# Patient Record
Sex: Female | Born: 1975 | Race: White | Hispanic: No | Smoking: Former smoker
Health system: Southern US, Community
[De-identification: ages and names within clinical notes are randomized; demographics above are authoritative.]

## PROBLEM LIST (undated history)

## (undated) DIAGNOSIS — G4730 Sleep apnea, unspecified: Secondary | ICD-10-CM

## (undated) DIAGNOSIS — M549 Dorsalgia, unspecified: Secondary | ICD-10-CM

## (undated) DIAGNOSIS — F32A Depression, unspecified: Secondary | ICD-10-CM

## (undated) DIAGNOSIS — M419 Scoliosis, unspecified: Secondary | ICD-10-CM

## (undated) DIAGNOSIS — L309 Dermatitis, unspecified: Secondary | ICD-10-CM

## (undated) DIAGNOSIS — F319 Bipolar disorder, unspecified: Secondary | ICD-10-CM

## (undated) DIAGNOSIS — F603 Borderline personality disorder: Secondary | ICD-10-CM

## (undated) DIAGNOSIS — K589 Irritable bowel syndrome without diarrhea: Secondary | ICD-10-CM

## (undated) DIAGNOSIS — J439 Emphysema, unspecified: Secondary | ICD-10-CM

## (undated) DIAGNOSIS — M1990 Unspecified osteoarthritis, unspecified site: Secondary | ICD-10-CM

## (undated) DIAGNOSIS — M51369 Other intervertebral disc degeneration, lumbar region without mention of lumbar back pain or lower extremity pain: Secondary | ICD-10-CM

## (undated) DIAGNOSIS — K121 Other forms of stomatitis: Secondary | ICD-10-CM

## (undated) DIAGNOSIS — E079 Disorder of thyroid, unspecified: Secondary | ICD-10-CM

## (undated) DIAGNOSIS — M5136 Other intervertebral disc degeneration, lumbar region: Secondary | ICD-10-CM

## (undated) DIAGNOSIS — K2970 Gastritis, unspecified, without bleeding: Secondary | ICD-10-CM

## (undated) DIAGNOSIS — F419 Anxiety disorder, unspecified: Secondary | ICD-10-CM

## (undated) DIAGNOSIS — E538 Deficiency of other specified B group vitamins: Secondary | ICD-10-CM

## (undated) DIAGNOSIS — I10 Essential (primary) hypertension: Secondary | ICD-10-CM

## (undated) DIAGNOSIS — K219 Gastro-esophageal reflux disease without esophagitis: Secondary | ICD-10-CM

## (undated) DIAGNOSIS — M722 Plantar fascial fibromatosis: Secondary | ICD-10-CM

## (undated) DIAGNOSIS — G5600 Carpal tunnel syndrome, unspecified upper limb: Secondary | ICD-10-CM

## (undated) DIAGNOSIS — D649 Anemia, unspecified: Secondary | ICD-10-CM

## (undated) DIAGNOSIS — M542 Cervicalgia: Secondary | ICD-10-CM

## (undated) DIAGNOSIS — Z8719 Personal history of other diseases of the digestive system: Secondary | ICD-10-CM

## (undated) DIAGNOSIS — E039 Hypothyroidism, unspecified: Secondary | ICD-10-CM

## (undated) DIAGNOSIS — F329 Major depressive disorder, single episode, unspecified: Secondary | ICD-10-CM

## (undated) DIAGNOSIS — N393 Stress incontinence (female) (male): Secondary | ICD-10-CM

## (undated) HISTORY — DX: Essential (primary) hypertension: I10

## (undated) HISTORY — PX: INTRAUTERINE DEVICE (IUD) INSERTION: SHX5877

## (undated) HISTORY — DX: Gastro-esophageal reflux disease without esophagitis: K21.9

## (undated) HISTORY — DX: Depression, unspecified: F32.A

## (undated) HISTORY — DX: Anemia, unspecified: D64.9

## (undated) HISTORY — DX: Other intervertebral disc degeneration, lumbar region without mention of lumbar back pain or lower extremity pain: M51.369

## (undated) HISTORY — PX: TUBAL LIGATION: SHX77

## (undated) HISTORY — DX: Other intervertebral disc degeneration, lumbar region: M51.36

## (undated) HISTORY — DX: Plantar fascial fibromatosis: M72.2

## (undated) HISTORY — DX: Major depressive disorder, single episode, unspecified: F32.9

## (undated) HISTORY — PX: FOOT SURGERY: SHX648

## (undated) HISTORY — DX: Emphysema, unspecified: J43.9

## (undated) HISTORY — DX: Unspecified osteoarthritis, unspecified site: M19.90

## (undated) HISTORY — PX: HIP FRACTURE SURGERY: SHX118

## (undated) HISTORY — DX: Disorder of thyroid, unspecified: E07.9

---

## 2004-04-17 ENCOUNTER — Emergency Department: Payer: Self-pay | Admitting: Emergency Medicine

## 2004-04-25 ENCOUNTER — Emergency Department: Payer: Self-pay | Admitting: Unknown Physician Specialty

## 2004-06-04 ENCOUNTER — Emergency Department: Payer: Self-pay | Admitting: Emergency Medicine

## 2004-09-12 ENCOUNTER — Emergency Department: Payer: Self-pay | Admitting: General Practice

## 2005-09-06 HISTORY — PX: WISDOM TOOTH EXTRACTION: SHX21

## 2006-05-27 ENCOUNTER — Emergency Department: Payer: Self-pay | Admitting: Emergency Medicine

## 2006-11-26 ENCOUNTER — Other Ambulatory Visit: Payer: Self-pay

## 2006-11-27 ENCOUNTER — Inpatient Hospital Stay: Payer: Self-pay | Admitting: Internal Medicine

## 2006-11-28 ENCOUNTER — Inpatient Hospital Stay: Payer: Self-pay | Admitting: Psychiatry

## 2007-01-29 ENCOUNTER — Emergency Department: Payer: Self-pay | Admitting: Internal Medicine

## 2007-03-21 ENCOUNTER — Emergency Department: Payer: Self-pay | Admitting: Emergency Medicine

## 2007-05-26 ENCOUNTER — Ambulatory Visit: Payer: Self-pay

## 2007-10-06 ENCOUNTER — Encounter: Payer: Self-pay | Admitting: Podiatry

## 2007-10-07 ENCOUNTER — Encounter: Payer: Self-pay | Admitting: Podiatry

## 2007-11-07 ENCOUNTER — Encounter: Payer: Self-pay | Admitting: Podiatry

## 2007-12-07 ENCOUNTER — Encounter: Payer: Self-pay | Admitting: Podiatry

## 2009-07-11 ENCOUNTER — Emergency Department: Payer: Self-pay | Admitting: Unknown Physician Specialty

## 2009-09-02 ENCOUNTER — Other Ambulatory Visit: Payer: Self-pay | Admitting: Family Medicine

## 2010-06-04 ENCOUNTER — Encounter: Payer: Self-pay | Admitting: Podiatry

## 2010-06-07 ENCOUNTER — Encounter: Payer: Self-pay | Admitting: Podiatry

## 2010-07-07 ENCOUNTER — Encounter: Payer: Self-pay | Admitting: Podiatry

## 2010-08-07 ENCOUNTER — Encounter: Payer: Self-pay | Admitting: Podiatry

## 2010-09-07 ENCOUNTER — Encounter: Payer: Self-pay | Admitting: Podiatry

## 2010-10-15 ENCOUNTER — Ambulatory Visit: Payer: Self-pay | Admitting: Pain Medicine

## 2010-10-21 ENCOUNTER — Ambulatory Visit: Payer: Self-pay | Admitting: Pain Medicine

## 2010-10-23 ENCOUNTER — Ambulatory Visit: Payer: Self-pay | Admitting: Pain Medicine

## 2010-11-04 ENCOUNTER — Ambulatory Visit: Payer: Self-pay | Admitting: Pain Medicine

## 2010-11-14 ENCOUNTER — Ambulatory Visit: Payer: Self-pay | Admitting: Pain Medicine

## 2010-11-25 ENCOUNTER — Ambulatory Visit: Payer: Self-pay | Admitting: Pain Medicine

## 2010-12-11 ENCOUNTER — Ambulatory Visit: Payer: Self-pay | Admitting: Pain Medicine

## 2010-12-23 ENCOUNTER — Ambulatory Visit: Payer: Self-pay | Admitting: Pain Medicine

## 2011-01-27 ENCOUNTER — Ambulatory Visit: Payer: Self-pay | Admitting: Pain Medicine

## 2011-02-05 ENCOUNTER — Ambulatory Visit: Payer: Self-pay | Admitting: Pain Medicine

## 2011-02-24 ENCOUNTER — Ambulatory Visit: Payer: Self-pay | Admitting: Pain Medicine

## 2011-12-22 ENCOUNTER — Inpatient Hospital Stay: Payer: Self-pay | Admitting: Psychiatry

## 2011-12-22 LAB — URINALYSIS, COMPLETE
Bilirubin,UR: NEGATIVE
Blood: NEGATIVE
Glucose,UR: NEGATIVE mg/dL (ref 0–75)
Granular Cast: 20
Hyaline Cast: 1
Nitrite: NEGATIVE
Ph: 5 (ref 4.5–8.0)
Protein: 30
RBC,UR: 1 /HPF (ref 0–5)
Specific Gravity: 1.01 (ref 1.003–1.030)
Squamous Epithelial: 5
WBC UR: 4 /HPF (ref 0–5)

## 2011-12-22 LAB — ETHANOL
Ethanol %: <0.003 % (ref 0.000–0.080)
Ethanol: <3 mg/dL

## 2011-12-22 LAB — COMPREHENSIVE METABOLIC PANEL
Albumin: 4.5 g/dL (ref 3.4–5.0)
Alkaline Phosphatase: 101 U/L (ref 50–136)
Anion Gap: 6 — ABNORMAL LOW (ref 7–16)
BUN: 6 mg/dL — ABNORMAL LOW (ref 7–18)
Bilirubin,Total: 0.8 mg/dL (ref 0.2–1.0)
Calcium, Total: 9.6 mg/dL (ref 8.5–10.1)
Chloride: 101 mmol/L (ref 98–107)
Co2: 29 mmol/L (ref 21–32)
Creatinine: 0.83 mg/dL (ref 0.60–1.30)
EGFR (African American): >60
EGFR (Non-African Amer.): >60
Glucose: 90 mg/dL (ref 65–99)
Osmolality: 269 (ref 275–301)
Potassium: 4.5 mmol/L (ref 3.5–5.1)
SGOT(AST): 27 U/L (ref 15–37)
SGPT (ALT): 19 U/L (ref 12–78)
Sodium: 136 mmol/L (ref 136–145)
Total Protein: 8.2 g/dL (ref 6.4–8.2)

## 2011-12-22 LAB — DRUG SCREEN, URINE
Amphetamines, Ur Screen: POSITIVE (ref ?–1000)
Barbiturates, Ur Screen: NEGATIVE (ref ?–200)
Benzodiazepine, Ur Scrn: NEGATIVE (ref ?–200)
Cannabinoid 50 Ng, Ur ~~LOC~~: NEGATIVE (ref ?–50)
Cocaine Metabolite,Ur ~~LOC~~: NEGATIVE (ref ?–300)
MDMA (Ecstasy)Ur Screen: POSITIVE (ref ?–500)
Methadone, Ur Screen: NEGATIVE (ref ?–300)
Opiate, Ur Screen: NEGATIVE (ref ?–300)
Phencyclidine (PCP) Ur S: NEGATIVE (ref ?–25)
Tricyclic, Ur Screen: NEGATIVE (ref ?–1000)

## 2011-12-22 LAB — CBC
HCT: 41.8 % (ref 35.0–47.0)
HGB: 14.5 g/dL (ref 12.0–16.0)
MCH: 37.1 pg — ABNORMAL HIGH (ref 26.0–34.0)
MCHC: 34.8 g/dL (ref 32.0–36.0)
MCV: 107 fL — ABNORMAL HIGH (ref 80–100)
Platelet: 396 10*3/uL (ref 150–440)
RBC: 3.92 10*6/uL (ref 3.80–5.20)
RDW: 17.1 % — ABNORMAL HIGH (ref 11.5–14.5)
WBC: 10.5 10*3/uL (ref 3.6–11.0)

## 2011-12-22 LAB — TSH: Thyroid Stimulating Horm: 1.53 u[IU]/mL

## 2012-08-17 ENCOUNTER — Ambulatory Visit: Payer: Self-pay | Admitting: Pain Medicine

## 2012-10-28 ENCOUNTER — Emergency Department: Payer: Self-pay | Admitting: Internal Medicine

## 2012-11-17 ENCOUNTER — Ambulatory Visit: Payer: Self-pay | Admitting: Pain Medicine

## 2013-01-27 ENCOUNTER — Ambulatory Visit: Payer: Self-pay | Admitting: Pain Medicine

## 2013-02-02 ENCOUNTER — Ambulatory Visit: Payer: Self-pay | Admitting: Pain Medicine

## 2013-03-08 ENCOUNTER — Ambulatory Visit: Payer: Self-pay | Admitting: Pain Medicine

## 2013-03-16 ENCOUNTER — Ambulatory Visit: Payer: Self-pay | Admitting: Pain Medicine

## 2013-04-19 ENCOUNTER — Ambulatory Visit: Payer: Self-pay | Admitting: Pain Medicine

## 2013-04-23 ENCOUNTER — Emergency Department: Payer: Self-pay | Admitting: Emergency Medicine

## 2013-05-04 ENCOUNTER — Ambulatory Visit: Payer: Self-pay | Admitting: Pain Medicine

## 2013-05-31 ENCOUNTER — Ambulatory Visit: Payer: Self-pay | Admitting: Pain Medicine

## 2013-12-19 ENCOUNTER — Ambulatory Visit: Payer: Self-pay | Admitting: Family Medicine

## 2014-01-02 DIAGNOSIS — N393 Stress incontinence (female) (male): Secondary | ICD-10-CM | POA: Diagnosis present

## 2014-03-18 ENCOUNTER — Ambulatory Visit: Payer: Self-pay | Admitting: Nurse Practitioner

## 2014-03-23 ENCOUNTER — Emergency Department: Payer: Self-pay | Admitting: Emergency Medicine

## 2014-03-30 ENCOUNTER — Inpatient Hospital Stay: Payer: Self-pay | Admitting: Surgery

## 2014-03-30 LAB — HCG, QUANTITATIVE, PREGNANCY: Beta Hcg, Quant.: <1 m[IU]/mL — ABNORMAL LOW

## 2014-03-30 LAB — BASIC METABOLIC PANEL
Anion Gap: 8 (ref 7–16)
BUN: 11 mg/dL
Calcium, Total: 8.8 mg/dL — ABNORMAL LOW
Chloride: 105 mmol/L
Co2: 27 mmol/L
Creatinine: 0.78 mg/dL
EGFR (African American): >60
EGFR (Non-African Amer.): >60
Glucose: 92 mg/dL
Potassium: 3.5 mmol/L
Sodium: 140 mmol/L

## 2014-03-30 LAB — SEDIMENTATION RATE: Erythrocyte Sed Rate: 28 mm/hr — ABNORMAL HIGH (ref 0–20)

## 2014-03-30 LAB — MAGNESIUM: Magnesium: 1.8 mg/dL

## 2014-03-31 LAB — BASIC METABOLIC PANEL WITH GFR
Anion Gap: 6 — ABNORMAL LOW
BUN: 8 mg/dL
Calcium, Total: 7.9 mg/dL — ABNORMAL LOW
Chloride: 107 mmol/L
Co2: 24 mmol/L
Creatinine: 0.68 mg/dL
EGFR (African American): >60
EGFR (Non-African Amer.): >60
Glucose: 123 mg/dL — ABNORMAL HIGH
Potassium: 4.3 mmol/L
Sodium: 137 mmol/L

## 2014-03-31 LAB — CBC WITH DIFFERENTIAL/PLATELET
Basophil #: 0 10*3/uL (ref 0.0–0.1)
Basophil %: 0.2 %
Eosinophil #: 0 10*3/uL (ref 0.0–0.7)
Eosinophil %: 0 %
HCT: 32.4 % — ABNORMAL LOW (ref 35.0–47.0)
HGB: 10.6 g/dL — ABNORMAL LOW (ref 12.0–16.0)
Lymphocyte #: 1 10*3/uL (ref 1.0–3.6)
Lymphocyte %: 9 %
MCH: 29.2 pg (ref 26.0–34.0)
MCHC: 32.8 g/dL (ref 32.0–36.0)
MCV: 89 fL (ref 80–100)
Monocyte #: 0.7 x10 3/mm (ref 0.2–0.9)
Monocyte %: 6.9 %
Neutrophil #: 9.1 10*3/uL — ABNORMAL HIGH (ref 1.4–6.5)
Neutrophil %: 83.9 %
Platelet: 374 10*3/uL (ref 150–440)
RBC: 3.64 10*6/uL — ABNORMAL LOW (ref 3.80–5.20)
RDW: 13.3 % (ref 11.5–14.5)
WBC: 10.8 10*3/uL (ref 3.6–11.0)

## 2014-04-01 LAB — CBC WITH DIFFERENTIAL/PLATELET
Basophil #: 0 10*3/uL (ref 0.0–0.1)
Basophil %: 0.5 %
Eosinophil #: 0.1 10*3/uL (ref 0.0–0.7)
Eosinophil %: 0.7 %
HCT: 30.1 % — ABNORMAL LOW (ref 35.0–47.0)
HGB: 9.7 g/dL — ABNORMAL LOW (ref 12.0–16.0)
Lymphocyte #: 2.9 10*3/uL (ref 1.0–3.6)
Lymphocyte %: 35.2 %
MCH: 28.7 pg (ref 26.0–34.0)
MCHC: 32.1 g/dL (ref 32.0–36.0)
MCV: 89 fL (ref 80–100)
Monocyte #: 0.8 x10 3/mm (ref 0.2–0.9)
Monocyte %: 10.2 %
Neutrophil #: 4.4 10*3/uL (ref 1.4–6.5)
Neutrophil %: 53.4 %
Platelet: 312 10*3/uL (ref 150–440)
RBC: 3.37 10*6/uL — ABNORMAL LOW (ref 3.80–5.20)
RDW: 13.4 % (ref 11.5–14.5)
WBC: 8.2 10*3/uL (ref 3.6–11.0)

## 2014-04-01 LAB — BASIC METABOLIC PANEL
Anion Gap: 2 — ABNORMAL LOW (ref 7–16)
BUN: 8 mg/dL
Calcium, Total: 7.9 mg/dL — ABNORMAL LOW
Chloride: 112 mmol/L — ABNORMAL HIGH
Co2: 27 mmol/L
Creatinine: 0.66 mg/dL
EGFR (African American): >60
EGFR (Non-African Amer.): >60
Glucose: 94 mg/dL
Potassium: 4.3 mmol/L
Sodium: 141 mmol/L

## 2014-04-02 LAB — HEMOGLOBIN: HGB: 9.4 g/dL — ABNORMAL LOW (ref 12.0–16.0)

## 2014-04-25 NOTE — H&P (Signed)
PATIENT NAME:  Kirsten Richardson, Kirsten Richardson MR#:  [id number] DATE OF BIRTH:  [birth year]  DATE OF ADMISSION:  12/22/2011  REFERRING PHYSICIAN:  Daryel November, MD   ATTENDING PHYSICIAN:  Kristine Linea, MD   IDENTIFYING DATA:  The patient is a 39 year old female with history of depression and chronic pain.   CHIEF COMPLAINT:  "My depression is worse."   HISTORY OF PRESENT ILLNESS:  The patient has been a patient at [facility]. She has been maintained on a combination of Wellbutrin and Cymbalta. She was doing fine, but in the past few months, she has learned that [facility] will close their outpatient services and she lost her trusted therapist and the psychiatrist. She became increasingly depressed. Three months ago, she reports overdosing on medication, but was not been hospitalized at that time. Over the past several weeks, she has been thinking about suicide frequently and decided to come to the hospital. The patient has multiple medical problems in addition to her psychiatric problems. Unfortunately, she lost Medicaid in March. She is separated from her husband and they have 50-50 custody of the children. Neither of the parents therefore qualifies for Medicaid. With the loss of Medicaid, she is no longer able to see her pain doctor. She has 2 kinds of pain, back pain from arthritis and disc change as well as plantar fasciitis for which she had surgery that went wrong and the patient has been in pain ever since and unable to function. In the past, she received the injections for her back pain and narcotic pain killers for feet pain. She has not seen her pain doctor in many months. It is unclear to me how she is able to pay for her multiple psychiatric medications. She reports poor sleep, decreased appetite, anhedonia, feeling of guilt, hopelessness, worthlessness, crying spells, irritability, poor memory and concentration, social isolation. She is chronically suicidal, but lately started thinking about  overdosing on pills. She denies alcohol, illicit drugs or prescription pill abuse. She denies psychotic symptoms or symptoms suggestive of bipolar mania.   PAST PSYCHIATRIC HISTORY:  She has not been hospitalized. There was 1 suicide attempt at the age of 35. No substance abuse treatments.   FAMILY PSYCHIATRIC HISTORY:  Mother and father with anxiety. Her nephew committed suicide.   ALLERGIES:  No known drug allergies.   MEDICATIONS ON ADMISSION:   1.  Wellbutrin 450 mg daily.  2.  Trazodone 100 mg at bedtime.  3.  Ritalin 10 mg 3 times daily.  4.  Risperdal 2 mg at bedtime.  5.  Lamictal XR 250 mg in the morning.  6.  Cymbalta 120 mg daily.   SOCIAL HISTORY:  She is separated from her husband. She lives by herself. Her brother pays the rent and the other family members pay her bills. She applied for disability and hopes to be independent one day. She has 2 sons, ages 65 and 83. She splits care of the children 50-50 with her husband. She has a Information systems manager. She dropped out of school in the ninth grade. She quit for drugs.   REVIEW OF SYSTEMS:  CONSTITUTIONAL: No fevers or chills. No weight changes. Positive for fatigue.  EYES: No double or blurred vision.  ENT: No hearing loss.  RESPIRATORY: No shortness of breath.  CARDIOVASCULAR: No chest pain or orthopnea.  GASTROINTESTINAL: Positive for abdominal pain, constipation and gas.  GENITOURINARY: No incontinence or frequency.  ENDOCRINE: No heat or cold intolerance.  LYMPHATIC: No anemia or easy bruising.  INTEGUMENTARY:  No acne or rash.  MUSCULOSKELETAL: Positive for feet and back pain.  NEUROLOGIC: No tingling or weakness.  PSYCHIATRIC: See history of present illness for details.   PHYSICAL EXAMINATION: VITAL SIGNS: Blood pressure 121/73, pulse 131, respirations 18, temperature 98.4.  GENERAL: This is a well-developed young female in no acute distress.  HEENT: The pupils are equal, round, and reactive to light. Sclerae are  anicteric.  NECK: Supple. No thyromegaly.  LUNGS: Clear to auscultation. No dullness to percussion.  HEART: Regular rhythm and rate. No murmurs, rubs, or gallops.  ABDOMEN: Soft, nontender, nondistended. Positive bowel sounds.  MUSCULOSKELETAL: Normal muscle strength in all extremities.  SKIN: No rashes or bruises.  LYMPHATIC: No cervical adenopathy.  NEUROLOGIC: Cranial nerves II through XII are intact.   DIAGNOSTIC DATA:  Chemistries are within normal limits. Blood alcohol level is 0. LFTs are within normal limits. TSH is 1.53. Urine drug screen is positive for amphetamines and MDMA. CBC is within normal limits. Urinalysis is not suggestive of urinary tract infection. EKG: Sinus tachycardia, normal EKG.   MENTAL STATUS EXAMINATION ON ADMISSION:  The patient is alert and oriented to person, place, time and situation. She is pleasant, polite and cooperative. There is psychomotor retardation. She is wearing hospital scrubs. She maintains some eye contact. Her speech is soft. Mood is depressed with flat affect. Thought processing is logical and goal oriented. Thought content: She denies suicidal or homicidal ideation, but was admitted for voicing suicidal ideation with a plan to overdose on medications. There are no delusions or paranoia. There are no auditory or visual hallucinations. Her cognition is grossly intact. She registers 3 out of 3 and recalls 3 out of 3 objects after 5 minutes. She can spell world forward and backward. She knows the current president. Her insight and judgment are fair.   SUICIDE RISK ASSESSMENT ON ADMISSION:  This is a patient with history of depression, but recently worsened due to multiple stressors and loss of her trusted providers who has a history of suicide attempt and chronic pain.   DIAGNOSES: AXIS I: Major depressive disorder, recurrent, severe.  AXIS II: Deferred.  AXIS III: Chronic pain.  AXIS IV: Mental illness, financial.  AXIS V: Global assessment of  functioning 25.   PLAN:  The patient was admitted to [facility] Medicine Unit for safety, stabilization and medication management. She was initially placed on suicide precautions and was closely monitored for any unsafe behaviors. She underwent full psychiatric and risk assessment. She received pharmacotherapy, individual and group psychotherapy, substance abuse counseling and support from therapeutic milieu.  1.  Suicidal ideation: This has resolved. The patient is able to contract for safety.  2.  Mood: We will continue medications as prescribed by Dr. Katrinka Blazing, Wellbutrin, Cymbalta and trazodone for sleep.  3.  ADHD: Diagnosis is unclear. I will not prescribed stimulants during current hospitalization.  4.  Chronic pain. We will not prescribe narcotic pain killers as the patient has no provider in the community or insurance. We will offer a tramadol.  5.  Disposition:  She will be discharged to home.    ____________________________ Ellin Goodie. Jennet Maduro, MD jbp:si D: 12/24/2011 20:36:00 ET T: 12/24/2011 22:25:48 ET JOB#: [id number]  cc: Calvyn Kurtzman B. Jennet Maduro, MD, <Dictator> Shari Prows MD ELECTRONICALLY SIGNED 12/26/2011 5:29

## 2014-05-01 LAB — SURGICAL PATHOLOGY

## 2014-05-07 NOTE — Op Note (Signed)
PATIENT NAME:  Kirsten Richardson, Kirsten Richardson MR#:  [id number] DATE OF BIRTH:  [birth year]  DATE OF PROCEDURE:  03/30/2014  PREOPERATIVE DIAGNOSIS: Impending stress fractures, medial femoral calcar regions, both hips.   POSTOPERATIVE DIAGNOSIS: Impending stress fractures, medial femoral calcar regions, both hips.    PROCEDURE: Prophylactic trochanteric femoral nailing of impending stress fractures, bilateral hips.   SURGEON: Maryagnes Amos, MD   ANESTHESIA: General endotracheal.   FINDINGS: As noted above.   COMPLICATIONS: None.   ESTIMATED BLOOD LOSS: 75 mL.   TOTAL FLUIDS: Crystalloid 1500 mL.  URINE OUTPUT: 300 mL.   TOURNIQUET: None.   DRAINS: None.   CLOSURE: Staples.   BRIEF CLINICAL NOTE: The patient is a 39 year old female who has had a 5-6 week history of progressively worsening right groin pain. Her symptoms have progressed despite medications, activity modification, etc. An MRI scan confirmed the presence of a stress reaction with impending fracture involving the medial calcar region of the right hip. The MRI also showed some increased uptake in the same region on the left side. Over the past week or so, she has had worsening pain in her left hip to where she is now unable to bear weight or ambulate, and has been confined to a wheelchair. She presents at this time for a bilateral prophylactic trochanter femoral nailing of  both hips.   PROCEDURE IN DETAIL: The patient was brought into the operating room and lain in the supine position. After adequate general endotracheal intubation and anesthesia were obtained, the patient was transferred to the fracture table and a Foley catheter inserted. The right side was approached first. The patient was positioned so that the left leg was placed in a flexed and abducted position over the well leg holder. The right lower extremity was placed in gentle longitudinal traction with some internal rotation to optimize position of the femur. The adequacy of  the proximal femoral position was verified fluoroscopically in AP and lateral projections and found to be excellent. The lateral aspect of the right hip and thigh was prepped with ChloraPrep solution before being draped sterilely. Preoperative antibiotics were administered. An approximately 3-4 cm incision was made over the lateral aspect of the hip approximately 3 fingerbreadths above the tip of the lesser trochanter. The incision was bluntly dissected down through the subcutaneous tissues to expose the gluteal fascia. This was split the length of the incision. The dissection was carried down further, splitting the muscles in line of their fibers. The deeper tendon was identified and bluntly penetrated to provide access to the tip of the greater trochanter. Under fluoroscopic guidance, the 2.5 mm drill was positioned and, again, after verifying its position fluoroscopically in AP and lateral projections, was inserted to just below the lesser trochanter. Once this position was, again, verified fluoroscopically in AP and lateral projections, it was overreamed with the proximal step reamer. The beaded guidewire was passed down through the femoral canal to rest within several centimeters of the distal femoral joint. The guidewire was measured to determine the optimal length for the nail. A 340 mm nail was selected. The canal was reamed sequentially beginning with a 9 mm flexible reamer. This was progressed by 0.5 mm increments to 10.5 mm. This provided excellent cortical chatter, so it was elected to proceed with placement of a 9 x 340 mm trochanteric femoral nail. This was impacted into place and advanced to the appropriate depth, as verified by the lag screw position. Once this position was verified on fluoroscopic imaging in  AP and lateral projections, the triple sleeve construct was placed through the guide, and advanced through the soft tissues via a 2-3 cm stab incision to rest along the lateral aspect of the  proximal femur. Again, once this position was verified fluoroscopically, the guidewire was drilled up through the femoral neck to rest within 5 mm of subchondral bone. Again, the guidewire position was verified fluoroscopically in AP and lateral projections and found to be excellent. The guide wire was measured and then overreamed to the appropriate depth of 85 mm. The hole was tapped appropriately before the 85 mm lag screw was inserted and advanced to the appropriate depth. The proximal locking screw was tightened and then backed off a quarter turn. The adequacy of the hardware position in the proximal femur, again, was verified fluoroscopically in AP and lateral projections and found to be excellent.   Attention was directed to the distal femur. The leg and fluoroscopic unit were positioned so that the static interlocking hole of the nail was nice and round. A short stab incision was made over this area and the drill placed over the lateral aspect of the distal femoral cortex. Once the optimal position was verified, the drill was advanced through the nail to exit through the medial cortex. A 36 mm screw was selected and inserted with care taken to be sure it was in the proper position, again, using fluoroscopy in AP and lateral projections.   Once this was verified, all wounds were copiously irrigated with sterile saline solution. The proximal wound was closed using #0 Vicryl interrupted sutures for the deeper subcutaneous tissues, whereas the superficial subcutaneous tissues were closed using 2-0 Vicryl interrupted sutures in all 3 wounds. Staples were used to close the skin in all 3 wounds. Sterile occlusive dressings were applied to all 3 wounds.   The drapes were taken down and the patient repositioned so that the left leg was placed in slight longitudinal traction with some internal rotation whereas the right leg was placed in a flexed and abducted position over the well leg holder. Again, the adequacy  of femur position was verified fluoroscopically in AP and lateral projections and found to be excellent. A 3-4 cm incision was made 3 fingerbreadths above the greater trochanter. Again, the incision was carried down through the subcutaneous tissues to access the gluteal fascia. This was split the length of the incision and further dissection carried down through the muscles, divided in lines of their fibers to provide access to the tip of the greater trochanter. Again, the guidewire was positioned appropriately using fluoroscopic imaging in AP and lateral projections before being advanced into the proximal femur to rest just below the lesser trochanter. It was overreamed using the appropriate reamer to the appropriate depth, again, as verified fluoroscopically in AP and lateral projections. The beaded guidewire was passed down through the femoral canal to rest in the distal femur. After verifying its position fluoroscopically in AP and lateral projections, the guidewire was measured and a second 340 mm nail selected. The canal was reamed sequentially beginning with a 9 mm flexible reamer progressing to an 11 mm flexible reamer. This again provided excellent cortical chatter, so the 9 x 340 mm nail was selected. It was impacted into place and to the appropriate depth, as verified fluoroscopically in AP and lateral projections. The proximal lag screw was positioned by using the 3 sleeves inserted through a stab incision. The guidewire was advanced first through the femoral neck into the femoral head  to rest within 5 mm of subchondral bone. Once its position was verified fluoroscopically in AP and lateral projections, it was measured then overreamed to the appropriate depth before finally being tapped. A second 85 mm lag screw was advanced to the appropriate depth and locked with the proximal locking screw in the nail. This too was backed off a quarter turn. Again, the adequacy of hardware position in the proximal  femur was verified fluoroscopically in AP and lateral projections and found to be excellent.   Attention was directed distally. The leg and fluoroscopy units were positioned so that a perfect circle could be identified in the static locking hole of the intramedullary nail. A stab incision was made over the lateral aspect of the thigh in the appropriate position before the drill was positioned and then advanced through the nail, again, as verified fluoroscopically in AP and lateral projections. A screw of the appropriate length was selected and inserted, then tightened securely. Again, the adequacy of screw position was verified fluoroscopically in AP and lateral projections and found to be excellent.   The three wounds on the left thigh and hip were irrigated thoroughly with sterile saline solution. The deeper layer layers of the proximal wound were closed using #0 Vicryl interrupted sutures. The subcutaneous tissues of all three wounds were closed using 2-0 Vicryl interrupted sutures. The skin was closed using staples. Again, sterile occlusive dressings were applied to all wounds. Of note, 30 mL of 0.5% Sensorcaine with epinephrine was injected into each of the 3 wounds on each leg in order to help with postoperative analgesia. The patient was then transferred back to her bed, where she was awakened, extubated, and returned to the recovery room in satisfactory condition after tolerating the procedure well.    ____________________________ J. Derald Macleod, MD jjp:bm D: 03/30/2014 15:40:42 ET T: 03/31/2014 01:32:44 ET JOB#: [id number]  cc: Maryagnes Amos, MD, <Dictator> Maryagnes Amos MD ELECTRONICALLY SIGNED 04/04/2014 16:07

## 2014-05-07 NOTE — Discharge Summary (Signed)
PATIENT NAME:  Kirsten Richardson, SIVILS MR#:  [id number] DATE OF BIRTH:  Sep 12, 1975  DATE OF ADMISSION:  03/30/2014 DATE OF DISCHARGE: 04/02/2014   ADMITTING DIAGNOSIS: Impending stress fractures, medial femoral calcar regions of both hips.   DISCHARGE DIAGNOSIS: Impending stress fractures, medial femoral calcar region, both hips.   PROCEDURE: Prophylactic trochanteric femoral nailing of impending stress fractures, bilateral hips.   SURGEON: Derald Macleod, MD.   ANESTHESIA: General.   FINDINGS: As noted above.   COMPLICATIONS: None.   ESTIMATED BLOOD LOSS: 55 mL.   TOTAL FLUIDS: Crystalloid 1500 mL.   URINE OUTPUT: 300 mL.   TOURNIQUET: None.   DRAINS: None.   CLOSURE: Staples.   HISTORY: The patient is a 39 year old female who was has a several week history of progressive worsening right groin pain. Her symptoms had progressed despite medications and activity modification. An MRI scan confirmed presence of stress reaction with impending fracture involving the medial calcar region of the right hip. The MRI also showed some increased uptake in the same region on the left side. Over the past week or so, she has had worsening pain in her left hip to where she is now unable to bear weight or ambulate and has been confined to a wheelchair. She presents at this time for bilateral prophylactic trochanteric femoral nailing of both hips.   PHYSICAL EXAMINATION: GENERAL: Well-developed, well-nourished female in no apparent distress.  NEUROLOGIC: Normal mood, affect. Oriented to person, place and time.  EYES: Nonicteric.  ENT: Unremarkable.  LYMPHATIC: No palpable adenopathy.  RESPIRATORY: Lungs clear to auscultation. Normal chest excursion. No wheezes. Nonlabored breathing.  CARDIOVASCULAR: Regular rate and rhythm.  INTEGUMENTARY: No impressive skin lesions present.  MUSCULOSKELETAL: Unremarkable.  SKIN: Normal.  EXTREMITIES: The patient stands with a walker. She presents in a wheelchair. She  has tenderness along the lower spine. She has moderate pain with limited hip range of motion bilaterally. She is neurovascularly intact in both lower extremities and had negative sitting straight leg raises bilaterally.   HOSPITAL COURSE: The patient was admitted to the hospital on 03/30/2014. She had surgery that same day and was brought to the orthopedic floor from the PACU in stable condition. On postoperative day 1, the patient had an acute postoperative blood loss anemia with hemoglobin 10.6. On postoperative day 2, hemoglobin was down to 9.7. Other labs were stable. Vital signs were stable. Pain control was not adequately controlled the first 2 days. She was in severe 10/10 pain, although she did not appear to be in this amount of pain as she would have normal conversations with no indication of severe pain. The patient was given OxyContin 10 mg added to her pain regimen of oxycodone and tramadol. Pain was improved slightly. The patient did make good progress with physical therapy. On postoperative day 3, on 04/02/2014, the patient was stable and ready for discharge to home with home health PT.   CONDITION AT DISCHARGE: Stable.   DISCHARGE INSTRUCTIONS: The patient may increase weight-bearing on the affected extremity. Elevate the affected foot or leg on 1 or 2 pillows with the foot higher than the knee. Thigh-high TED hose on both legs and remove at bedtime and replace on arising the next morning. Elevate the heels off of the bed. Incentive spirometer every hour while awake. Encourage cough and deep breathing. The patient may resume a regular diet as tolerated. Apply an ice pack to the affected area. Do not get the dressing or bandage wet or dirty. Call [facility]  if the dressing gets water under it, leave the dressing on, bright red bleeding from the incision wound, fever above 100.5 degrees, redness, swelling or drainage at the incision site. Call [facility] if you  experience any increased leg pain, numbness or weakness in the legs or bowel or bladder symptoms. Home physical therapy has been arranged for continuation of rehab program. Please call [facility] if a therapist has not contacted you within 48 hours of your return home. Please follow up with [facility] in 2 weeks.   DISCHARGE MEDICATIONS: Please see list of discharge medications from discharge instructions.    ____________________________ T. Cranston Neighbor, PA-C tcg:TT D: 04/02/2014 00:19:32 ET T: 04/02/2014 13:13:13 ET JOB#: [id number]  cc: T. Cranston Neighbor, PA-C, <Dictator> Evon Slack [place] ELECTRONICALLY SIGNED 04/10/2014 15:41

## 2014-09-27 ENCOUNTER — Ambulatory Visit: Payer: Commercial Managed Care - HMO | Attending: Anesthesiology | Admitting: Anesthesiology

## 2014-09-27 ENCOUNTER — Encounter: Payer: Self-pay | Admitting: Anesthesiology

## 2014-09-27 VITALS — BP 135/77 | HR 84 | Temp 98.4°F | Resp 16 | Ht 60.0 in | Wt 170.0 lb

## 2014-09-27 DIAGNOSIS — M25551 Pain in right hip: Secondary | ICD-10-CM | POA: Diagnosis present

## 2014-09-27 DIAGNOSIS — M722 Plantar fascial fibromatosis: Secondary | ICD-10-CM | POA: Insufficient documentation

## 2014-09-27 DIAGNOSIS — M25552 Pain in left hip: Secondary | ICD-10-CM

## 2014-09-27 DIAGNOSIS — G90522 Complex regional pain syndrome I of left lower limb: Secondary | ICD-10-CM

## 2014-09-27 DIAGNOSIS — M419 Scoliosis, unspecified: Secondary | ICD-10-CM | POA: Insufficient documentation

## 2014-09-27 DIAGNOSIS — M1612 Unilateral primary osteoarthritis, left hip: Secondary | ICD-10-CM

## 2014-09-27 DIAGNOSIS — M25559 Pain in unspecified hip: Secondary | ICD-10-CM

## 2014-09-27 DIAGNOSIS — F603 Borderline personality disorder: Secondary | ICD-10-CM | POA: Diagnosis not present

## 2014-09-27 DIAGNOSIS — M25572 Pain in left ankle and joints of left foot: Secondary | ICD-10-CM | POA: Diagnosis not present

## 2014-09-27 DIAGNOSIS — F310 Bipolar disorder, current episode hypomanic: Secondary | ICD-10-CM

## 2014-09-27 DIAGNOSIS — F319 Bipolar disorder, unspecified: Secondary | ICD-10-CM | POA: Insufficient documentation

## 2014-09-27 DIAGNOSIS — G8929 Other chronic pain: Secondary | ICD-10-CM

## 2014-09-27 DIAGNOSIS — Q675 Congenital deformity of spine: Secondary | ICD-10-CM

## 2014-09-27 NOTE — Progress Notes (Signed)
Safety precautions to be maintained throughout the outpatient stay will include: orient to surroundings, keep bed in low position, maintain call bell within reach at all times, provide assistance with transfer out of bed and ambulation.  
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
## 2014-09-27 NOTE — Patient Instructions (Signed)
Lumbar Sympathetic Block Patient Information  Description: The lumbar plexus is a group of nerves that are part of the sympathetic nervous system.  These nerves supply organs in the pelvis and legs.  Lumbar sympathetic blocks are utilized for the diagnosis and treatment of painful conditions in these areas.   The lumbar plexus is located on both sides of the aorta at approximately the level of the second lumbar vertebral body.  The block will be performed with you lying on your abdomen with a pillow underneath.  Using direct x-ray guidance,   The plexus will be located on both sides of the spine.  Numbing medicine will be used to deaden the skin prior to needle insertion.  In most cases, a small amount of sedation can be give by IV prior to the numbing medicine.  One or two small needles will be placed near the plexus and local anesthetic will be injected.  This may make your leg(s) feel warm.  The Entire block usually lasts about 15-25 minutes.  Conditions which may be treated by lumbar sympathetic block:   Reflex sympathetic dystrophy  Phantom limb pain  Peripheral neuropathy  Peripheral vascular disease ( inadequate blood flow )  Cancer pain of pelvis, leg and kidney  Preparation for the injection:  1. Do note eat any solid food or diary products within 6 hours of your appointment. 2. You may drink clear liquids up to 2 hours before appointment.  Clear liquids include water, black coffee, juice or soda.  No milk or cream please. 3. You may take your regular medication, including pain medications, with a sip of water before you appointment.  Diabetics should hold regular insulin ( if taken separately ) and take 1/2 NPH dose the morning of the procedure .  Carry some sugar containing items with you to your appointment. 4. A driver must accompany you and be prepared to drive you home after your procedure. 5. Bring all your current medication with you. 6. An IV may be inserted and sedation  may be given at the discretion of the physician.  7. A blood pressure cuff, EKG and other monitors will often be applied during the procedure.  Some patients may need to have extra oxygen administered for a short period. 8. You will be asked to provide medical information, including your allergies and medications, prior to the procedure.  We must know immediately if your taking blood thinners (like Coumadin/Warfarin) or if you are allergic to IV iodine contrast (dye).  We must know if you could possibly be pregnant.  Possible side-effects   Bleeding from needle site or deeper  Infection (rare, can require surgery)  Nerve injury (rare)  Numbness & tingling (temporary)  Collapsed lung (rare)  Spinal headache (a headache worse with upright posture)  Light-headedness (temporary)  Pain at injection site (several days)  Decreased blood pressure (temporary)  Weakness in legs (temporary)  Seizure or other drug reaction (rare)  Call if you experience:   Fever/chills associated with headache or increased back/ neck pain  Headache worsened by an upright position  New onset weakness or numbness of an extremity below the injection site  Hives or difficulty breathing ( go to the emergency room)  Inflammation or drainage at the injections site(s)  New symptoms which are concerning to you  Please note:  If effective, we will often do a series of 2-3 injections spaced 3-6 weeks apart to maximally decrease your pain.  If initial series is effective, you may be   a candidate for a more permanent block of the lumbar sympathetic plexus.  If you have any questions please call [phone] [facility]  

## 2014-10-06 ENCOUNTER — Telehealth: Payer: Self-pay | Admitting: Anesthesiology

## 2014-10-06 NOTE — Telephone Encounter (Signed)
Pt advised she need to come for appt to discuss this with Dr. Starling Manns. Given appt of Wed, Oct 5 at 0945

## 2014-10-06 NOTE — Telephone Encounter (Signed)
Pt had first apt with Dr Starling Manns on 9/14 , pt states that her PCP wants Dr Starling Manns to start prescribing her meds and she will be out of tramadol soon

## 2014-10-11 ENCOUNTER — Encounter: Payer: Self-pay | Admitting: Anesthesiology

## 2014-10-11 ENCOUNTER — Ambulatory Visit: Payer: Commercial Managed Care - HMO | Attending: Anesthesiology | Admitting: Anesthesiology

## 2014-10-11 VITALS — BP 128/81 | HR 95 | Temp 98.5°F | Resp 18 | Ht 60.0 in | Wt 170.0 lb

## 2014-10-11 DIAGNOSIS — M25572 Pain in left ankle and joints of left foot: Secondary | ICD-10-CM | POA: Diagnosis not present

## 2014-10-11 DIAGNOSIS — M25552 Pain in left hip: Secondary | ICD-10-CM | POA: Diagnosis not present

## 2014-10-11 DIAGNOSIS — G894 Chronic pain syndrome: Secondary | ICD-10-CM | POA: Insufficient documentation

## 2014-10-11 DIAGNOSIS — G8929 Other chronic pain: Secondary | ICD-10-CM | POA: Insufficient documentation

## 2014-10-11 DIAGNOSIS — F319 Bipolar disorder, unspecified: Secondary | ICD-10-CM | POA: Diagnosis not present

## 2014-10-11 DIAGNOSIS — M79605 Pain in left leg: Secondary | ICD-10-CM | POA: Insufficient documentation

## 2014-10-11 DIAGNOSIS — G90522 Complex regional pain syndrome I of left lower limb: Secondary | ICD-10-CM

## 2014-10-11 NOTE — Progress Notes (Signed)
   Subjective:    Patient ID: Kirsten Richardson, female    DOB: 05-21-1975, 39 y.o.   MRN: [id number] This patient returned to the clinic today because of prior approval  difficult from her insurance company. The procedure is now improved and she is scheduled to have the procedure which is a left lumbar sympathetic block this coming Friday She indicates that her pain is excruciating and its interfering with her activities of daily living Her subjective pain intensity rating today is 80% She is continuing to take tramadol as prescribed by her primary care physician I have asked her to get a note from her physician that he would not be prescribing any more medication for her so that I can continue to do the same for She indicates that the pain is not getting better and she is eager to begin having the injections. HPI    Review of Systems  Constitutional: Negative.   HENT: Negative.   Eyes: Negative.   Respiratory: Negative.   Cardiovascular: Negative.   Gastrointestinal: Negative.   Endocrine: Negative.   Genitourinary: Negative.   Musculoskeletal: Positive for myalgias, back pain, joint swelling, arthralgias and gait problem. Negative for neck pain and neck stiffness.  Skin: Negative.   Allergic/Immunologic: Negative.   Neurological: Negative.   Hematological: Negative.   Psychiatric/Behavioral: Negative.        Objective:   Physical Exam  Physical examination revealed a pet a patient who is in some moderate distress Her subjective pain intensity rating is 80%  Her blood pressure is 128/81 mmHg  pulse is 95 bpm Equal and regular Heart sounds 1 and 2 were heard in all areas There were no audible murmurs  Temperature is 98.89F Respirations are 18 breaths per minute SPO2 was 99% Chest is clinically clear   there are no adventitious sound  Abdomen is soft and nontender No palpable organomegaly No significant lymphadenopathy  Pupils are equal and reactive Pale nerves are  intact There are no new neurological or musculoskeletal findings   Assessment 1 chronic left ankle, left leg and left hip pain 2 chronic regional pain syndrome type I 3 bipolar disorder  Plan of management 1 Will plan to perform a left lumbar sympathetic block for her at L2 and L4 2 lumbar vascular to get a letter from her primary care physician that he would no longer be prescribing any opioids for her 3 Will plan to do the procedure in 2 days for her   Established patient  level III   Tod Persia M.D.        Assessment & Plan:

## 2014-10-11 NOTE — Progress Notes (Signed)
Safety precautions to be maintained throughout the outpatient stay will include: orient to surroundings, keep bed in low position, maintain call bell within reach at all times, provide assistance with transfer out of bed and ambulation.  

## 2014-10-11 NOTE — Patient Instructions (Signed)
Lumbar Sympathetic Block Patient Information  Description: The lumbar plexus is a group of nerves that are part of the sympathetic nervous system.  These nerves supply organs in the pelvis and legs.  Lumbar sympathetic blocks are utilized for the diagnosis and treatment of painful conditions in these areas.   The lumbar plexus is located on both sides of the aorta at approximately the level of the second lumbar vertebral body.  The block will be performed with you lying on your abdomen with a pillow underneath.  Using direct x-ray guidance,   The plexus will be located on both sides of the spine.  Numbing medicine will be used to deaden the skin prior to needle insertion.  In most cases, a small amount of sedation can be give by IV prior to the numbing medicine.  One or two small needles will be placed near the plexus and local anesthetic will be injected.  This may make your leg(s) feel warm.  The Entire block usually lasts about 15-25 minutes.  Conditions which may be treated by lumbar sympathetic block:   Reflex sympathetic dystrophy  Phantom limb pain  Peripheral neuropathy  Peripheral vascular disease ( inadequate blood flow )  Cancer pain of pelvis, leg and kidney  Preparation for the injection:  1. Do note eat any solid food or diary products within 6 hours of your appointment. 2. You may drink clear liquids up to 2 hours before appointment.  Clear liquids include water, black coffee, juice or soda.  No milk or cream please. 3. You may take your regular medication, including pain medications, with a sip of water before you appointment.  Diabetics should hold regular insulin ( if taken separately ) and take 1/2 NPH dose the morning of the procedure .  Carry some sugar containing items with you to your appointment. 4. A driver must accompany you and be prepared to drive you home after your procedure. 5. Bring all your current medication with you. 6. An IV may be inserted and sedation  may be given at the discretion of the physician.  7. A blood pressure cuff, EKG and other monitors will often be applied during the procedure.  Some patients may need to have extra oxygen administered for a short period. 8. You will be asked to provide medical information, including your allergies and medications, prior to the procedure.  We must know immediately if your taking blood thinners (like Coumadin/Warfarin) or if you are allergic to IV iodine contrast (dye).  We must know if you could possibly be pregnant.  Possible side-effects   Bleeding from needle site or deeper  Infection (rare, can require surgery)  Nerve injury (rare)  Numbness & tingling (temporary)  Collapsed lung (rare)  Spinal headache (a headache worse with upright posture)  Light-headedness (temporary)  Pain at injection site (several days)  Decreased blood pressure (temporary)  Weakness in legs (temporary)  Seizure or other drug reaction (rare)  Call if you experience:   Fever/chills associated with headache or increased back/ neck pain  Headache worsened by an upright position  New onset weakness or numbness of an extremity below the injection site  Hives or difficulty breathing ( go to the emergency room)  Inflammation or drainage at the injections site(s)  New symptoms which are concerning to you  Please note:  If effective, we will often do a series of 2-3 injections spaced 3-6 weeks apart to maximally decrease your pain.  If initial series is effective, you may be   a candidate for a more permanent block of the lumbar sympathetic plexus.  If you have any questions please call [phone] [facility]  

## 2014-10-13 ENCOUNTER — Encounter: Payer: Self-pay | Admitting: Anesthesiology

## 2014-10-13 ENCOUNTER — Ambulatory Visit: Payer: Commercial Managed Care - HMO | Attending: Anesthesiology | Admitting: Anesthesiology

## 2014-10-13 VITALS — BP 143/78 | HR 84 | Temp 98.7°F | Resp 16 | Ht 60.0 in | Wt 170.0 lb

## 2014-10-13 DIAGNOSIS — G90522 Complex regional pain syndrome I of left lower limb: Secondary | ICD-10-CM | POA: Diagnosis not present

## 2014-10-13 DIAGNOSIS — M25552 Pain in left hip: Secondary | ICD-10-CM | POA: Insufficient documentation

## 2014-10-13 DIAGNOSIS — G8929 Other chronic pain: Secondary | ICD-10-CM

## 2014-10-13 DIAGNOSIS — M25572 Pain in left ankle and joints of left foot: Secondary | ICD-10-CM | POA: Diagnosis not present

## 2014-10-13 MED ORDER — TRIAMCINOLONE ACETONIDE 40 MG/ML IJ SUSP
INTRAMUSCULAR | Status: AC
  Administered 2014-10-13: 11:00:00
  Filled 2014-10-13: qty 1

## 2014-10-13 MED ORDER — FENTANYL CITRATE (PF) 100 MCG/2ML IJ SOLN
INTRAMUSCULAR | Status: AC
  Filled 2014-10-13: qty 2

## 2014-10-13 MED ORDER — BUPIVACAINE HCL (PF) 0.25 % IJ SOLN
INTRAMUSCULAR | Status: AC
  Administered 2014-10-13: 11:00:00
  Filled 2014-10-13: qty 30

## 2014-10-13 MED ORDER — IOHEXOL 180 MG/ML  SOLN
INTRAMUSCULAR | Status: AC
  Administered 2014-10-13: 11:00:00
  Filled 2014-10-13: qty 20

## 2014-10-13 MED ORDER — MIDAZOLAM HCL 5 MG/5ML IJ SOLN
INTRAMUSCULAR | Status: AC
  Administered 2014-10-13: 2 mg via INTRAVENOUS
  Filled 2014-10-13: qty 5

## 2014-10-13 NOTE — Patient Instructions (Signed)
GENERAL RISKS AND COMPLICATIONS  What are the risk, side effects and possible complications? Generally speaking, most procedures are safe.  However, with any procedure there are risks, side effects, and the possibility of complications.  The risks and complications are dependent upon the sites that are lesioned, or the type of nerve block to be performed.  The closer the procedure is to the spine, the more serious the risks are.  Great care is taken when placing the radio frequency needles, block needles or lesioning probes, but sometimes complications can occur. 1. Infection: Any time there is an injection through the skin, there is a risk of infection.  This is why sterile conditions are used for these blocks.  There are four possible types of infection. 1. Localized skin infection. 2. Central Nervous System Infection-This can be in the form of Meningitis, which can be deadly. 3. Epidural Infections-This can be in the form of an epidural abscess, which can cause pressure inside of the spine, causing compression of the spinal cord with subsequent paralysis. This would require an emergency surgery to decompress, and there are no guarantees that the patient would recover from the paralysis. 4. Discitis-This is an infection of the intervertebral discs.  It occurs in about 1% of discography procedures.  It is difficult to treat and it may lead to surgery.        2. Pain: the needles have to go through skin and soft tissues, will cause soreness.       3. Damage to internal structures:  The nerves to be lesioned may be near blood vessels or    other nerves which can be potentially damaged.       4. Bleeding: Bleeding is more common if the patient is taking blood thinners such as  aspirin, Coumadin, Ticiid, Plavix, etc., or if he/she have some genetic predisposition  such as hemophilia. Bleeding into the spinal canal can cause compression of the spinal  cord with subsequent paralysis.  This would require an  emergency surgery to  decompress and there are no guarantees that the patient would recover from the  paralysis.       5. Pneumothorax:  Puncturing of a lung is a possibility, every time a needle is introduced in  the area of the chest or upper back.  Pneumothorax refers to free air around the  collapsed lung(s), inside of the thoracic cavity (chest cavity).  Another two possible  complications related to a similar event would include: Hemothorax and Chylothorax.   These are variations of the Pneumothorax, where instead of air around the collapsed  lung(s), you may have blood or chyle, respectively.       6. Spinal headaches: They may occur with any procedures in the area of the spine.       7. Persistent CSF (Cerebro-Spinal Fluid) leakage: This is a rare problem, but may occur  with prolonged intrathecal or epidural catheters either due to the formation of a fistulous  track or a dural tear.       8. Nerve damage: By working so close to the spinal cord, there is always a possibility of  nerve damage, which could be as serious as a permanent spinal cord injury with  paralysis.       9. Death:  Although rare, severe deadly allergic reactions known as "Anaphylactic  reaction" can occur to any of the medications used.      10. Worsening of the symptoms:  We can always make thing worse.    What are the chances of something like this happening? Chances of any of this occuring are extremely low.  By statistics, you have more of a chance of getting killed in a motor vehicle accident: while driving to the hospital than any of the above occurring .  Nevertheless, you should be aware that they are possibilities.  In general, it is similar to taking a shower.  Everybody knows that you can slip, hit your head and get killed.  Does that mean that you should not shower again?  Nevertheless always keep in mind that statistics do not mean anything if you happen to be on the wrong side of them.  Even if a procedure has a 1  (one) in a 1,000,000 (million) chance of going wrong, it you happen to be that one..Also, keep in mind that by statistics, you have more of a chance of having something go wrong when taking medications.  Who should not have this procedure? If you are on a blood thinning medication (e.g. Coumadin, Plavix, see list of "Blood Thinners"), or if you have an active infection going on, you should not have the procedure.  If you are taking any blood thinners, please inform your physician.  How should I prepare for this procedure?  Do not eat or drink anything at least six hours prior to the procedure.  Bring a driver with you .  It cannot be a taxi.  Come accompanied by an adult that can drive you back, and that is strong enough to help you if your legs get weak or numb from the local anesthetic.  Take all of your medicines the morning of the procedure with just enough water to swallow them.  If you have diabetes, make sure that you are scheduled to have your procedure done first thing in the morning, whenever possible.  If you have diabetes, take only half of your insulin dose and notify our nurse that you have done so as soon as you arrive at the clinic.  If you are diabetic, but only take blood sugar pills (oral hypoglycemic), then do not take them on the morning of your procedure.  You may take them after you have had the procedure.  Do not take aspirin or any aspirin-containing medications, at least eleven (11) days prior to the procedure.  They may prolong bleeding.  Wear loose fitting clothing that may be easy to take off and that you would not mind if it got stained with Betadine or blood.  Do not wear any jewelry or perfume  Remove any nail coloring.  It will interfere with some of our monitoring equipment.  NOTE: Remember that this is not meant to be interpreted as a complete list of all possible complications.  Unforeseen problems may occur.  BLOOD THINNERS The following drugs  contain aspirin or other products, which can cause increased bleeding during surgery and should not be taken for 2 weeks prior to and 1 week after surgery.  If you should need take something for relief of minor pain, you may take acetaminophen which is found in Tylenol,m Datril, Anacin-3 and Panadol. It is not blood thinner. The products listed below are.  Do not take any of the products listed below in addition to any listed on your instruction sheet.  A.P.C or A.P.C with Codeine Codeine Phosphate Capsules #3 Ibuprofen Ridaura  ABC compound Congesprin Imuran rimadil  Advil Cope Indocin Robaxisal  Alka-Seltzer Effervescent Pain Reliever and Antacid Coricidin or Coricidin-D  Indomethacin Rufen    Alka-Seltzer plus Cold Medicine Cosprin Ketoprofen S-A-C Tablets  Anacin Analgesic Tablets or Capsules Coumadin Korlgesic Salflex  Anacin Extra Strength Analgesic tablets or capsules CP-2 Tablets Lanoril Salicylate  Anaprox Cuprimine Capsules Levenox Salocol  Anexsia-D Dalteparin Magan Salsalate  Anodynos Darvon compound Magnesium Salicylate Sine-off  Ansaid Dasin Capsules Magsal Sodium Salicylate  Anturane Depen Capsules Marnal Soma  APF Arthritis pain formula Dewitt's Pills Measurin Stanback  Argesic Dia-Gesic Meclofenamic Sulfinpyrazone  Arthritis Bayer Timed Release Aspirin Diclofenac Meclomen Sulindac  Arthritis pain formula Anacin Dicumarol Medipren Supac  Analgesic (Safety coated) Arthralgen Diffunasal Mefanamic Suprofen  Arthritis Strength Bufferin Dihydrocodeine Mepro Compound Suprol  Arthropan liquid Dopirydamole Methcarbomol with Aspirin Synalgos  ASA tablets/Enseals Disalcid Micrainin Tagament  Ascriptin Doan's Midol Talwin  Ascriptin A/D Dolene Mobidin Tanderil  Ascriptin Extra Strength Dolobid Moblgesic Ticlid  Ascriptin with Codeine Doloprin or Doloprin with Codeine Momentum Tolectin  Asperbuf Duoprin Mono-gesic Trendar  Aspergum Duradyne Motrin or Motrin IB Triminicin  Aspirin  plain, buffered or enteric coated Durasal Myochrisine Trigesic  Aspirin Suppositories Easprin Nalfon Trillsate  Aspirin with Codeine Ecotrin Regular or Extra Strength Naprosyn Uracel  Atromid-S Efficin Naproxen Ursinus  Auranofin Capsules Elmiron Neocylate Vanquish  Axotal Emagrin Norgesic Verin  Azathioprine Empirin or Empirin with Codeine Normiflo Vitamin E  Azolid Emprazil Nuprin Voltaren  Bayer Aspirin plain, buffered or children's or timed BC Tablets or powders Encaprin Orgaran Warfarin Sodium  Buff-a-Comp Enoxaparin Orudis Zorpin  Buff-a-Comp with Codeine Equegesic Os-Cal-Gesic   Buffaprin Excedrin plain, buffered or Extra Strength Oxalid   Bufferin Arthritis Strength Feldene Oxphenbutazone   Bufferin plain or Extra Strength Feldene Capsules Oxycodone with Aspirin   Bufferin with Codeine Fenoprofen Fenoprofen Pabalate or Pabalate-SF   Buffets II Flogesic Panagesic   Buffinol plain or Extra Strength Florinal or Florinal with Codeine Panwarfarin   Buf-Tabs Flurbiprofen Penicillamine   Butalbital Compound Four-way cold tablets Penicillin   Butazolidin Fragmin Pepto-Bismol   Carbenicillin Geminisyn Percodan   Carna Arthritis Reliever Geopen Persantine   Carprofen Gold's salt Persistin   Chloramphenicol Goody's Phenylbutazone   Chloromycetin Haltrain Piroxlcam   Clmetidine heparin Plaquenil   Cllnoril Hyco-pap Ponstel   Clofibrate Hydroxy chloroquine Propoxyphen         Before stopping any of these medications, be sure to consult the physician who ordered them.  Some, such as Coumadin (Warfarin) are ordered to prevent or treat serious conditions such as "deep thrombosis", "pumonary embolisms", and other heart problems.  The amount of time that you may need off of the medication may also vary with the medication and the reason for which you were taking it.  If you are taking any of these medications, please make sure you notify your pain physician before you undergo any  procedures.         Lumbar Sympathetic Block Patient Information  Description: The lumbar plexus is a group of nerves that are part of the sympathetic nervous system.  These nerves supply organs in the pelvis and legs.  Lumbar sympathetic blocks are utilized for the diagnosis and treatment of painful conditions in these areas.   The lumbar plexus is located on both sides of the aorta at approximately the level of the second lumbar vertebral body.  The block will be performed with you lying on your abdomen with a pillow underneath.  Using direct x-ray guidance,   The plexus will be located on both sides of the spine.  Numbing medicine will be used to deaden the skin prior to needle insertion.  In most   cases, a small amount of sedation can be give by IV prior to the numbing medicine.  One or two small needles will be placed near the plexus and local anesthetic will be injected.  This may make your leg(s) feel warm.  The Entire block usually lasts about 15-25 minutes.  Conditions which may be treated by lumbar sympathetic block:   Reflex sympathetic dystrophy  Phantom limb pain  Peripheral neuropathy  Peripheral vascular disease ( inadequate blood flow )  Cancer pain of pelvis, leg and kidney  Preparation for the injection:  1. Do note eat any solid food or diary products within 6 hours of your appointment. 2. You may drink clear liquids up to 2 hours before appointment.  Clear liquids include water, black coffee, juice or soda.  No milk or cream please. 3. You may take your regular medication, including pain medications, with a sip of water before you appointment.  Diabetics should hold regular insulin ( if taken separately ) and take 1/2 NPH dose the morning of the procedure .  Carry some sugar containing items with you to your appointment. 4. A driver must accompany you and be prepared to drive you home after your procedure. 5. Bring all your current medication with you. 6. An IV  may be inserted and sedation may be given at the discretion of the physician.  7. A blood pressure cuff, EKG and other monitors will often be applied during the procedure.  Some patients may need to have extra oxygen administered for a short period. 8. You will be asked to provide medical information, including your allergies and medications, prior to the procedure.  We must know immediately if your taking blood thinners (like Coumadin/Warfarin) or if you are allergic to IV iodine contrast (dye).  We must know if you could possibly be pregnant.  Possible side-effects   Bleeding from needle site or deeper  Infection (rare, can require surgery)  Nerve injury (rare)  Numbness & tingling (temporary)  Collapsed lung (rare)  Spinal headache (a headache worse with upright posture)  Light-headedness (temporary)  Pain at injection site (several days)  Decreased blood pressure (temporary)  Weakness in legs (temporary)  Seizure or other drug reaction (rare)  Call if you experience:   Fever/chills associated with headache or increased back/ neck pain  Headache worsened by an upright position  New onset weakness or numbness of an extremity below the injection site  Hives or difficulty breathing ( go to the emergency room)  Inflammation or drainage at the injections site(s)  New symptoms which are concerning to you  Please note:  If effective, we will often do a series of 2-3 injections spaced 3-6 weeks apart to maximally decrease your pain.  If initial series is effective, you may be a candidate for a more permanent block of the lumbar sympathetic plexus.  If you have any questions please call [phone] [facility]  

## 2014-10-13 NOTE — Procedures (Signed)
Date of procedure: 10/13/2014  Preoperative Diagnosis:  1 chronic left leg and left foot and left hip pain 2 complex regional pain syndrome type I  Postoperative Diagnosis:  Same.  Procedure: 1. left lumbar sympathetic block at L2 and L4.  2. Fluoroscopic guidance.  Surgeon: Tod Persia, MD  Anesthesia: MAC anesthesia by the nurse and staff under my direction Informed consent was obtained and the patient appeared to accept and understand the benefits and risks of this procedure.   Pre procedure comments:  None  Description of the Procedure:  The patient was taken to the operating room and placed in the prone position.  Intravenous sedation and MAC anesthesia was administered by the nurse and staff under my direction.  After adequate draping, the lumbar spine was viewed fluoroscopically.  The oblique view was activated to the point just when the transverse processes of L2 and L4 were visualized.  The targe area for local infiltration and needle placement was the point at which the inferior border of the transverse process crossed the lateral border of the body of L2 and L4 on the left side.  Those two target areas were infiltrated with 3 cc of 1% Lidocaine used in a 25 gauge needle.  Then two #22 gauge 3 inch needles were inserted using oblique view and tunnel vision, these were directed just towards that point of intersection between  The inferior border of the transverse process and the body of L2 and L4.  Lateral fluoroscopic views were used to assess the depths of needle insertion so that the needle was at the point of the anterolateral aspect of the body of L2 and L4 on the left side.  Aspirations ere negative for blood, CSF and other body fluids.  1 cc of Omnipaque 300 was injected into the needles at L2 an L4.  Adequate distribution of the dye was observed without any intravascular washout configurations.    Then 10cc of 0.25% Bupivacaine and 6 mg of Celestone were injected  at L2 while 10 cc of 0.25% Bupivacaine and 6 mg of Celestone were injected at the L4 level.  Post injection fluoroscopy showed adequate washout of the contrast media.  Both needles were removed and adequate hemostasis was established .  The patient tolerated the procedure quite well and vital signs are stable.  There were no adverse effects.  Additional comments: This procedure was done on the fluoroscopic guidance The fluoroscopy time was 0.3 minutes MG Y was 4.6  The patient was taken to the recovery room in satisfactory condition where the patient was observed and subsequently discharged.   Will follow up in the office in 1 week  Tod Persia M.D.

## 2014-10-13 NOTE — Progress Notes (Signed)
Safety precautions to be maintained throughout the outpatient stay will include: orient to surroundings, keep bed in low position, maintain call bell within reach at all times, provide assistance with transfer out of bed and ambulation.  

## 2014-10-13 NOTE — Procedures (Signed)
No note

## 2014-10-16 ENCOUNTER — Telehealth: Payer: Self-pay | Admitting: *Deleted

## 2014-10-16 NOTE — Telephone Encounter (Signed)
Patient states she is doing better and verbalizes no complications.  States she needs to schedule appointment to get her meds.  Phone call transferred to front desk.

## 2014-10-18 ENCOUNTER — Encounter: Payer: Commercial Managed Care - HMO | Admitting: Anesthesiology

## 2014-10-25 ENCOUNTER — Ambulatory Visit: Payer: Commercial Managed Care - HMO | Attending: Anesthesiology | Admitting: Anesthesiology

## 2014-10-25 ENCOUNTER — Encounter: Payer: Self-pay | Admitting: Anesthesiology

## 2014-10-25 VITALS — BP 121/73 | HR 102 | Temp 98.4°F | Resp 18 | Ht 60.0 in | Wt 170.0 lb

## 2014-10-25 DIAGNOSIS — M25552 Pain in left hip: Principal | ICD-10-CM

## 2014-10-25 DIAGNOSIS — M1612 Unilateral primary osteoarthritis, left hip: Secondary | ICD-10-CM

## 2014-10-25 DIAGNOSIS — G8929 Other chronic pain: Secondary | ICD-10-CM

## 2014-10-25 DIAGNOSIS — Q675 Congenital deformity of spine: Secondary | ICD-10-CM

## 2014-10-25 MED ORDER — TRAMADOL HCL 50 MG PO TABS: 50.0000 mg | ORAL_TABLET | Freq: Four times a day (QID) | ORAL | Status: DC | PRN

## 2014-10-25 NOTE — Progress Notes (Signed)
   Subjective:    Patient ID: Kirsten Richardson, female    DOB: 10-03-1975, 39 y.o.   MRN: [id number] ThIs patient returned to the clinic today indicating that she is doing very much better She indicated that they lumbar sympathetic block has helped her ankle pain a great deal and that her subjective pain intensity rating is currently at 30%  She indicates now that she has bilateral hip pain and that the left side is worse than the right side  She says she feels better and is in good spirits  HPI    Review of Systems  Constitutional: Negative.   HENT: Negative.   Eyes: Negative.   Respiratory: Negative.   Cardiovascular: Negative.   Gastrointestinal: Negative.   Musculoskeletal: Negative.   Skin: Negative.   Neurological: Negative.   Hematological: Negative.   Psychiatric/Behavioral: Negative.        Objective:   Physical Exam  Constitutional: She is oriented to person, place, and time. She appears well-developed and well-nourished. No distress.  HENT:  Head: Normocephalic and atraumatic.  Right Ear: External ear normal.  Left Ear: External ear normal.  Nose: Nose normal.  Mouth/Throat: Oropharynx is clear and moist. No oropharyngeal exudate.  Eyes: Conjunctivae and EOM are normal. Pupils are equal, round, and reactive to light. Right eye exhibits no discharge. Left eye exhibits no discharge. No scleral icterus.  Neck: Normal range of motion. Neck supple. No JVD present. No tracheal deviation present. No thyromegaly present.  Cardiovascular: Normal rate, regular rhythm, normal heart sounds and intact distal pulses.  Exam reveals no gallop and no friction rub.   No murmur heard. Pulmonary/Chest: Effort normal and breath sounds normal. No respiratory distress. She has no wheezes. She has no rales. She exhibits no tenderness.  Abdominal: Soft. Bowel sounds are normal. She exhibits no distension and no mass. There is no tenderness. There is no rebound and no guarding.  Genitourinary:   Genitourinary exam was deferred  Musculoskeletal: Normal range of motion. She exhibits no edema or tenderness.  Range of motion was slightly decreased in both hips There was tenderness over both sacroiliac joints with the left side being more tender Straight leg raising test on the left side was 50 Straight leg raising test on the right side was 80 Were no new neurological or musculoskeletal findings  Lymphadenopathy:    She has no cervical adenopathy.  Neurological: She is alert and oriented to person, place, and time. She has normal reflexes. She displays normal reflexes. No cranial nerve deficit. She exhibits normal muscle tone. Coordination normal.  Skin: Skin is warm and dry. No rash noted. She is not diaphoretic. No erythema. No pallor.  Psychiatric: She has a normal mood and affect. Her behavior is normal. Thought content normal.  Nursing note and vitals reviewed.         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

## 2014-10-25 NOTE — Patient Instructions (Signed)
Sacroiliac (SI) Joint Injection Patient Information  Description: The sacroiliac joint connects the scrum (very low back and tailbone) to the ilium (a pelvic bone which also forms half of the hip joint).  Normally this joint experiences very little motion.  When this joint becomes inflamed or unstable low back and or hip and pelvis pain may result.  Injection of this joint with local anesthetics (numbing medicines) and steroids can provide diagnostic information and reduce pain.  This injection is performed with the aid of x-ray guidance into the tailbone area while you are lying on your stomach.   You may experience an electrical sensation down the leg while this is being done.  You may also experience numbness.  We also may ask if we are reproducing your normal pain during the injection.  Conditions which may be treated SI injection:   Low back, buttock, hip or leg pain  Preparation for the Injection:  1. Do not eat any solid food or dairy products within 6 hours of your appointment.  2. You may drink clear liquids up to 2 hours before appointment.  Clear liquids include water, black coffee, juice or soda.  No milk or cream please. 3. You may take your regular medications, including pain medications with a sip of water before your appointment.  Diabetics should hold regular insulin (if take separately) and take 1/2 normal NPH dose the morning of the procedure.  Carry some sugar containing items with you to your appointment. 4. A driver must accompany you and be prepared to drive you home after your procedure. 5. Bring all of your current medications with you. 6. An IV may be inserted and sedation may be given at the discretion of the physician. 7. A blood pressure cuff, EKG and other monitors will often be applied during the procedure.  Some patients may need to have extra oxygen administered for a short period.  8. You will be asked to provide medical information, including your allergies,  prior to the procedure.  We must know immediately if you are taking blood thinners (like Coumadin/Warfarin) or if you are allergic to IV iodine contrast (dye).  We must know if you could possible be pregnant.  Possible side effects:   Bleeding from needle site  Infection (rare, may require surgery)  Nerve injury (rare)  Numbness & tingling (temporary)  A brief convulsion or seizure  Light-headedness (temporary)  Pain at injection site (several days)  Decreased blood pressure (temporary)  Weakness in the leg (temporary)   Call if you experience:   New onset weakness or numbness of an extremity below the injection site that last more than 8 hours.  Hives or difficulty breathing ( go to the emergency room)  Inflammation or drainage at the injection site  Any new symptoms which are concerning to you  Please note:  Although the local anesthetic injected can often make your back/ hip/ buttock/ leg feel good for several hours after the injections, the pain will likely return.  It takes 3-7 days for steroids to work in the sacroiliac area.  You may not notice any pain relief for at least that one week.  If effective, we will often do a series of three injections spaced 3-6 weeks apart to maximally decrease your pain.  After the initial series, we generally will wait some months before a repeat injection of the same type.  If you have any questions, please call [phone] [facility]   

## 2014-10-25 NOTE — Progress Notes (Signed)
Safety precautions to be maintained throughout the outpatient stay will include: orient to surroundings, keep bed in low position, maintain call bell within reach at all times, provide assistance with transfer out of bed and ambulation.  

## 2014-10-31 ENCOUNTER — Telehealth: Payer: Self-pay | Admitting: Anesthesiology

## 2014-10-31 NOTE — Telephone Encounter (Signed)
Tramadol alone not helping / needs something else / was taking Oxycodone and that helped / dr Starling Manns took her off that / she found some Oxycodone in lock box and has been taking them / please call

## 2014-10-31 NOTE — Telephone Encounter (Signed)
Patient called and instructed that she needed evaluation appointment with Dr. Starling Manns to discuss medication management. No phone refills or med changes explained.

## 2014-11-08 ENCOUNTER — Encounter: Payer: Self-pay | Admitting: Anesthesiology

## 2014-11-08 ENCOUNTER — Ambulatory Visit: Payer: Commercial Managed Care - HMO | Attending: Anesthesiology | Admitting: Anesthesiology

## 2014-11-08 VITALS — BP 125/68 | HR 79 | Temp 98.8°F | Resp 18 | Ht 60.0 in | Wt 170.0 lb

## 2014-11-08 DIAGNOSIS — M5136 Other intervertebral disc degeneration, lumbar region: Secondary | ICD-10-CM | POA: Insufficient documentation

## 2014-11-08 DIAGNOSIS — Q675 Congenital deformity of spine: Secondary | ICD-10-CM | POA: Diagnosis not present

## 2014-11-08 DIAGNOSIS — M545 Low back pain, unspecified: Secondary | ICD-10-CM

## 2014-11-08 DIAGNOSIS — F603 Borderline personality disorder: Secondary | ICD-10-CM | POA: Insufficient documentation

## 2014-11-08 DIAGNOSIS — G8929 Other chronic pain: Secondary | ICD-10-CM | POA: Diagnosis not present

## 2014-11-08 DIAGNOSIS — M25552 Pain in left hip: Secondary | ICD-10-CM

## 2014-11-08 DIAGNOSIS — M79672 Pain in left foot: Secondary | ICD-10-CM | POA: Diagnosis present

## 2014-11-08 DIAGNOSIS — G9050 Complex regional pain syndrome I, unspecified: Secondary | ICD-10-CM | POA: Insufficient documentation

## 2014-11-08 MED ORDER — OXYCODONE HCL 5 MG PO CAPS: 5.0000 mg | ORAL_CAPSULE | Freq: Three times a day (TID) | ORAL | Status: DC | PRN

## 2014-11-08 NOTE — Progress Notes (Signed)
Safety precautions to be maintained throughout the outpatient stay will include: orient to surroundings, keep bed in low position, maintain call bell within reach at all times, provide assistance with transfer out of bed and ambulation.  

## 2014-11-08 NOTE — Progress Notes (Signed)
   Subjective:    Patient ID: Kirsten Richardson, female    DOB: [birth year], 39 y.o.   MRN: [id number] Patient return to clinic today indicating that she got extremely great relief from her left ankle and left foot pain following the left lumbar sympathetic block She indicates that that aspect of her pain is well under control She has congenital scoliosis and has always had chronic back pain Now that the left ankle and left foot pain has been resolved her chronic low back pain is given her significant discomfort Subjective pain intensity rating today is 65% The pain is located in the L4-L5 area bilaterally and tends to favor the left side However at the present time he doesn't radiate Kirsten Richardson far down into the legs She told me that in the past she has been given Vicodin and oxycodone cold However I gave her come in all and she said it doesn't work and she requested some of the stronger opioid I spent some time explaining to her that given her psychological and other mental issues that it is not appropriate to use stronger opioids over a long period However because of her fluoroscopy and give her a 10 day supply of oxycodone and we'll plan to perform a caudal epidural steroid injection for her to deal with her chronic low back pain HPI    Review of Systems  Constitutional: Negative.   HENT: Negative.   Eyes: Negative.   Respiratory: Negative.   Cardiovascular: Negative.   Gastrointestinal: Negative.   Endocrine: Negative.   Genitourinary: Negative.   Musculoskeletal: Positive for back pain, arthralgias and gait problem. Negative for myalgias, joint swelling, neck pain and neck stiffness.       This patient has congenital scoliosis and associated lumbar degenerative disc disease as a result of the scoliosis She has some gait problems but she is able to continue with her activities of daily living  Skin: Negative.   Allergic/Immunologic: Negative.   Neurological: Positive for dizziness.   Psychiatric/Behavioral: Negative.        Objective:   Physical Exam  Cardiovascular:  This patient looks quite well and did not appear to be in any distress Her blood pressure was 125/68 mmHg  Pulse is 79 bpm Equal and regular Heart sounds 1 and 2 were heard in all areas and there were no audible murmurs Temperature was 98.49F Respirations were 18 breaths per minute SPO2 was 100% Chest is clinically clear and there are no adventitious sounds Abdomen is soft and nontender There is no palpable organomegaly There Is no significant lymphadenopathy Pupils are equal and reactive Cranial nerves are intact There are no new neurological or musculoskeletal findings           Assessment & Plan:    Assessment 1 chronic low back pain 2 congenital idiopathic scoliosis 3 lumbar degenerative disc disease 4 CRPS type I 5 borderline personality disorder   Plan of management 1 Will plan a caudal epidural steroid injection for her next week 2 will give her 10 day supply of oxycodone 5 mg every 8 hours when necessary give her 30 tablets 3 Will ask her to continue to maintain a positive attitude and to try to refrain from the temptation to use opioids   Established patient     level III   Tod Persia M.D.

## 2014-11-08 NOTE — Patient Instructions (Signed)
Epidural Steroid Injection Patient Information  Description: The epidural space surrounds the nerves as they exit the spinal cord.  In some patients, the nerves can be compressed and inflamed by a bulging disc or a tight spinal canal (spinal stenosis).  By injecting steroids into the epidural space, we can bring irritated nerves into direct contact with a potentially helpful medication.  These steroids act directly on the irritated nerves and can reduce swelling and inflammation which often leads to decreased pain.  Epidural steroids may be injected anywhere along the spine and from the neck to the low back depending upon the location of your pain.   After numbing the skin with local anesthetic (like Novocaine), a small needle is passed into the epidural space slowly.  You may experience a sensation of pressure while this is being done.  The entire block usually last less than 10 minutes.  Conditions which may be treated by epidural steroids:   Low back and leg pain  Neck and arm pain  Spinal stenosis  Post-laminectomy syndrome  Herpes zoster (shingles) pain  Pain from compression fractures  Preparation for the injection:  1. Do not eat any solid food or dairy products within 6 hours of your appointment.  2. You may drink clear liquids up to 2 hours before appointment.  Clear liquids include water, black coffee, juice or soda.  No milk or cream please. 3. You may take your regular medication, including pain medications, with a sip of water before your appointment  Diabetics should hold regular insulin (if taken separately) and take 1/2 normal NPH dos the morning of the procedure.  Carry some sugar containing items with you to your appointment. 4. A driver must accompany you and be prepared to drive you home after your procedure.  5. Bring all your current medications with your. 6. An IV may be inserted and sedation may be given at the discretion of the physician.   7. A blood pressure  cuff, EKG and other monitors will often be applied during the procedure.  Some patients may need to have extra oxygen administered for a short period. 8. You will be asked to provide medical information, including your allergies, prior to the procedure.  We must know immediately if you are taking blood thinners (like Coumadin/Warfarin)  Or if you are allergic to IV iodine contrast (dye). We must know if you could possible be pregnant.  Possible side-effects:  Bleeding from needle site  Infection (rare, may require surgery)  Nerve injury (rare)  Numbness & tingling (temporary)  Difficulty urinating (rare, temporary)  Spinal headache ( a headache worse with upright posture)  Light -headedness (temporary)  Pain at injection site (several days)  Decreased blood pressure (temporary)  Weakness in arm/leg (temporary)  Pressure sensation in back/neck (temporary)  Call if you experience:  Fever/chills associated with headache or increased back/neck pain.  Headache worsened by an upright position.  New onset weakness or numbness of an extremity below the injection site  Hives or difficulty breathing (go to the emergency room)  Inflammation or drainage at the infection site  Severe back/neck pain  Any new symptoms which are concerning to you  Please note:  Although the local anesthetic injected can often make your back or neck feel good for several hours after the injection, the pain will likely return.  It takes 3-7 days for steroids to work in the epidural space.  You may not notice any pain relief for at least that one week.    If effective, we will often do a series of three injections spaced 3-6 weeks apart to maximally decrease your pain.  After the initial series, we generally will wait several months before considering a repeat injection of the same type.  If you have any questions, please call [phone] [facility] 

## 2014-11-15 ENCOUNTER — Other Ambulatory Visit: Payer: Self-pay | Admitting: *Deleted

## 2014-11-28 ENCOUNTER — Ambulatory Visit: Payer: Commercial Managed Care - HMO | Attending: Anesthesiology | Admitting: Anesthesiology

## 2014-11-28 ENCOUNTER — Encounter: Payer: Self-pay | Admitting: Anesthesiology

## 2014-11-28 VITALS — BP 149/71 | HR 71 | Temp 99.0°F | Resp 18 | Ht 60.0 in | Wt 170.0 lb

## 2014-11-28 DIAGNOSIS — M4120 Other idiopathic scoliosis, site unspecified: Secondary | ICD-10-CM | POA: Diagnosis not present

## 2014-11-28 DIAGNOSIS — M1612 Unilateral primary osteoarthritis, left hip: Secondary | ICD-10-CM | POA: Diagnosis not present

## 2014-11-28 DIAGNOSIS — Q675 Congenital deformity of spine: Secondary | ICD-10-CM | POA: Diagnosis not present

## 2014-11-28 DIAGNOSIS — M545 Low back pain: Secondary | ICD-10-CM | POA: Insufficient documentation

## 2014-11-28 DIAGNOSIS — M25552 Pain in left hip: Secondary | ICD-10-CM | POA: Diagnosis not present

## 2014-11-28 DIAGNOSIS — M8929 Other disorders of bone development and growth, multiple sites: Secondary | ICD-10-CM | POA: Diagnosis not present

## 2014-11-28 DIAGNOSIS — M5136 Other intervertebral disc degeneration, lumbar region: Secondary | ICD-10-CM | POA: Diagnosis not present

## 2014-11-28 DIAGNOSIS — G8929 Other chronic pain: Secondary | ICD-10-CM

## 2014-11-28 MED ORDER — IOHEXOL 240 MG/ML SOLN
INTRAMUSCULAR | Status: AC
  Administered 2014-11-28: 09:00:00
  Filled 2014-11-28: qty 100

## 2014-11-28 MED ORDER — TRIAMCINOLONE ACETONIDE 40 MG/ML IJ SUSP
INTRAMUSCULAR | Status: AC
  Administered 2014-11-28: 09:00:00
  Filled 2014-11-28: qty 2

## 2014-11-28 MED ORDER — TRAMADOL HCL 50 MG PO TABS: 50.0000 mg | ORAL_TABLET | Freq: Three times a day (TID) | ORAL | Status: DC

## 2014-11-28 MED ORDER — FENTANYL CITRATE (PF) 100 MCG/2ML IJ SOLN
INTRAMUSCULAR | Status: AC
  Filled 2014-11-28: qty 2

## 2014-11-28 MED ORDER — BUPIVACAINE HCL (PF) 0.25 % IJ SOLN
INTRAMUSCULAR | Status: AC
  Administered 2014-11-28: 09:00:00
  Filled 2014-11-28: qty 30

## 2014-11-28 MED ORDER — MIDAZOLAM HCL 5 MG/5ML IJ SOLN
INTRAMUSCULAR | Status: AC
  Administered 2014-11-28: 2 mg via INTRAVENOUS
  Filled 2014-11-28: qty 5

## 2014-11-28 MED ORDER — LIDOCAINE HCL (PF) 1 % IJ SOLN
INTRAMUSCULAR | Status: AC
  Administered 2014-11-28: 09:00:00
  Filled 2014-11-28: qty 5

## 2014-11-28 NOTE — Progress Notes (Signed)
   Subjective:    Patient ID: Kirsten Richardson, female    DOB: [birth year], 39 y.o.   MRN: [id number]  HPI    Review of Systems     Objective:   Physical Exam        Assessment & Plan:   This patient requested a refill on her tramadol and she was given tramadol 50 mg every 8H after meals and she was given 63 tablets  Tod Persia M.D.

## 2014-11-28 NOTE — Progress Notes (Signed)
Safety precautions to be maintained throughout the outpatient stay will include: orient to surroundings, keep bed in low position, maintain call bell within reach at all times, provide assistance with transfer out of bed and ambulation.  

## 2014-11-28 NOTE — Patient Instructions (Signed)
Epidural Steroid Injection An epidural steroid injection is given to relieve pain in your neck, back, or legs that is caused by the irritation or swelling of a nerve root. This procedure involves injecting a steroid and numbing medicine (anesthetic) into the epidural space. The epidural space is the space between the outer covering of your spinal cord and the bones that form your backbone (vertebra).  LET YOUR HEALTH CARE PROVIDER KNOW ABOUT:   Any allergies you have.  All medicines you are taking, including vitamins, herbs, eye drops, creams, and over-the-counter medicines such as aspirin.  Previous problems you or members of your family have had with the use of anesthetics.  Any blood disorders or blood clotting disorders you have.  Previous surgeries you have had.  Medical conditions you have. RISKS AND COMPLICATIONS Generally, this is a safe procedure. However, as with any procedure, complications can occur. Possible complications of epidural steroid injection include:  Headache.  Bleeding.  Infection.  Allergic reaction to the medicines.  Damage to your nerves. The response to this procedure depends on the underlying cause of the pain and its duration. People who have long-term (chronic) pain are less likely to benefit from epidural steroids than are those people whose pain comes on strong and suddenly. BEFORE THE PROCEDURE   Ask your health care provider about changing or stopping your regular medicines. You may be advised to stop taking blood-thinning medicines a few days before the procedure.  You may be given medicines to reduce anxiety.  Arrange for someone to take you home after the procedure. PROCEDURE   You will remain awake during the procedure. You may receive medicine to make you relaxed.  You will be asked to lie on your stomach.  The injection site will be cleaned.  The injection site will be numbed with a medicine (local anesthetic).  A needle will be  injected through your skin into the epidural space.  Your health care provider will use an X-ray machine to ensure that the steroid is delivered closest to the affected nerve. You may have minimal discomfort at this time.  Once the needle is in the right position, the local anesthetic and the steroid will be injected into the epidural space.  The needle will then be removed and a bandage will be applied to the injection site. AFTER THE PROCEDURE   You may be monitored for a short time before you go home.  You may feel weakness or numbness in your arm or leg, which disappears within hours.  You may be allowed to eat, drink, and take your regular medicine.  You may have soreness at the site of the injection.   This information is not intended to replace advice given to you by your health care provider. Make sure you discuss any questions you have with your health care provider.   Document Released: 04/01/2007 Document Revised: 08/25/2012 Document Reviewed: 06/11/2012 Elsevier Interactive Patient Education 2016 [employer] Pain Management Discharge Instructions  General Discharge Instructions :  If you need to reach your doctor call: Monday-Friday 8:00 am - 4:00 pm at [phone] or toll free [phone].  After clinic hours [phone] to have operator reach doctor.  Bring all of your medication bottles to all your appointments in the pain clinic.  To cancel or reschedule your appointment with Pain Management please remember to call 24 hours in advance to avoid a fee.  Refer to the educational materials which you have been given on: General Risks, I had my Procedure.   Discharge Instructions, Post Sedation.  Post Procedure Instructions:  The drugs you were given will stay in your system until tomorrow, so for the next 24 hours you should not drive, make any legal decisions or drink any alcoholic beverages.  You may eat anything you prefer, but it is better to start with  liquids then soups and crackers, and gradually work up to solid foods.  Please notify your doctor immediately if you have any unusual bleeding, trouble breathing or pain that is not related to your normal pain.  Depending on the type of procedure that was done, some parts of your body may feel week and/or numb.  This usually clears up by tonight or the next day.  Walk with the use of an assistive device or accompanied by an adult for the 24 hours.  You may use ice on the affected area for the first 24 hours.  Put ice in a Ziploc bag and cover with a towel and place against area 15 minutes on 15 minutes off.  You may switch to heat after 24 hours. 

## 2014-11-28 NOTE — Procedures (Signed)
Date of procedure: 11/28/2014  Preoperative Diagnosis:  1 chronic low back pain 2 chronic left hip pain 3 idiopathic scoliosis 4 lumbar degenerative disc disease  Postoperative Diagnosis:  Same.  Procedure: 1. Caudal epidural steroid injection, 2. Epidural with interpretation. 3. Fluoroscopic guidance.  Surgeon: Tod Persia, MD  Anesthesia: MAC anesthesia by by the nursing staff under my direction  Informed consent was obtained and the patient appeared to accept and understand the benefits and risks of this procedure.   Pre procedure comments:  None  Description of the Procedure:  The patient was taken to the operating room and placed in the prone position.   Intravenous sedation and MAC anesthesia was administered by the nursing staff under my direction. After appropriate sedation, the sacrococcygeal area was prepped with Betadine.   After adequate draping, the area between the sacral cornu was palpated and infiltrated with 3 cc of 1% Lidocaine.   An AP fluoroscopic view of the sacrum was visualized and a 17 gauge Tuohy needle was inserted in the midline at the angle of 45 degrees through the sacrococcygeal membrane.   After making contact with the bone, the needle was withdrawn and readvanced in horizontal position, into the caudal epidural space.  Epidurogram Study: One cc of Omnipaque 240 was injected through the needle and epidurogram was visualized in both the later and AP views. After injecting Omnipaque 240 into the Tuohy needle the contrast was observed to spread catheter cephalad as far as L4 The distribution was adequate on the right side but was somewhat restricted though not completely so on the left side at the L4-L5 level  Comments:   Fluoroscopy time was 0.58minutes Number of fluoroscopic frames was 2 MGY was 3.6 No catheter was used.  Caudal Epidural Steroid Injection: Then 10 cc of 0.25% Bupivacaine and 80 mg of Kenalog were injected into the  Caudal epidural space.   The needle was removed and adequate hemostasis was established.   The patient tolerated the procedure quite well and vital signs were stable.   There were no adverse effects.  Additional comments:   The patient was taken to the recovery room in satisfactory condition where the patient was observed and subsequently discharged home.   Will follow up in the clinic in the next week.  Tod Persia M.D.

## 2014-11-29 ENCOUNTER — Telehealth: Payer: Self-pay

## 2014-11-29 NOTE — Telephone Encounter (Signed)
Post procedure call back.  Patient states she is feeling better.

## 2014-12-04 ENCOUNTER — Telehealth: Payer: Self-pay | Admitting: *Deleted

## 2014-12-04 NOTE — Telephone Encounter (Signed)
Patient reports no complications from procedure on 11/28/2014

## 2014-12-05 ENCOUNTER — Ambulatory Visit: Payer: Commercial Managed Care - HMO | Admitting: Pain Medicine

## 2014-12-06 ENCOUNTER — Ambulatory Visit: Payer: Commercial Managed Care - HMO | Admitting: Anesthesiology

## 2014-12-18 ENCOUNTER — Other Ambulatory Visit: Payer: Self-pay | Admitting: Anesthesiology

## 2014-12-18 ENCOUNTER — Ambulatory Visit: Payer: Commercial Managed Care - HMO | Attending: Anesthesiology | Admitting: Anesthesiology

## 2014-12-18 ENCOUNTER — Telehealth: Payer: Self-pay

## 2014-12-18 VITALS — BP 149/88 | HR 115 | Temp 98.1°F | Resp 18 | Ht 59.0 in | Wt 175.0 lb

## 2014-12-18 DIAGNOSIS — M5116 Intervertebral disc disorders with radiculopathy, lumbar region: Secondary | ICD-10-CM | POA: Diagnosis not present

## 2014-12-18 DIAGNOSIS — G8929 Other chronic pain: Secondary | ICD-10-CM | POA: Insufficient documentation

## 2014-12-18 DIAGNOSIS — M545 Low back pain: Secondary | ICD-10-CM | POA: Diagnosis present

## 2014-12-18 DIAGNOSIS — M25571 Pain in right ankle and joints of right foot: Secondary | ICD-10-CM

## 2014-12-18 DIAGNOSIS — Z7289 Other problems related to lifestyle: Secondary | ICD-10-CM | POA: Diagnosis not present

## 2014-12-18 DIAGNOSIS — M25551 Pain in right hip: Secondary | ICD-10-CM

## 2014-12-18 DIAGNOSIS — M1612 Unilateral primary osteoarthritis, left hip: Secondary | ICD-10-CM

## 2014-12-18 MED ORDER — OXYCODONE HCL 5 MG PO CAPS: 5.0000 mg | ORAL_CAPSULE | Freq: Two times a day (BID) | ORAL | Status: DC

## 2014-12-18 MED ORDER — TRAMADOL HCL 50 MG PO TABS: 50.0000 mg | ORAL_TABLET | Freq: Three times a day (TID) | ORAL | Status: DC

## 2014-12-18 NOTE — Patient Instructions (Signed)
You were given a prescription for Tramadol today. 

## 2014-12-18 NOTE — Progress Notes (Signed)
Safety precautions to be maintained throughout the outpatient stay will include: orient to surroundings, keep bed in low position, maintain call bell within reach at all times, provide assistance with transfer out of bed and ambulation.  

## 2014-12-22 ENCOUNTER — Encounter: Payer: Self-pay | Admitting: Anesthesiology

## 2014-12-22 NOTE — Progress Notes (Signed)
   Subjective:    Patient ID: Kirsten Richardson, female    DOB: 04-27-1975, 39 y.o.   MRN: [id number] This patient return to the clinic today indicating did not obtain longer-lasting relief from the caudal epidural steroid injection She indicated that she only got about 2 or 3 days pain relief She indicated that her subjective pain was 70% She was accompanied in the clinic by her friend possibly her husbandand his demeanorwas passively aggressive He attempted to answer any questions that  I asked regarding pain medication His behavior had me very suspicious and when I asked him to allow the patient to answer his demeanor became very hostile The patient was scheduled for a caudal  Epidural steroid injection but she opted not to have it today. Her behavior was characteristic of a patient with  drug seeking behavior   HPI    Review of Systems  Constitutional: Negative.   HENT: Negative.   Eyes: Negative.   Respiratory: Negative.   Cardiovascular: Negative.   Gastrointestinal: Negative.   Endocrine: Negative.   Genitourinary: Negative.   Musculoskeletal: Negative.   Skin: Negative.   Allergic/Immunologic: Negative.   Neurological: Negative.   Hematological: Negative.   Psychiatric/Behavioral: Negative.        Objective:   Physical Exam  Cardiovascular:  Patient was well groomed and was accompanied by a gentleman who was in her partner or her husband She appeared to be in no distress Her blood pressure was 149/88 mmHg Pulse was 115 bpm Equal and regular Heart sounds 1 and 2 were heard in all areas Temperature was 98.55F Respirations were 18 breaths per minute sP O2 was 98% Chest is clinically clear No adventitious sounds Abdomen was soft and nontender No palpable organomegaly Significant lymphadenopathy Pupils were equal and reactive Cranial nerves were intact There were no new neurological or musculoskeletal findings  Nursing note and vitals reviewed.          Assessment & Plan:    Assessment 1 chronic low back pain 2  lumbar degenerative disc disease 3 lumbar radiculopathy 4 Drug-seeking behavior      Plan of management 1 this patient is probably going to be discharged from this clinic but I feel that I would like to talk with her away from this belligerent person who was with her.Therefore I will not discharge at this time but will reassess her in 1 month's time  2 we will give her tramadol 50 mg 3 times a day after meals and given 90 tablets  3 we will give her oxycodone 5 mg every 12 hours and give her 60 tablets  4 I will reevaluate her in 1 month   Established patient      Level III    Tod Persia M.D.

## 2014-12-23 LAB — TOXASSURE SELECT 13 (MW), URINE: PDF: 0

## 2014-12-25 NOTE — Telephone Encounter (Signed)
Started in error

## 2015-01-17 ENCOUNTER — Ambulatory Visit: Payer: Commercial Managed Care - HMO | Attending: Anesthesiology | Admitting: Anesthesiology

## 2015-01-17 ENCOUNTER — Encounter: Payer: Self-pay | Admitting: Anesthesiology

## 2015-01-17 VITALS — BP 146/86 | HR 110 | Temp 98.7°F | Resp 16 | Ht 60.0 in | Wt 170.0 lb

## 2015-01-17 DIAGNOSIS — M5116 Intervertebral disc disorders with radiculopathy, lumbar region: Secondary | ICD-10-CM | POA: Diagnosis not present

## 2015-01-17 DIAGNOSIS — Q675 Congenital deformity of spine: Secondary | ICD-10-CM | POA: Insufficient documentation

## 2015-01-17 DIAGNOSIS — X58XXXA Exposure to other specified factors, initial encounter: Secondary | ICD-10-CM | POA: Diagnosis not present

## 2015-01-17 DIAGNOSIS — M1612 Unilateral primary osteoarthritis, left hip: Secondary | ICD-10-CM | POA: Diagnosis not present

## 2015-01-17 DIAGNOSIS — M545 Low back pain, unspecified: Secondary | ICD-10-CM

## 2015-01-17 DIAGNOSIS — M5137 Other intervertebral disc degeneration, lumbosacral region: Secondary | ICD-10-CM

## 2015-01-17 DIAGNOSIS — M25552 Pain in left hip: Secondary | ICD-10-CM | POA: Diagnosis present

## 2015-01-17 DIAGNOSIS — G8929 Other chronic pain: Secondary | ICD-10-CM | POA: Insufficient documentation

## 2015-01-17 DIAGNOSIS — S9002XA Contusion of left ankle, initial encounter: Secondary | ICD-10-CM | POA: Insufficient documentation

## 2015-01-17 DIAGNOSIS — M5416 Radiculopathy, lumbar region: Secondary | ICD-10-CM

## 2015-01-17 MED ORDER — OXYCODONE HCL 5 MG PO CAPS: 5.0000 mg | ORAL_CAPSULE | Freq: Two times a day (BID) | ORAL | Status: DC

## 2015-01-17 NOTE — Patient Instructions (Signed)
You were given a prescription for Tramadol today. Epidural Steroid Injection Patient Information  Description: The epidural space surrounds the nerves as they exit the spinal cord.  In some patients, the nerves can be compressed and inflamed by a bulging disc or a tight spinal canal (spinal stenosis).  By injecting steroids into the epidural space, we can bring irritated nerves into direct contact with a potentially helpful medication.  These steroids act directly on the irritated nerves and can reduce swelling and inflammation which often leads to decreased pain.  Epidural steroids may be injected anywhere along the spine and from the neck to the low back depending upon the location of your pain.   After numbing the skin with local anesthetic (like Novocaine), a small needle is passed into the epidural space slowly.  You may experience a sensation of pressure while this is being done.  The entire block usually last less than 10 minutes.  Conditions which may be treated by epidural steroids:   Low back and leg pain  Neck and arm pain  Spinal stenosis  Post-laminectomy syndrome  Herpes zoster (shingles) pain  Pain from compression fractures  Preparation for the injection:  1. Do not eat any solid food or dairy products within 6 hours of your appointment.  2. You may drink clear liquids up to 2 hours before appointment.  Clear liquids include water, black coffee, juice or soda.  No milk or cream please. 3. You may take your regular medication, including pain medications, with a sip of water before your appointment  Diabetics should hold regular insulin (if taken separately) and take 1/2 normal NPH dos the morning of the procedure.  Carry some sugar containing items with you to your appointment. 4. A driver must accompany you and be prepared to drive you home after your procedure.  5. Bring all your current medications with your. 6. An IV may be inserted and sedation may be given at the  discretion of the physician.   7. A blood pressure cuff, EKG and other monitors will often be applied during the procedure.  Some patients may need to have extra oxygen administered for a short period. 8. You will be asked to provide medical information, including your allergies, prior to the procedure.  We must know immediately if you are taking blood thinners (like Coumadin/Warfarin)  Or if you are allergic to IV iodine contrast (dye). We must know if you could possible be pregnant.  Possible side-effects:  Bleeding from needle site  Infection (rare, may require surgery)  Nerve injury (rare)  Numbness & tingling (temporary)  Difficulty urinating (rare, temporary)  Spinal headache ( a headache worse with upright posture)  Light -headedness (temporary)  Pain at injection site (several days)  Decreased blood pressure (temporary)  Weakness in arm/leg (temporary)  Pressure sensation in back/neck (temporary)  Call if you experience:  Fever/chills associated with headache or increased back/neck pain.  Headache worsened by an upright position.  New onset weakness or numbness of an extremity below the injection site  Hives or difficulty breathing (go to the emergency room)  Inflammation or drainage at the infection site  Severe back/neck pain  Any new symptoms which are concerning to you  Please note:  Although the local anesthetic injected can often make your back or neck feel good for several hours after the injection, the pain will likely return.  It takes 3-7 days for steroids to work in the epidural space.  You may not notice any pain  relief for at least that one week.  If effective, we will often do a series of three injections spaced 3-6 weeks apart to maximally decrease your pain.  After the initial series, we generally will wait several months before considering a repeat injection of the same type.  If you have any questions, please call [phone] [facility]
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
## 2015-01-17 NOTE — Progress Notes (Signed)
Safety precautions to be maintained throughout the outpatient stay will include: orient to surroundings, keep bed in low position, maintain call bell within reach at all times, provide assistance with transfer out of bed and ambulation.  

## 2015-02-06 ENCOUNTER — Telehealth: Payer: Self-pay | Admitting: Anesthesiology

## 2015-02-06 NOTE — Telephone Encounter (Signed)
Calling to make sure the epidural is for her ankle pain ? Also wanted to make sure when her appt is ? 02-14-15 at 11:30 for Epidural / please call patient to discuss

## 2015-02-07 NOTE — Telephone Encounter (Signed)
[facility] Nurses Dutch Quint and Olegario Messier Please verify patient's appointment for lumbar epidural steroid injection Please call patient today and inform her of the time of her procedure The patient thinks that the appointment is February 8 at 11:30. Please check schedule and confirm exact time and date of lumbar epidural steroid injection

## 2015-02-07 NOTE — Telephone Encounter (Signed)
This patient is [name] and wants to know if the epidural is for her ankle ?

## 2015-02-08 ENCOUNTER — Other Ambulatory Visit: Payer: Self-pay | Admitting: Obstetrics and Gynecology

## 2015-02-08 DIAGNOSIS — Z1231 Encounter for screening mammogram for malignant neoplasm of breast: Secondary | ICD-10-CM

## 2015-02-08 NOTE — Telephone Encounter (Signed)
Unable to provide patient with this information, nurse was not present when this was discussed with physician. Patient informed.

## 2015-02-14 ENCOUNTER — Ambulatory Visit: Payer: Commercial Managed Care - HMO | Admitting: Anesthesiology

## 2015-02-20 ENCOUNTER — Ambulatory Visit: Payer: Commercial Managed Care - HMO | Attending: Obstetrics and Gynecology

## 2015-03-13 ENCOUNTER — Ambulatory Visit: Payer: Commercial Managed Care - HMO | Attending: Anesthesiology | Admitting: Anesthesiology

## 2015-03-13 ENCOUNTER — Encounter: Payer: Self-pay | Admitting: Anesthesiology

## 2015-03-13 VITALS — BP 143/80 | HR 85 | Temp 97.4°F | Resp 14 | Ht 59.0 in | Wt 167.0 lb

## 2015-03-13 DIAGNOSIS — Q675 Congenital deformity of spine: Secondary | ICD-10-CM | POA: Diagnosis not present

## 2015-03-13 DIAGNOSIS — G90522 Complex regional pain syndrome I of left lower limb: Secondary | ICD-10-CM | POA: Diagnosis not present

## 2015-03-13 DIAGNOSIS — M25552 Pain in left hip: Secondary | ICD-10-CM | POA: Diagnosis present

## 2015-03-13 DIAGNOSIS — G8929 Other chronic pain: Secondary | ICD-10-CM

## 2015-03-13 DIAGNOSIS — M79672 Pain in left foot: Secondary | ICD-10-CM | POA: Insufficient documentation

## 2015-03-13 DIAGNOSIS — M25572 Pain in left ankle and joints of left foot: Secondary | ICD-10-CM

## 2015-03-13 MED ORDER — FENTANYL CITRATE (PF) 100 MCG/2ML IJ SOLN
INTRAMUSCULAR | Status: AC
  Administered 2015-03-13: 100 ug via INTRAVENOUS
  Filled 2015-03-13: qty 2

## 2015-03-13 MED ORDER — MIDAZOLAM HCL 5 MG/5ML IJ SOLN
INTRAMUSCULAR | Status: AC
  Administered 2015-03-13: 2 mg via INTRAVENOUS
  Filled 2015-03-13: qty 5

## 2015-03-13 MED ORDER — IOHEXOL 180 MG/ML  SOLN
INTRAMUSCULAR | Status: AC
  Administered 2015-03-13: 10:00:00
  Filled 2015-03-13: qty 20

## 2015-03-13 MED ORDER — FENTANYL CITRATE (PF) 100 MCG/2ML IJ SOLN: 50.0000 ug | INTRAMUSCULAR | Status: DC

## 2015-03-13 MED ORDER — BUPIVACAINE HCL (PF) 0.25 % IJ SOLN
INTRAMUSCULAR | Status: AC
  Administered 2015-03-13: 10:00:00
  Filled 2015-03-13: qty 30

## 2015-03-13 MED ORDER — BUPIVACAINE HCL (PF) 0.25 % IJ SOLN
20.0000 mL | Freq: Once | INTRAMUSCULAR | Status: DC
Start: 2015-03-13 — End: 2016-09-01

## 2015-03-13 MED ORDER — MIDAZOLAM HCL 5 MG/5ML IJ SOLN: 1.0000 mg | INTRAMUSCULAR | Status: DC

## 2015-03-13 MED ORDER — TRIAMCINOLONE ACETONIDE 40 MG/ML IJ SUSP: 80.0000 mg | Freq: Once | INTRAMUSCULAR | Status: DC

## 2015-03-13 MED ORDER — OXYCODONE HCL 5 MG PO CAPS: 5.0000 mg | ORAL_CAPSULE | Freq: Two times a day (BID) | ORAL | Status: DC | PRN

## 2015-03-13 MED ORDER — IOHEXOL 180 MG/ML  SOLN: 20.0000 mL | INTRAMUSCULAR | Status: DC | PRN

## 2015-03-13 MED ORDER — TRIAMCINOLONE ACETONIDE 40 MG/ML IJ SUSP
INTRAMUSCULAR | Status: AC
  Administered 2015-03-13: 10:00:00
  Filled 2015-03-13: qty 2

## 2015-03-13 NOTE — Procedures (Signed)
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  Procedure: 1. left Lumbar transforaminal epidural steroid injection at L4-L5 and S1. 2. Epidurogram with interpretation. 3. Fluoroscopic guidance.  Surgeon: Tod Persia, MD  Anesthesia: MAC anesthesia by the nurse and staff under my direction  Informed consent was obtained and the patient appeared to accept and understand the risks of this procedure.  Pre procedure Comments:  None.  Description of Procedure: The patient was taken to the operating room and placed in the prone position. Intravenous sedation and MAC anesthesia was administered by the nurse and staff under my direction.  After appropriate sedation, the lumbosacral area was prepped with Betadine.   Then the patient was fully draped.  Fluoroscopic view of the lumbar spine was done and the pedicle on the left side was visualized.   The skin over the 6 o'clock position of the pedicle on the left was infiltrated with 3 cc of 1% Lidocaine using a 25 gauge needle.   Adequate visualization of the pedicle was obtained by using an oblique fluoroscopic view.   Then a 22 gauge 3 1/2 spinal needle was introduced through the skin and directed towards the 6 o'clock position of  The pedicles at L 4 and L5.   Then a lateral view was utilized to ensure optimal needle position and depth in the foramen.   Epidurogram with Interpretation: After negative aspiration, each needle was injected with 1 cc of Omnipaque 180 in the performance of independent epidurograms for the purposes of detailing the epidural anatomy along with nerve root anatomy at the corresponding levels.  Further, the visualization of the contrast media was also to ensure adequate spread of the injectate into the presumed affected areas.  No  intravascular or intrathecal spread was observed at any level and there was no corresponding paresthesias upon injection. Epidurograms were visualized along with spread of contrast media into the exiting nerve roots of both L4 and L5 wiith L4 being more dominant.  Transforaminal Epidural Steroid Injection: Again aspirations were negative for blood, CSF or other body fluids.  Then 1.5 cc of 0.25% Bupivacaine and 25 mg of Kenalog was injected in the L 4 and L5  foramen.  The left S1 foramen was visualized using an oblique fluoroscopic view.   The target area was the 10 o'clock/2 o'clock and that target area was used for local infiltration and for needle placement.   The target area was infiltrated with 3 cc of 1% Lidocaine using a 25 gauge needle.  Then a 3-inch 22 gauge needle was inserted into the left S1 foramen.   A lateral fluoroscopic view was used to confirm needle positioning and 1 cc of Omnipaque 180 was injected and a clear epidurogram with runoff of the S1 nerve root was visualized.   Bupivacaine 0.25% 1.5 cc and 25 mg of Kenalog were injected into the left S1.  The needle was removed and adequate hemostasis was established.  Additional Comments: This procedure was done using fluoroscopic guidance The fluoroscopic parameters were as follows: The number of fluoroscopic frames were 6 Postoperative time was 1.0 minute MG Y was 13.4  The patient tolerated the procedures quite well.  he needles were removed.  Adequate hemostasis was established and vital signs were stable.  She was given a prescription for oxycodone 5 mg 1-2  tablets per day on a when necessary basis and she was given 45 tablets The patient was taken to the recovery room in satisfactory condition where the patient was observed and subsequently discharged home.  Will follow up in the clinic in the next month.   Tod Persia M.D.

## 2015-03-13 NOTE — Patient Instructions (Signed)
A prescription for OXYCODONE was given to you today.  Pain Management Discharge Instructions  General Discharge Instructions :  If you need to reach your doctor call: Monday-Friday 8:00 am - 4:00 pm at [phone] or toll free [phone].  After clinic hours [phone] to have operator reach doctor.  Bring all of your medication bottles to all your appointments in the pain clinic.  To cancel or reschedule your appointment with Pain Management please remember to call 24 hours in advance to avoid a fee.  Refer to the educational materials which you have been given on: General Risks, I had my Procedure. Discharge Instructions, Post Sedation.  Post Procedure Instructions:  The drugs you were given will stay in your system until tomorrow, so for the next 24 hours you should not drive, make any legal decisions or drink any alcoholic beverages.  You may eat anything you prefer, but it is better to start with liquids then soups and crackers, and gradually work up to solid foods.  Please notify your doctor immediately if you have any unusual bleeding, trouble breathing or pain that is not related to your normal pain.  Depending on the type of procedure that was done, some parts of your body may feel week and/or numb.  This usually clears up by tonight or the next day.  Walk with the use of an assistive device or accompanied by an adult for the 24 hours.  You may use ice on the affected area for the first 24 hours.  Put ice in a Ziploc bag and cover with a towel and place against area 15 minutes on 15 minutes off.  You may switch to heat after 24 hours.

## 2015-03-13 NOTE — Progress Notes (Signed)
Safety precautions to be maintained throughout the outpatient stay will include: orient to surroundings, keep bed in low position, maintain call bell within reach at all times, provide assistance with transfer out of bed and ambulation.  

## 2015-03-14 ENCOUNTER — Telehealth: Payer: Self-pay

## 2015-03-14 ENCOUNTER — Telehealth: Payer: Self-pay | Admitting: *Deleted

## 2015-03-14 NOTE — Telephone Encounter (Signed)
No problems post procedure. 

## 2015-03-14 NOTE — Addendum Note (Signed)
Addended by: Concepcion Elk on: 03/14/2015 02:03 PM   Modules accepted: Orders

## 2015-03-14 NOTE — Telephone Encounter (Signed)
Ocycodine 5 mg- Dr. Starling Manns wrote script for pt in capsule form. Pt states she has been to three pharmacies and they only have it in tablet form pt is requesting Dr. Starling Manns change this ASAP

## 2015-03-14 NOTE — Telephone Encounter (Signed)
Patient instructed to return her prescription to [place], will write a new one for the tables.

## 2015-04-04 DIAGNOSIS — E034 Atrophy of thyroid (acquired): Secondary | ICD-10-CM | POA: Diagnosis present

## 2015-04-13 ENCOUNTER — Encounter: Payer: Self-pay | Admitting: Anesthesiology

## 2015-04-13 ENCOUNTER — Ambulatory Visit: Payer: Commercial Managed Care - HMO | Attending: Anesthesiology | Admitting: Anesthesiology

## 2015-04-13 ENCOUNTER — Encounter (INDEPENDENT_AMBULATORY_CARE_PROVIDER_SITE_OTHER): Payer: Self-pay

## 2015-04-13 VITALS — BP 144/86 | HR 92 | Temp 98.1°F | Resp 18 | Ht 59.0 in | Wt 170.0 lb

## 2015-04-13 DIAGNOSIS — M25552 Pain in left hip: Secondary | ICD-10-CM | POA: Insufficient documentation

## 2015-04-13 DIAGNOSIS — R52 Pain, unspecified: Secondary | ICD-10-CM | POA: Diagnosis present

## 2015-04-13 DIAGNOSIS — G8929 Other chronic pain: Secondary | ICD-10-CM | POA: Insufficient documentation

## 2015-04-13 DIAGNOSIS — R509 Fever, unspecified: Secondary | ICD-10-CM | POA: Diagnosis present

## 2015-04-13 DIAGNOSIS — G90522 Complex regional pain syndrome I of left lower limb: Secondary | ICD-10-CM

## 2015-04-13 DIAGNOSIS — M1612 Unilateral primary osteoarthritis, left hip: Secondary | ICD-10-CM

## 2015-04-13 DIAGNOSIS — Q675 Congenital deformity of spine: Secondary | ICD-10-CM

## 2015-04-13 DIAGNOSIS — G9050 Complex regional pain syndrome I, unspecified: Secondary | ICD-10-CM | POA: Diagnosis not present

## 2015-04-13 DIAGNOSIS — M25572 Pain in left ankle and joints of left foot: Secondary | ICD-10-CM

## 2015-04-13 MED ORDER — OXYCODONE HCL 5 MG PO CAPS: 5.0000 mg | ORAL_CAPSULE | Freq: Two times a day (BID) | ORAL | Status: DC | PRN

## 2015-04-13 NOTE — Patient Instructions (Signed)
You were given a prescription for Oxycodone today. Epidural Steroid Injection Patient Information  Description: The epidural space surrounds the nerves as they exit the spinal cord.  In some patients, the nerves can be compressed and inflamed by a bulging disc or a tight spinal canal (spinal stenosis).  By injecting steroids into the epidural space, we can bring irritated nerves into direct contact with a potentially helpful medication.  These steroids act directly on the irritated nerves and can reduce swelling and inflammation which often leads to decreased pain.  Epidural steroids may be injected anywhere along the spine and from the neck to the low back depending upon the location of your pain.   After numbing the skin with local anesthetic (like Novocaine), a small needle is passed into the epidural space slowly.  You may experience a sensation of pressure while this is being done.  The entire block usually last less than 10 minutes.  Conditions which may be treated by epidural steroids:   Low back and leg pain  Neck and arm pain  Spinal stenosis  Post-laminectomy syndrome  Herpes zoster (shingles) pain  Pain from compression fractures  Preparation for the injection:  1. Do not eat any solid food or dairy products within 8 hours of your appointment.  2. You may drink clear liquids up to 3 hours before appointment.  Clear liquids include water, black coffee, juice or soda.  No milk or cream please. 3. You may take your regular medication, including pain medications, with a sip of water before your appointment  Diabetics should hold regular insulin (if taken separately) and take 1/2 normal NPH dos the morning of the procedure.  Carry some sugar containing items with you to your appointment. 4. A driver must accompany you and be prepared to drive you home after your procedure.  5. Bring all your current medications with your. 6. An IV may be inserted and sedation may be given at the  discretion of the physician.   7. A blood pressure cuff, EKG and other monitors will often be applied during the procedure.  Some patients may need to have extra oxygen administered for a short period. 8. You will be asked to provide medical information, including your allergies, prior to the procedure.  We must know immediately if you are taking blood thinners (like Coumadin/Warfarin)  Or if you are allergic to IV iodine contrast (dye). We must know if you could possible be pregnant.  Possible side-effects:  Bleeding from needle site  Infection (rare, may require surgery)  Nerve injury (rare)  Numbness & tingling (temporary)  Difficulty urinating (rare, temporary)  Spinal headache ( a headache worse with upright posture)  Light -headedness (temporary)  Pain at injection site (several days)  Decreased blood pressure (temporary)  Weakness in arm/leg (temporary)  Pressure sensation in back/neck (temporary)  Call if you experience:  Fever/chills associated with headache or increased back/neck pain.  Headache worsened by an upright position.  New onset weakness or numbness of an extremity below the injection site  Hives or difficulty breathing (go to the emergency room)  Inflammation or drainage at the infection site  Severe back/neck pain  Any new symptoms which are concerning to you  Please note:  Although the local anesthetic injected can often make your back or neck feel good for several hours after the injection, the pain will likely return.  It takes 3-7 days for steroids to work in the epidural space.  You may not notice any pain   relief for at least that one week.  If effective, we will often do a series of three injections spaced 3-6 weeks apart to maximally decrease your pain.  After the initial series, we generally will wait several months before considering a repeat injection of the same type.  If you have any questions, please call [phone] [place]  [facility] 

## 2015-04-13 NOTE — Progress Notes (Signed)
Safety precautions to be maintained throughout the outpatient stay will include: orient to surroundings, keep bed in low position, maintain call bell within reach at all times, provide assistance with transfer out of bed and ambulation.  

## 2015-04-13 NOTE — Progress Notes (Signed)
   Subjective:    Patient ID: Kirsten Richardson, female    DOB: 05/10/1975, 40 y.o.   MRN: [id number]  HPI  ThIs patient returned to the clinic today indicating that her pain is quite severe She indicated that she has a bad cold with the flu and also has a fever She does not look too well and probably this is due to the flu Her subjective pain intensity rating is 80%   Review of Systems  Constitutional: Negative.   HENT: Negative.   Eyes: Negative.   Respiratory: Negative.        Patient has some bad case of influenza  Cardiovascular: Negative.   Gastrointestinal: Negative.   Endocrine: Negative.   Genitourinary: Negative.   Musculoskeletal: Negative.   Skin: Negative.   Allergic/Immunologic: Negative.   Neurological: Negative.   Hematological: Negative.   Psychiatric/Behavioral: Negative.        Objective:   Physical Exam  Cardiovascular:  This patient looks alert but is in no cardiorespiratory distress Her blood pressure is 144/86 mmHg Pulse is 92 bpm Equal and regular Heart Sounds 1 and 2 were heard in all areas Temperature was 98.76F Respirations were 18 breaths per minute SPO2 was 100% Chest is clinically clear There were no adventitious sounds Abdomen is soft and nontender There is no palpable organomegaly  There was no significant lymphadenopathy Pupils were equal and reactive Radial nerves were intact   Nursing note and vitals reviewed.         Assessment & Plan:   Assessment 1 chronic left hip pain 2 chronic left ankle and chronic left foot pain 3 CRPS type I   Plan of management 1 Will defer procedure since patient is not feeling well 2 Will continue her oxycodone 5 mg tablets 1-2 when necessary and will give her 60 tablets 3 Will follow up with her in 2 months  Established patient  Level 3   Tod Persia M.D.

## 2015-05-18 ENCOUNTER — Ambulatory Visit: Payer: Commercial Managed Care - HMO | Attending: Anesthesiology | Admitting: Anesthesiology

## 2015-05-18 ENCOUNTER — Encounter: Payer: Self-pay | Admitting: Anesthesiology

## 2015-05-18 VITALS — BP 151/114 | HR 99 | Temp 98.7°F | Resp 16 | Ht 60.0 in | Wt 170.0 lb

## 2015-05-18 DIAGNOSIS — M25552 Pain in left hip: Secondary | ICD-10-CM | POA: Diagnosis not present

## 2015-05-18 DIAGNOSIS — F603 Borderline personality disorder: Secondary | ICD-10-CM | POA: Insufficient documentation

## 2015-05-18 DIAGNOSIS — G8929 Other chronic pain: Secondary | ICD-10-CM | POA: Insufficient documentation

## 2015-05-18 DIAGNOSIS — F3189 Other bipolar disorder: Secondary | ICD-10-CM | POA: Diagnosis not present

## 2015-05-18 DIAGNOSIS — M79605 Pain in left leg: Secondary | ICD-10-CM | POA: Diagnosis not present

## 2015-05-18 DIAGNOSIS — M1612 Unilateral primary osteoarthritis, left hip: Secondary | ICD-10-CM

## 2015-05-18 DIAGNOSIS — G90522 Complex regional pain syndrome I of left lower limb: Secondary | ICD-10-CM | POA: Insufficient documentation

## 2015-05-18 DIAGNOSIS — Q675 Congenital deformity of spine: Secondary | ICD-10-CM | POA: Insufficient documentation

## 2015-05-18 DIAGNOSIS — M25572 Pain in left ankle and joints of left foot: Secondary | ICD-10-CM

## 2015-05-18 MED ORDER — OXYCODONE HCL 5 MG PO CAPS: 5.0000 mg | ORAL_CAPSULE | ORAL | Status: DC

## 2015-05-18 NOTE — Progress Notes (Signed)
Safety precautions to be maintained throughout the outpatient stay will include: orient to surroundings, keep bed in low position, maintain call bell within reach at all times, provide assistance with transfer out of bed and ambulation.  

## 2015-05-18 NOTE — Patient Instructions (Signed)
Lumbar Sympathetic Block Patient Information  Description: The lumbar plexus is a group of nerves that are part of the sympathetic nervous system.  These nerves supply organs in the pelvis and legs.  Lumbar sympathetic blocks are utilized for the diagnosis and treatment of painful conditions in these areas.   The lumbar plexus is located on both sides of the aorta at approximately the level of the second lumbar vertebral body.  The block will be performed with you lying on your abdomen with a pillow underneath.  Using direct x-ray guidance,   The plexus will be located on both sides of the spine.  Numbing medicine will be used to deaden the skin prior to needle insertion.  In most cases, a small amount of sedation can be give by IV prior to the numbing medicine.  One or two small needles will be placed near the plexus and local anesthetic will be injected.  This may make your leg(s) feel warm.  The Entire block usually lasts about 15-25 minutes.  Conditions which may be treated by lumbar sympathetic block:   Reflex sympathetic dystrophy  Phantom limb pain  Peripheral neuropathy  Peripheral vascular disease ( inadequate blood flow )  Cancer pain of pelvis, leg and kidney  Preparation for the injection:  1. Do note eat any solid food or diary products within 8 hours of your appointment. 2. You may drink clear liquids up to 3 hours before appointment.  Clear liquids include water, black coffee, juice or soda.  No milk or cream please. 3. You may take your regular medication, including pain medications, with a sip of water before you appointment.  Diabetics should hold regular insulin ( if taken separately ) and take 1/2 NPH dose the morning of the procedure .  Carry some sugar containing items with you to your appointment. 4. A driver must accompany you and be prepared to drive you home after your procedure. 5. Bring all your current medication with you. 6. An IV may be inserted and sedation  may be given at the discretion of the physician.  7. A blood pressure cuff, EKG and other monitors will often be applied during the procedure.  Some patients may need to have extra oxygen administered for a short period. 8. You will be asked to provide medical information, including your allergies and medications, prior to the procedure.  We must know immediately if your taking blood thinners (like Coumadin/Warfarin) or if you are allergic to IV iodine contrast (dye).  We must know if you could possibly be pregnant.  Possible side-effects   Bleeding from needle site or deeper  Infection (rare, can require surgery)  Nerve injury (rare)  Numbness & tingling (temporary)  Collapsed lung (rare)  Spinal headache (a headache worse with upright posture)  Light-headedness (temporary)  Pain at injection site (several days)  Decreased blood pressure (temporary)  Weakness in legs (temporary)  Seizure or other drug reaction (rare)  Call if you experience:   Fever/chills associated with headache or increased back/ neck pain  Headache worsened by an upright position  New onset weakness or numbness of an extremity below the injection site  Hives or difficulty breathing ( go to the emergency room)  Inflammation or drainage at the injections site(s)  New symptoms which are concerning to you  Please note:  If effective, we will often do a series of 2-3 injections spaced 3-6 weeks apart to maximally decrease your pain.  If initial series is effective, you may be  a candidate for a more permanent block of the lumbar sympathetic plexus.  If you have any questions please call [phone] [facility] You were given a prescription for Oxycodone today.

## 2015-05-19 NOTE — Progress Notes (Signed)
   Subjective:    Patient ID: Kirsten Richardson, female    DOB: 05-04-1975, 40 y.o.   MRN: [id number]  HPI  This patient returns to the clinic today indicating that she is unable to have her procedure today because she has a cold and is recovering from the flu She indicates thatshe is doing otherwise quite well and that her pain is reasonably well controlled with the oxycodone 5 mg taken at night Today her subjective pain intensity rating is 50% There are no new developments in her clinical condition  Review of Systems  Constitutional: Negative.   HENT: Negative.   Eyes: Negative.   Respiratory: Negative.   Cardiovascular: Negative.   Gastrointestinal: Negative.   Endocrine: Negative.   Genitourinary: Negative.   Musculoskeletal: Positive for myalgias, back pain, joint swelling, arthralgias and gait problem. Negative for neck pain and neck stiffness.       She also has congenital scoliosis and primary osteoarthritis of the left hip along with CRPS type I of the  Left lower limb  Skin: Negative.   Allergic/Immunologic: Negative.   Neurological: Negative.   Hematological: Negative.   Psychiatric/Behavioral:       The patient has borderline personality disorder and also bipolar affective disorder with her current hypomanic state       Objective:   Physical Exam  Cardiovascular:  Today she appears to be in no particular distress Her blood pressure is 151/114 mmHg Her pulse is 99 bpm Equal and regular Heart sounds 1 and 2 were heard in all areas Temperature is 98.87F Respirations are 16 breaths per min S PO2 was 100% Chest is clinically clear There are no adventitious sounds Abdomen is soft and nontender There is no palpable organomegaly There is no significant lymphadenopathy Pupils are equal and reactive Cranial nerves are intact There are no new neurological no muscular findings  Nursing note and vitals reviewed.         Assessment & Plan:   Assessment 1 chronic  left leg and left hip pain 2 CRPS type I of the left leg 3 osteoarthritis of the left hip 4 congenital scoliosis     Plan of management 1 we will plan a left lumbar sympathetic block for her in the next few weeks 2  will give her oxycodone 5 mg to be taken at night's and give her 30 tablets 3 we will plan to do the procedure for  Her within the next 3 weeks    Established patient        Level III    Tod Persia M.D.

## 2015-06-07 ENCOUNTER — Telehealth: Payer: Self-pay | Admitting: *Deleted

## 2015-06-07 NOTE — Telephone Encounter (Signed)
I cannot recall speaking with patient. Please address this with Dr. Starling Manns and if he consents, I will review notes and accept patient

## 2015-06-07 NOTE — Telephone Encounter (Signed)
Hello, the pt called in stating that she is a current patient of Dr. Starling Manns in which she is scheduled tomorrow 06/08/15@ 8am for a procedure. She is requesting for you to take over her care, she stated that you and her had already discussed her coming back to you as a patient. Please let me know Dr. Metta Clines if this is accurate. Thanks

## 2015-06-08 ENCOUNTER — Ambulatory Visit: Payer: Commercial Managed Care - HMO | Attending: Anesthesiology | Admitting: Anesthesiology

## 2015-06-08 ENCOUNTER — Encounter: Payer: Self-pay | Admitting: Anesthesiology

## 2015-06-08 VITALS — BP 169/99 | HR 100 | Temp 98.5°F | Resp 19 | Ht 60.0 in | Wt 170.0 lb

## 2015-06-08 DIAGNOSIS — M25552 Pain in left hip: Secondary | ICD-10-CM | POA: Insufficient documentation

## 2015-06-08 DIAGNOSIS — M25572 Pain in left ankle and joints of left foot: Secondary | ICD-10-CM | POA: Diagnosis not present

## 2015-06-08 DIAGNOSIS — G8929 Other chronic pain: Secondary | ICD-10-CM

## 2015-06-08 DIAGNOSIS — G90522 Complex regional pain syndrome I of left lower limb: Secondary | ICD-10-CM | POA: Diagnosis not present

## 2015-06-08 MED ORDER — IOPAMIDOL (ISOVUE-M 200) INJECTION 41%
INTRAMUSCULAR | Status: AC
  Administered 2015-06-08: 09:00:00
  Filled 2015-06-08: qty 10

## 2015-06-08 MED ORDER — TRIAMCINOLONE ACETONIDE 40 MG/ML IJ SUSP
80.0000 mg | Freq: Once | INTRAMUSCULAR | Status: AC
  Administered 2015-06-08: 80 mg via INTRAMUSCULAR
  Filled 2015-06-08: qty 2

## 2015-06-08 MED ORDER — BUPIVACAINE HCL (PF) 0.25 % IJ SOLN
20.0000 mL | Freq: Once | INTRAMUSCULAR | Status: AC
  Administered 2015-06-08: 20 mL
  Filled 2015-06-08: qty 30

## 2015-06-08 MED ORDER — IOPAMIDOL (ISOVUE-M 200) INJECTION 41%: 20.0000 mL | INTRAMUSCULAR | Status: DC | PRN

## 2015-06-08 MED ORDER — FENTANYL CITRATE (PF) 100 MCG/2ML IJ SOLN
50.0000 ug | INTRAMUSCULAR | Status: DC
  Filled 2015-06-08: qty 2

## 2015-06-08 MED ORDER — MIDAZOLAM HCL 5 MG/5ML IJ SOLN
1.0000 mg | INTRAMUSCULAR | Status: DC
  Filled 2015-06-08: qty 5

## 2015-06-08 MED ORDER — OXYCODONE HCL 5 MG PO CAPS: 5.0000 mg | ORAL_CAPSULE | ORAL | Status: DC

## 2015-06-08 NOTE — Patient Instructions (Addendum)
Pain Management Discharge Instructions  General Discharge Instructions :  If you need to reach your doctor call: Monday-Friday 8:00 am - 4:00 pm at [phone] or toll free [phone].  After clinic hours [phone] to have operator reach doctor.  Bring all of your medication bottles to all your appointments in the pain clinic.  To cancel or reschedule your appointment with Pain Management please remember to call 24 hours in advance to avoid a fee.  Refer to the educational materials which you have been given on: General Risks, I had my Procedure. Discharge Instructions, Post Sedation.  Post Procedure Instructions:  The drugs you were given will stay in your system until tomorrow, so for the next 24 hours you should not drive, make any legal decisions or drink any alcoholic beverages.  You may eat anything you prefer, but it is better to start with liquids then soups and crackers, and gradually work up to solid foods.  Please notify your doctor immediately if you have any unusual bleeding, trouble breathing or pain that is not related to your normal pain.  Depending on the type of procedure that was done, some parts of your body may feel week and/or numb.  This usually clears up by tonight or the next day.  Walk with the use of an assistive device or accompanied by an adult for the 24 hours.  You may use ice on the affected area for the first 24 hours.  Put ice in a Ziploc bag and cover with a towel and place against area 15 minutes on 15 minutes off.  You may switch to heat after 24 hours.GENERAL RISKS AND COMPLICATIONS  What are the risk, side effects and possible complications? Generally speaking, most procedures are safe.  However, with any procedure there are risks, side effects, and the possibility of complications.  The risks and complications are dependent upon the sites that are lesioned, or the type of nerve block to be performed.  The closer the procedure is to the spine,  the more serious the risks are.  Great care is taken when placing the radio frequency needles, block needles or lesioning probes, but sometimes complications can occur. 1. Infection: Any time there is an injection through the skin, there is a risk of infection.  This is why sterile conditions are used for these blocks.  There are four possible types of infection. 1. Localized skin infection. 2. Central Nervous System Infection-This can be in the form of Meningitis, which can be deadly. 3. Epidural Infections-This can be in the form of an epidural abscess, which can cause pressure inside of the spine, causing compression of the spinal cord with subsequent paralysis. This would require an emergency surgery to decompress, and there are no guarantees that the patient would recover from the paralysis. 4. Discitis-This is an infection of the intervertebral discs.  It occurs in about 1% of discography procedures.  It is difficult to treat and it may lead to surgery.        2. Pain: the needles have to go through skin and soft tissues, will cause soreness.       3. Damage to internal structures:  The nerves to be lesioned may be near blood vessels or    other nerves which can be potentially damaged.       4. Bleeding: Bleeding is more common if the patient is taking blood thinners such as  aspirin, Coumadin, Ticiid, Plavix, etc., or if he/she have some genetic predisposition  such as   hemophilia. Bleeding into the spinal canal can cause compression of the spinal  cord with subsequent paralysis.  This would require an emergency surgery to  decompress and there are no guarantees that the patient would recover from the  paralysis.       5. Pneumothorax:  Puncturing of a lung is a possibility, every time a needle is introduced in  the area of the chest or upper back.  Pneumothorax refers to free air around the  collapsed lung(s), inside of the thoracic cavity (chest cavity).  Another two possible  complications  related to a similar event would include: Hemothorax and Chylothorax.   These are variations of the Pneumothorax, where instead of air around the collapsed  lung(s), you may have blood or chyle, respectively.       6. Spinal headaches: They may occur with any procedures in the area of the spine.       7. Persistent CSF (Cerebro-Spinal Fluid) leakage: This is a rare problem, but may occur  with prolonged intrathecal or epidural catheters either due to the formation of a fistulous  track or a dural tear.       8. Nerve damage: By working so close to the spinal cord, there is always a possibility of  nerve damage, which could be as serious as a permanent spinal cord injury with  paralysis.       9. Death:  Although rare, severe deadly allergic reactions known as "Anaphylactic  reaction" can occur to any of the medications used.      10. Worsening of the symptoms:  We can always make thing worse.  What are the chances of something like this happening? Chances of any of this occuring are extremely low.  By statistics, you have more of a chance of getting killed in a motor vehicle accident: while driving to the hospital than any of the above occurring .  Nevertheless, you should be aware that they are possibilities.  In general, it is similar to taking a shower.  Everybody knows that you can slip, hit your head and get killed.  Does that mean that you should not shower again?  Nevertheless always keep in mind that statistics do not mean anything if you happen to be on the wrong side of them.  Even if a procedure has a 1 (one) in a 1,000,000 (million) chance of going wrong, it you happen to be that one..Also, keep in mind that by statistics, you have more of a chance of having something go wrong when taking medications.  Who should not have this procedure? If you are on a blood thinning medication (e.g. Coumadin, Plavix, see list of "Blood Thinners"), or if you have an active infection going on, you should not  have the procedure.  If you are taking any blood thinners, please inform your physician.  How should I prepare for this procedure?  Do not eat or drink anything at least six hours prior to the procedure.  Bring a driver with you .  It cannot be a taxi.  Come accompanied by an adult that can drive you back, and that is strong enough to help you if your legs get weak or numb from the local anesthetic.  Take all of your medicines the morning of the procedure with just enough water to swallow them.  If you have diabetes, make sure that you are scheduled to have your procedure done first thing in the morning, whenever possible.  If you have diabetes,   take only half of your insulin dose and notify our nurse that you have done so as soon as you arrive at the clinic.  If you are diabetic, but only take blood sugar pills (oral hypoglycemic), then do not take them on the morning of your procedure.  You may take them after you have had the procedure.  Do not take aspirin or any aspirin-containing medications, at least eleven (11) days prior to the procedure.  They may prolong bleeding.  Wear loose fitting clothing that may be easy to take off and that you would not mind if it got stained with Betadine or blood.  Do not wear any jewelry or perfume  Remove any nail coloring.  It will interfere with some of our monitoring equipment.  NOTE: Remember that this is not meant to be interpreted as a complete list of all possible complications.  Unforeseen problems may occur.  BLOOD THINNERS The following drugs contain aspirin or other products, which can cause increased bleeding during surgery and should not be taken for 2 weeks prior to and 1 week after surgery.  If you should need take something for relief of minor pain, you may take acetaminophen which is found in Tylenol,m Datril, Anacin-3 and Panadol. It is not blood thinner. The products listed below are.  Do not take any of the products listed below  in addition to any listed on your instruction sheet.  A.P.C or A.P.C with Codeine Codeine Phosphate Capsules #3 Ibuprofen Ridaura  ABC compound Congesprin Imuran rimadil  Advil Cope Indocin Robaxisal  Alka-Seltzer Effervescent Pain Reliever and Antacid Coricidin or Coricidin-D  Indomethacin Rufen  Alka-Seltzer plus Cold Medicine Cosprin Ketoprofen S-A-C Tablets  Anacin Analgesic Tablets or Capsules Coumadin Korlgesic Salflex  Anacin Extra Strength Analgesic tablets or capsules CP-2 Tablets Lanoril Salicylate  Anaprox Cuprimine Capsules Levenox Salocol  Anexsia-D Dalteparin Magan Salsalate  Anodynos Darvon compound Magnesium Salicylate Sine-off  Ansaid Dasin Capsules Magsal Sodium Salicylate  Anturane Depen Capsules Marnal Soma  APF Arthritis pain formula Dewitt's Pills Measurin Stanback  Argesic Dia-Gesic Meclofenamic Sulfinpyrazone  Arthritis Bayer Timed Release Aspirin Diclofenac Meclomen Sulindac  Arthritis pain formula Anacin Dicumarol Medipren Supac  Analgesic (Safety coated) Arthralgen Diffunasal Mefanamic Suprofen  Arthritis Strength Bufferin Dihydrocodeine Mepro Compound Suprol  Arthropan liquid Dopirydamole Methcarbomol with Aspirin Synalgos  ASA tablets/Enseals Disalcid Micrainin Tagament  Ascriptin Doan's Midol Talwin  Ascriptin A/D Dolene Mobidin Tanderil  Ascriptin Extra Strength Dolobid Moblgesic Ticlid  Ascriptin with Codeine Doloprin or Doloprin with Codeine Momentum Tolectin  Asperbuf Duoprin Mono-gesic Trendar  Aspergum Duradyne Motrin or Motrin IB Triminicin  Aspirin plain, buffered or enteric coated Durasal Myochrisine Trigesic  Aspirin Suppositories Easprin Nalfon Trillsate  Aspirin with Codeine Ecotrin Regular or Extra Strength Naprosyn Uracel  Atromid-S Efficin Naproxen Ursinus  Auranofin Capsules Elmiron Neocylate Vanquish  Axotal Emagrin Norgesic Verin  Azathioprine Empirin or Empirin with Codeine Normiflo Vitamin E  Azolid Emprazil Nuprin Voltaren  Bayer  Aspirin plain, buffered or children's or timed BC Tablets or powders Encaprin Orgaran Warfarin Sodium  Buff-a-Comp Enoxaparin Orudis Zorpin  Buff-a-Comp with Codeine Equegesic Os-Cal-Gesic   Buffaprin Excedrin plain, buffered or Extra Strength Oxalid   Bufferin Arthritis Strength Feldene Oxphenbutazone   Bufferin plain or Extra Strength Feldene Capsules Oxycodone with Aspirin   Bufferin with Codeine Fenoprofen Fenoprofen Pabalate or Pabalate-SF   Buffets II Flogesic Panagesic   Buffinol plain or Extra Strength Florinal or Florinal with Codeine Panwarfarin   Buf-Tabs Flurbiprofen Penicillamine   Butalbital Compound Four-way cold tablets   Penicillin   Butazolidin Fragmin Pepto-Bismol   Carbenicillin Geminisyn Percodan   Carna Arthritis Reliever Geopen Persantine   Carprofen Gold's salt Persistin   Chloramphenicol Goody's Phenylbutazone   Chloromycetin Haltrain Piroxlcam   Clmetidine heparin Plaquenil   Cllnoril Hyco-pap Ponstel   Clofibrate Hydroxy chloroquine Propoxyphen         Before stopping any of these medications, be sure to consult the physician who ordered them.  Some, such as Coumadin (Warfarin) are ordered to prevent or treat serious conditions such as "deep thrombosis", "pumonary embolisms", and other heart problems.  The amount of time that you may need off of the medication may also vary with the medication and the reason for which you were taking it.  If you are taking any of these medications, please make sure you notify your pain physician before you undergo any procedures.         Pain Management Discharge Instructions  General Discharge Instructions :  If you need to reach your doctor call: Monday-Friday 8:00 am - 4:00 pm at [phone] or toll free [phone].  After clinic hours [phone] to have operator reach doctor.  Bring all of your medication bottles to all your appointments in the pain clinic.  To cancel or reschedule your appointment with Pain  Management please remember to call 24 hours in advance to avoid a fee.  Refer to the educational materials which you have been given on: General Risks, I had my Procedure. Discharge Instructions, Post Sedation.  Post Procedure Instructions:  The drugs you were given will stay in your system until tomorrow, so for the next 24 hours you should not drive, make any legal decisions or drink any alcoholic beverages.  You may eat anything you prefer, but it is better to start with liquids then soups and crackers, and gradually work up to solid foods.  Please notify your doctor immediately if you have any unusual bleeding, trouble breathing or pain that is not related to your normal pain.  Depending on the type of procedure that was done, some parts of your body may feel week and/or numb.  This usually clears up by tonight or the next day.  Walk with the use of an assistive device or accompanied by an adult for the 24 hours.  You may use ice on the affected area for the first 24 hours.  Put ice in a Ziploc bag and cover with a towel and place against area 15 minutes on 15 minutes off.  You may switch to heat after 24 hours.GENERAL RISKS AND COMPLICATIONS  What are the risk, side effects and possible complications? Generally speaking, most procedures are safe.  However, with any procedure there are risks, side effects, and the possibility of complications.  The risks and complications are dependent upon the sites that are lesioned, or the type of nerve block to be performed.  The closer the procedure is to the spine, the more serious the risks are.  Great care is taken when placing the radio frequency needles, block needles or lesioning probes, but sometimes complications can occur. 2. Infection: Any time there is an injection through the skin, there is a risk of infection.  This is why sterile conditions are used for these blocks.  There are four possible types of infection. 1. Localized skin  infection. 2. Central Nervous System Infection-This can be in the form of Meningitis, which can be deadly. 3. Epidural Infections-This can be in the form of an epidural abscess, which can cause pressure  inside of the spine, causing compression of the spinal cord with subsequent paralysis. This would require an emergency surgery to decompress, and there are no guarantees that the patient would recover from the paralysis. 4. Discitis-This is an infection of the intervertebral discs.  It occurs in about 1% of discography procedures.  It is difficult to treat and it may lead to surgery.        2. Pain: the needles have to go through skin and soft tissues, will cause soreness.       3. Damage to internal structures:  The nerves to be lesioned may be near blood vessels or    other nerves which can be potentially damaged.       4. Bleeding: Bleeding is more common if the patient is taking blood thinners such as  aspirin, Coumadin, Ticiid, Plavix, etc., or if he/she have some genetic predisposition  such as hemophilia. Bleeding into the spinal canal can cause compression of the spinal  cord with subsequent paralysis.  This would require an emergency surgery to  decompress and there are no guarantees that the patient would recover from the  paralysis.       5. Pneumothorax:  Puncturing of a lung is a possibility, every time a needle is introduced in  the area of the chest or upper back.  Pneumothorax refers to free air around the  collapsed lung(s), inside of the thoracic cavity (chest cavity).  Another two possible  complications related to a similar event would include: Hemothorax and Chylothorax.   These are variations of the Pneumothorax, where instead of air around the collapsed  lung(s), you may have blood or chyle, respectively.       6. Spinal headaches: They may occur with any procedures in the area of the spine.       7. Persistent CSF (Cerebro-Spinal Fluid) leakage: This is a rare problem, but may occur   with prolonged intrathecal or epidural catheters either due to the formation of a fistulous  track or a dural tear.       8. Nerve damage: By working so close to the spinal cord, there is always a possibility of  nerve damage, which could be as serious as a permanent spinal cord injury with  paralysis.       9. Death:  Although rare, severe deadly allergic reactions known as "Anaphylactic  reaction" can occur to any of the medications used.      10. Worsening of the symptoms:  We can always make thing worse.  What are the chances of something like this happening? Chances of any of this occuring are extremely low.  By statistics, you have more of a chance of getting killed in a motor vehicle accident: while driving to the hospital than any of the above occurring .  Nevertheless, you should be aware that they are possibilities.  In general, it is similar to taking a shower.  Everybody knows that you can slip, hit your head and get killed.  Does that mean that you should not shower again?  Nevertheless always keep in mind that statistics do not mean anything if you happen to be on the wrong side of them.  Even if a procedure has a 1 (one) in a 1,000,000 (million) chance of going wrong, it you happen to be that one..Also, keep in mind that by statistics, you have more of a chance of having something go wrong when taking medications.  Who should not have this procedure?  If you are on a blood thinning medication (e.g. Coumadin, Plavix, see list of "Blood Thinners"), or if you have an active infection going on, you should not have the procedure.  If you are taking any blood thinners, please inform your physician.  How should I prepare for this procedure?  Do not eat or drink anything at least six hours prior to the procedure.  Bring a driver with you .  It cannot be a taxi.  Come accompanied by an adult that can drive you back, and that is strong enough to help you if your legs get weak or numb from the  local anesthetic.  Take all of your medicines the morning of the procedure with just enough water to swallow them.  If you have diabetes, make sure that you are scheduled to have your procedure done first thing in the morning, whenever possible.  If you have diabetes, take only half of your insulin dose and notify our nurse that you have done so as soon as you arrive at the clinic.  If you are diabetic, but only take blood sugar pills (oral hypoglycemic), then do not take them on the morning of your procedure.  You may take them after you have had the procedure.  Do not take aspirin or any aspirin-containing medications, at least eleven (11) days prior to the procedure.  They may prolong bleeding.  Wear loose fitting clothing that may be easy to take off and that you would not mind if it got stained with Betadine or blood.  Do not wear any jewelry or perfume  Remove any nail coloring.  It will interfere with some of our monitoring equipment.  NOTE: Remember that this is not meant to be interpreted as a complete list of all possible complications.  Unforeseen problems may occur.  BLOOD THINNERS The following drugs contain aspirin or other products, which can cause increased bleeding during surgery and should not be taken for 2 weeks prior to and 1 week after surgery.  If you should need take something for relief of minor pain, you may take acetaminophen which is found in Tylenol,m Datril, Anacin-3 and Panadol. It is not blood thinner. The products listed below are.  Do not take any of the products listed below in addition to any listed on your instruction sheet.  A.P.C or A.P.C with Codeine Codeine Phosphate Capsules #3 Ibuprofen Ridaura  ABC compound Congesprin Imuran rimadil  Advil Cope Indocin Robaxisal  Alka-Seltzer Effervescent Pain Reliever and Antacid Coricidin or Coricidin-D  Indomethacin Rufen  Alka-Seltzer plus Cold Medicine Cosprin Ketoprofen S-A-C Tablets  Anacin Analgesic  Tablets or Capsules Coumadin Korlgesic Salflex  Anacin Extra Strength Analgesic tablets or capsules CP-2 Tablets Lanoril Salicylate  Anaprox Cuprimine Capsules Levenox Salocol  Anexsia-D Dalteparin Magan Salsalate  Anodynos Darvon compound Magnesium Salicylate Sine-off  Ansaid Dasin Capsules Magsal Sodium Salicylate  Anturane Depen Capsules Marnal Soma  APF Arthritis pain formula Dewitt's Pills Measurin Stanback  Argesic Dia-Gesic Meclofenamic Sulfinpyrazone  Arthritis Bayer Timed Release Aspirin Diclofenac Meclomen Sulindac  Arthritis pain formula Anacin Dicumarol Medipren Supac  Analgesic (Safety coated) Arthralgen Diffunasal Mefanamic Suprofen  Arthritis Strength Bufferin Dihydrocodeine Mepro Compound Suprol  Arthropan liquid Dopirydamole Methcarbomol with Aspirin Synalgos  ASA tablets/Enseals Disalcid Micrainin Tagament  Ascriptin Doan's Midol Talwin  Ascriptin A/D Dolene Mobidin Tanderil  Ascriptin Extra Strength Dolobid Moblgesic Ticlid  Ascriptin with Codeine Doloprin or Doloprin with Codeine Momentum Tolectin  Asperbuf Duoprin Mono-gesic Trendar  Aspergum Duradyne Motrin or Motrin IB Triminicin  Aspirin plain, buffered or enteric coated Durasal Myochrisine Trigesic  Aspirin Suppositories Easprin Nalfon Trillsate  Aspirin with Codeine Ecotrin Regular or Extra Strength Naprosyn Uracel  Atromid-S Efficin Naproxen Ursinus  Auranofin Capsules Elmiron Neocylate Vanquish  Axotal Emagrin Norgesic Verin  Azathioprine Empirin or Empirin with Codeine Normiflo Vitamin E  Azolid Emprazil Nuprin Voltaren  Bayer Aspirin plain, buffered or children's or timed BC Tablets or powders Encaprin Orgaran Warfarin Sodium  Buff-a-Comp Enoxaparin Orudis Zorpin  Buff-a-Comp with Codeine Equegesic Os-Cal-Gesic   Buffaprin Excedrin plain, buffered or Extra Strength Oxalid   Bufferin Arthritis Strength Feldene Oxphenbutazone   Bufferin plain or Extra Strength Feldene Capsules Oxycodone with Aspirin    Bufferin with Codeine Fenoprofen Fenoprofen Pabalate or Pabalate-SF   Buffets II Flogesic Panagesic   Buffinol plain or Extra Strength Florinal or Florinal with Codeine Panwarfarin   Buf-Tabs Flurbiprofen Penicillamine   Butalbital Compound Four-way cold tablets Penicillin   Butazolidin Fragmin Pepto-Bismol   Carbenicillin Geminisyn Percodan   Carna Arthritis Reliever Geopen Persantine   Carprofen Gold's salt Persistin   Chloramphenicol Goody's Phenylbutazone   Chloromycetin Haltrain Piroxlcam   Clmetidine heparin Plaquenil   Cllnoril Hyco-pap Ponstel   Clofibrate Hydroxy chloroquine Propoxyphen         Before stopping any of these medications, be sure to consult the physician who ordered them.  Some, such as Coumadin (Warfarin) are ordered to prevent or treat serious conditions such as "deep thrombosis", "pumonary embolisms", and other heart problems.  The amount of time that you may need off of the medication may also vary with the medication and the reason for which you were taking it.  If you are taking any of these medications, please make sure you notify your pain physician before you undergo any procedures.

## 2015-06-08 NOTE — Progress Notes (Signed)
Safety precautions to be maintained throughout the outpatient stay will include: orient to surroundings, keep bed in low position, maintain call bell within reach at all times, provide assistance with transfer out of bed and ambulation.  

## 2015-06-08 NOTE — Procedures (Signed)
Date of procedure:  06/08/2015  Preoperative Diagnosis:  1 pain in left ankle and left foot 2 pain in left hip 3 CRPS of left lower extremity  Postoperative Diagnosis: Same.  Procedure: 1. left lumbar sympathetic block at L2 and L4.  2. Fluoroscopic guidance.  Surgeon: Tod Persia, MD  Anesthesia: MAC anesthesia by the nurse and staff under my direction  Informed consent was obtained and the patient appeared to accept and understand the benefits and risks of this procedure.   Pre procedure comments:  None  Description of the Procedure:  The patient was taken to the operating room and placed in the prone position.  Intravenous sedation and MAC anesthesia was administered by the nurse and staff under my direction.  After adequate draping, the lumbar spine was viewed fluoroscopically.  The oblique view was activated to the point just when the transverse processes of L2 and L4 were visualized.  The targe area for local infiltration and needle placement was the point at which the inferior border of the transverse process crossed the lateral border of the body of L2 and L4 on the left side.  Those two target areas were infiltrated with 3 cc of 1% Lidocaine used in a 25 gauge needle.  Then two #22 gauge 3 inch needles were inserted using oblique view and tunnel vision, these were directed just towards that point of intersection between  The inferior border of the transverse process and the body of L2 and L4.  Lateral fluoroscopic views were used to assess the depths of needle insertion so that the needle was at the point of the anterolateral aspect of the body of L2 and L4 on the left side.  Aspirations ere negative for blood, CSF and other body fluids.  1 cc of Omnipaque 300 was injected into the needles at L2 an L4.  Adequate distribution of the dye was observed without any intravascular washout configurations.    Then 10cc of 0.25% Bupivacaine and 40 mg of Kenalog were injected at L2  while 10 cc of 0.25% Bupivacaine and 40 mg of Kenalog were injected at the L4 level.  Post injection fluoroscopy showed adequate washout of the contrast media.  Both needles were removed and adequate hemostasis was established .  The patient tolerated the procedure quite well and vital signs are stable.  There were no adverse effects.  Additional comments: This procedure was done using fluoroscopic guidance Fluoroscopic time was 1.3 minutes The number of fluoroscopic frames were 5 MG Y was 26.7  The patient was taken to the recovery room in satisfactory condition where the patient was observed and subsequently discharged.  We'll give the patient oxycodone 5 mg to be taken at night's # 30 tablet  Will follow up in the office in 1 month  Tod Persia M.D.

## 2015-06-11 ENCOUNTER — Telehealth: Payer: Self-pay

## 2015-06-11 NOTE — Telephone Encounter (Signed)
States no relief from procedure= instructed her that it could take a few more days and t keep appt on June 30,2017

## 2015-07-06 ENCOUNTER — Ambulatory Visit: Payer: Medicare Other | Attending: Anesthesiology | Admitting: Anesthesiology

## 2015-07-06 ENCOUNTER — Encounter: Payer: Self-pay | Admitting: Anesthesiology

## 2015-07-06 VITALS — BP 153/108 | HR 118 | Temp 99.0°F | Resp 16 | Ht 60.0 in | Wt 170.0 lb

## 2015-07-06 DIAGNOSIS — G90529 Complex regional pain syndrome I of unspecified lower limb: Secondary | ICD-10-CM | POA: Insufficient documentation

## 2015-07-06 DIAGNOSIS — M545 Low back pain: Secondary | ICD-10-CM | POA: Insufficient documentation

## 2015-07-06 DIAGNOSIS — M25552 Pain in left hip: Secondary | ICD-10-CM

## 2015-07-06 DIAGNOSIS — M79605 Pain in left leg: Secondary | ICD-10-CM | POA: Diagnosis not present

## 2015-07-06 DIAGNOSIS — Q675 Congenital deformity of spine: Secondary | ICD-10-CM

## 2015-07-06 DIAGNOSIS — F319 Bipolar disorder, unspecified: Secondary | ICD-10-CM | POA: Diagnosis not present

## 2015-07-06 DIAGNOSIS — M5136 Other intervertebral disc degeneration, lumbar region: Secondary | ICD-10-CM | POA: Insufficient documentation

## 2015-07-06 DIAGNOSIS — G8929 Other chronic pain: Secondary | ICD-10-CM | POA: Diagnosis present

## 2015-07-06 DIAGNOSIS — G90522 Complex regional pain syndrome I of left lower limb: Secondary | ICD-10-CM

## 2015-07-06 DIAGNOSIS — M25572 Pain in left ankle and joints of left foot: Secondary | ICD-10-CM

## 2015-07-06 MED ORDER — OXYCODONE HCL 5 MG PO CAPS: 5.0000 mg | ORAL_CAPSULE | ORAL | Status: AC

## 2015-07-06 NOTE — Patient Instructions (Signed)
You were given a prescription for Oxycodone today (2 weeks  Supply)

## 2015-07-06 NOTE — Progress Notes (Signed)
Patient here for f/up evaluation from procedure for foot pain.  Patient is currently experiencing a flare up of back pain for which she would like to discuss.  Patients BP is elevated today.  Safety precautions to be maintained throughout the outpatient stay will include: orient to surroundings, keep bed in low position, maintain call bell within reach at all times, provide assistance with transfer out of bed and ambulation.

## 2015-07-07 NOTE — Progress Notes (Signed)
   Subjective:    Patient ID: Kirsten Richardson, female    DOB: Feb 27, 1975, 40 y.o.   MRN: [id number]  HPI  This patient returned to the clinic today indicating that she was doing well She indicated that the lumbar sympathetic block helped her leg pain but now her back pain was creating some  Problems She requested a caudal epidural steroid injection at her next visit I agreed to her request She indicates that she continues to use the Roxicodone 5 m and this is allowing her to have a good night sleep  Review of Systems  Constitutional: Negative.   HENT: Negative.   Eyes: Negative.   Respiratory: Negative.   Cardiovascular: Negative.   Gastrointestinal: Negative.   Endocrine: Negative.   Genitourinary: Negative.   Musculoskeletal: Negative.        She also has complex regional pain syndrome of the lower extremities type I  Skin: Negative.   Allergic/Immunologic: Negative.   Neurological: Negative.   Hematological: Negative.   Psychiatric/Behavioral:       This patient has bipolar disorder       Objective:   Physical Exam  Cardiovascular:  The patient appears to be in no distress Her vital signs suggests some diastolic hypretension Her pulse is 78 bpm Temperature is 98.62F Respirations are 16 breaths per minute SPO2 is 98% There is moderate tenderness in the L4 L5 S1 area of the low back area There are no new neurological nor musculoskeletal findings  Nursing note and vitals reviewed.         Assessment & Plan:   Assessment 1 chronic low back pain 2 lumbar degenerative disc disease  3 chronic left leg pain 4 CRPS type I of the lower extremity    Plan of management We will continue the patient on Roxicodone 5  And gave her 15 tablets We will plan a caudal epidural steroid injection for    Established patient    Level III      Tod Persia M.D.

## 2015-07-30 ENCOUNTER — Telehealth: Payer: Self-pay | Admitting: *Deleted

## 2015-07-30 ENCOUNTER — Other Ambulatory Visit: Payer: Self-pay | Admitting: Pain Medicine

## 2015-07-30 NOTE — Telephone Encounter (Signed)
Nurses and Secretaries, I will see patient if this is alright with Dr Starling Manns  Thank you

## 2015-07-30 NOTE — Telephone Encounter (Signed)
Nurses and Secretaries, This is not my patient Please let me know if you need for me to assist with this patient  Thank you

## 2015-07-30 NOTE — Telephone Encounter (Signed)
Dr. Metta Clines, please read the note. The question is for you.

## 2015-07-31 NOTE — Telephone Encounter (Signed)
Dr. Metta Clines has agreed to see Ms. Gassen. Will you get her transferred to him?

## 2015-07-31 NOTE — Telephone Encounter (Signed)
Dr. Metta Clines,  Spoke with Chip Boer about this, Dr. Starling Manns is resigned until further notice. His patients can see other physicians. I will have secretaries get her transferred to your care.

## 2015-07-31 NOTE — Telephone Encounter (Signed)
Dena and Nurses Dutch Quint and Olegario Messier Please schedule patient as a new patient for evaluation when schedule allows. Thank you

## 2015-07-31 NOTE — Telephone Encounter (Signed)
Kirsten Richardson please schedule patient as New Patient per Dr Metta Clines

## 2015-08-01 NOTE — Telephone Encounter (Signed)
SW PT GAVE APPT FOR 08/14/15 @ 11AM...Marland KitchenMarland KitchenTD

## 2015-08-02 ENCOUNTER — Encounter: Payer: Self-pay | Admitting: Pain Medicine

## 2015-08-02 ENCOUNTER — Ambulatory Visit: Payer: Medicare Other | Attending: Pain Medicine | Admitting: Pain Medicine

## 2015-08-02 VITALS — BP 122/65 | HR 78 | Temp 98.3°F | Resp 15 | Ht 60.0 in | Wt 170.0 lb

## 2015-08-02 DIAGNOSIS — M545 Low back pain: Secondary | ICD-10-CM | POA: Diagnosis present

## 2015-08-02 DIAGNOSIS — G90522 Complex regional pain syndrome I of left lower limb: Secondary | ICD-10-CM | POA: Insufficient documentation

## 2015-08-02 DIAGNOSIS — M722 Plantar fascial fibromatosis: Secondary | ICD-10-CM | POA: Insufficient documentation

## 2015-08-02 DIAGNOSIS — M47812 Spondylosis without myelopathy or radiculopathy, cervical region: Secondary | ICD-10-CM

## 2015-08-02 DIAGNOSIS — M47816 Spondylosis without myelopathy or radiculopathy, lumbar region: Secondary | ICD-10-CM

## 2015-08-02 DIAGNOSIS — Z8781 Personal history of (healed) traumatic fracture: Secondary | ICD-10-CM | POA: Diagnosis not present

## 2015-08-02 DIAGNOSIS — Z9889 Other specified postprocedural states: Secondary | ICD-10-CM | POA: Diagnosis not present

## 2015-08-02 DIAGNOSIS — M533 Sacrococcygeal disorders, not elsewhere classified: Secondary | ICD-10-CM

## 2015-08-02 DIAGNOSIS — K589 Irritable bowel syndrome without diarrhea: Secondary | ICD-10-CM | POA: Diagnosis not present

## 2015-08-02 DIAGNOSIS — M5481 Occipital neuralgia: Secondary | ICD-10-CM

## 2015-08-02 DIAGNOSIS — F319 Bipolar disorder, unspecified: Secondary | ICD-10-CM | POA: Diagnosis not present

## 2015-08-02 DIAGNOSIS — M25552 Pain in left hip: Secondary | ICD-10-CM | POA: Diagnosis present

## 2015-08-02 DIAGNOSIS — M160 Bilateral primary osteoarthritis of hip: Secondary | ICD-10-CM | POA: Diagnosis not present

## 2015-08-02 DIAGNOSIS — M5030 Other cervical disc degeneration, unspecified cervical region: Secondary | ICD-10-CM

## 2015-08-02 DIAGNOSIS — M5136 Other intervertebral disc degeneration, lumbar region: Secondary | ICD-10-CM | POA: Insufficient documentation

## 2015-08-02 DIAGNOSIS — M419 Scoliosis, unspecified: Secondary | ICD-10-CM | POA: Diagnosis not present

## 2015-08-02 DIAGNOSIS — M4120 Other idiopathic scoliosis, site unspecified: Secondary | ICD-10-CM

## 2015-08-02 DIAGNOSIS — M25551 Pain in right hip: Secondary | ICD-10-CM | POA: Diagnosis present

## 2015-08-02 MED ORDER — OXYCODONE-ACETAMINOPHEN 5-325 MG PO TABS: ORAL_TABLET | ORAL | 0 refills | Status: DC

## 2015-08-02 MED ORDER — TRAMADOL HCL 50 MG PO TABS: ORAL_TABLET | ORAL | 0 refills | Status: DC

## 2015-08-02 NOTE — Progress Notes (Signed)
The patient is a 40 year old female who comes to pain management at the request of Dr. Terance Hart for further evaluation and treatment of pain involving the lower back lower extremity region especially the region of the hips. The patient also admits to pain involving the neck and upper extremity regions. The patient states that her pain is aggravated by bending climbing intercourse kneeling lifting motion sitting standing squatting stooping twisting The patient stated that the pain was made worse following surgery. The patient stated that the pain was aching agonizing annoying burning constant disabling distressing dreadful feeling of getting longer and horrible sensation with nagging pressure-like sensation which was also sharp shooting stabbing tender tiring uncouple sensation. The patient stated the pain awakened her from sleep and interfered with ability to go to sleep. The patient stated the pain associated with spasms as well as ability to concentrate and difficulty controlling bowel and bladder. The patient denied any recent trauma. The patient also is with history of fractures of both hips which required surgical intervention with open reduction internal fixation of the fractures. The patient continues to have significant pain of both hips especially the left hip. The patient also is status post sprain of the left ankle with severe pain of the ankle occurring as well. We discussed patient's condition and we will proceed with interventional treatment of pain involving the left ankle and left lower extremity time of return appointment with concern regarding component of patient's pain being due to complex regional pain syndrome. We reviewed medications prescribe Dr.arris for the patient and patient was given prescription for tramadol and hydrocodone acetaminophen on today's visit. We informed patient that we will remain available to consider additional modifications of treatment regimen pending results  of urine drug screen and follow-up evaluation in pain management Center as well as review of additional records. The patient was with understanding and agreement suggested treatment plan.      Cardiovascular: High blood pressure  Pulmonary: Emphysema  Neurological: Scoliosis  Psychological: Anxiety Depression Panic attacks Prior history of attempted suicide  Gastrointestinal: Gastroesophageal reflux disease Irritable bowel syndrome  Genitourinary: Recurrent urinary tract infection  Hematologic: Unremarkable  Endocrine: Hypothyroidism  Rheumatological: Unremarkable  Musculoskeletal: Unremarkable  Other significant: Unremarkable      Physical examination   There was tends to palpation of the splenius capitis and occipitalis region of moderate degree with moderate tenderness of the cervical facet cervical paraspinal musculature regions. Palpation of the acromioclavicular and glenohumeral joint regions reproduce moderate discomfort. The patient had difficulty performing drop test. There appeared to be unremarkable Spurling's maneuver. The patient was with decreased grip strength without significant pain with Tinel and Phalen's maneuver. Palpation over the thoracic region was attends to palpation of muscle spasm without crepitus of the thoracic region noted. Palpation over the lumbar region was with increased pain of moderate to moderately severe degree with lateral bending rotation extension and palpation over the lumbar facets reproducing moderate discomfort. There was moderate to moderately severe tenderness of the PSIS and PII S regions. Palpation over the greater trochanteric region iliotibial band region reproduced moderate discomfort as well. Straight leg raising was tolerates approximately 20 without increased pain with dorsiflexion noted. There was tenderness to palpation of the knees with crepitus of the knees with negative anterior and posterior drawer signs.  DTRs were difficult to elicit patient had difficulty relaxing.EHL strength appeared to be decreased. No definite sensory deficit or dermatomal dystrophy detected. There was negative clonus negative Homans the abdomen was nontender with  no costovertebral tenderness noted     Assessment  Degenerative disc disease lumbar spine  Lumbar facet syndrome  Complex regional pain syndrome of left lower extremity (status post sprain of left ankle)  Degenerative disc disease cervical spine  Cervical facet syndrome  Status post surgery of hips with prior history of fracture of hips with placement of rods  Osteoarthritis of hips  Scoliosis  Plantar fasciitis  Irritable bowel syndrome  Bipolar disorder     PLAN  Continue present medications tramadol and oxycodone CAUTION Medication can cause respiratory depression and cause you to stop breathing, cause excessive sedation, cause confusion and other side effects.  Exercise extreme caution when taking medication and call EMS or go to the Emergency Department immediately if you develop any of these symptoms   Lumbar sympathetic block to be performed at time of return appointment  F/U PCP Dr. Yates Decamp III  for evaluation of  BP and general medical  Condition  Ask the nurses and secretary the day of your MRI of the cervical region and neurosurgical evaluation  F/U surgical evaluation. Patient will follow-up with Dr. Joice Lofts  Or other  surgeon as needed  F/U neurological evaluation. May consider PNCV/EMG studies and other studies pending follow-up evaluations  F/U psych evaluation with Dr. Marguerite Olea as planned  F/U pulmonary evaluation with Dr. Meredeth Ide  May consider radiofrequency rhizolysis or intraspinal procedures pending response to present treatment and F/U evaluation   Patient to call Pain Management Center should patient have concerns prior to scheduled return appointment.

## 2015-08-02 NOTE — Patient Instructions (Addendum)
PLAN  Continue present medications tramadol and oxycodone CAUTION Medication can cause respiratory depression and cause you to stop breathing, cause excessive sedation, cause confusion and other side effects.  Exercise extreme caution when taking medication and call EMS or go to the Emergency Department immediately if you develop any of these symptoms   Lumbar sympathetic block to be performed at time of return appointment  F/U PCP Dr. Yates Decamp III  for evaluation of  BP and general medical  Condition  Ask the nurses and secretary the day of your MRI of the cervical region  F/U surgical evaluation. Patient will follow-up with Dr. Joice Lofts  Or other  surgeon as needed  F/U neurological evaluation. May consider PNCV/EMG studies and other studies pending follow-up evaluations  F/U psych evaluation with Dr. Marguerite Olea as planned  F/U pulmonary evaluation with Dr. Meredeth Ide  May consider radiofrequency rhizolysis or intraspinal procedures pending response to present treatment and F/U evaluation   Patient to call Pain Management Center should patient have concerns prior to scheduled return appointment.

## 2015-08-03 ENCOUNTER — Encounter: Payer: Medicaid Other | Admitting: Anesthesiology

## 2015-08-08 ENCOUNTER — Ambulatory Visit: Payer: Medicare Other | Attending: Pain Medicine | Admitting: Pain Medicine

## 2015-08-08 ENCOUNTER — Encounter: Payer: Self-pay | Admitting: Pain Medicine

## 2015-08-08 VITALS — BP 157/98 | HR 85 | Temp 98.2°F | Resp 14 | Ht 60.0 in | Wt 170.0 lb

## 2015-08-08 DIAGNOSIS — M25572 Pain in left ankle and joints of left foot: Secondary | ICD-10-CM | POA: Diagnosis not present

## 2015-08-08 DIAGNOSIS — M79605 Pain in left leg: Secondary | ICD-10-CM | POA: Diagnosis present

## 2015-08-08 DIAGNOSIS — M5136 Other intervertebral disc degeneration, lumbar region: Secondary | ICD-10-CM

## 2015-08-08 DIAGNOSIS — F310 Bipolar disorder, current episode hypomanic: Secondary | ICD-10-CM

## 2015-08-08 DIAGNOSIS — M47816 Spondylosis without myelopathy or radiculopathy, lumbar region: Secondary | ICD-10-CM

## 2015-08-08 DIAGNOSIS — M79604 Pain in right leg: Secondary | ICD-10-CM | POA: Insufficient documentation

## 2015-08-08 DIAGNOSIS — M1612 Unilateral primary osteoarthritis, left hip: Secondary | ICD-10-CM

## 2015-08-08 DIAGNOSIS — M533 Sacrococcygeal disorders, not elsewhere classified: Secondary | ICD-10-CM

## 2015-08-08 DIAGNOSIS — Q675 Congenital deformity of spine: Secondary | ICD-10-CM

## 2015-08-08 DIAGNOSIS — M5030 Other cervical disc degeneration, unspecified cervical region: Secondary | ICD-10-CM

## 2015-08-08 DIAGNOSIS — G90522 Complex regional pain syndrome I of left lower limb: Secondary | ICD-10-CM

## 2015-08-08 DIAGNOSIS — M4120 Other idiopathic scoliosis, site unspecified: Secondary | ICD-10-CM

## 2015-08-08 DIAGNOSIS — M47812 Spondylosis without myelopathy or radiculopathy, cervical region: Secondary | ICD-10-CM

## 2015-08-08 DIAGNOSIS — M5481 Occipital neuralgia: Secondary | ICD-10-CM

## 2015-08-08 MED ORDER — LIDOCAINE HCL (PF) 1 % IJ SOLN
10.0000 mL | Freq: Once | INTRAMUSCULAR | Status: AC
  Administered 2015-08-08: 10 mL via SUBCUTANEOUS
  Filled 2015-08-08: qty 10

## 2015-08-08 MED ORDER — BUPIVACAINE HCL (PF) 0.25 % IJ SOLN
30.0000 mL | Freq: Once | INTRAMUSCULAR | Status: AC
  Administered 2015-08-08: 30 mL
  Filled 2015-08-08: qty 30

## 2015-08-08 MED ORDER — MIDAZOLAM HCL 5 MG/5ML IJ SOLN
5.0000 mg | Freq: Once | INTRAMUSCULAR | Status: AC
  Administered 2015-08-08: 5 mg via INTRAVENOUS
  Filled 2015-08-08: qty 5

## 2015-08-08 MED ORDER — ORPHENADRINE CITRATE 30 MG/ML IJ SOLN
60.0000 mg | Freq: Once | INTRAMUSCULAR | Status: DC
  Filled 2015-08-08: qty 2

## 2015-08-08 MED ORDER — CEFAZOLIN SODIUM 1 G IJ SOLR
INTRAMUSCULAR | Status: AC
  Administered 2015-08-08: 1 g
  Filled 2015-08-08: qty 10

## 2015-08-08 MED ORDER — CEFUROXIME AXETIL 250 MG PO TABS: 250.0000 mg | ORAL_TABLET | Freq: Two times a day (BID) | ORAL | 0 refills | Status: DC

## 2015-08-08 MED ORDER — LACTATED RINGERS IV SOLN: 1000.0000 mL | INTRAVENOUS | Status: DC

## 2015-08-08 MED ORDER — CEFAZOLIN IN D5W 1 GM/50ML IV SOLN: 1.0000 g | Freq: Once | INTRAVENOUS | Status: DC

## 2015-08-08 MED ORDER — FENTANYL CITRATE (PF) 100 MCG/2ML IJ SOLN
100.0000 ug | Freq: Once | INTRAMUSCULAR | Status: AC
  Administered 2015-08-08: 100 ug via INTRAVENOUS
  Filled 2015-08-08: qty 2

## 2015-08-08 NOTE — Progress Notes (Signed)
PROCEDURE PERFORMED: Left Lumbar sympathetic block.  HISTORY OF PRESENT ILLNESS: The patient is 40 y.o. female who returns to Pain Management Center for further evaluation and treatment of pain involving the lower extremities. The patient is with history of sprain of the left ankle with pain described as burning throbbing aching sensation with tenderness to touch of the ankle and surrounding region . There is concern regarding the patient's pain being due to complex regional pain syndrome of the left lower extremity . The risks, benefits, and expectations of the procedure were discussed and explained to the patient who was understanding and wished to proceed with interventional treatment as planned.   DESCRIPTION OF PROCEDURE: Lumbar sympathetic block with IV Versed, IV fentanyl conscious sedation, EKG, blood pressure, pulse, capnography, and pulse oximetry monitoring. The procedure was performed with the patient in prone position under fluoroscopic guidance.   NEEDLE PLACEMENT AT L2, left side lumbar sympathetic block: With the patient in prone position and oblique orientation of 20 degrees, Betadine prep and local anesthetic skin wheal of 1.5% lidocaine plain was prepared at the proposed needle entry site. Under fluoroscopic guidance with 20 degrees oblique orientation, the 22 -gauge needle was inserted at the lateral border of the L2 vertebral body on the left side.   NEEDLE PLACEMENT AT L3, left side lumbar sympathetic block: With the patient in the prone position and oblique orientation of 20 degrees, Betadine prep and local anesthetic skin wheal of 1.5% lidocaine plain was prepared at the proposed needle entry site. Under fluoroscopic guidance with 20 degrees  oblique orientation, the 22 -gauge needle was inserted at the lateral border of the L3 vertebral body on the left side.   NEEDLE PLACEMENT AT L4, left side lumbar sympathetic block: With the patient in prone position and oblique orientation  of 20 degrees, Betadine prep and local anesthetic skin wheal of 1.5% lidocaine plain was prepared at the proposed needle entry site. Under fluoroscopic guidance with 20 degrees  oblique orientation, the 22 -gauge needle was inserted at the lateral border of the L4 vertebral body on the left side.    Following needle placement at the L2, L3 and L4 vertebral body levels on the left side, needle placement was then verified on lateral view with tip of the needle documented to be in the anterior third of the vertebral body of L2, L3 and L4 respectively. Following negative aspiration of each needle for heme and CSF, L2 vertebral body level needle was injected with 10 mL of 0.25% bupivacaine. L3 vertebral body level needle was injected with 10 mL of 0.25% bupivacaine. L4 vertebral body level was injected with 10 mL of 0.25% bupivacaine. Needles were removed. Please note temperature readings prior to lumbar sympathetic block were noted to be 88.8 degrees Fahrenheit and following completion of the lumbar sympathetic block temperature readings of the lower extremity were noted to be 94.4 degrees Fahrenheit.   The patient tolerated the procedure well.  PLAN:   1. Medications: We will continue presently prescribed medications tramadol and oxycodone at this time. 2. The patient is to follow-up with primary care physician Dr. Terance Hart for further evaluation of blood pressure and general medical condition as discussed. 3. Surgical evaluation as discussed. 4. Neurological evaluation as discussed. 5. The patient may be candidate for radiofrequency procedures, implantation devices, and other treatment pending response to treatment and follow-up evaluation. 6. The patient has been advised to adhere to proper body mechanics. 7. The patient has been advised to call the Pain  Management Center prior to scheduled return appointment should there be significant change in condition or have other concerns regarding condition  prior to scheduled return appointment.   The patient was understanding and in agreement with suggested treatment plan.

## 2015-08-08 NOTE — Patient Instructions (Addendum)
PLAN  Continue present medications tramadol and oxycodone  Please obtain Ceftin antibiotic today and begin taking Ceftin antibiotic today as prescribed  CAUTION Medication can cause respiratory depression and cause you to stop breathing, cause excessive sedation, cause confusion and other side effects.  Exercise extreme caution when taking medication and call EMS or go to the Emergency Department immediately if you develop any of these symptoms    F/U PCP Dr. Yates Decamp III  for evaluation of  BP and general medical condition  F/U surgical evaluation. Patient will follow-up with Dr. Joice Lofts  or other  surgeon as needed  F/U neurological evaluation. May consider PNCV/EMG studies and other studies pending follow-up evaluations  F/U psych evaluation with Dr. Marguerite Olea as planned  F/U pulmonary evaluation with Dr. Meredeth Ide  May consider radiofrequency rhizolysis or intraspinal procedures pending response to present treatment and F/U evaluation   Patient to call Pain Management Center should patient have concerns prior to scheduled return appointment.   Lumbar Sympathetic Block Patient Information  Description: The lumbar plexus is a group of nerves that are part of the sympathetic nervous system.  These nerves supply organs in the pelvis and legs.  Lumbar sympathetic blocks are utilized for the diagnosis and treatment of painful conditions in these areas.   The lumbar plexus is located on both sides of the aorta at approximately the level of the second lumbar vertebral body.  The block will be performed with you lying on your abdomen with a pillow underneath.  Using direct x-ray guidance,   The plexus will be located on both sides of the spine.  Numbing medicine will be used to deaden the skin prior to needle insertion.  In most cases, a small amount of sedation can be give by IV prior to the numbing medicine.  One or two small needles will be placed near the plexus and local anesthetic will be  injected.  This may make your leg(s) feel warm.  The Entire block usually lasts about 15-25 minutes.  Conditions which may be treated by lumbar sympathetic block:   Reflex sympathetic dystrophy  Phantom limb pain  Peripheral neuropathy  Peripheral vascular disease ( inadequate blood flow )  Cancer pain of pelvis, leg and kidney  Preparation for the injection:  1. Do note eat any solid food or diary products within 8 hours of your appointment. 2. You may drink clear liquids up to 3 hours before appointment.  Clear liquids include water, black coffee, juice or soda.  No milk or cream please. 3. You may take your regular medication, including pain medications, with a sip of water before you appointment.  Diabetics should hold regular insulin ( if taken separately ) and take 1/2 NPH dose the morning of the procedure .  Carry some sugar containing items with you to your appointment. 4. A driver must accompany you and be prepared to drive you home after your procedure. 5. Bring all your current medication with you. 6. An IV may be inserted and sedation may be given at the discretion of the physician.  7. A blood pressure cuff, EKG and other monitors will often be applied during the procedure.  Some patients may need to have extra oxygen administered for a short period. 8. You will be asked to provide medical information, including your allergies and medications, prior to the procedure.  We must know immediately if your taking blood thinners (like Coumadin/Warfarin) or if you are allergic to IV iodine contrast (dye).  We must  know if you could possibly be pregnant.  Possible side-effects   Bleeding from needle site or deeper  Infection (rare, can require surgery)  Nerve injury (rare)  Numbness & tingling (temporary)  Collapsed lung (rare)  Spinal headache (a headache worse with upright posture)  Light-headedness (temporary)  Pain at injection site (several days)  Decreased blood  pressure (temporary)  Weakness in legs (temporary)  Seizure or other drug reaction (rare)  Call if you experience:   Fever/chills associated with headache or increased back/ neck pain  Headache worsened by an upright position  New onset weakness or numbness of an extremity below the injection site  Hives or difficulty breathing ( go to the emergency room)  Inflammation or drainage at the injections site(s)  New symptoms which are concerning to you  Please note:  If effective, we will often do a series of 2-3 injections spaced 3-6 weeks apart to maximally decrease your pain.  If initial series is effective, you may be a candidate for a more permanent block of the lumbar sympathetic plexus.  If you have any questions please call [phone] [facility]  Pain Management Discharge Instructions  General Discharge Instructions :  If you need to reach your doctor call: Monday-Friday 8:00 am - 4:00 pm at [phone] or toll free [phone].  After clinic hours [phone] to have operator reach doctor.  Bring all of your medication bottles to all your appointments in the pain clinic.  To cancel or reschedule your appointment with Pain Management please remember to call 24 hours in advance to avoid a fee.  Refer to the educational materials which you have been given on: General Risks, I had my Procedure. Discharge Instructions, Post Sedation.  Post Procedure Instructions:  The drugs you were given will stay in your system until tomorrow, so for the next 24 hours you should not drive, make any legal decisions or drink any alcoholic beverages.  You may eat anything you prefer, but it is better to start with liquids then soups and crackers, and gradually work up to solid foods.  Please notify your doctor immediately if you have any unusual bleeding, trouble breathing or pain that is not related to your normal pain.  Depending on the type of  procedure that was done, some parts of your body may feel week and/or numb.  This usually clears up by tonight or the next day.  Walk with the use of an assistive device or accompanied by an adult for the 24 hours.  You may use ice on the affected area for the first 24 hours.  Put ice in a Ziploc bag and cover with a towel and place against area 15 minutes on 15 minutes off.  You may switch to heat after 24 hours.  A prescription for CEFTIN was sent to your pharmacy and should be available for pickup today.

## 2015-08-09 ENCOUNTER — Telehealth: Payer: Self-pay

## 2015-08-09 NOTE — Telephone Encounter (Signed)
Patient states that her ankle hurts and that she had a flare up on her back yesterday. Encouraged ice and to start heat today. Instructed to call if needed

## 2015-08-13 LAB — TOXASSURE SELECT 13 (MW), URINE: PDF: 0

## 2015-08-13 NOTE — Progress Notes (Signed)
Reviewed

## 2015-08-14 ENCOUNTER — Ambulatory Visit: Payer: Medicaid Other | Admitting: Pain Medicine

## 2015-08-16 ENCOUNTER — Telehealth: Payer: Self-pay

## 2015-08-16 NOTE — Telephone Encounter (Signed)
Kirsten Richardson and Nurses Please schedule patient for an earlier evaluation appointment.  [name] Please clarify patient's message for me. It is unclear as to what patient is requesting that I prescribed for her.  Thank you

## 2015-08-16 NOTE — Telephone Encounter (Signed)
Returned call- Patient states she has not had any relief from last Procedure on 08/08/15 (LSB), would like to know if Dr Metta Clines will repeat it or do another procedure. She states that Dr Starling Manns had  to repeat procedures and that it has worked in the past. States back and hips are worse, but some relief in ankle.   Patient would also like to have an antiimm. If Possible. Uses [facility] on [address]. Her return visit for Eval is on August 29, 2015. Please advise on what we should do.

## 2015-08-16 NOTE — Telephone Encounter (Signed)
Having back pain that runs down to his hip

## 2015-08-20 ENCOUNTER — Ambulatory Visit
Admission: RE | Admit: 2015-08-20 | Discharge: 2015-08-20 | Disposition: A | Discharge status: Home / Self Care | Payer: Medicare Other | Source: Ambulatory Visit | Attending: Pain Medicine | Admitting: Pain Medicine

## 2015-08-20 ENCOUNTER — Other Ambulatory Visit: Payer: Self-pay | Admitting: Pain Medicine

## 2015-08-20 DIAGNOSIS — G90522 Complex regional pain syndrome I of left lower limb: Secondary | ICD-10-CM

## 2015-08-20 DIAGNOSIS — M5030 Other cervical disc degeneration, unspecified cervical region: Secondary | ICD-10-CM | POA: Diagnosis not present

## 2015-08-20 DIAGNOSIS — M47812 Spondylosis without myelopathy or radiculopathy, cervical region: Secondary | ICD-10-CM

## 2015-08-20 DIAGNOSIS — M533 Sacrococcygeal disorders, not elsewhere classified: Secondary | ICD-10-CM | POA: Insufficient documentation

## 2015-08-20 DIAGNOSIS — M47816 Spondylosis without myelopathy or radiculopathy, lumbar region: Secondary | ICD-10-CM

## 2015-08-20 DIAGNOSIS — M5382 Other specified dorsopathies, cervical region: Secondary | ICD-10-CM | POA: Diagnosis not present

## 2015-08-20 DIAGNOSIS — Z9889 Other specified postprocedural states: Secondary | ICD-10-CM | POA: Diagnosis not present

## 2015-08-20 DIAGNOSIS — M5136 Other intervertebral disc degeneration, lumbar region: Secondary | ICD-10-CM | POA: Insufficient documentation

## 2015-08-20 DIAGNOSIS — M4120 Other idiopathic scoliosis, site unspecified: Secondary | ICD-10-CM | POA: Diagnosis not present

## 2015-08-20 DIAGNOSIS — M5010 Cervical disc disorder with radiculopathy, unspecified cervical region: Secondary | ICD-10-CM

## 2015-08-20 DIAGNOSIS — M5020 Other cervical disc displacement, unspecified cervical region: Secondary | ICD-10-CM | POA: Insufficient documentation

## 2015-08-20 DIAGNOSIS — M545 Low back pain: Secondary | ICD-10-CM | POA: Diagnosis present

## 2015-08-21 NOTE — Telephone Encounter (Signed)
Patient called 08-20-15 to see if she could come in for 2nd procedure, please let

## 2015-08-21 NOTE — Telephone Encounter (Signed)
Dena and Nurses, Please call patient and describe patient's pain and symptoms in further detail for me so that the appropriate procedure can be considered. Once we decide upon the appropriate procedure, we can schedule appointment for procedure provided Angie is able to obtain approval. Please follow-up with me in this regard. Thank you

## 2015-08-22 ENCOUNTER — Other Ambulatory Visit: Payer: Self-pay | Admitting: Pain Medicine

## 2015-08-22 DIAGNOSIS — M5136 Other intervertebral disc degeneration, lumbar region: Secondary | ICD-10-CM

## 2015-08-22 DIAGNOSIS — M4120 Other idiopathic scoliosis, site unspecified: Secondary | ICD-10-CM

## 2015-08-22 DIAGNOSIS — M5030 Other cervical disc degeneration, unspecified cervical region: Secondary | ICD-10-CM

## 2015-08-22 DIAGNOSIS — M5415 Radiculopathy, thoracolumbar region: Secondary | ICD-10-CM

## 2015-08-22 NOTE — Telephone Encounter (Signed)
Pain in both sides of the lower back.

## 2015-08-22 NOTE — Telephone Encounter (Signed)
No prior auth reqd - okay to schedule patient  °

## 2015-08-22 NOTE — Telephone Encounter (Signed)
Kirsten Richardson , and Kirsten Richardson, Please schedule patient for Lumbosacral Selective Nerve Root Block (SNRB) for                                              Wednesday, 08/29/2015  if insurance will allow   Thank you

## 2015-08-29 ENCOUNTER — Encounter: Payer: Self-pay | Admitting: Pain Medicine

## 2015-08-29 ENCOUNTER — Ambulatory Visit: Payer: Medicare Other | Attending: Pain Medicine | Admitting: Pain Medicine

## 2015-08-29 VITALS — BP 146/89 | HR 85 | Temp 97.5°F | Resp 18 | Ht 60.0 in | Wt 170.0 lb

## 2015-08-29 DIAGNOSIS — M25559 Pain in unspecified hip: Secondary | ICD-10-CM

## 2015-08-29 DIAGNOSIS — G90522 Complex regional pain syndrome I of left lower limb: Secondary | ICD-10-CM

## 2015-08-29 DIAGNOSIS — M5136 Other intervertebral disc degeneration, lumbar region: Secondary | ICD-10-CM

## 2015-08-29 DIAGNOSIS — F310 Bipolar disorder, current episode hypomanic: Secondary | ICD-10-CM

## 2015-08-29 DIAGNOSIS — F603 Borderline personality disorder: Secondary | ICD-10-CM

## 2015-08-29 DIAGNOSIS — M4120 Other idiopathic scoliosis, site unspecified: Secondary | ICD-10-CM

## 2015-08-29 DIAGNOSIS — M79606 Pain in leg, unspecified: Secondary | ICD-10-CM | POA: Insufficient documentation

## 2015-08-29 DIAGNOSIS — M5030 Other cervical disc degeneration, unspecified cervical region: Secondary | ICD-10-CM

## 2015-08-29 DIAGNOSIS — M545 Low back pain: Secondary | ICD-10-CM | POA: Insufficient documentation

## 2015-08-29 DIAGNOSIS — M533 Sacrococcygeal disorders, not elsewhere classified: Secondary | ICD-10-CM

## 2015-08-29 DIAGNOSIS — G8929 Other chronic pain: Secondary | ICD-10-CM

## 2015-08-29 DIAGNOSIS — M47816 Spondylosis without myelopathy or radiculopathy, lumbar region: Secondary | ICD-10-CM

## 2015-08-29 DIAGNOSIS — M5126 Other intervertebral disc displacement, lumbar region: Secondary | ICD-10-CM | POA: Diagnosis not present

## 2015-08-29 DIAGNOSIS — M25579 Pain in unspecified ankle and joints of unspecified foot: Secondary | ICD-10-CM

## 2015-08-29 DIAGNOSIS — M5010 Cervical disc disorder with radiculopathy, unspecified cervical region: Secondary | ICD-10-CM

## 2015-08-29 DIAGNOSIS — M47812 Spondylosis without myelopathy or radiculopathy, cervical region: Secondary | ICD-10-CM

## 2015-08-29 DIAGNOSIS — M5416 Radiculopathy, lumbar region: Secondary | ICD-10-CM

## 2015-08-29 DIAGNOSIS — M5481 Occipital neuralgia: Secondary | ICD-10-CM

## 2015-08-29 DIAGNOSIS — Q675 Congenital deformity of spine: Secondary | ICD-10-CM

## 2015-08-29 DIAGNOSIS — M1612 Unilateral primary osteoarthritis, left hip: Secondary | ICD-10-CM

## 2015-08-29 MED ORDER — BUPIVACAINE HCL (PF) 0.25 % IJ SOLN
30.0000 mL | Freq: Once | INTRAMUSCULAR | Status: AC
  Administered 2015-08-29: 30 mL
  Filled 2015-08-29: qty 30

## 2015-08-29 MED ORDER — CEFAZOLIN SODIUM 1 G IJ SOLR
INTRAMUSCULAR | Status: AC
  Administered 2015-08-29: 1 g via INTRAVENOUS
  Filled 2015-08-29: qty 10

## 2015-08-29 MED ORDER — LIDOCAINE HCL (PF) 1 % IJ SOLN
10.0000 mL | Freq: Once | INTRAMUSCULAR | Status: AC
  Administered 2015-08-29: 10 mL via SUBCUTANEOUS

## 2015-08-29 MED ORDER — TRIAMCINOLONE ACETONIDE 40 MG/ML IJ SUSP
40.0000 mg | Freq: Once | INTRAMUSCULAR | Status: AC
  Administered 2015-08-29: 40 mg
  Filled 2015-08-29: qty 1

## 2015-08-29 MED ORDER — CEFAZOLIN IN D5W 1 GM/50ML IV SOLN: 1.0000 g | Freq: Once | INTRAVENOUS | Status: DC

## 2015-08-29 MED ORDER — CEFUROXIME AXETIL 250 MG PO TABS: 250.0000 mg | ORAL_TABLET | Freq: Two times a day (BID) | ORAL | 0 refills | Status: DC

## 2015-08-29 MED ORDER — MIDAZOLAM HCL 5 MG/5ML IJ SOLN
5.0000 mg | Freq: Once | INTRAMUSCULAR | Status: AC
  Administered 2015-08-29: 2 mg via INTRAVENOUS
  Filled 2015-08-29: qty 5

## 2015-08-29 MED ORDER — LACTATED RINGERS IV SOLN
1000.0000 mL | INTRAVENOUS | Status: DC
  Administered 2015-08-29: 1000 mL via INTRAVENOUS

## 2015-08-29 MED ORDER — ORPHENADRINE CITRATE 30 MG/ML IJ SOLN
60.0000 mg | Freq: Once | INTRAMUSCULAR | Status: AC
Start: 2015-08-29 — End: 2015-08-29
  Administered 2015-08-29: 60 mg via INTRAMUSCULAR
  Filled 2015-08-29: qty 2

## 2015-08-29 MED ORDER — FENTANYL CITRATE (PF) 100 MCG/2ML IJ SOLN
100.0000 ug | Freq: Once | INTRAMUSCULAR | Status: AC
  Administered 2015-08-29: 50 ug via INTRAVENOUS
  Filled 2015-08-29: qty 2

## 2015-08-29 NOTE — Progress Notes (Signed)
PROCEDURE PERFORMED: Lumbosacral selective nerve root block   NOTE: The patient is a 40 y.o. female who [facility] for further evaluation and treatment of pain involving the lumbar and lower extremity region. Studies consisting of MRI has revealed the patient to be with evidence of small disc protrusion L5-S1. This is central to right paracentral. This  along with facetal hypertrophy results in mild narrowing of the right  neural  foramen and mild swelling of the right S1 nerve root.  *. There is concern regarding intraspinal abnormalities contributing to the patient's symptomatology with concern regarding component of patient's pain being due to lumbar radiculopathy. There is also concern regarding component of patient's pain being due to complex regional pain syndrome with history of trauma of the left lower extremity.. The risks, benefits, and expectations of the procedure have been explained to the patient who was understanding and in agreement with suggested treatment plan. We will proceed with interventional treatment as discussed and as explained to the patient. The patient is understanding and in agreement with suggested treatment plan.   DESCRIPTION OF PROCEDURE: Lumbosacral selective nerve root block with IV Versed, IV fentanyl conscious sedation, EKG, blood pressure, pulse, capnography, and pulse oximetry monitoring. The procedure was performed with the patient in the prone position under fluoroscopic guidance. With the patient in the prone position, Betadine prep of proposed entry site was performed. Local anesthetic skin wheal of proposed needle entry site was prepared with 1.5% plain lidocaine with AP view of the lumbosacral spine.   PROCEDURE #1: Needle placement at the left L 2 vertebral body: A 22 -gauge needle was inserted at the inferior border of the transverse process of the vertebral body with needle placed medial to the midline of the transverse  process on AP view of the lumbosacral spine.   NEEDLE PLACEMENT AT  L3, L4, and L5  VERTEBRAL BODY LEVELS  Needle  placement was accomplished at L3, L4, and L5  vertebral body levels on the left side exactly as was accomplished at the L2  vertebral body level  and utilizing the same technique and under fluoroscopic guidance.   Needle placement was then verified on lateral view at all levels with needle tip documented to be in the posterior superior quadrant of the intervertebral foramen of  L 2 L3, L4, and L5. Following negative aspiration for heme and CSF at each level, each level was injected with 3 mL of 0.25% bupivacaine with Kenalog   Myoneural block injections of the lumbar paraspinal musculature region Following Betadine prep of proposed entry site a 22-gauge needle was inserted into the lumbar paraspinal musculature region and following negative aspiration 2 cc of 0.25% bupivacaine with Norflex was injected for myoneural block injection of the lumbar paraspinal musculature region 4.   The patient tolerated the procedure well. A total of 10 mg of Kenalog was utilized for the procedure.   PLAN:  1. Medications: Will continue presently prescribed medications tramadol and oxycodone. 2. The patient is to undergo follow-up evaluation with PCP Dr. Yates Decamp III for evaluation of blood pressure and general medical condition status post procedure performed on today's visit. 3. Surgical follow-up evaluation. Follow-up with Dr. Joice Lofts as discussed. We will consider additional surgical evaluation pending follow-up evaluations 4. Neurological evaluation. May consider PNCV/EMG studies and other studies 5. May consider radiofrequency procedures, implantation type procedures and other treatment pending response to treatment and follow-up evaluation. 6. The patient has been advised do adhere to  proper body mechanics and avoid activities which may aggravate condition. 7. The patient has been advised  to call the [facility] prior to scheduled return appointment should there be significant change in the patient's condition or should the patient have other concerns regarding condition prior to scheduled return appointment.

## 2015-08-29 NOTE — Patient Instructions (Addendum)
PLAN  Continue present medications tramadol and oxycodone  Please obtain Ceftin antibiotic today and begin taking Ceftin antibiotic today as prescribed  CAUTION Medication can cause respiratory depression and cause you to stop breathing, cause excessive sedation, cause confusion and other side effects.  Exercise extreme caution when taking medication and call EMS or go to the Emergency Department immediately if you develop any of these symptoms    F/U PCP Dr. Yates Decamp III  for evaluation of  BP and general medical condition  F/U surgical evaluation. Patient will follow-up with Dr. Joice Lofts  or other  surgeon as needed  F/U neurological evaluation. May consider PNCV/EMG studies and other studies pending follow-up evaluations  F/U psych evaluation with Dr. Marguerite Olea as planned  F/U pulmonary evaluation with Dr. Meredeth Ide  May consider radiofrequency rhizolysis or intraspinal procedures pending response to present treatment and F/U evaluation   Patient to call [facility] should patient have concerns prior to scheduled return appointment.   Selective Nerve Root Block Patient Information  Description: Specific nerve roots exit the spinal canal and these nerves can be compressed and inflamed by a bulging disc and bone spurs.  By injecting steroids on the nerve root, we can potentially decrease the inflammation surrounding these nerves, which often leads to decreased pain.  Also, by injecting local anesthesia on the nerve root, this can provide [place] helpful information to give to your referring doctor if it decreases your pain.  Selective nerve root blocks can be done along the spine from the neck to the low back depending on the location of your pain.   After numbing the skin with local anesthesia, a small needle is passed to the nerve root and the position of the needle is verified using x-ray pictures.  After the needle is in correct position, we then deposit the medication.  You may  experience a pressure sensation while this is being done.  The entire block usually lasts less than 15 minutes.  Conditions that may be treated with selective nerve root blocks:  Low back and leg pain  Spinal stenosis  Diagnostic block prior to potential surgery  Neck and arm pain  Post laminectomy syndrome  Preparation for the injection:  1. Do not eat any solid food or dairy products within 8 hours of your appointment. 2. You may drink clear liquids up to 3 hours before an appointment.  Clear liquids include water, black coffee, juice or soda.  No milk or cream please. 3. You may take your regular medications, including pain medications, with a sip of water before your appointment.  Diabetics should hold regular insulin (if taken separately) and take 1/2 normal NPH dose the morning of the procedure.  Carry some sugar containing items with you to your appointment. 4. A driver must accompany you and be prepared to drive you home after your procedure. 5. Bring all your current medications with you. 6. An IV may be inserted and sedation may be given at the discretion of the physician. 7. A blood pressure cuff, EKG, and other monitors will often be applied during the procedure.  Some patients may need to have extra oxygen administered for a short period. 8. You will be asked to provide medical information, including allergies, prior to the procedure.  We must know immediately if you are taking blood  Thinners (like Coumadin) or if you are allergic to IV iodine contrast (dye).  Possible side-effects: All are usually temporary  Bleeding from needle site  Light headedness  Numbness and tingling  Decreased blood pressure  Weakness in arms/legs  Pressure sensation in back/neck  Pain at injection site (several days)  Possible complications: All are extremely rare  Infection  Nerve injury  Spinal headache (a headache wore with upright position)  Call if you  experience:  Fever/chills associated with headache or increased back/neck pain  Headache worsened by an upright position  New onset weakness or numbness of an extremity below the injection site  Hives or difficulty breathing (go to the emergency room)  Inflammation or drainage at the injection site(s)  Severe back/neck pain greater than usual  New symptoms which are concerning to you  Please note:  Although the local anesthetic injected can often make your back or neck feel good for several hours after the injection the pain will likely return.  It takes 3-5 days for steroids to work on the nerve root. You may not notice any pain relief for at least one week.  If effective, we will often do a series of 3 injections spaced 3-6 weeks apart to maximally decrease your pain.    If you have any questions, please call [phone] [facility]  Pain Management Discharge Instructions  General Discharge Instructions :  If you need to reach your doctor call: Monday-Friday 8:00 am - 4:00 pm at [phone] or toll free [phone].  After clinic hours [phone] to have operator reach doctor.  Bring all of your medication bottles to all your appointments in the pain clinic.  To cancel or reschedule your appointment with Pain Management please remember to call 24 hours in advance to avoid a fee.  Refer to the educational materials which you have been given on: General Risks, I had my Procedure. Discharge Instructions, Post Sedation.  Post Procedure Instructions:  The drugs you were given will stay in your system until tomorrow, so for the next 24 hours you should not drive, make any legal decisions or drink any alcoholic beverages.  You may eat anything you prefer, but it is better to start with liquids then soups and crackers, and gradually work up to solid foods.  Please notify your doctor immediately if you have any unusual bleeding, trouble  breathing or pain that is not related to your normal pain.  Depending on the type of procedure that was done, some parts of your body may feel week and/or numb.  This usually clears up by tonight or the next day.  Walk with the use of an assistive device or accompanied by an adult for the 24 hours.  You may use ice on the affected area for the first 24 hours.  Put ice in a Ziploc bag and cover with a towel and place against area 15 minutes on 15 minutes off.  You may switch to heat after 24 hours.

## 2015-08-30 ENCOUNTER — Ambulatory Visit: Payer: Medicare Other | Attending: Pain Medicine | Admitting: Pain Medicine

## 2015-08-30 ENCOUNTER — Encounter: Payer: Self-pay | Admitting: Pain Medicine

## 2015-08-30 VITALS — BP 157/85 | HR 84 | Temp 98.5°F | Resp 16 | Ht 60.0 in | Wt 170.0 lb

## 2015-08-30 DIAGNOSIS — Z9889 Other specified postprocedural states: Secondary | ICD-10-CM | POA: Diagnosis not present

## 2015-08-30 DIAGNOSIS — M5030 Other cervical disc degeneration, unspecified cervical region: Secondary | ICD-10-CM

## 2015-08-30 DIAGNOSIS — M160 Bilateral primary osteoarthritis of hip: Secondary | ICD-10-CM | POA: Diagnosis not present

## 2015-08-30 DIAGNOSIS — M533 Sacrococcygeal disorders, not elsewhere classified: Secondary | ICD-10-CM

## 2015-08-30 DIAGNOSIS — M419 Scoliosis, unspecified: Secondary | ICD-10-CM | POA: Diagnosis not present

## 2015-08-30 DIAGNOSIS — M79604 Pain in right leg: Secondary | ICD-10-CM | POA: Diagnosis present

## 2015-08-30 DIAGNOSIS — G90522 Complex regional pain syndrome I of left lower limb: Secondary | ICD-10-CM

## 2015-08-30 DIAGNOSIS — M722 Plantar fascial fibromatosis: Secondary | ICD-10-CM | POA: Diagnosis not present

## 2015-08-30 DIAGNOSIS — R200 Anesthesia of skin: Secondary | ICD-10-CM | POA: Insufficient documentation

## 2015-08-30 DIAGNOSIS — K589 Irritable bowel syndrome without diarrhea: Secondary | ICD-10-CM | POA: Diagnosis not present

## 2015-08-30 DIAGNOSIS — Z8781 Personal history of (healed) traumatic fracture: Secondary | ICD-10-CM | POA: Insufficient documentation

## 2015-08-30 DIAGNOSIS — G8929 Other chronic pain: Secondary | ICD-10-CM

## 2015-08-30 DIAGNOSIS — M25559 Pain in unspecified hip: Secondary | ICD-10-CM

## 2015-08-30 DIAGNOSIS — M5010 Cervical disc disorder with radiculopathy, unspecified cervical region: Secondary | ICD-10-CM

## 2015-08-30 DIAGNOSIS — M5481 Occipital neuralgia: Secondary | ICD-10-CM

## 2015-08-30 DIAGNOSIS — M25579 Pain in unspecified ankle and joints of unspecified foot: Secondary | ICD-10-CM

## 2015-08-30 DIAGNOSIS — M4120 Other idiopathic scoliosis, site unspecified: Secondary | ICD-10-CM

## 2015-08-30 DIAGNOSIS — M47812 Spondylosis without myelopathy or radiculopathy, cervical region: Secondary | ICD-10-CM

## 2015-08-30 DIAGNOSIS — F603 Borderline personality disorder: Secondary | ICD-10-CM

## 2015-08-30 DIAGNOSIS — M79605 Pain in left leg: Secondary | ICD-10-CM | POA: Diagnosis present

## 2015-08-30 DIAGNOSIS — M47816 Spondylosis without myelopathy or radiculopathy, lumbar region: Secondary | ICD-10-CM

## 2015-08-30 DIAGNOSIS — G90523 Complex regional pain syndrome I of lower limb, bilateral: Secondary | ICD-10-CM

## 2015-08-30 DIAGNOSIS — F310 Bipolar disorder, current episode hypomanic: Secondary | ICD-10-CM

## 2015-08-30 DIAGNOSIS — M5136 Other intervertebral disc degeneration, lumbar region: Secondary | ICD-10-CM | POA: Diagnosis not present

## 2015-08-30 DIAGNOSIS — M1612 Unilateral primary osteoarthritis, left hip: Secondary | ICD-10-CM

## 2015-08-30 MED ORDER — TRAMADOL HCL 50 MG PO TABS: ORAL_TABLET | ORAL | 0 refills | Status: DC

## 2015-08-30 MED ORDER — OXYCODONE-ACETAMINOPHEN 5-325 MG PO TABS: ORAL_TABLET | ORAL | 0 refills | Status: DC

## 2015-08-30 NOTE — Patient Instructions (Addendum)
PLAN  Continue present medications tramadol and oxycodone CAUTION Medication can cause respiratory depression and cause you to stop breathing, cause excessive sedation, cause confusion and other side effects.  Exercise extreme caution when taking medication and call EMS or go to the Emergency Department immediately if you develop any of these symptoms   Lumbosacral selective nerve root block to be performed at time return appointment  F/U PCP Dr. Yates Decamp III  for evaluation of  BP and general medical condition  Ask the nurses and secretary the day of your MRI of the cervical region  F/U surgical evaluation. Patient will follow-up with Dr. Joice Lofts  Or other  surgeon as needed  F/U neurological evaluation. May consider PNCV/EMG studies and other studies pending follow-up evaluations  F/U psych evaluation with Dr. Marguerite Olea as planned  F/U pulmonary evaluation with Dr. Meredeth Ide  May consider radiofrequency rhizolysis or intraspinal procedures pending response to present treatment and F/U evaluation   Patient to call [facility] should patient have concerns prior to scheduled return appointment. Selective Nerve Root Block Patient Information  Description: Specific nerve roots exit the spinal canal and these nerves can be compressed and inflamed by a bulging disc and bone spurs.  By injecting steroids on the nerve root, we can potentially decrease the inflammation surrounding these nerves, which often leads to decreased pain.  Also, by injecting local anesthesia on the nerve root, this can provide [place] helpful information to give to your referring doctor if it decreases your pain.  Selective nerve root blocks can be done along the spine from the neck to the low back depending on the location of your pain.   After numbing the skin with local anesthesia, a small needle is passed to the nerve root and the position of the needle is verified using x-ray pictures.  After the needle is in  correct position, we then deposit the medication.  You may experience a pressure sensation while this is being done.  The entire block usually lasts less than 15 minutes.  Conditions that may be treated with selective nerve root blocks:  Low back and leg pain  Spinal stenosis  Diagnostic block prior to potential surgery  Neck and arm pain  Post laminectomy syndrome  Preparation for the injection:  1. Do not eat any solid food or dairy products within 8 hours of your appointment. 2. You may drink clear liquids up to 3 hours before an appointment.  Clear liquids include water, black coffee, juice or soda.  No milk or cream please. 3. You may take your regular medications, including pain medications, with a sip of water before your appointment.  Diabetics should hold regular insulin (if taken separately) and take 1/2 normal NPH dose the morning of the procedure.  Carry some sugar containing items with you to your appointment. 4. A driver must accompany you and be prepared to drive you home after your procedure. 5. Bring all your current medications with you. 6. An IV may be inserted and sedation may be given at the discretion of the physician. 7. A blood pressure cuff, EKG, and other monitors will often be applied during the procedure.  Some patients may need to have extra oxygen administered for a short period. 8. You will be asked to provide medical information, including allergies, prior to the procedure.  We must know immediately if you are taking blood  Thinners (like Coumadin) or if you are allergic to IV iodine contrast (dye).  Possible side-effects: All are  usually temporary  Bleeding from needle site  Light headedness  Numbness and tingling  Decreased blood pressure  Weakness in arms/legs  Pressure sensation in back/neck  Pain at injection site (several days)  Possible complications: All are extremely rare  Infection  Nerve injury  Spinal headache (a headache  wore with upright position)  Call if you experience:  Fever/chills associated with headache or increased back/neck pain  Headache worsened by an upright position  New onset weakness or numbness of an extremity below the injection site  Hives or difficulty breathing (go to the emergency room)  Inflammation or drainage at the injection site(s)  Severe back/neck pain greater than usual  New symptoms which are concerning to you  Please note:  Although the local anesthetic injected can often make your back or neck feel good for several hours after the injection the pain will likely return.  It takes 3-5 days for steroids to work on the nerve root. You may not notice any pain relief for at least one week.  If effective, we will often do a series of 3 injections spaced 3-6 weeks apart to maximally decrease your pain.    If you have any questions, please call [phone] [facility] Clinic[facility] R[facility]KS AND COMPLICATIONS  What are the risk, side effects and possible complications? Generally speaking, most procedures are safe.  However, with any procedure there are risks, side effects, and the possibility of complications.  The risks and complications are dependent upon the sites that are lesioned, or the type of nerve block to be performed.  The closer the procedure is to the spine, the more serious the risks are.  Great care is taken when placing the radio frequency needles, block needles or lesioning probes, but sometimes complications can occur. 1. Infection: Any time there is an injection through the skin, there is a risk of infection.  This is why sterile conditions are used for these blocks.  There are four possible types of infection. 1. Localized skin infection. 2. Central Nervous System Infection-This can be in the form of Meningitis, which can be deadly. 3. Epidural Infections-This can be in the form of an epidural abscess, which can cause pressure inside  of the spine, causing compression of the spinal cord with subsequent paralysis. This would require an emergency surgery to decompress, and there are no guarantees that the patient would recover from the paralysis. 4. Discitis-This is an infection of the intervertebral discs.  It occurs in about 1% of discography procedures.  It is difficult to treat and it may lead to surgery.        2. Pain: the needles have to go through skin and soft tissues, will cause soreness.       3. Damage to internal structures:  The nerves to be lesioned may be near blood vessels or    other nerves which can be potentially damaged.       4. Bleeding: Bleeding is more common if the patient is taking blood thinners such as  aspirin, Coumadin, Ticiid, Plavix, etc., or if he/she have some genetic predisposition  such as hemophilia. Bleeding into the spinal canal can cause compression of the spinal  cord with subsequent paralysis.  This would require an emergency surgery to  decompress and there are no guarantees that the patient would recover from the  paralysis.       5. Pneumothorax:  Puncturing of a lung is a possibility, every time a needle is introduced in  the area of the chest or upper back.  Pneumothorax refers to free air around the  collapsed lung(s), inside of the thoracic cavity (chest cavity).  Another two possible  complications related to a similar event would include: Hemothorax and Chylothorax.   These are variations of the Pneumothorax, where instead of air around the collapsed  lung(s), you may have blood or chyle, respectively.       6. Spinal headaches: They may occur with any procedures in the area of the spine.       7. Persistent CSF (Cerebro-Spinal Fluid) leakage: This is a rare problem, but may occur  with prolonged intrathecal or epidural catheters either due to the formation of a fistulous  track or a dural tear.       8. Nerve damage: By working so close to the spinal cord, there is always a possibility  of  nerve damage, which could be as serious as a permanent spinal cord injury with  paralysis.       9. Death:  Although rare, severe deadly allergic reactions known as "Anaphylactic  reaction" can occur to any of the medications used.      10. Worsening of the symptoms:  We can always make thing worse.  What are the chances of something like this happening? Chances of any of this occuring are extremely low.  By statistics, you have more of a chance of getting killed in a motor vehicle accident: while driving to the hospital than any of the above occurring .  Nevertheless, you should be aware that they are possibilities.  In general, it is similar to taking a shower.  Everybody knows that you can slip, hit your head and get killed.  Does that mean that you should not shower again?  Nevertheless always keep in mind that statistics do not mean anything if you happen to be on the wrong side of them.  Even if a procedure has a 1 (one) in a 1,000,000 (million) chance of going wrong, it you happen to be that one..Also, keep in mind that by statistics, you have more of a chance of having something go wrong when taking medications.  Who should not have this procedure? If you are on a blood thinning medication (e.g. Coumadin, Plavix, see list of "Blood Thinners"), or if you have an active infection going on, you should not have the procedure.  If you are taking any blood thinners, please inform your physician.  How should I prepare for this procedure?  Do not eat or drink anything at least six hours prior to the procedure.  Bring a driver with you .  It cannot be a taxi.  Come accompanied by an adult that can drive you back, and that is strong enough to help you if your legs get weak or numb from the local anesthetic.  Take all of your medicines the morning of the procedure with just enough water to swallow them.  If you have diabetes, make sure that you are scheduled to have your procedure done first  thing in the morning, whenever possible.  If you have diabetes, take only half of your insulin dose and notify our nurse that you have done so as soon as you arrive at the clinic.  If you are diabetic, but only take blood sugar pills (oral hypoglycemic), then do not take them on the morning of your procedure.  You may take them after you have had the procedure.  Do not take aspirin or  any aspirin-containing medications, at least eleven (11) days prior to the procedure.  They may prolong bleeding.  Wear loose fitting clothing that may be easy to take off and that you would not mind if it got stained with Betadine or blood.  Do not wear any jewelry or perfume  Remove any nail coloring.  It will interfere with some of our monitoring equipment.  NOTE: Remember that this is not meant to be interpreted as a complete list of all possible complications.  Unforeseen problems may occur.  BLOOD THINNERS The following drugs contain aspirin or other products, which can cause increased bleeding during surgery and should not be taken for 2 weeks prior to and 1 week after surgery.  If you should need take something for relief of minor pain, you may take acetaminophen which is found in Tylenol,m Datril, Anacin-3 and Panadol. It is not blood thinner. The products listed below are.  Do not take any of the products listed below in addition to any listed on your instruction sheet.  A.P.C or A.P.C with Codeine Codeine Phosphate Capsules #3 Ibuprofen Ridaura  ABC compound Congesprin Imuran rimadil  Advil Cope Indocin Robaxisal  Alka-Seltzer Effervescent Pain Reliever and Antacid Coricidin or Coricidin-D  Indomethacin Rufen  Alka-Seltzer plus Cold Medicine Cosprin Ketoprofen S-A-C Tablets  Anacin Analgesic Tablets or Capsules Coumadin Korlgesic Salflex  Anacin Extra Strength Analgesic tablets or capsules CP-2 Tablets Lanoril Salicylate  Anaprox Cuprimine Capsules Levenox Salocol  Anexsia-D Dalteparin Magan  Salsalate  Anodynos Darvon compound Magnesium Salicylate Sine-off  Ansaid Dasin Capsules Magsal Sodium Salicylate  Anturane Depen Capsules Marnal Soma  APF Arthritis pain formula Dewitt's Pills Measurin Stanback  Argesic Dia-Gesic Meclofenamic Sulfinpyrazone  Arthritis Bayer Timed Release Aspirin Diclofenac Meclomen Sulindac  Arthritis pain formula Anacin Dicumarol Medipren Supac  Analgesic (Safety coated) Arthralgen Diffunasal Mefanamic Suprofen  Arthritis Strength Bufferin Dihydrocodeine Mepro Compound Suprol  Arthropan liquid Dopirydamole Methcarbomol with Aspirin Synalgos  ASA tablets/Enseals Disalcid Micrainin Tagament  Ascriptin Doan's Midol Talwin  Ascriptin A/D Dolene Mobidin Tanderil  Ascriptin Extra Strength Dolobid Moblgesic Ticlid  Ascriptin with Codeine Doloprin or Doloprin with Codeine Momentum Tolectin  Asperbuf Duoprin Mono-gesic Trendar  Aspergum Duradyne Motrin or Motrin IB Triminicin  Aspirin plain, buffered or enteric coated Durasal Myochrisine Trigesic  Aspirin Suppositories Easprin Nalfon Trillsate  Aspirin with Codeine Ecotrin Regular or Extra Strength Naprosyn Uracel  Atromid-S Efficin Naproxen Ursinus  Auranofin Capsules Elmiron Neocylate Vanquish  Axotal Emagrin Norgesic Verin  Azathioprine Empirin or Empirin with Codeine Normiflo Vitamin E  Azolid Emprazil Nuprin Voltaren  Bayer Aspirin plain, buffered or children's or timed BC Tablets or powders Encaprin Orgaran Warfarin Sodium  Buff-a-Comp Enoxaparin Orudis Zorpin  Buff-a-Comp with Codeine Equegesic Os-Cal-Gesic   Buffaprin Excedrin plain, buffered or Extra Strength Oxalid   Bufferin Arthritis Strength Feldene Oxphenbutazone   Bufferin plain or Extra Strength Feldene Capsules Oxycodone with Aspirin   Bufferin with Codeine Fenoprofen Fenoprofen Pabalate or Pabalate-SF   Buffets II Flogesic Panagesic   Buffinol plain or Extra Strength Florinal or Florinal with Codeine Panwarfarin   Buf-Tabs Flurbiprofen  Penicillamine   Butalbital Compound Four-way cold tablets Penicillin   Butazolidin Fragmin Pepto-Bismol   Carbenicillin Geminisyn Percodan   Carna Arthritis Reliever Geopen Persantine   Carprofen Gold's salt Persistin   Chloramphenicol Goody's Phenylbutazone   Chloromycetin Haltrain Piroxlcam   Clmetidine heparin Plaquenil   Cllnoril Hyco-pap Ponstel   Clofibrate Hydroxy chloroquine Propoxyphen         Before stopping any of these medications, be  sure to consult the physician who ordered them.  Some, such as Coumadin (Warfarin) are ordered to prevent or treat serious conditions such as "deep thrombosis", "pumonary embolisms", and other heart problems.  The amount of time that you may need off of the medication may also vary with the medication and the reason for which you were taking it.  If you are taking any of these medications, please make sure you notify your pain physician before you undergo any procedures.

## 2015-08-30 NOTE — Progress Notes (Signed)
The patient is a 40 year old female who returns to pain management for further evaluation and treatment of pain involving the lower extremity regions. The patient is with prior trauma of the lower extremities with greater than felt to be component of complex regional pain syndrome contributing to patient's pain and paresthesias of the lower extremity region. The patient has had significant improvement of her pain with prior interventional treatment and modification of medications and other aspects of treatment regimen in pain management. At the present time we will consider performing lumbosacral selective nerve root block at time return appointment in attempt to decrease severity of the symptoms of the lower extremity more significantly and to provide longer lasting relief of patient's symptoms. The patient also will undergo neurosurgical evaluation of pain of the cervical region and weakness of the upper extremities. We reviewed MRI findings of the cervical region on today's visit and patient has been scheduled for neurosurgical evaluation in this regard as well. The patient was with understanding and agreed to suggested treatment plan.    Physical examination  There was tends to palpation of the paraspinal musculature region of the cervical region cervical facet region palpation which reproduces pain of moderate degree. There was decreased grip strength of the upper extremities noted. Tinel and Phalen's maneuver were without increase of pain of significant degree. Palpation over the region of the splenius capitis and occipitalis regions reproduce moderate discomfort as well. There was tenderness over the thoracic region without crepitus of the thoracic region with moderate muscle spasms of the thoracic region noted. There was moderate tenderness of the acromioclavicular and glenohumeral joint regions. The patient was with evidence of decreased grip strength. No definite abnormalities were noted on  Spurling's maneuver. There was tends to palpation over the region of the lumbar region of moderate degree with palpation of the lumbar facets reproducing moderate discomfort. No definite sensory deficit of lower extremities noted. There was increased sensitivity to touch of the lower extremity especially in the region of the left ankle lateral aspect compared to medial aspect. There were no new lesions of the lower extremity noted. EHL strength was decreased. There was negative Homans. Abdomen without excessive tends to palpation and no costovertebral angle tenderness noted.     Assessment     Degenerative disc disease lumbar spine  Lumbar facet syndrome  Complex regional pain syndrome of left lower extremity (status post sprain of left ankle)  Degenerative disc disease cervical spine  Cervical facet syndrome  Status post surgery of hips with prior history of fracture of hips with placement of rods  Osteoarthritis of hips  Scoliosis  Plantar fasciitis  Irritable bowel syndrome     PLAN  Continue present medications tramadol and oxycodone CAUTION Medication can cause respiratory depression and cause you to stop breathing, cause excessive sedation, cause confusion and other side effects.  Exercise extreme caution when taking medication and call EMS or go to the Emergency Department immediately if you develop any of these symptoms   Lumbosacral selective nerve root block to be performed at time return appointment  F/U PCP Dr. Yates Decamp III  for evaluation of  BP and general medical conditio  F/U surgical evaluation. Patient will follow-up with Dr. Joice Lofts  Or other  surgeon as needed. Patient will undergo neurosurgical evaluation of pain of the cervical region and upper extremity region and upper extremity weakness as scheduled   F/U neurological evaluation. May consider PNCV/EMG studies and other studies pending follow-up evaluations  F/U  psych evaluation with Dr. Marguerite Olea  as planned  F/U pulmonary evaluation with Dr. Meredeth Ide  May consider radiofrequency rhizolysis or intraspinal procedures pending response to present treatment and F/U evaluation   Patient to call [facility] should patient have concerns prior to scheduled return appointment.

## 2015-08-30 NOTE — Telephone Encounter (Signed)
Denies any needs today at his time- States she is feeling great!!!!

## 2015-08-30 NOTE — Progress Notes (Signed)
Safety precautions to be maintained throughout the outpatient stay will include: orient to surroundings, keep bed in low position, maintain call bell within reach at all times, provide assistance with transfer out of bed and ambulation.  

## 2015-08-31 DIAGNOSIS — I10 Essential (primary) hypertension: Secondary | ICD-10-CM | POA: Diagnosis present

## 2015-09-05 ENCOUNTER — Ambulatory Visit: Payer: Medicare Other | Attending: Pain Medicine | Admitting: Pain Medicine

## 2015-09-05 ENCOUNTER — Encounter: Payer: Self-pay | Admitting: Pain Medicine

## 2015-09-05 VITALS — BP 190/97 | HR 118 | Temp 97.7°F | Resp 20 | Ht 60.0 in | Wt 170.0 lb

## 2015-09-05 DIAGNOSIS — G90522 Complex regional pain syndrome I of left lower limb: Secondary | ICD-10-CM

## 2015-09-05 DIAGNOSIS — M79606 Pain in leg, unspecified: Secondary | ICD-10-CM | POA: Insufficient documentation

## 2015-09-05 DIAGNOSIS — M5030 Other cervical disc degeneration, unspecified cervical region: Secondary | ICD-10-CM

## 2015-09-05 DIAGNOSIS — M4120 Other idiopathic scoliosis, site unspecified: Secondary | ICD-10-CM

## 2015-09-05 DIAGNOSIS — G8929 Other chronic pain: Secondary | ICD-10-CM

## 2015-09-05 DIAGNOSIS — M47812 Spondylosis without myelopathy or radiculopathy, cervical region: Secondary | ICD-10-CM

## 2015-09-05 DIAGNOSIS — M47816 Spondylosis without myelopathy or radiculopathy, lumbar region: Secondary | ICD-10-CM

## 2015-09-05 DIAGNOSIS — M25572 Pain in left ankle and joints of left foot: Secondary | ICD-10-CM

## 2015-09-05 DIAGNOSIS — G90523 Complex regional pain syndrome I of lower limb, bilateral: Secondary | ICD-10-CM

## 2015-09-05 DIAGNOSIS — M545 Low back pain: Secondary | ICD-10-CM | POA: Diagnosis present

## 2015-09-05 DIAGNOSIS — M25552 Pain in left hip: Secondary | ICD-10-CM

## 2015-09-05 DIAGNOSIS — M5010 Cervical disc disorder with radiculopathy, unspecified cervical region: Secondary | ICD-10-CM

## 2015-09-05 DIAGNOSIS — M1612 Unilateral primary osteoarthritis, left hip: Secondary | ICD-10-CM

## 2015-09-05 DIAGNOSIS — M5136 Other intervertebral disc degeneration, lumbar region: Secondary | ICD-10-CM

## 2015-09-05 DIAGNOSIS — M25579 Pain in unspecified ankle and joints of unspecified foot: Secondary | ICD-10-CM

## 2015-09-05 DIAGNOSIS — M533 Sacrococcygeal disorders, not elsewhere classified: Secondary | ICD-10-CM

## 2015-09-05 DIAGNOSIS — M25559 Pain in unspecified hip: Secondary | ICD-10-CM

## 2015-09-05 MED ORDER — CEFAZOLIN IN D5W 1 GM/50ML IV SOLN
1.0000 g | Freq: Once | INTRAVENOUS | Status: AC
  Administered 2015-09-05: 1 g via INTRAVENOUS

## 2015-09-05 MED ORDER — MIDAZOLAM HCL 5 MG/5ML IJ SOLN
5.0000 mg | Freq: Once | INTRAMUSCULAR | Status: AC
  Administered 2015-09-05: 5 mg via INTRAVENOUS
  Filled 2015-09-05: qty 5

## 2015-09-05 MED ORDER — LIDOCAINE HCL (PF) 1 % IJ SOLN
10.0000 mL | Freq: Once | INTRAMUSCULAR | Status: DC
  Filled 2015-09-05: qty 10

## 2015-09-05 MED ORDER — LACTATED RINGERS IV SOLN: 1000.0000 mL | INTRAVENOUS | Status: DC

## 2015-09-05 MED ORDER — ORPHENADRINE CITRATE 30 MG/ML IJ SOLN
60.0000 mg | Freq: Once | INTRAMUSCULAR | Status: AC
  Administered 2015-09-05: 60 mg via INTRAMUSCULAR
  Filled 2015-09-05: qty 2

## 2015-09-05 MED ORDER — TRIAMCINOLONE ACETONIDE 40 MG/ML IJ SUSP
40.0000 mg | Freq: Once | INTRAMUSCULAR | Status: AC
  Administered 2015-09-05: 40 mg
  Filled 2015-09-05: qty 1

## 2015-09-05 MED ORDER — CEFUROXIME AXETIL 250 MG PO TABS: 250.0000 mg | ORAL_TABLET | Freq: Two times a day (BID) | ORAL | 0 refills | Status: DC

## 2015-09-05 MED ORDER — FENTANYL CITRATE (PF) 100 MCG/2ML IJ SOLN
100.0000 ug | Freq: Once | INTRAMUSCULAR | Status: AC
  Administered 2015-09-05: 100 ug via INTRAVENOUS
  Filled 2015-09-05: qty 2

## 2015-09-05 MED ORDER — BUPIVACAINE HCL (PF) 0.25 % IJ SOLN
30.0000 mL | Freq: Once | INTRAMUSCULAR | Status: AC
  Administered 2015-09-05: 30 mL
  Filled 2015-09-05: qty 30

## 2015-09-05 NOTE — Patient Instructions (Addendum)
PLAN  Continue present medications tramadol and oxycodone  Please obtain Ceftin antibiotic today and begin taking Ceftin antibiotic today as prescribed  CAUTION Medication can cause respiratory depression and cause you to stop breathing, cause excessive sedation, cause confusion and other side effects.  Exercise extreme caution when taking medication and call EMS or go to the Emergency Department immediately if you develop any of these symptoms   F/U PCP Dr. Yates Decamp III  for evaluation of  BP and general medical condition  F/U surgical evaluation. Patient will follow-up with Dr. Joice Lofts  or other  surgeon as needed  F/U neurological evaluation. May consider PNCV/EMG studies and other studies pending follow-up evaluations  F/U psych evaluation with Dr. Marguerite Olea as planned  F/U pulmonary evaluation with Dr. Meredeth Ide  May consider radiofrequency rhizolysis or intraspinal procedures pending response to present treatment and F/U evaluation   Patient to call [facility] should patient have concerns prior to scheduled return appointment. Pain Management Discharge Instructions  General Discharge Instructions :  If you need to reach your doctor call: Monday-Friday 8:00 am - 4:00 pm at [phone] or toll free [phone].  After clinic hours [phone] to have operator reach doctor.  Bring all of your medication bottles to all your appointments in the pain clinic.  To cancel or reschedule your appointment with Pain Management please remember to call 24 hours in advance to avoid a fee.  Refer to the educational materials which you have been given on: General Risks, I had my Procedure. Discharge Instructions, Post Sedation.  Post Procedure Instructions:  The drugs you were given will stay in your system until tomorrow, so for the next 24 hours you should not drive, make any legal decisions or drink any alcoholic beverages.  You may eat anything you prefer, but it is better to  start with liquids then soups and crackers, and gradually work up to solid foods.  Please notify your doctor immediately if you have any unusual bleeding, trouble breathing or pain that is not related to your normal pain.  Depending on the type of procedure that was done, some parts of your body may feel week and/or numb.  This usually clears up by tonight or the next day.  Walk with the use of an assistive device or accompanied by an adult for the 24 hours.  You may use ice on the affected area for the first 24 hours.  Put ice in a Ziploc bag and cover with a towel and place against area 15 minutes on 15 minutes off.  You may switch to heat after 24 hours.GENERAL RISKS AND COMPLICATIONS  What are the risk, side effects and possible complications? Generally speaking, most procedures are safe.  However, with any procedure there are risks, side effects, and the possibility of complications.  The risks and complications are dependent upon the sites that are lesioned, or the type of nerve block to be performed.  The closer the procedure is to the spine, the more serious the risks are.  Great care is taken when placing the radio frequency needles, block needles or lesioning probes, but sometimes complications can occur. 1. Infection: Any time there is an injection through the skin, there is a risk of infection.  This is why sterile conditions are used for these blocks.  There are four possible types of infection. 1. Localized skin infection. 2. Central Nervous System Infection-This can be in the form of Meningitis, which can be deadly. 3. Epidural Infections-This can be in  the form of an epidural abscess, which can cause pressure inside of the spine, causing compression of the spinal cord with subsequent paralysis. This would require an emergency surgery to decompress, and there are no guarantees that the patient would recover from the paralysis. 4. Discitis-This is an infection of the intervertebral  discs.  It occurs in about 1% of discography procedures.  It is difficult to treat and it may lead to surgery.        2. Pain: the needles have to go through skin and soft tissues, will cause soreness.       3. Damage to internal structures:  The nerves to be lesioned may be near blood vessels or    other nerves which can be potentially damaged.       4. Bleeding: Bleeding is more common if the patient is taking blood thinners such as  aspirin, Coumadin, Ticiid, Plavix, etc., or if he/she have some genetic predisposition  such as hemophilia. Bleeding into the spinal canal can cause compression of the spinal  cord with subsequent paralysis.  This would require an emergency surgery to  decompress and there are no guarantees that the patient would recover from the  paralysis.       5. Pneumothorax:  Puncturing of a lung is a possibility, every time a needle is introduced in  the area of the chest or upper back.  Pneumothorax refers to free air around the  collapsed lung(s), inside of the thoracic cavity (chest cavity).  Another two possible  complications related to a similar event would include: Hemothorax and Chylothorax.   These are variations of the Pneumothorax, where instead of air around the collapsed  lung(s), you may have blood or chyle, respectively.       6. Spinal headaches: They may occur with any procedures in the area of the spine.       7. Persistent CSF (Cerebro-Spinal Fluid) leakage: This is a rare problem, but may occur  with prolonged intrathecal or epidural catheters either due to the formation of a fistulous  track or a dural tear.       8. Nerve damage: By working so close to the spinal cord, there is always a possibility of  nerve damage, which could be as serious as a permanent spinal cord injury with  paralysis.       9. Death:  Although rare, severe deadly allergic reactions known as "Anaphylactic  reaction" can occur to any of the medications used.      10. Worsening of the  symptoms:  We can always make thing worse.  What are the chances of something like this happening? Chances of any of this occuring are extremely low.  By statistics, you have more of a chance of getting killed in a motor vehicle accident: while driving to the hospital than any of the above occurring .  Nevertheless, you should be aware that they are possibilities.  In general, it is similar to taking a shower.  Everybody knows that you can slip, hit your head and get killed.  Does that mean that you should not shower again?  Nevertheless always keep in mind that statistics do not mean anything if you happen to be on the wrong side of them.  Even if a procedure has a 1 (one) in a 1,000,000 (million) chance of going wrong, it you happen to be that one..Also, keep in mind that by statistics, you have more of a chance of having something go  wrong when taking medications.  Who should not have this procedure? If you are on a blood thinning medication (e.g. Coumadin, Plavix, see list of "Blood Thinners"), or if you have an active infection going on, you should not have the procedure.  If you are taking any blood thinners, please inform your physician.  How should I prepare for this procedure?  Do not eat or drink anything at least six hours prior to the procedure.  Bring a driver with you .  It cannot be a taxi.  Come accompanied by an adult that can drive you back, and that is strong enough to help you if your legs get weak or numb from the local anesthetic.  Take all of your medicines the morning of the procedure with just enough water to swallow them.  If you have diabetes, make sure that you are scheduled to have your procedure done first thing in the morning, whenever possible.  If you have diabetes, take only half of your insulin dose and notify our nurse that you have done so as soon as you arrive at the clinic.  If you are diabetic, but only take blood sugar pills (oral hypoglycemic), then do  not take them on the morning of your procedure.  You may take them after you have had the procedure.  Do not take aspirin or any aspirin-containing medications, at least eleven (11) days prior to the procedure.  They may prolong bleeding.  Wear loose fitting clothing that may be easy to take off and that you would not mind if it got stained with Betadine or blood.  Do not wear any jewelry or perfume  Remove any nail coloring.  It will interfere with some of our monitoring equipment.  NOTE: Remember that this is not meant to be interpreted as a complete list of all possible complications.  Unforeseen problems may occur.  BLOOD THINNERS The following drugs contain aspirin or other products, which can cause increased bleeding during surgery and should not be taken for 2 weeks prior to and 1 week after surgery.  If you should need take something for relief of minor pain, you may take acetaminophen which is found in Tylenol,m Datril, Anacin-3 and Panadol. It is not blood thinner. The products listed below are.  Do not take any of the products listed below in addition to any listed on your instruction sheet.  A.P.C or A.P.C with Codeine Codeine Phosphate Capsules #3 Ibuprofen Ridaura  ABC compound Congesprin Imuran rimadil  Advil Cope Indocin Robaxisal  Alka-Seltzer Effervescent Pain Reliever and Antacid Coricidin or Coricidin-D  Indomethacin Rufen  Alka-Seltzer plus Cold Medicine Cosprin Ketoprofen S-A-C Tablets  Anacin Analgesic Tablets or Capsules Coumadin Korlgesic Salflex  Anacin Extra Strength Analgesic tablets or capsules CP-2 Tablets Lanoril Salicylate  Anaprox Cuprimine Capsules Levenox Salocol  Anexsia-D Dalteparin Magan Salsalate  Anodynos Darvon compound Magnesium Salicylate Sine-off  Ansaid Dasin Capsules Magsal Sodium Salicylate  Anturane Depen Capsules Marnal Soma  APF Arthritis pain formula Dewitt's Pills Measurin Stanback  Argesic Dia-Gesic Meclofenamic Sulfinpyrazone   Arthritis Bayer Timed Release Aspirin Diclofenac Meclomen Sulindac  Arthritis pain formula Anacin Dicumarol Medipren Supac  Analgesic (Safety coated) Arthralgen Diffunasal Mefanamic Suprofen  Arthritis Strength Bufferin Dihydrocodeine Mepro Compound Suprol  Arthropan liquid Dopirydamole Methcarbomol with Aspirin Synalgos  ASA tablets/Enseals Disalcid Micrainin Tagament  Ascriptin Doan's Midol Talwin  Ascriptin A/D Dolene Mobidin Tanderil  Ascriptin Extra Strength Dolobid Moblgesic Ticlid  Ascriptin with Codeine Doloprin or Doloprin with Codeine Momentum Tolectin  Asperbuf Duoprin  Mono-gesic Trendar  Aspergum Duradyne Motrin or Motrin IB Triminicin  Aspirin plain, buffered or enteric coated Durasal Myochrisine Trigesic  Aspirin Suppositories Easprin Nalfon Trillsate  Aspirin with Codeine Ecotrin Regular or Extra Strength Naprosyn Uracel  Atromid-S Efficin Naproxen Ursinus  Auranofin Capsules Elmiron Neocylate Vanquish  Axotal Emagrin Norgesic Verin  Azathioprine Empirin or Empirin with Codeine Normiflo Vitamin E  Azolid Emprazil Nuprin Voltaren  Bayer Aspirin plain, buffered or children's or timed BC Tablets or powders Encaprin Orgaran Warfarin Sodium  Buff-a-Comp Enoxaparin Orudis Zorpin  Buff-a-Comp with Codeine Equegesic Os-Cal-Gesic   Buffaprin Excedrin plain, buffered or Extra Strength Oxalid   Bufferin Arthritis Strength Feldene Oxphenbutazone   Bufferin plain or Extra Strength Feldene Capsules Oxycodone with Aspirin   Bufferin with Codeine Fenoprofen Fenoprofen Pabalate or Pabalate-SF   Buffets II Flogesic Panagesic   Buffinol plain or Extra Strength Florinal or Florinal with Codeine Panwarfarin   Buf-Tabs Flurbiprofen Penicillamine   Butalbital Compound Four-way cold tablets Penicillin   Butazolidin Fragmin Pepto-Bismol   Carbenicillin Geminisyn Percodan   Carna Arthritis Reliever Geopen Persantine   Carprofen Gold's salt Persistin   Chloramphenicol Goody's Phenylbutazone    Chloromycetin Haltrain Piroxlcam   Clmetidine heparin Plaquenil   Cllnoril Hyco-pap Ponstel   Clofibrate Hydroxy chloroquine Propoxyphen         Before stopping any of these medications, be sure to consult the physician who ordered them.  Some, such as Coumadin (Warfarin) are ordered to prevent or treat serious conditions such as "deep thrombosis", "pumonary embolisms", and other heart problems.  The amount of time that you may need off of the medication may also vary with the medication and the reason for which you were taking it.  If you are taking any of these medications, please make sure you notify your pain physician before you undergo any procedures.

## 2015-09-05 NOTE — Progress Notes (Signed)
PROCEDURE PERFORMED: Lumbosacral selective nerve root block   NOTE: The patient is a 40 y.o. female who [facility] to [facility] for further evaluation and treatment of pain involving the lumbar and lower extremity region. Studies consisting of MRI has revealed the patient to be with evidene of . Small central disc protrusion is noted at L5-S1. This along with facetal hypertrophy  results in mild narrowing of the right neural foramen and mild edema in  the right S1 nerve root. No other significant abnormalities are identified.  The paraspinal spaces are normal. Renal cyst versus extrarenal pelvis is noted  on the right.   IMPRESSION:   Small disc protrusion L5-S1. This is central to right paracentral. This  along with facetal hypertrophy results in mild narrowing of the right  neural  foramen and mild swelling of the right S1 nerve root.  There is concern regarding intraspinal abnormalities contributing to the patient's symptomatology. There is being concern regarding component of lumbar radiculopathy contributing to patient's symptomatology. The patient is also with history of injury of the left lower extremity and there is also concern regarding component of patient's pain being due to complex regional pain syndrome. The risks, benefits, and expectations of the procedure have been explained to the patient who was understanding and in agreement with suggested treatment plan. We will proceed with interventional treatment as discussed and as explained to the patient. The patient is understanding and in agreement with suggested treatment plan.   DESCRIPTION OF PROCEDURE: Lumbosacral selective nerve root block with IV Versed, IV fentanyl conscious sedation, EKG, blood pressure, pulse, capnography, and pulse oximetry monitoring. The procedure was performed with the patient in the prone position under fluoroscopic guidance. With the patient in the prone position, Betadine prep of  proposed entry site was performed. Local anesthetic skin wheal of proposed needle entry site was prepared with 1.5% plain lidocaine with AP view of the lumbosacral spine.   PROCEDURE #1: Needle placement at the left L 3 vertebral body: A 22 -gauge needle was inserted at the inferior border of the transverse process of the vertebral body with needle placed medial to the midline of the transverse process on AP view of the lumbosacral spine.   NEEDLE PLACEMENT AT  L 4 AND L5  VERTEBRAL BODY LEVELS  Needle  placement was accomplished at L4 and L5  vertebral body levels on the left side exactly as was accomplished at the L3  vertebral body level  and utilizing the same technique and under fluoroscopic guidance.  PROCEDURE #4: Needle placement at the S1 foramen. With the patient in the prone position with Betadine prep of proposed entry site accomplished, the S1 foramen was visualized under fluoroscopic guidance with AP view of the lumbosacral spine with cephalad orientation of the fluoroscope with local anesthetic skin wheal of 1.5% lidocaine of proposed needle entry site prepared. A 22-gauge needle was inserted S1 foramen under fluoroscopic guidance eliciting paresthesias radiating from the buttocks to the lower extremity after which needle was slightly withdrawn.   Needle placement was then verified on lateral view at all levels with needle tip documented to be in the posterior superior quadrant of the intervertebral foramen of  L 3, L4, L5, and needle tip documented at the level of the S1 foramen. Following negative aspiration for heme and CSF at each level, each level was injected with 3 mL of 0.25% bupivacaine with Kenalog.   The patient tolerated the procedure well.   A total of  10 mg of Kenalog was utilized for the procedure.   PLAN:  1. Medications: Will continue presently prescribed medications tramadol and oxycodone. 2. The patient is to undergo follow-up evaluation with PCP for evaluation  of blood pressure and general medical condition status post procedure performed on today's visit. 3. Surgical follow-up evaluation. 4. Neurological evaluation. 5. May consider radiofrequency procedures, implantation type procedures and other treatment pending response to treatment and follow-up evaluation. 6. The patient has been advised do adhere to proper body mechanics and avoid activities which may aggravate condition. 7. The patient has been advised to call the [facility] Management Center prior to scheduled return appointment should there be significant change in the patient's condition or should the patient have other concerns regarding condition prior to scheduled return appointment.

## 2015-09-06 ENCOUNTER — Telehealth: Payer: Self-pay | Admitting: *Deleted

## 2015-09-06 NOTE — Telephone Encounter (Signed)
Patient denies any problems or concerns from procedure on yesterday.  

## 2015-09-11 ENCOUNTER — Ambulatory Visit: Payer: Medicaid Other | Admitting: Pain Medicine

## 2015-09-18 ENCOUNTER — Telehealth: Payer: Self-pay | Admitting: *Deleted

## 2015-09-30 ENCOUNTER — Other Ambulatory Visit: Payer: Self-pay | Admitting: Pain Medicine

## 2015-10-01 ENCOUNTER — Other Ambulatory Visit: Payer: Self-pay | Admitting: Pain Medicine

## 2015-10-02 ENCOUNTER — Ambulatory Visit: Payer: Medicare Other | Admitting: Pain Medicine

## 2015-10-04 ENCOUNTER — Other Ambulatory Visit: Payer: Self-pay | Admitting: Pain Medicine

## 2015-11-05 ENCOUNTER — Ambulatory Visit: Payer: Medicare Other | Admitting: Pain Medicine

## 2016-01-07 HISTORY — PX: CARPAL TUNNEL RELEASE: SHX101

## 2016-03-22 ENCOUNTER — Other Ambulatory Visit: Payer: Self-pay | Admitting: Pain Medicine

## 2016-07-29 ENCOUNTER — Other Ambulatory Visit: Payer: Self-pay | Admitting: Obstetrics and Gynecology

## 2016-07-29 DIAGNOSIS — Z1231 Encounter for screening mammogram for malignant neoplasm of breast: Secondary | ICD-10-CM

## 2016-08-25 ENCOUNTER — Emergency Department
Admission: EM | Admit: 2016-08-25 | Discharge: 2016-08-26 | Disposition: A | Discharge status: Other Acute Inpatient Hospital | Payer: Medicare Other | Attending: Emergency Medicine | Admitting: Emergency Medicine

## 2016-08-25 ENCOUNTER — Encounter: Payer: Self-pay | Admitting: Emergency Medicine

## 2016-08-25 DIAGNOSIS — R45850 Homicidal ideations: Secondary | ICD-10-CM | POA: Insufficient documentation

## 2016-08-25 DIAGNOSIS — F99 Mental disorder, not otherwise specified: Secondary | ICD-10-CM | POA: Diagnosis present

## 2016-08-25 DIAGNOSIS — Z87891 Personal history of nicotine dependence: Secondary | ICD-10-CM | POA: Diagnosis not present

## 2016-08-25 DIAGNOSIS — F603 Borderline personality disorder: Secondary | ICD-10-CM | POA: Diagnosis present

## 2016-08-25 DIAGNOSIS — I10 Essential (primary) hypertension: Secondary | ICD-10-CM | POA: Diagnosis not present

## 2016-08-25 DIAGNOSIS — F3181 Bipolar II disorder: Secondary | ICD-10-CM | POA: Diagnosis not present

## 2016-08-25 DIAGNOSIS — Z79899 Other long term (current) drug therapy: Secondary | ICD-10-CM | POA: Insufficient documentation

## 2016-08-25 DIAGNOSIS — R45851 Suicidal ideations: Secondary | ICD-10-CM | POA: Diagnosis not present

## 2016-08-25 LAB — CBC
HCT: 43.4 % (ref 35.0–47.0)
Hemoglobin: 14.5 g/dL (ref 12.0–16.0)
MCH: 29.5 pg (ref 26.0–34.0)
MCHC: 33.3 g/dL (ref 32.0–36.0)
MCV: 88.6 fL (ref 80.0–100.0)
Platelets: 542 10*3/uL — ABNORMAL HIGH (ref 150–440)
RBC: 4.9 MIL/uL (ref 3.80–5.20)
RDW: 15.9 % — ABNORMAL HIGH (ref 11.5–14.5)
WBC: 15.7 10*3/uL — ABNORMAL HIGH (ref 3.6–11.0)

## 2016-08-25 LAB — ETHANOL: Alcohol, Ethyl (B): <5 mg/dL (ref <5)

## 2016-08-25 LAB — URINE DRUG SCREEN, QUALITATIVE (ARMC ONLY)
Amphetamines, Ur Screen: NOT DETECTED
Barbiturates, Ur Screen: NOT DETECTED
Benzodiazepine, Ur Scrn: NOT DETECTED
Cannabinoid 50 Ng, Ur ~~LOC~~: NOT DETECTED
Cocaine Metabolite,Ur ~~LOC~~: NOT DETECTED
MDMA (Ecstasy)Ur Screen: NOT DETECTED
Methadone Scn, Ur: NOT DETECTED
Opiate, Ur Screen: NOT DETECTED
Phencyclidine (PCP) Ur S: NOT DETECTED
Tricyclic, Ur Screen: NOT DETECTED

## 2016-08-25 LAB — COMPREHENSIVE METABOLIC PANEL
ALT: 18 U/L (ref 14–54)
AST: 23 U/L (ref 15–41)
Albumin: 4.4 g/dL (ref 3.5–5.0)
Alkaline Phosphatase: 63 U/L (ref 38–126)
Anion gap: 12 (ref 5–15)
BUN: 13 mg/dL (ref 6–20)
CO2: 28 mmol/L (ref 22–32)
Calcium: 9.9 mg/dL (ref 8.9–10.3)
Chloride: 98 mmol/L — ABNORMAL LOW (ref 101–111)
Creatinine, Ser: 1.1 mg/dL — ABNORMAL HIGH (ref 0.44–1.00)
GFR calc Af Amer: >60 mL/min (ref >60)
GFR calc non Af Amer: >60 mL/min (ref >60)
Glucose, Bld: 92 mg/dL (ref 65–99)
Potassium: 2.8 mmol/L — ABNORMAL LOW (ref 3.5–5.1)
Sodium: 138 mmol/L (ref 135–145)
Total Bilirubin: 0.3 mg/dL (ref 0.3–1.2)
Total Protein: 8 g/dL (ref 6.5–8.1)

## 2016-08-25 LAB — ACETAMINOPHEN LEVEL: Acetaminophen (Tylenol), Serum: <10 ug/mL — ABNORMAL LOW (ref 10–30)

## 2016-08-25 LAB — SALICYLATE LEVEL: Salicylate Lvl: 15.1 mg/dL (ref 2.8–30.0)

## 2016-08-25 LAB — POCT PREGNANCY, URINE: Preg Test, Ur: NEGATIVE

## 2016-08-25 MED ORDER — TRAZODONE HCL 100 MG PO TABS
200.0000 mg | ORAL_TABLET | Freq: Every day | ORAL | Status: DC
  Administered 2016-08-25: 200 mg via ORAL
  Filled 2016-08-25: qty 2

## 2016-08-25 MED ORDER — TIOTROPIUM BROMIDE MONOHYDRATE 18 MCG IN CAPS
18.0000 ug | ORAL_CAPSULE | Freq: Every day | RESPIRATORY_TRACT | Status: DC
  Administered 2016-08-25 – 2016-08-26 (×2): 18 ug via RESPIRATORY_TRACT
  Filled 2016-08-25: qty 5

## 2016-08-25 MED ORDER — LEVOTHYROXINE SODIUM 50 MCG PO TABS
50.0000 ug | ORAL_TABLET | Freq: Every day | ORAL | Status: DC
  Administered 2016-08-26: 50 ug via ORAL
  Filled 2016-08-25: qty 1

## 2016-08-25 MED ORDER — ALBUTEROL SULFATE HFA 108 (90 BASE) MCG/ACT IN AERS
2.0000 | INHALATION_SPRAY | RESPIRATORY_TRACT | Status: DC | PRN
  Filled 2016-08-25: qty 6.7

## 2016-08-25 MED ORDER — LAMOTRIGINE 100 MG PO TABS
200.0000 mg | ORAL_TABLET | Freq: Every day | ORAL | Status: DC
  Administered 2016-08-25 – 2016-08-26 (×2): 200 mg via ORAL
  Filled 2016-08-25 (×2): qty 2

## 2016-08-25 MED ORDER — METHYLPHENIDATE HCL 5 MG PO TABS
10.0000 mg | ORAL_TABLET | Freq: Three times a day (TID) | ORAL | Status: DC
  Administered 2016-08-25 – 2016-08-26 (×2): 10 mg via ORAL
  Filled 2016-08-25 (×2): qty 2

## 2016-08-25 MED ORDER — OXYCODONE HCL 5 MG PO TABS: 5.0000 mg | ORAL_TABLET | Freq: Two times a day (BID) | ORAL | Status: DC | PRN

## 2016-08-25 MED ORDER — TRAMADOL HCL 50 MG PO TABS
50.0000 mg | ORAL_TABLET | Freq: Three times a day (TID) | ORAL | Status: DC
  Administered 2016-08-25 – 2016-08-26 (×2): 50 mg via ORAL
  Filled 2016-08-25 (×2): qty 1

## 2016-08-25 MED ORDER — FAMOTIDINE 20 MG PO TABS
20.0000 mg | ORAL_TABLET | Freq: Every day | ORAL | Status: DC
  Administered 2016-08-25 – 2016-08-26 (×2): 20 mg via ORAL
  Filled 2016-08-25 (×3): qty 1

## 2016-08-25 MED ORDER — VENLAFAXINE HCL 37.5 MG PO TABS
37.5000 mg | ORAL_TABLET | Freq: Every day | ORAL | Status: DC
  Administered 2016-08-26: 37.5 mg via ORAL
  Filled 2016-08-25: qty 1

## 2016-08-25 NOTE — Consult Note (Signed)
[facility] Consult   Reason for Consult:  Consult for 41 year old woman presented to the emergency room with complaints of depression and suicidal and homicidal ideation Referring Physician:  Clearnce Hasten Patient Identification: Kirsten Richardson MRN:  [id number] Principal Diagnosis: Bipolar 2 disorder, major depressive episode [facility]) Diagnosis:   Patient Active Problem List   Diagnosis Date Noted  . Bipolar 2 disorder, major depressive episode ([place]) [F31.81] 08/25/2016  . Cervical disc disorder with radiculopathy of cervical region [M50.10] 08/20/2015  . DDD (degenerative disc disease), lumbar [M51.36] 08/02/2015  . Facet syndrome, lumbar ([place]) [M46.96] 08/02/2015  . Sacroiliac joint dysfunction [M53.3] 08/02/2015  . DDD (degenerative disc disease), cervical [M50.30] 08/02/2015  . Cervical facet syndrome ([place]) [M46.92] 08/02/2015  . Idiopathic scoliosis [M41.20] 08/02/2015  . Complex regional pain syndrome i of left lower limb [G90.522] 08/02/2015  . Bilateral occipital neuralgia [M54.81] 08/02/2015  . Pain in joint, ankle and foot [M25.579] 09/27/2014  . CRPS 1 (complex regional pain syndrome I) of lower limb [G90.529] 09/27/2014  . Hip pain, chronic [M25.559, G89.29] 09/27/2014  . Primary osteoarthritis of left hip [M16.12] 09/27/2014  . Borderline personality disorder [F60.3] 09/27/2014  . Congenital scoliosis [Q67.5] 09/27/2014  . Bipolar affective disorder, current episode hypomanic ([place]) [F31.0] 09/27/2014    Total Time spent with patient: 1 hour  Subjective:   Kirsten Richardson is a 41 y.o. female patient admitted with "my meds aren't working".  HPI:  Patient interviewed chart reviewed. 41 year old woman with history of chronic mood problems. She says that for the last several months she feels like her medicines are not working. She hasn't been feeling more depressed down and hopeless. Feels tired and sleepy a lot of the time. No longer enjoying things. The last  several days however she has started to have angry feelings and thoughts about hurting someone. Today she thought about wanting to kill someone on the highway who cut her off and she's been feeling angry and homicidal towards her sister. Also having suicidal thoughts towards her self. Vague visual and auditory hallucinations. She is compliant with her prescribed medication except for her Abilify which she self discontinued. She denies abusing alcohol or drugs currently.  Medical history: Chronic pain from multiple orthopedic problems. Hypothyroid. COPD.  Social history: Lives by herself. Has a sister that she has in on again off again difficult relationship with. Has some adult children who check in on her occasionally.  Substance abuse history: Reports that she drank a lot when she was an adolescent but hasn't been drinking or using any drugs in years.  Past Psychiatric History: Patient has had right or suicide attempts but the last one was in 2010. Last hospitalization was here in 2008. She has been followed at [facility] by Dr. Randel Books. Not yet seeing a therapist. Has been on multiple medicines in the past. Her current Effexor Lamictal trazodone and Ritalin and the Abilify were supposedly helpful until a few months ago. Diagnosis of bipolar 2 as been suggested as well as borderline personality disorder  Risk to Self: Is patient at risk for suicide?: No Risk to Others:   Prior Inpatient Therapy:   Prior Outpatient Therapy:    Past Medical History:  Past Medical History:  Diagnosis Date  . Anemia   . Arthritis   . Degenerative disc disease, lumbar   . Depression   . Emphysema of lung ([place])   . GERD (gastroesophageal reflux disease)   . Hypertension   . Plantar fasciitis   . Thyroid  disease     Past Surgical History:  Procedure Laterality Date  . FOOT SURGERY Right    plantar fasciatis  . HIP FRACTURE SURGERY Bilateral    Family History:  Family History  Problem Relation Age of Onset  .  Arthritis Mother   . COPD Mother   . Cancer Mother   . Depression Mother   . Early death Mother   . Vision loss Mother   . Mental illness Mother   . Alcohol abuse Father   . Arthritis Father   . Cancer Father   . Diabetes Father   . Drug abuse Father   . Early death Father   . Vision loss Father   . Heart disease Father   . Hypertension Father   . Mental illness Father   . Stroke Father    Family Psychiatric  History: Positive for depression Social History:  History  Alcohol Use No     History  Drug Use No    Social History   Social History  . Marital status: Single    Spouse name: N/A  . Number of children: N/A  . Years of education: N/A   Social History Main Topics  . Smoking status: Former Smoker    Packs/day: 0.20    Types: Cigarettes    Quit date: 07/25/2015  . Smokeless tobacco: Never Used  . Alcohol use No  . Drug use: No  . Sexual activity: Yes    Birth control/ protection: Pill   Other Topics Concern  . None   Social History Narrative  . None   Additional Social History:    Allergies:  No Known Allergies  Labs:  Results for orders placed or performed during the hospital encounter of 08/25/16 (from the past 48 hour(s))  Comprehensive metabolic panel     Status: Abnormal   Collection Time: 08/25/16  5:28 PM  Result Value Ref Range   Sodium 138 135 - 145 mmol/L   Potassium 2.8 (L) 3.5 - 5.1 mmol/L   Chloride 98 (L) 101 - 111 mmol/L   CO2 28 22 - 32 mmol/L   Glucose, Bld 92 65 - 99 mg/dL   BUN 13 6 - 20 mg/dL   Creatinine, Ser 1.10 (H) 0.44 - 1.00 mg/dL   Calcium 9.9 8.9 - 10.3 mg/dL   Total Protein 8.0 6.5 - 8.1 g/dL   Albumin 4.4 3.5 - 5.0 g/dL   AST 23 15 - 41 U/L   ALT 18 14 - 54 U/L   Alkaline Phosphatase 63 38 - 126 U/L   Total Bilirubin 0.3 0.3 - 1.2 mg/dL   GFR calc non Af Amer >60 >60 mL/min   GFR calc Af Amer >60 >60 mL/min    Comment: (NOTE) The eGFR has been calculated using the CKD EPI equation. This calculation has not  been validated in all clinical situations. eGFR's persistently <60 mL/min signify possible Chronic Kidney Disease.    Anion gap 12 5 - 15  Ethanol     Status: None   Collection Time: 08/25/16  5:28 PM  Result Value Ref Range   Alcohol, Ethyl (B) <5 <5 mg/dL    Comment:        LOWEST DETECTABLE LIMIT FOR SERUM ALCOHOL IS 5 mg/dL FOR MEDICAL PURPOSES ONLY   Salicylate level     Status: None   Collection Time: 08/25/16  5:28 PM  Result Value Ref Range   Salicylate Lvl 85.6 2.8 - 30.0 mg/dL  Acetaminophen level  Status: Abnormal   Collection Time: 08/25/16  5:28 PM  Result Value Ref Range   Acetaminophen (Tylenol), Serum <10 (L) 10 - 30 ug/mL    Comment:        THERAPEUTIC CONCENTRATIONS VARY SIGNIFICANTLY. A RANGE OF 10-30 ug/mL MAY BE AN EFFECTIVE CONCENTRATION FOR MANY PATIENTS. HOWEVER, SOME ARE BEST TREATED AT CONCENTRATIONS OUTSIDE THIS RANGE. ACETAMINOPHEN CONCENTRATIONS >150 ug/mL AT 4 HOURS AFTER INGESTION AND >50 ug/mL AT 12 HOURS AFTER INGESTION ARE OFTEN ASSOCIATED WITH TOXIC REACTIONS.   cbc     Status: Abnormal   Collection Time: 08/25/16  5:28 PM  Result Value Ref Range   WBC 15.7 (H) 3.6 - 11.0 K/uL   RBC 4.90 3.80 - 5.20 MIL/uL   Hemoglobin 14.5 12.0 - 16.0 g/dL   HCT 43.4 35.0 - 47.0 %   MCV 88.6 80.0 - 100.0 fL   MCH 29.5 26.0 - 34.0 pg   MCHC 33.3 32.0 - 36.0 g/dL   RDW 15.9 (H) 11.5 - 14.5 %   Platelets 542 (H) 150 - 440 K/uL  Urine Drug Screen, Qualitative     Status: None   Collection Time: 08/25/16  5:29 PM  Result Value Ref Range   Tricyclic, Ur Screen NONE DETECTED NONE DETECTED   Amphetamines, Ur Screen NONE DETECTED NONE DETECTED   MDMA (Ecstasy)Ur Screen NONE DETECTED NONE DETECTED   Cocaine Metabolite,Ur [place] NONE DETECTED NONE DETECTED   Opiate, Ur Screen NONE DETECTED NONE DETECTED   Phencyclidine (PCP) Ur S NONE DETECTED NONE DETECTED   Cannabinoid 50 Ng, Ur [place] NONE DETECTED NONE DETECTED   Barbiturates, Ur Screen NONE DETECTED  NONE DETECTED   Benzodiazepine, Ur Scrn NONE DETECTED NONE DETECTED   Methadone Scn, Ur NONE DETECTED NONE DETECTED    Comment: (NOTE) 630  Tricyclics, urine               Cutoff 1000 ng/mL 200  Amphetamines, urine             Cutoff 1000 ng/mL 300  MDMA (Ecstasy), urine           Cutoff 500 ng/mL 400  Cocaine Metabolite, urine       Cutoff 300 ng/mL 500  Opiate, urine                   Cutoff 300 ng/mL 600  Phencyclidine (PCP), urine      Cutoff 25 ng/mL 700  Cannabinoid, urine              Cutoff 50 ng/mL 800  Barbiturates, urine             Cutoff 200 ng/mL 900  Benzodiazepine, urine           Cutoff 200 ng/mL 1000 Methadone, urine                Cutoff 300 ng/mL 1100 1200 The urine drug screen provides only a preliminary, unconfirmed 1300 analytical test result and should not be used for non-medical 1400 purposes. Clinical consideration and professional judgment should 1500 be applied to any positive drug screen result due to possible 1600 interfering substances. A more specific alternate chemical method 1700 must be used in order to obtain a confirmed analytical result.  1800 Gas chromato graphy / mass spectrometry (GC/MS) is the preferred 1900 confirmatory method.   Pregnancy, urine POC     Status: None   Collection Time: 08/25/16  5:49 PM  Result Value Ref Range   Preg Test,  Ur NEGATIVE NEGATIVE    Comment:        THE SENSITIVITY OF THIS METHODOLOGY IS >24 mIU/mL     Current Facility-Administered Medications  Medication Dose Route Frequency Provider Last Rate Last Dose  . albuterol (PROVENTIL HFA;VENTOLIN HFA) 108 (90 Base) MCG/ACT inhaler 2 puff  2 puff Inhalation Q4H PRN Virgia Kelner T, MD      . bupivacaine (PF) (MARCAINE) 0.25 % injection 20 mL  20 mL Infiltration Once Lance Bosch, MD      . ceFAZolin (ANCEF) IVPB 1 g/50 mL premix  1 g Intravenous Once Mohammed Kindle, MD      . ceFAZolin (ANCEF) IVPB 1 g/50 mL premix  1 g Intravenous Once Mohammed Kindle, MD       . famotidine (PEPCID) tablet 20 mg  20 mg Oral Daily Andrya Roppolo, Madie Reno, MD      . fentaNYL (SUBLIMAZE) injection 50 mcg  50 mcg Intravenous Harriett Rush, MD      . fentaNYL (SUBLIMAZE) injection 50 mcg  50 mcg Intravenous Harriett Rush, MD      . iohexol (OMNIPAQUE) 180 MG/ML injection 20 mL  20 mL Intravenous PRN Lance Bosch, MD      . iopamidol (ISOVUE-M) 41 % intrathecal injection 20 mL  20 mL Epidural PRN Lance Bosch, MD      . lactated ringers infusion 1,000 mL  1,000 mL Intravenous Continuous Mohammed Kindle, MD      . lactated ringers infusion 1,000 mL  1,000 mL Intravenous Continuous Mohammed Kindle, MD 125 mL/hr at 08/29/15 1004 1,000 mL at 08/29/15 1004  . lactated ringers infusion 1,000 mL  1,000 mL Intravenous Continuous Mohammed Kindle, MD      . lamoTRIgine (LAMICTAL) tablet 200 mg  200 mg Oral Daily Tahirah Sara, Madie Reno, MD      . Derrill Memo ON 08/26/2016] levothyroxine (SYNTHROID, LEVOTHROID) tablet 50 mcg  50 mcg Oral QAC breakfast Tabetha Haraway T, MD      . lidocaine (PF) (XYLOCAINE) 1 % injection 10 mL  10 mL Subcutaneous Once Mohammed Kindle, MD      . methylphenidate (RITALIN) tablet 10 mg  10 mg Oral TID PC & HS Montrae Braithwaite, Madie Reno, MD      . midazolam (VERSED) 5 MG/5ML injection 1 mg  1 mg Intravenous UD Lance Bosch, MD      . midazolam (VERSED) 5 MG/5ML injection 1 mg  1 mg Intravenous UD Lance Bosch, MD      . orphenadrine (NORFLEX) injection 60 mg  60 mg Intramuscular Once Mohammed Kindle, MD      . oxyCODONE (Oxy IR/ROXICODONE) immediate release tablet 5 mg  5 mg Oral Q12H PRN Gene Colee, Madie Reno, MD      . tiotropium (SPIRIVA) inhalation capsule 18 mcg  18 mcg Inhalation Daily Airyn Ellzey T, MD      . traMADol Veatrice Bourbon) tablet 50 mg  50 mg Oral TID Tavoris Brisk, Madie Reno, MD      . traZODone (DESYREL) tablet 200 mg  200 mg Oral QHS Ostin Mathey T, MD      . triamcinolone acetonide (KENALOG-40) injection 80 mg  80 mg Intramuscular Once Lance Bosch, MD      . Derrill Memo  ON 08/26/2016] venlafaxine [facility]) tablet 37.5 mg  37.5 mg Oral QPC breakfast Onalee Steinbach, Madie Reno, MD       Current Outpatient Prescriptions  Medication Sig Dispense Refill  . albuterol (PROVENTIL) (2.5 MG/3ML) 0.083% nebulizer solution Take 2.5 mg by nebulization  as needed for wheezing or shortness of breath.    . ARIPiprazole (ABILIFY) 20 MG tablet Take 20 mg by mouth daily.    . benztropine (COGENTIN) 2 MG tablet Take 2 mg by mouth 2 (two) times daily.    . budesonide-formoterol (SYMBICORT) 160-4.5 MCG/ACT inhaler Inhale 2 puffs into the lungs 2 (two) times daily.    . cefUROXime (CEFTIN) 250 MG tablet Take 1 tablet (250 mg total) by mouth 2 (two) times daily with a meal. (Patient not taking: Reported on 08/30/2015) 14 tablet 0  . cefUROXime (CEFTIN) 250 MG tablet Take 1 tablet (250 mg total) by mouth 2 (two) times daily with a meal. (Patient not taking: Reported on 09/05/2015) 14 tablet 0  . cefUROXime (CEFTIN) 250 MG tablet Take 1 tablet (250 mg total) by mouth 2 (two) times daily with a meal. 14 tablet 0  . desogestrel-ethinyl estradiol (APRI,EMOQUETTE,SOLIA) 0.15-30 MG-MCG tablet Take 1 tablet by mouth daily.    . diclofenac (FLECTOR) 1.3 % PTCH Place 1 patch onto the skin every morning.    . famotidine (PEPCID) 20 MG tablet Take 20 mg by mouth 2 (two) times daily.    . fluticasone (FLONASE) 50 MCG/ACT nasal spray Place into both nostrils daily.    Marland Kitchen lamoTRIgine (LAMICTAL) 200 MG tablet Take 200 mg by mouth daily.    . Levothyroxine Sodium 50 MCG CAPS Take 50 mcg by mouth every morning.    . Lido-Capsaicin-Men-Methyl Sal (MEDI-PATCH-LIDOCAINE) 0.5-0.035-5-20 % PTCH Apply topically.    . methylphenidate (RITALIN) 10 MG tablet Take 10 mg by mouth 4 (four) times daily.    Marland Kitchen oxycodone (OXY-IR) 5 MG capsule Take 5 mg by mouth every morning.    Marland Kitchen oxyCODONE-acetaminophen (ROXICET) 5-325 MG tablet Limit one half to one tablet by mouth per day for breakthrough pain while taking tramadol if tolerated 30  tablet 0  . tiotropium (SPIRIVA) 18 MCG inhalation capsule Place 18 mcg into inhaler and inhale daily.    Marland Kitchen tolterodine (DETROL) 2 MG tablet Take 2 mg by mouth every morning.    . traMADol (ULTRAM) 50 MG tablet Take 1 tablet (50 mg total) by mouth 3 (three) times daily. (Patient not taking: Reported on 08/30/2015) 90 tablet 0  . traMADol (ULTRAM) 50 MG tablet Limit 1 tablet by mouth per day or 2-3 times per day if tolerated 90 tablet 0  . traZODone (DESYREL) 100 MG tablet Take 200 mg by mouth at bedtime.    . varenicline (CHANTIX) 1 MG tablet Take 1 mg by mouth 2 (two) times daily.    Marland Kitchen venlafaxine (EFFEXOR) 37.5 MG tablet Take 37.5 mg by mouth daily.    Marland Kitchen venlafaxine XR (EFFEXOR-XR) 150 MG 24 hr capsule Take 300 mg by mouth daily with breakfast.       Musculoskeletal: Strength & Muscle Tone: within normal limits Gait & Station: normal Patient leans: N/A  Psychiatric Specialty Exam: Physical Exam  Nursing note and vitals reviewed. Constitutional: She appears well-developed and well-nourished.  HENT:  Head: Normocephalic and atraumatic.  Eyes: Pupils are equal, round, and reactive to light. Conjunctivae are normal.  Neck: Normal range of motion.  Cardiovascular: Regular rhythm and normal heart sounds.   Respiratory: Effort normal. No respiratory distress.  GI: Soft.  Musculoskeletal: Normal range of motion.  Neurological: She is alert.  Skin: Skin is warm and dry.  Psychiatric: Her speech is normal. Her mood appears anxious. She is slowed. Thought content is paranoid. She expresses impulsivity. She exhibits a depressed  mood. She expresses homicidal and suicidal ideation. She exhibits abnormal recent memory.    Review of Systems  Constitutional: Negative.   HENT: Negative.   Eyes: Negative.   Respiratory: Negative.   Cardiovascular: Negative.   Gastrointestinal: Negative.   Musculoskeletal: Positive for back pain, joint pain, myalgias and neck pain.  Skin: Negative.    Neurological: Negative.   Psychiatric/Behavioral: Positive for depression, hallucinations and suicidal ideas. Negative for memory loss and substance abuse. The patient is nervous/anxious. The patient does not have insomnia.     Blood pressure 136/79, pulse 77, temperature 99 F (37.2 C), temperature source Oral, resp. rate 16, height 5' (1.524 m), weight 70.3 kg (155 lb), last menstrual period 08/23/2016, SpO2 98 %.Body mass index is 30.27 kg/m.  General Appearance: Disheveled  Eye Contact:  Fair  Speech:  Slow  Volume:  Decreased  Mood:  Angry, Anxious and Depressed  Affect:  Congruent  Thought Process:  Goal Directed  Orientation:  Full (Time, Place, and Person)  Thought Content:  Rumination and Tangential  Suicidal Thoughts:  Yes.  with intent/plan  Homicidal Thoughts:  Yes.  without intent/plan  Memory:  Immediate;   Fair Recent;   Fair Remote;   Fair  Judgement:  Fair  Insight:  Fair  Psychomotor Activity:  Decreased  Concentration:  Concentration: Fair  Recall:  [employer] of Knowledge:  Fair  Language:  Fair  Akathisia:  No  Handed:  Right  AIMS (if indicated):     Assets:  Desire for Improvement Housing Resilience Social Support  ADL's:  Intact  Cognition:  WNL  Sleep:        Treatment Plan Summary: Daily contact with patient to assess and evaluate symptoms and progress in treatment, Medication management and Plan Patient reporting suicidal and homicidal ideation appears to be primarily depressed. Cooperative with treatment. She is agreeable to inpatient admission. Case reviewed with the ER physician and TTS. Orders completed. Admit to psychiatric hospital with 15 minute checks. Labs will be obtained.  Disposition: Recommend psychiatric Inpatient admission when medically cleared. Supportive therapy provided about ongoing stressors.  Alethia Berthold, MD 08/25/2016 6:55 PM
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
## 2016-08-25 NOTE — ED Provider Notes (Addendum)
[facility] Emergency Department Provider Note  ____________________________________________   First MD Initiated Contact with Patient 08/25/16 1626     (approximate)  I have reviewed the triage vital signs and the nursing notes.   HISTORY  Chief Complaint Homicidal and Suicidal   HPI Kirsten Richardson is a 41 y.o. female with a history of borderline personality disorder who is presenting to the emergency department today saying that she is having homicidal as well as suicidal ideation. She does not have any specific targets as far as other people. However, she says that she is having thoughts of slitting her throat. She said the last time she tried to commit suicide wasn't 2010 where she overdosed on pills. She denies any drinking or drug use. Denies any self-harm prior to arrival. Says that she has been compliant with her medications.   Past Medical History:  Diagnosis Date  . Anemia   . Arthritis   . Degenerative disc disease, lumbar   . Depression   . Emphysema of lung (HCC)   . GERD (gastroesophageal reflux disease)   . Hypertension   . Plantar fasciitis   . Thyroid disease     Patient Active Problem List   Diagnosis Date Noted  . Cervical disc disorder with radiculopathy of cervical region 08/20/2015  . DDD (degenerative disc disease), lumbar 08/02/2015  . Facet syndrome, lumbar (HCC) 08/02/2015  . Sacroiliac joint dysfunction 08/02/2015  . DDD (degenerative disc disease), cervical 08/02/2015  . Cervical facet syndrome (HCC) 08/02/2015  . Idiopathic scoliosis 08/02/2015  . Complex regional pain syndrome i of left lower limb 08/02/2015  . Bilateral occipital neuralgia 08/02/2015  . Pain in joint, ankle and foot 09/27/2014  . CRPS 1 (complex regional pain syndrome I) of lower limb 09/27/2014  . Hip pain, chronic 09/27/2014  . Primary osteoarthritis of left hip 09/27/2014  . Borderline personality disorder 09/27/2014  . Congenital scoliosis  09/27/2014  . Bipolar affective disorder, current episode hypomanic (HCC) 09/27/2014    Past Surgical History:  Procedure Laterality Date  . FOOT SURGERY Right    plantar fasciatis  . HIP FRACTURE SURGERY Bilateral     Prior to Admission medications   Medication Sig Start Date End Date Taking? Authorizing Provider  albuterol (PROVENTIL) (2.5 MG/3ML) 0.083% nebulizer solution Take 2.5 mg by nebulization as needed for wheezing or shortness of breath.    [provider]  ARIPiprazole (ABILIFY) 20 MG tablet Take 20 mg by mouth daily.    [provider]  benztropine (COGENTIN) 2 MG tablet Take 2 mg by mouth 2 (two) times daily.    [provider]  budesonide-formoterol (SYMBICORT) 160-4.5 MCG/ACT inhaler Inhale 2 puffs into the lungs 2 (two) times daily.    [provider]  cefUROXime (CEFTIN) 250 MG tablet Take 1 tablet (250 mg total) by mouth 2 (two) times daily with a meal. Patient not taking: Reported on 08/30/2015 08/08/15   Ewing Schlein, MD  cefUROXime (CEFTIN) 250 MG tablet Take 1 tablet (250 mg total) by mouth 2 (two) times daily with a meal. Patient not taking: Reported on 09/05/2015 08/29/15   Ewing Schlein, MD  cefUROXime (CEFTIN) 250 MG tablet Take 1 tablet (250 mg total) by mouth 2 (two) times daily with a meal. 09/05/15   Ewing Schlein, MD  desogestrel-ethinyl estradiol (APRI,EMOQUETTE,SOLIA) 0.15-30 MG-MCG tablet Take 1 tablet by mouth daily.    [provider]  diclofenac (FLECTOR) 1.3 % PTCH Place 1 patch onto the skin every  morning.    [provider]  famotidine (PEPCID) 20 MG tablet Take 20 mg by mouth 2 (two) times daily.    [provider]  fluticasone (FLONASE) 50 MCG/ACT nasal spray Place into both nostrils daily.    [provider]  lamoTRIgine (LAMICTAL) 200 MG tablet Take 200 mg by mouth daily.    [provider]  Levothyroxine Sodium 50 MCG CAPS Take 50 mcg by mouth every morning.     [provider]  Lido-Capsaicin-Men-Methyl Sal (MEDI-PATCH-LIDOCAINE) 0.5-0.035-5-20 % PTCH Apply topically.    [provider]  methylphenidate (RITALIN) 10 MG tablet Take 10 mg by mouth 4 (four) times daily.    [provider]  oxycodone (OXY-IR) 5 MG capsule Take 5 mg by mouth every morning.    [provider]  oxyCODONE-acetaminophen (ROXICET) 5-325 MG tablet Limit one half to one tablet by mouth per day for breakthrough pain while taking tramadol if tolerated 08/30/15   Ewing Schlein, MD  tiotropium (SPIRIVA) 18 MCG inhalation capsule Place 18 mcg into inhaler and inhale daily.    [provider]  tolterodine (DETROL) 2 MG tablet Take 2 mg by mouth every morning.    [provider]  traMADol (ULTRAM) 50 MG tablet Take 1 tablet (50 mg total) by mouth 3 (three) times daily. Patient not taking: Reported on 08/30/2015 12/18/14   Tod Persia, MD  traMADol Janean Sark) 50 MG tablet Limit 1 tablet by mouth per day or 2-3 times per day if tolerated 08/30/15   Ewing Schlein, MD  traZODone (DESYREL) 100 MG tablet Take 200 mg by mouth at bedtime.    [provider]  varenicline (CHANTIX) 1 MG tablet Take 1 mg by mouth 2 (two) times daily.    [provider]  venlafaxine (EFFEXOR) 37.5 MG tablet Take 37.5 mg by mouth daily.    [provider]  venlafaxine XR (EFFEXOR-XR) 150 MG 24 hr capsule Take 300 mg by mouth daily with breakfast.     [provider]    Allergies Patient has no known allergies.  Family History  Problem Relation Age of Onset  . Arthritis Mother   . COPD Mother   . Cancer Mother   . Depression Mother   . Early death Mother   . Vision loss Mother   . Mental illness Mother   . Alcohol abuse Father   . Arthritis Father   . Cancer Father   . Diabetes Father   . Drug abuse Father   . Early death Father   . Vision loss Father   . Heart disease Father   . Hypertension Father   . Mental  illness Father   . Stroke Father     Social History Social History  Substance Use Topics  . Smoking status: Former Smoker    Packs/day: 0.20    Types: Cigarettes    Quit date: 07/25/2015  . Smokeless tobacco: Never Used  . Alcohol use No    Review of Systems  Constitutional: No fever/chills Eyes: No visual changes. ENT: No sore throat. Cardiovascular: Denies chest pain. Respiratory: Denies shortness of breath. Gastrointestinal: No abdominal pain.  No nausea, no vomiting.  No diarrhea.  No constipation. Genitourinary: Negative for dysuria. Musculoskeletal: Negative for back pain. Skin: Negative for rash. Neurological: Negative for headaches, focal weakness or numbness.   ____________________________________________   PHYSICAL EXAM:  VITAL SIGNS: ED Triage Vitals  Enc Vitals Group     BP 08/25/16 1641 136/79  Pulse Rate 08/25/16 1641 77     Resp 08/25/16 1641 16     Temp 08/25/16 1641 99 F (37.2 C)     Temp Source 08/25/16 1641 Oral     SpO2 08/25/16 1641 98 %     Weight 08/25/16 1634 155 lb (70.3 kg)     Height 08/25/16 1634 5' (1.524 m)     Head Circumference --      Peak Flow --      Pain Score 08/25/16 1634 0     Pain Loc --      Pain Edu? --      Excl. in GC? --     Constitutional: Alert and oriented.  in no acute distress. Eyes: Conjunctivae are normal.  Head: Atraumatic. Nose: No congestion/rhinnorhea. Mouth/Throat: Mucous membranes are moist.  Neck: No stridor.   Cardiovascular: Normal rate, regular rhythm. Grossly normal heart sounds. Respiratory: Normal respiratory effort.  No retractions. Lungs CTAB. Gastrointestinal: Soft and nontender. No distention.  Musculoskeletal: No lower extremity tenderness nor edema.  No joint effusions. Neurologic:  Normal speech and language. No gross focal neurologic deficits are appreciated. Skin:  Skin is warm, dry and intact. No rash noted. Psychiatric: Mood and affect are normal. Speech and behavior are  normal.  ____________________________________________   LABS (all labs ordered are listed, but only abnormal results are displayed)  Labs Reviewed - No data to display ____________________________________________  EKG   ____________________________________________  RADIOLOGY   ____________________________________________   PROCEDURES  Procedure(s) performed:   Procedures  Critical Care performed:   ____________________________________________   INITIAL IMPRESSION / ASSESSMENT AND PLAN / ED COURSE  Pertinent labs & imaging results that were available during my care of the patient were reviewed by me and considered in my medical decision making (see chart for details).  Patient aware that she'll be placed under involuntary commitment. Psychiatry to see.      ____________________________________________   FINAL CLINICAL IMPRESSION(S) / ED DIAGNOSES  Suicidal and homicidal ideation.    NEW MEDICATIONS STARTED DURING THIS VISIT:  New Prescriptions   No medications on file     Note:  This document was prepared using Dragon voice recognition software and may include unintentional dictation errors.     Myrna Blazer, MD 08/25/16 1712  ED ECG REPORT I, Arelia Longest, the attending physician, personally viewed and interpreted this ECG.   Date: 08/25/2016  EKG Time: 2310  Rate: 83  Rhythm: normal sinus rhythm  Axis: Normal  Intervals:none  ST&T Change: No ST segment elevation or depression. Scooped T wave morphology in 3 and aVF. No significant change from the EKG of 12/22/2011    Myrna Blazer, MD 08/25/16 [phone]

## 2016-08-25 NOTE — ED Triage Notes (Signed)
Pt presents to with c/o homicidal/suicidal ideation with no plans, reports she worries tha her medications stopped working.

## 2016-08-26 ENCOUNTER — Inpatient Hospital Stay
Admission: AD | Admit: 2016-08-26 | Discharge: 2016-09-01 | DRG: 885 | Disposition: A | Discharge status: Home / Self Care | Payer: Medicare Other | Attending: Psychiatry | Admitting: Psychiatry

## 2016-08-26 DIAGNOSIS — Z91128 Patient's intentional underdosing of medication regimen for other reason: Secondary | ICD-10-CM | POA: Diagnosis not present

## 2016-08-26 DIAGNOSIS — F909 Attention-deficit hyperactivity disorder, unspecified type: Secondary | ICD-10-CM | POA: Diagnosis present

## 2016-08-26 DIAGNOSIS — I10 Essential (primary) hypertension: Secondary | ICD-10-CM | POA: Diagnosis present

## 2016-08-26 DIAGNOSIS — T43595A Adverse effect of other antipsychotics and neuroleptics, initial encounter: Secondary | ICD-10-CM | POA: Diagnosis not present

## 2016-08-26 DIAGNOSIS — E876 Hypokalemia: Secondary | ICD-10-CM | POA: Diagnosis present

## 2016-08-26 DIAGNOSIS — Z87891 Personal history of nicotine dependence: Secondary | ICD-10-CM | POA: Diagnosis not present

## 2016-08-26 DIAGNOSIS — M25559 Pain in unspecified hip: Secondary | ICD-10-CM | POA: Diagnosis present

## 2016-08-26 DIAGNOSIS — E785 Hyperlipidemia, unspecified: Secondary | ICD-10-CM | POA: Diagnosis present

## 2016-08-26 DIAGNOSIS — M5136 Other intervertebral disc degeneration, lumbar region: Secondary | ICD-10-CM | POA: Diagnosis present

## 2016-08-26 DIAGNOSIS — N393 Stress incontinence (female) (male): Secondary | ICD-10-CM | POA: Diagnosis present

## 2016-08-26 DIAGNOSIS — K219 Gastro-esophageal reflux disease without esophagitis: Secondary | ICD-10-CM | POA: Diagnosis present

## 2016-08-26 DIAGNOSIS — M5030 Other cervical disc degeneration, unspecified cervical region: Secondary | ICD-10-CM | POA: Diagnosis present

## 2016-08-26 DIAGNOSIS — G2571 Drug induced akathisia: Secondary | ICD-10-CM | POA: Diagnosis not present

## 2016-08-26 DIAGNOSIS — E034 Atrophy of thyroid (acquired): Secondary | ICD-10-CM | POA: Diagnosis present

## 2016-08-26 DIAGNOSIS — Z915 Personal history of self-harm: Secondary | ICD-10-CM

## 2016-08-26 DIAGNOSIS — T43596A Underdosing of other antipsychotics and neuroleptics, initial encounter: Secondary | ICD-10-CM | POA: Diagnosis present

## 2016-08-26 DIAGNOSIS — X58XXXA Exposure to other specified factors, initial encounter: Secondary | ICD-10-CM | POA: Diagnosis present

## 2016-08-26 DIAGNOSIS — F603 Borderline personality disorder: Secondary | ICD-10-CM | POA: Diagnosis present

## 2016-08-26 DIAGNOSIS — J449 Chronic obstructive pulmonary disease, unspecified: Secondary | ICD-10-CM | POA: Diagnosis present

## 2016-08-26 DIAGNOSIS — J302 Other seasonal allergic rhinitis: Secondary | ICD-10-CM | POA: Diagnosis present

## 2016-08-26 DIAGNOSIS — G8929 Other chronic pain: Secondary | ICD-10-CM | POA: Diagnosis present

## 2016-08-26 DIAGNOSIS — K5900 Constipation, unspecified: Secondary | ICD-10-CM | POA: Diagnosis present

## 2016-08-26 DIAGNOSIS — F3181 Bipolar II disorder: Secondary | ICD-10-CM | POA: Diagnosis present

## 2016-08-26 DIAGNOSIS — Z79899 Other long term (current) drug therapy: Secondary | ICD-10-CM

## 2016-08-26 DIAGNOSIS — F4310 Post-traumatic stress disorder, unspecified: Secondary | ICD-10-CM | POA: Diagnosis present

## 2016-08-26 LAB — LIPID PANEL
Cholesterol: 237 mg/dL — ABNORMAL HIGH (ref 0–200)
HDL: 38 mg/dL — ABNORMAL LOW (ref >40)
LDL Cholesterol: 125 mg/dL — ABNORMAL HIGH (ref 0–99)
Total CHOL/HDL Ratio: 6.2 RATIO
Triglycerides: 368 mg/dL — ABNORMAL HIGH (ref <150)
VLDL: 74 mg/dL — ABNORMAL HIGH (ref 0–40)

## 2016-08-26 LAB — VITAMIN B12: Vitamin B-12: 426 pg/mL (ref 180–914)

## 2016-08-26 LAB — HEMOGLOBIN A1C
Hgb A1c MFr Bld: 5.6 % (ref 4.8–5.6)
Mean Plasma Glucose: 114.02 mg/dL

## 2016-08-26 LAB — TSH: TSH: 0.844 u[IU]/mL (ref 0.350–4.500)

## 2016-08-26 MED ORDER — MAGNESIUM HYDROXIDE 400 MG/5ML PO SUSP: 30.0000 mL | Freq: Every day | ORAL | Status: DC | PRN

## 2016-08-26 MED ORDER — TIZANIDINE HCL 2 MG PO TABS
2.0000 mg | ORAL_TABLET | Freq: Three times a day (TID) | ORAL | Status: DC
  Administered 2016-08-26 – 2016-09-01 (×17): 2 mg via ORAL
  Filled 2016-08-26 (×20): qty 1

## 2016-08-26 MED ORDER — NORETHINDRONE 0.35 MG PO TABS: 1.0000 | ORAL_TABLET | Freq: Every day | ORAL | Status: DC

## 2016-08-26 MED ORDER — DICLOFENAC SODIUM 1 % TD GEL
2.0000 g | Freq: Three times a day (TID) | TRANSDERMAL | Status: DC
  Administered 2016-08-26 – 2016-09-01 (×17): 2 g via TOPICAL
  Filled 2016-08-26 (×2): qty 100

## 2016-08-26 MED ORDER — TRAMADOL HCL 50 MG PO TABS
50.0000 mg | ORAL_TABLET | Freq: Three times a day (TID) | ORAL | Status: DC
  Administered 2016-08-26 – 2016-09-01 (×18): 50 mg via ORAL
  Filled 2016-08-26 (×18): qty 1

## 2016-08-26 MED ORDER — METHYLPHENIDATE HCL 10 MG PO TABS: 10.0000 mg | ORAL_TABLET | Freq: Three times a day (TID) | ORAL | Status: DC

## 2016-08-26 MED ORDER — OXYCODONE HCL 5 MG PO TABS: 5.0000 mg | ORAL_TABLET | Freq: Two times a day (BID) | ORAL | Status: DC | PRN

## 2016-08-26 MED ORDER — VENLAFAXINE HCL 37.5 MG PO TABS: 37.5000 mg | ORAL_TABLET | Freq: Every day | ORAL | Status: DC

## 2016-08-26 MED ORDER — BUPROPION HCL ER (XL) 150 MG PO TB24
150.0000 mg | ORAL_TABLET | Freq: Every day | ORAL | Status: DC
  Administered 2016-08-26 – 2016-09-01 (×7): 150 mg via ORAL
  Filled 2016-08-26 (×7): qty 1

## 2016-08-26 MED ORDER — ACETAMINOPHEN 325 MG PO TABS
650.0000 mg | ORAL_TABLET | Freq: Four times a day (QID) | ORAL | Status: DC | PRN
  Administered 2016-08-29 – 2016-08-31 (×4): 650 mg via ORAL
  Filled 2016-08-26 (×4): qty 2

## 2016-08-26 MED ORDER — VENLAFAXINE HCL ER 75 MG PO CP24
150.0000 mg | ORAL_CAPSULE | Freq: Every day | ORAL | Status: DC
  Administered 2016-08-27 – 2016-08-28 (×2): 150 mg via ORAL
  Filled 2016-08-26 (×2): qty 2

## 2016-08-26 MED ORDER — POLYETHYLENE GLYCOL 3350 17 G PO PACK
17.0000 g | PACK | Freq: Every day | ORAL | Status: DC
  Administered 2016-08-26 – 2016-09-01 (×7): 17 g via ORAL
  Filled 2016-08-26 (×7): qty 1

## 2016-08-26 MED ORDER — NALOXEGOL OXALATE 25 MG PO TABS
25.0000 mg | ORAL_TABLET | Freq: Every day | ORAL | Status: DC
  Administered 2016-08-26 – 2016-09-01 (×7): 25 mg via ORAL
  Filled 2016-08-26 (×8): qty 1

## 2016-08-26 MED ORDER — ALUM & MAG HYDROXIDE-SIMETH 200-200-20 MG/5ML PO SUSP: 30.0000 mL | ORAL | Status: DC | PRN

## 2016-08-26 MED ORDER — ALBUTEROL SULFATE HFA 108 (90 BASE) MCG/ACT IN AERS
2.0000 | INHALATION_SPRAY | RESPIRATORY_TRACT | Status: DC | PRN
  Administered 2016-08-31: 2 via RESPIRATORY_TRACT

## 2016-08-26 MED ORDER — FLUTICASONE PROPIONATE 50 MCG/ACT NA SUSP
2.0000 | Freq: Every day | NASAL | Status: DC
  Administered 2016-08-26 – 2016-09-01 (×7): 2 via NASAL
  Filled 2016-08-26 (×2): qty 16

## 2016-08-26 MED ORDER — FAMOTIDINE 20 MG PO TABS
20.0000 mg | ORAL_TABLET | Freq: Every day | ORAL | Status: DC
  Administered 2016-08-26 – 2016-08-31 (×7): 20 mg via ORAL
  Filled 2016-08-26 (×9): qty 1

## 2016-08-26 MED ORDER — OXYCODONE HCL 5 MG PO TABS
2.5000 mg | ORAL_TABLET | Freq: Three times a day (TID) | ORAL | Status: DC
  Administered 2016-08-26 – 2016-08-28 (×6): 2.5 mg via ORAL
  Filled 2016-08-26 (×6): qty 1

## 2016-08-26 MED ORDER — TIOTROPIUM BROMIDE MONOHYDRATE 18 MCG IN CAPS
18.0000 ug | ORAL_CAPSULE | Freq: Every day | RESPIRATORY_TRACT | Status: DC
  Administered 2016-08-27 – 2016-09-01 (×6): 18 ug via RESPIRATORY_TRACT
  Filled 2016-08-26: qty 5

## 2016-08-26 MED ORDER — LURASIDONE HCL 40 MG PO TABS
40.0000 mg | ORAL_TABLET | Freq: Every day | ORAL | Status: DC
  Administered 2016-08-26 – 2016-08-28 (×3): 40 mg via ORAL
  Filled 2016-08-26 (×3): qty 1

## 2016-08-26 MED ORDER — MOMETASONE FURO-FORMOTEROL FUM 200-5 MCG/ACT IN AERO
2.0000 | INHALATION_SPRAY | Freq: Two times a day (BID) | RESPIRATORY_TRACT | Status: DC
  Administered 2016-08-26 – 2016-09-01 (×13): 2 via RESPIRATORY_TRACT
  Filled 2016-08-26 (×2): qty 8.8

## 2016-08-26 MED ORDER — POTASSIUM CHLORIDE CRYS ER 20 MEQ PO TBCR
40.0000 meq | EXTENDED_RELEASE_TABLET | Freq: Two times a day (BID) | ORAL | Status: AC
  Administered 2016-08-26 – 2016-08-27 (×4): 40 meq via ORAL
  Filled 2016-08-26 (×4): qty 2

## 2016-08-26 MED ORDER — POLYETHYLENE GLYCOL 3350 17 GM/SCOOP PO POWD: 1.0000 | Freq: Every day | ORAL | Status: DC

## 2016-08-26 MED ORDER — LAMOTRIGINE 100 MG PO TABS: 200.0000 mg | ORAL_TABLET | Freq: Every day | ORAL | Status: DC

## 2016-08-26 MED ORDER — FESOTERODINE FUMARATE ER 4 MG PO TB24
4.0000 mg | ORAL_TABLET | Freq: Every day | ORAL | Status: DC
  Administered 2016-08-26 – 2016-09-01 (×7): 4 mg via ORAL
  Filled 2016-08-26 (×8): qty 1

## 2016-08-26 MED ORDER — LEVOTHYROXINE SODIUM 50 MCG PO TABS
50.0000 ug | ORAL_TABLET | Freq: Every day | ORAL | Status: DC
  Administered 2016-08-27 – 2016-09-01 (×6): 50 ug via ORAL
  Filled 2016-08-26 (×6): qty 1

## 2016-08-26 MED ORDER — TRAZODONE HCL 100 MG PO TABS: 200.0000 mg | ORAL_TABLET | Freq: Every day | ORAL | Status: DC
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
## 2016-08-26 NOTE — Plan of Care (Signed)
Problem: Education: Goal: Knowledge of [employer] [facility] Education information/materials will improve Outcome: Progressing  Instructed on unit programing verbalize understanding  Goal: Mental status will improve Outcome: Progressing Patient able to contract for safety Goal: Verbalization of understanding the information provided will improve Outcome: Progressing Verbalizing understanding of information received  Problem: Coping: Goal: Ability to cope will improve Outcome: Not Progressing New Admission no programing  As yet  Goal: Ability to verbalize feelings will improve Outcome: Progressing Patient able to  Verbalize feeling   Problem: Education: Goal: Ability to make informed decisions regarding treatment will improve Outcome: Not Progressing New Admission  No programing  As yet

## 2016-08-26 NOTE — ED Notes (Signed)

## 2016-08-26 NOTE — ED Notes (Signed)
Patient observed lying in bed with eyes closed  Even, unlabored respirations observed   NAD pt appears to be sleeping - blanket is covering her head    I will continue to monitor along with every 15 minute visual observation

## 2016-08-26 NOTE — Tx Team (Signed)
Initial Treatment Plan 08/26/2016 12:19 PM Kirsten Richardson ZOX:[id number]    PATIENT STRESSORS: Financial difficulties Health problems Marital or family conflict   PATIENT STRENGTHS: Ability for insight Active sense of humor Average or above average intelligence Capable of independent living Communication skills   PATIENT IDENTIFIED PROBLEMS: Depression 08/26/16  Suicidal 08/26/16  Chronic Pain 08/26/16                 DISCHARGE CRITERIA:  Ability to meet basic life and health needs Improved stabilization in mood, thinking, and/or behavior Medical problems require only outpatient monitoring  PRELIMINARY DISCHARGE PLAN: Outpatient therapy Return to previous living arrangement  PATIENT/FAMILY INVOLVEMENT: This treatment plan has been presented to and reviewed with the patient, Kirsten Richardson, and/or family member,   The patient and family have been given the opportunity to ask questions and make suggestions.  Crist Infante, RN 08/26/2016, 12:19 PM

## 2016-08-26 NOTE — ED Notes (Signed)
ED  Is the patient under IVC or is there intent for IVC: Yes.   Is the patient medically cleared: Yes.   Is there vacancy in the ED BHU: Yes.   Is the population mix appropriate for patient: Yes.   Is the patient awaiting placement in inpatient or outpatient setting: Yes.   inpt admission Has the patient had a psychiatric consult: Yes.   Survey of unit performed for contraband, proper placement and condition of furniture, tampering with fixtures in bathroom, shower, and each patient room: Yes.  ; Findings:  APPEARANCE/BEHAVIOR Calm and cooperative NEURO ASSESSMENT Orientation: oriented x3  Hallucinations: No.None noted (Hallucinations) Speech: Normal Gait: normal RESPIRATORY ASSESSMENT Even  Unlabored respirations  CARDIOVASCULAR ASSESSMENT Pulses equal   regular rate  Skin warm and dry   GASTROINTESTINAL ASSESSMENT no GI complaint EXTREMITIES Full ROM  PLAN OF CARE Provide calm/safe environment. Vital signs assessed twice daily. ED BHU Assessment once each 12-hour shift. Collaborate with TTS when available or as condition indicates. Assure the ED provider has rounded once each shift. Provide and encourage hygiene. Provide redirection as needed. Assess for escalating behavior; address immediately and inform ED provider.  Assess family dynamic and appropriateness for visitation as needed: Yes.  ; If necessary, describe findings:  Educate the patient/family about BHU procedures/visitation: Yes.  ; If necessary, describe findings:

## 2016-08-26 NOTE — Progress Notes (Signed)
Recreation Therapy Notes  INPATIENT RECREATION THERAPY ASSESSMENT  Patient Details Name: Kirsten Richardson MRN: [id number] DOB: [birth year] Today's Date: 08/26/2016  Patient Stressors: Family, Death, Other (Comment) Pt reports she does not have a good relationship with her family; best friend died less than 3 weeks ago; pain has increase in the past couple months - doctor who did procedures has stopped and referred to her to someone in [place] - pt does not have finances for gas to go to [place]; health issues  Coping Skills:   Isolate, Avoidance, Art/Dance, Music, Other (Comment) (Read Bibe)  Personal Challenges: Anger, Communication, Concentration, Decision-Making, Expressing Yourself, Problem-Solving, Relationships, Self-Esteem/Confidence, Social Interaction, Stress Management, Time Management, Trusting Others  Leisure Interests (2+):  Individual - Other (Comment) (Read the Bible, attend church)  Awareness of Community Resources:  No  Community Resources:     Current Use:    If no, Barriers?:    Patient Strengths:  Helpful, nice  Patient Identified Areas of Improvement:  Self-esteem, boundaries for bad relationships  Current Recreation Participation:  Reading Bible  Patient Goal for Hospitalization:  To get herself back to a point where she can manage her pain and stress on her own  [place] of Residence:  [place] of Residence:  [place]   Current [place] (including self-harm):  Yes  Current HI:  Yes ("There's thoughts, but no plan.")  Consent to Intern Participation: N/A   Jacquelynn Cree, LRT/CTRS 08/26/2016, 4:02 PM

## 2016-08-26 NOTE — BH Assessment (Signed)
Patient is to be admitted to [facility] by Dr. Toni Amend.  Attending Physician will be Dr. Ardyth Harps.   Patient has been assigned to room 306, by [facility] Charge Nurse [place] EDP Dr.Williams, Secretary Misty Stanley, Pt RN Amy, and pt access Vikki Ports have been informed.
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
## 2016-08-26 NOTE — ED Notes (Signed)
Pt refusing to have CBG checked
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
## 2016-08-26 NOTE — BHH Suicide Risk Assessment (Signed)
[facility] Admission Suicide Risk Assessment   Nursing information obtained from:    Demographic factors:    Current Mental Status:    Loss Factors:    Historical Factors:    Risk Reduction Factors:     Total Time spent with patient: 1 hour Principal Problem: Bipolar 2 disorder, major depressive episode (HCC) Diagnosis:   Patient Active Problem List   Diagnosis Date Noted  . COPD (chronic obstructive pulmonary disease) (HCC) [J44.9] 08/26/2016  . Constipation [K59.00] 08/26/2016  . Bipolar 2 disorder, major depressive episode (HCC) [F31.81] 08/25/2016  . Essential hypertension [I10] 08/31/2015  . DDD (degenerative disc disease), lumbar [M51.36] 08/02/2015  . DDD (degenerative disc disease), cervical [M50.30] 08/02/2015  . 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 Hip pain, chronic [M25.559, G89.29] 09/27/2014  . Borderline personality disorder [F60.3] 09/27/2014  . SUI (stress urinary incontinence, female) [N39.3] 01/02/2014   Subjective Data:   Continued Clinical Symptoms:  Alcohol Use Disorder Identification Test Final Score (AUDIT): 0 The "Alcohol Use Disorders Identification Test", Guidelines for Use in Primary Care, Second Edition.  World Science writer [facility]). Score between 0-7:  no or low risk or alcohol related problems. Score between 8-15:  moderate risk of alcohol related problems. Score between 16-19:  high risk of alcohol related problems. Score 20 or above:  warrants further diagnostic evaluation for alcohol dependence and treatment.   CLINICAL FACTORS:   Bipolar Disorder:   Depressive phase Personality Disorders:   Cluster B Comorbid depression Chronic Pain More than one psychiatric diagnosis Previous Psychiatric Diagnoses and Treatments    Psychiatric Specialty Exam: Physical Exam  ROS  Blood pressure 108/66, pulse 98, temperature 99.2 F (37.3 C), temperature source Oral, resp. rate 18, height 5\' 4"  (1.626 m), weight 68.5  kg (151 lb), last menstrual period 08/23/2016, SpO2 99 %.Body mass index is 25.92 kg/m.                                                    Sleep:         COGNITIVE FEATURES THAT CONTRIBUTE TO RISK:  Closed-mindedness    SUICIDE RISK:   Moderate:  Frequent suicidal ideation with limited intensity, and duration, some specificity in terms of plans, no associated intent, good self-control, limited dysphoria/symptomatology, some risk factors present, and identifiable protective factors, including available and accessible social support.  PLAN OF CARE: admit  I certify that inpatient services furnished can reasonably be expected to improve the patient's condition.   08/25/2016, MD 08/26/2016, 12:03 PM

## 2016-08-26 NOTE — Progress Notes (Signed)
Recreation Therapy Notes  Date: 08.21.18 Time: 3:00 pm Location: Craft Room  Group Topic: Self-expression  Goal Area(s) Addresses:  Patient will be able to identify one color that represents each emotion. Patient will verbalize benefit of using art as a means of self-expression. Patient will verbalize one emotion experienced while participating in activity.  Behavioral Response: Attentive, Interactive  Intervention: The Colors Within Me  Activity: Patients were given blank face worksheets and were instructed to pick a color for each emotion they were feeling and show on the worksheet how much of that emotion they were feeling.  Education: LRT educated patients on other forms of self-expression.  Education Outcome: Acknowledges education/In group clarification offered  Clinical Observations/Feedback: Patient picked a color for each emotion and showed on the worksheet how much of that emotion she was feeling. Patient contributed to group discussion by stating what emotions she was feeling, that her emotions are dynamic, that it was helpful to see her emotions on paper and why, and what emotions she felt in group.  Jacquelynn Cree, LRT/CTRS 08/26/2016 3:55 PM

## 2016-08-27 LAB — HEPATITIS C ANTIBODY: HCV Ab: <0.1 s/co ratio (ref 0.0–0.9)

## 2016-08-27 MED ORDER — NORETHINDRONE 0.35 MG PO TABS: 1.0000 | ORAL_TABLET | Freq: Every day | ORAL | Status: DC

## 2016-08-27 MED ORDER — NICOTINE 21 MG/24HR TD PT24
21.0000 mg | MEDICATED_PATCH | Freq: Every day | TRANSDERMAL | Status: DC
  Administered 2016-08-27 – 2016-09-01 (×6): 21 mg via TRANSDERMAL
  Filled 2016-08-27 (×6): qty 1

## 2016-08-27 NOTE — Progress Notes (Signed)
Recreation Therapy Notes  Date: 08.22.18 Time: 1:00 pm Location: Craft Room  Group Topic: Self-esteem  Goal Area(s) Addresses:  Patient will write at least one positive trait about self. Patient will verbalize benefit of having a healthy self-esteem.  Behavioral Response: Attentive, Interactive  Intervention: I Am  Activity: Patients were given worksheets with the letter I on them and were instructed to write as many positive traits about themselves inside the letter.  Education: LRT educated patients on ways to increase their self-esteem.  Education Outcome: Acknowledges education/In group clarification offered   Clinical Observations/Feedback: Patient wrote positive traits. Patient contributed to group discussion by stating it was easy to think of positive traits, what makes it difficult to think of positive traits at times, how her self-esteem affects her, and how she can increase her self-esteem.  Jacquelynn Cree, LRT/CTRS 08/27/2016 1:52 PM

## 2016-08-27 NOTE — Tx Team (Signed)
Interdisciplinary Treatment and Diagnostic Plan Update  08/27/2016 Time of Session: 11:00 AM Kirsten Richardson MRN: [id number]  Principal Diagnosis: Bipolar 2 disorder, major depressive episode (HCC)  Secondary Diagnoses: Principal Problem:   Bipolar 2 disorder, major depressive episode (HCC) Active Problems:   Hip pain, chronic   Borderline personality disorder   DDD (degenerative disc disease), lumbar   DDD (degenerative disc disease), cervical   SUI (stress urinary incontinence, female)   Hypothyroidism due to acquired atrophy of thyroid   Essential hypertension   COPD (chronic obstructive pulmonary disease) (HCC)   Constipation   Current Medications:  Current Facility-Administered Medications  Medication Dose Route Frequency Provider Last Rate Last Dose  . acetaminophen (TYLENOL) tablet 650 mg  650 mg Oral Q6H PRN Clapacs, John T, MD      . albuterol (PROVENTIL HFA;VENTOLIN HFA) 108 (90 Base) MCG/ACT inhaler 2 puff  2 puff Inhalation Q4H PRN Clapacs, John T, MD      . alum & mag hydroxide-simeth (MAALOX/MYLANTA) 200-200-20 MG/5ML suspension 30 mL  30 mL Oral Q4H PRN Clapacs, John T, MD      . buPROPion (WELLBUTRIN XL) 24 hr tablet 150 mg  150 mg Oral Daily Jimmy Footman, MD   150 mg at 08/27/16 0751  . diclofenac sodium (VOLTAREN) 1 % transdermal gel 2 g  2 g Topical TID Jimmy Footman, MD   2 g at 08/27/16 0748  . famotidine (PEPCID) tablet 20 mg  20 mg Oral Daily Clapacs, Jackquline Denmark, MD   20 mg at 08/26/16 2026  . fesoterodine (TOVIAZ) tablet 4 mg  4 mg Oral Daily Jimmy Footman, MD   4 mg at 08/27/16 0752  . fluticasone (FLONASE) 50 MCG/ACT nasal spray 2 spray  2 spray Each Nare Daily Jimmy Footman, MD   2 spray at 08/27/16 0750  . levothyroxine (SYNTHROID, LEVOTHROID) tablet 50 mcg  50 mcg Oral QAC breakfast Clapacs, Jackquline Denmark, MD   50 mcg at 08/27/16 [phone]  . lurasidone (LATUDA) tablet 40 mg  40 mg Oral Q supper Jimmy Footman, MD   40 mg at 08/26/16 1715  . magnesium hydroxide (MILK OF MAGNESIA) suspension 30 mL  30 mL Oral Daily PRN Clapacs, John T, MD      . mometasone-formoterol (DULERA) 200-5 MCG/ACT inhaler 2 puff  2 puff Inhalation BID Jimmy Footman, MD   2 puff at 08/27/16 0751  . naloxegol oxalate (MOVANTIK) tablet 25 mg  25 mg Oral Daily Jimmy Footman, MD   25 mg at 08/27/16 0752  . nicotine (NICODERM CQ - dosed in mg/24 hours) patch 21 mg  21 mg Transdermal Daily Jimmy Footman, MD   21 mg at 08/27/16 0926  . norethindrone (MICRONOR,CAMILA,ERRIN) 0.35 MG tablet 0.35 mg  1 tablet Oral Daily Jimmy Footman, MD      . oxyCODONE (Oxy IR/ROXICODONE) immediate release tablet 2.5 mg  2.5 mg Oral TID Jimmy Footman, MD   2.5 mg at 08/27/16 0751  . polyethylene glycol (MIRALAX / GLYCOLAX) packet 17 g  17 g Oral Daily Jimmy Footman, MD   17 g at 08/27/16 0748  . potassium chloride SA (K-DUR,KLOR-CON) CR tablet 40 mEq  40 mEq Oral BID Jimmy Footman, MD   40 mEq at 08/27/16 0752  . tiotropium (SPIRIVA) inhalation capsule 18 mcg  18 mcg Inhalation Daily Clapacs, Jackquline Denmark, MD   18 mcg at 08/27/16 0749  . tiZANidine (ZANAFLEX) tablet 2 mg  2 mg Oral TID Jimmy Footman, MD   2 mg  at 08/27/16 0752  . traMADol (ULTRAM) tablet 50 mg  50 mg Oral TID Clapacs, Jackquline Denmark, MD   50 mg at 08/27/16 1324  . venlafaxine XR (EFFEXOR-XR) 24 hr capsule 150 mg  150 mg Oral Q breakfast Jimmy Footman, MD   150 mg at 08/27/16 0751   PTA Medications: Facility-Administered Medications Prior to Admission  Medication Dose Route Frequency Provider Last Rate Last Dose  . bupivacaine (PF) (MARCAINE) 0.25 % injection 20 mL  20 mL Infiltration Once Tod Persia, MD      . ceFAZolin (ANCEF) IVPB 1 g/50 mL premix  1 g Intravenous Once Ewing Schlein, MD      . ceFAZolin (ANCEF) IVPB 1 g/50 mL premix  1 g Intravenous Once Ewing Schlein, MD       . fentaNYL (SUBLIMAZE) injection 50 mcg  50 mcg Intravenous Alona Bene, MD      . fentaNYL (SUBLIMAZE) injection 50 mcg  50 mcg Intravenous Alona Bene, MD      . iohexol (OMNIPAQUE) 180 MG/ML injection 20 mL  20 mL Intravenous PRN Tod Persia, MD      . iopamidol (ISOVUE-M) 41 % intrathecal injection 20 mL  20 mL Epidural PRN Tod Persia, MD      . lactated ringers infusion 1,000 mL  1,000 mL Intravenous Continuous Ewing Schlein, MD      . lactated ringers infusion 1,000 mL  1,000 mL Intravenous Continuous Ewing Schlein, MD 125 mL/hr at 08/29/15 1004 1,000 mL at 08/29/15 1004  . lactated ringers infusion 1,000 mL  1,000 mL Intravenous Continuous Ewing Schlein, MD      . lidocaine (PF) (XYLOCAINE) 1 % injection 10 mL  10 mL Subcutaneous Once Ewing Schlein, MD      . midazolam (VERSED) 5 MG/5ML injection 1 mg  1 mg Intravenous Alona Bene, MD      . midazolam (VERSED) 5 MG/5ML injection 1 mg  1 mg Intravenous Alona Bene, MD      . orphenadrine (NORFLEX) injection 60 mg  60 mg Intramuscular Once Ewing Schlein, MD      . triamcinolone acetonide (KENALOG-40) injection 80 mg  80 mg Intramuscular Once Tod Persia, MD       Prescriptions Prior to Admission  Medication Sig Dispense Refill Last Dose  . albuterol (PROVENTIL HFA;VENTOLIN HFA) 108 (90 Base) MCG/ACT inhaler Inhale into the lungs.     . budesonide-formoterol (SYMBICORT) 160-4.5 MCG/ACT inhaler INHALE 2 PUFFS BY MOUTH TWICE DAILY     . diclofenac sodium (VOLTAREN) 1 % GEL Apply topically.     . dicyclomine (BENTYL) 10 MG capsule Take by mouth.     . famotidine (PEPCID) 20 MG tablet Take by mouth.     . fluticasone (FLONASE) 50 MCG/ACT nasal spray SHAKE LIQUID AND USE 2 SPRAYS IN EACH NOSTRIL EVERY DAY AS NEEDED FOR RHINITIS OR ALLERGIES     . hydrochlorothiazide (HYDRODIURIL) 25 MG tablet Take by mouth.     . levothyroxine (SYNTHROID, LEVOTHROID) 50 MCG tablet TAKE 1 TABLET(50 MCG) BY MOUTH  EVERY DAY 30 TO 60 MINUTES BEFORE BREAKFAST ON AN EMPTY STOMACH AND WITH A GLASS OF WATER     . naloxegol oxalate (MOVANTIK) 25 MG TABS tablet Take by mouth.     . norethindrone (MICRONOR,CAMILA,ERRIN) 0.35 MG tablet Take by mouth.     . polyethylene glycol powder (GLYCOLAX/MIRALAX) powder Take by mouth.     . tolterodine (DETROL LA) 2 MG 24 hr capsule TAKE ONE  CAPSULE BY MOUTH ONCE DAILY     . umeclidinium bromide (INCRUSE ELLIPTA) 62.5 MCG/INH AEPB Inhale into the lungs.     Marland Kitchen albuterol (PROVENTIL) (2.5 MG/3ML) 0.083% nebulizer solution Take 2.5 mg by nebulization as needed for wheezing or shortness of breath.   Taking  . ARIPiprazole (ABILIFY) 20 MG tablet Take 20 mg by mouth daily.   Taking  . benztropine (COGENTIN) 2 MG tablet Take 2 mg by mouth 2 (two) times daily.   Taking  . budesonide-formoterol (SYMBICORT) 160-4.5 MCG/ACT inhaler Inhale 2 puffs into the lungs 2 (two) times daily.   Taking  . cefUROXime (CEFTIN) 250 MG tablet Take 1 tablet (250 mg total) by mouth 2 (two) times daily with a meal. (Patient not taking: Reported on 08/30/2015) 14 tablet 0 Not Taking  . cefUROXime (CEFTIN) 250 MG tablet Take 1 tablet (250 mg total) by mouth 2 (two) times daily with a meal. (Patient not taking: Reported on 09/05/2015) 14 tablet 0 Not Taking  . cefUROXime (CEFTIN) 250 MG tablet Take 1 tablet (250 mg total) by mouth 2 (two) times daily with a meal. 14 tablet 0   . desogestrel-ethinyl estradiol (APRI,EMOQUETTE,SOLIA) 0.15-30 MG-MCG tablet Take 1 tablet by mouth daily.   Taking  . diclofenac (FLECTOR) 1.3 % PTCH Place 1 patch onto the skin every morning.   Taking  . famotidine (PEPCID) 20 MG tablet Take 20 mg by mouth 2 (two) times daily.   Taking  . fluticasone (FLONASE) 50 MCG/ACT nasal spray Place into both nostrils daily.   Taking  . lamoTRIgine (LAMICTAL) 200 MG tablet Take 200 mg by mouth daily.   Taking  . Levothyroxine Sodium 50 MCG CAPS Take 50 mcg by mouth every morning.   Taking  .  Lido-Capsaicin-Men-Methyl Sal (MEDI-PATCH-LIDOCAINE) 0.5-0.035-5-20 % PTCH Apply topically.   Taking  . methylphenidate (RITALIN) 10 MG tablet Take 10 mg by mouth 4 (four) times daily.   Taking  . oxycodone (OXY-IR) 5 MG capsule Take 5 mg by mouth every morning.   Not Taking  . oxyCODONE-acetaminophen (ROXICET) 5-325 MG tablet Limit one half to one tablet by mouth per day for breakthrough pain while taking tramadol if tolerated 30 tablet 0 Taking  . tiotropium (SPIRIVA) 18 MCG inhalation capsule Place 18 mcg into inhaler and inhale daily.   Taking  . tiZANidine (ZANAFLEX) 2 MG tablet LIMIT TO 2 TO 3 TS PO PER DAY IF TOLERATED  0   . tolterodine (DETROL) 2 MG tablet Take 2 mg by mouth every morning.   Taking  . traMADol (ULTRAM) 50 MG tablet Take 1 tablet (50 mg total) by mouth 3 (three) times daily. (Patient not taking: Reported on 08/30/2015) 90 tablet 0 Not Taking  . traMADol (ULTRAM) 50 MG tablet Limit 1 tablet by mouth per day or 2-3 times per day if tolerated 90 tablet 0 Taking  . traZODone (DESYREL) 100 MG tablet Take 200 mg by mouth at bedtime.   Taking  . varenicline (CHANTIX) 1 MG tablet Take 1 mg by mouth 2 (two) times daily.   Not Taking  . venlafaxine (EFFEXOR) 37.5 MG tablet Take 37.5 mg by mouth daily.   Taking  . venlafaxine XR (EFFEXOR-XR) 150 MG 24 hr capsule Take 300 mg by mouth daily with breakfast.    Taking    Patient Stressors: Financial difficulties Health problems Marital or family conflict  Patient Strengths: Ability for insight Active sense of humor Average or above average intelligence Capable of independent living Communication skills  Treatment Modalities: Medication Management, Group therapy, Case management,  1 to 1 session with clinician, Psychoeducation, Recreational therapy.   Physician Treatment Plan for Primary Diagnosis: Bipolar 2 disorder, major depressive episode (HCC) Long Term Goal(s): Improvement in symptoms so as ready for discharge Improvement  in symptoms so as ready for discharge   Short Term Goals: Ability to identify changes in lifestyle to reduce recurrence of condition will improve Ability to verbalize feelings will improve Ability to disclose and discuss suicidal ideas Ability to demonstrate self-control will improve Ability to identify and develop effective coping behaviors will improve Ability to identify triggers associated with substance abuse/mental health issues will improve  Medication Management: Evaluate patient's response, side effects, and tolerance of medication regimen.  Therapeutic Interventions: 1 to 1 sessions, Unit Group sessions and Medication administration.  Evaluation of Outcomes: Progressing  Physician Treatment Plan for Secondary Diagnosis: Principal Problem:   Bipolar 2 disorder, major depressive episode (HCC) Active Problems:   Hip pain, chronic   Borderline personality disorder   DDD (degenerative disc disease), lumbar   DDD (degenerative disc disease), cervical   SUI (stress urinary incontinence, female)   Hypothyroidism due to acquired atrophy of thyroid   Essential hypertension   COPD (chronic obstructive pulmonary disease) (HCC)   Constipation  Long Term Goal(s): Improvement in symptoms so as ready for discharge Improvement in symptoms so as ready for discharge   Short Term Goals: Ability to identify changes in lifestyle to reduce recurrence of condition will improve Ability to verbalize feelings will improve Ability to disclose and discuss suicidal ideas Ability to demonstrate self-control will improve Ability to identify and develop effective coping behaviors will improve Ability to identify triggers associated with substance abuse/mental health issues will improve     Medication Management: Evaluate patient's response, side effects, and tolerance of medication regimen.  Therapeutic Interventions: 1 to 1 sessions, Unit Group sessions and Medication administration.  Evaluation  of Outcomes: Progressing   RN Treatment Plan for Primary Diagnosis: Bipolar 2 disorder, major depressive episode (HCC) Long Term Goal(s): Knowledge of disease and therapeutic regimen to maintain health will improve  Short Term Goals: Ability to demonstrate self-control, Ability to disclose and discuss suicidal ideas and Compliance with prescribed medications will improve  Medication Management: RN will administer medications as ordered by provider, will assess and evaluate patient's response and provide education to patient for prescribed medication. RN will report any adverse and/or side effects to prescribing provider.  Therapeutic Interventions: 1 on 1 counseling sessions, Psychoeducation, Medication administration, Evaluate responses to treatment, Monitor vital signs and CBGs as ordered, Perform/monitor CIWA, COWS, AIMS and Fall Risk screenings as ordered, Perform wound care treatments as ordered.  Evaluation of Outcomes: Progressing   LCSW Treatment Plan for Primary Diagnosis: Bipolar 2 disorder, major depressive episode (HCC) Long Term Goal(s): Safe transition to appropriate next level of care at discharge, Engage patient in therapeutic group addressing interpersonal concerns.  Short Term Goals: Engage patient in aftercare planning with referrals and resources, Increase social support and Facilitate acceptance of mental health diagnosis and concerns  Therapeutic Interventions: Assess for all discharge needs, 1 to 1 time with Social worker, Explore available resources and support systems, Assess for adequacy in community support network, Educate family and significant other(s) on suicide prevention, Complete Psychosocial Assessment, Interpersonal group therapy.  Evaluation of Outcomes: Progressing   Progress in Treatment: Attending groups: No. Participating in groups: No. Taking medication as prescribed: Yes. Toleration medication: Yes. Family/Significant other contact made: No,  will contact:  CSW  assessing appropriate contact. Patient understands diagnosis: Yes. Discussing patient identified problems/goals with staff: Yes. Medical problems stabilized or resolved: Yes. Denies suicidal/homicidal ideation: Yes. Issues/concerns per patient self-inventory: No.  New problem(s) identified: No, Describe:  None identified.  New Short Term/Long Term Goal(s): Patient stated that her goal is to feel better.  Discharge Plan or Barriers: Patient will discharge home and follow-up with [facility].  Reason for Continuation of Hospitalization: Depression Suicidal ideation  Estimated Length of Stay: 3-5 days  Attendees: Patient: Kirsten Richardson 08/27/2016 10:42 AM  Physician: Dr. Radene Journey, MD  08/27/2016 10:42 AM  Nursing: Leonia Reader, BSN, RN 08/27/2016 10:42 AM  RN Care Manager: 08/27/2016 10:42 AM  Social Worker: Hampton Abbot, MSW, LCSW-A 08/27/2016 10:42 AM  Recreational Therapist: Princella Ion, LRT, CTRS  08/27/2016 10:42 AM  Other:  08/27/2016 10:42 AM  Other:  08/27/2016 10:42 AM  Other: 08/27/2016 10:42 AM    Scribe for Treatment Team: Lynden Oxford, LCSWA 08/27/2016 10:42 AM

## 2016-08-27 NOTE — Plan of Care (Signed)
Problem: Activity: Goal: Sleeping patterns will improve Outcome: Progressing Patient slept for Estimated Hours of 8.15; Precautionary checks every 15 minutes for safety maintained, room free of safety hazards, patient sustains no injury or falls during this shift.     

## 2016-08-27 NOTE — Progress Notes (Signed)
Patient ID: Kirsten Richardson, female   DOB: June 26, 1975, 41 y.o.   MRN: [id number] Isolates to room except for medications, patient has spots all over body, "I have been picking them since I was younger .Marland Kitchen.." Rated her pain chronic, "nothing helps anymore.." Another 40 mEq K-DUR given for K+=2.8. Did not say much, pleasant, polite, denied SI/HI.

## 2016-08-27 NOTE — Plan of Care (Signed)
Problem: [facility] Participation in Recreation Therapeutic Interventions Goal: STG-Patient will demonstrate improved self esteem by identif STG: Self-Esteem - Within 4 treatment sessions, patient will verbalize at least 5 positive affirmation statements in each of 2 treatment sessions to increase self-esteem.  Outcome: Progressing Treatment Session 1; Completed 1 out of 2: At approximately 11:30 am, LRT met with patient in consultation room. Patient verbalized 5 positive affirmation statements. Patient reported it felt "kinda good". LRT encouraged patient to continue saying positive affirmation statements.  Leonette Monarch, LRT/CTRS 08.22.18 3:55 pm Goal: STG-Other Recreation Therapy Goal (Specify) STG: Stress Management - Within 4 treatment sessions, patient will verbalize understanding of the stress management techniques in each of 2 treatment sessions to increase stress management skills.  Outcome: Progressing Treatment Session 1; Completed 1 out of 2: At approximately 11:30 am, LRT met with patient in consultation room. LRT educated and provided patient with handouts on stress management techniques. Patient verbalized understanding. LRT encouraged patient to read over and practice the stress management techniques.  Leonette Monarch, LRT/CTRS 08.22.18 3:58 pm

## 2016-08-27 NOTE — Progress Notes (Signed)
[facility] MD Progress Note  08/27/2016 9:48 AM Kirsten Richardson  MRN:  [id number] Subjective:  41 year old Caucasian female with history of bipolar disorder, borderline personality and chronic pain presented voluntarily to our emergency department on August 20 complaining of suicidal ideation and thoughts of hurting people.  Patient reported not having any specific target as far as school she will hurt. She reported having thoughts of slitting her throat. Patient has had a multitude of suicidal attempts by overdose before with the last one being more than 10 years ago.  Patient follows up with [facility] where she sees Dr. Marguerite Olea and she also follows up with pain management Dr. Metta Clines in [place].  Patient reports that she has been compliant with all her medications. She complains that they are not working anymore. She feels that the Lamictal is making her feel numb. She was prescribed with Abilify too but due to side effects she discontinue this medication.  Patient says that Dr. Marguerite Olea has also been treated her with ADHD patients that she's been taking Ritalin 10 mg 4 times a day  Patient feels very depressed, and she has no energy and feels like sleeping all day. She has no desire to engaging in any pleasurable activities. She states her home alone all the time.  8/22 she still rates her depression as a 9 out of 10 x 10 being the worst. Says that she is not having suicidal thoughts as often as she was when she first came in but she still has to pay she denies having any homicidal thoughts. She feels less angry and agitated. She feels very tired, her energy is very low. She slept very well last night. Pain is slightly better than yesterday  Per nursing: Patient slept for Estimated Hours of 8.15; Precautionary checks every 15 minutes for safety maintained, room free of safety hazards, patient sustains no injury or falls during this shift.  Isolates to room except for medications, patient has spots all  over body, "I have been picking them since I was younger .Marland Kitchen.." Rated her pain chronic, "nothing helps anymore.." Another 40 mEq K-DUR given for K+=2.8. Did not say much, pleasant, polite, denied SI/HI. Principal Problem: Bipolar 2 disorder, major depressive episode (HCC) Diagnosis:   Patient Active Problem List   Diagnosis Date Noted  . COPD (chronic obstructive pulmonary disease) (HCC) [J44.9] 08/26/2016  . Constipation [K59.00] 08/26/2016  . Bipolar 2 disorder, major depressive episode (HCC) [F31.81] 08/25/2016  . Essential hypertension [I10] 08/31/2015  . DDD (degenerative disc disease), lumbar [M51.36] 08/02/2015  . DDD (degenerative disc disease), cervical [M50.30] 08/02/2015  . 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 Hip pain, chronic [M25.559, G89.29] 09/27/2014  . Borderline personality disorder [F60.3] 09/27/2014  . SUI (stress urinary incontinence, female) [N39.3] 01/02/2014   Total Time spent with patient: 30 minutes  Past Psychiatric History: Patient states she is being hospitalized multiple times per she's been diagnosed with bipolar disorder, ADHD, borderline personality disorder. She has had multiple suicidal attempts by overdose, at least 4 prior suicidal attempts. The last one was in 2010.  Currently on Effexor 325 mg a day, Ritalin 10 mg 4 times a day, trazodone 200 mg daily at bedtime, Lamictal 200 mg.  Past Medical History:  Past Medical History:  Diagnosis Date  . Anemia   . Arthritis   . Degenerative disc disease, lumbar   . Depression   . Emphysema of lung (HCC)   . GERD (gastroesophageal reflux disease)   .  Hypertension   . Plantar fasciitis   . Thyroid disease     Past Surgical History:  Procedure Laterality Date  . FOOT SURGERY Right    plantar fasciatis  . HIP FRACTURE SURGERY Bilateral    Family History:  Family History  Problem Relation Age of Onset  . Arthritis Mother   . COPD Mother   . Cancer Mother   .  Depression Mother   . Early death Mother   . Vision loss Mother   . Mental illness Mother   . Alcohol abuse Father   . Arthritis Father   . Cancer Father   . Diabetes Father   . Drug abuse Father   . Early death Father   . Vision loss Father   . Heart disease Father   . Hypertension Father   . Mental illness Father   . Stroke Father    Family Psychiatric  History: Reports that her sister and mother were diagnosed with depression. Her son was diagnosed with ADHD. She has nieces who suffer from mental illness but she doesn't know the diagnosis. Patient's father was an alcoholic  Social History: Patient is divorced. She lives by herself. She is disabled due to her medical issues. She has a GED education. She denies any history of legal problems in the past. She has 2 children and she helped raise her niece and nephew after her sister went to prison for killing  Husband.  Patient's sister is currently staying with the patient temporarily History  Alcohol Use No     History  Drug Use No    Social History   Social History  . Marital status: Single    Spouse name: N/A  . Number of children: N/A  . Years of education: N/A   Social History Main Topics  . Smoking status: Former Smoker    Packs/day: 0.20    Types: Cigarettes    Quit date: 07/25/2015  . Smokeless tobacco: Never Used  . Alcohol use No  . Drug use: No  . Sexual activity: Yes    Birth control/ protection: Pill   Other Topics Concern  . Not on file   Social History Narrative  . No narrative on file     Current Medications: Current Facility-Administered Medications  Medication Dose Route Frequency Provider Last Rate Last Dose  . acetaminophen (TYLENOL) tablet 650 mg  650 mg Oral Q6H PRN Clapacs, John T, MD      . albuterol (PROVENTIL HFA;VENTOLIN HFA) 108 (90 Base) MCG/ACT inhaler 2 puff  2 puff Inhalation Q4H PRN Clapacs, John T, MD      . alum & mag hydroxide-simeth (MAALOX/MYLANTA) 200-200-20 MG/5ML  suspension 30 mL  30 mL Oral Q4H PRN Clapacs, John T, MD      . buPROPion (WELLBUTRIN XL) 24 hr tablet 150 mg  150 mg Oral Daily Jimmy Footman, MD   150 mg at 08/27/16 0751  . diclofenac sodium (VOLTAREN) 1 % transdermal gel 2 g  2 g Topical TID Jimmy Footman, MD   2 g at 08/27/16 0748  . famotidine (PEPCID) tablet 20 mg  20 mg Oral Daily Clapacs, Jackquline Denmark, MD   20 mg at 08/26/16 2026  . fesoterodine (TOVIAZ) tablet 4 mg  4 mg Oral Daily Jimmy Footman, MD   4 mg at 08/27/16 0752  . fluticasone (FLONASE) 50 MCG/ACT nasal spray 2 spray  2 spray Each Nare Daily Jimmy Footman, MD   2 spray at 08/27/16 0750  . levothyroxine (  SYNTHROID, LEVOTHROID) tablet 50 mcg  50 mcg Oral QAC breakfast Clapacs, Jackquline Denmark, MD   50 mcg at 08/27/16 [phone]  . lurasidone (LATUDA) tablet 40 mg  40 mg Oral Q supper Jimmy Footman, MD   40 mg at 08/26/16 1715  . magnesium hydroxide (MILK OF MAGNESIA) suspension 30 mL  30 mL Oral Daily PRN Clapacs, John T, MD      . mometasone-formoterol (DULERA) 200-5 MCG/ACT inhaler 2 puff  2 puff Inhalation BID Jimmy Footman, MD   2 puff at 08/27/16 0751  . naloxegol oxalate (MOVANTIK) tablet 25 mg  25 mg Oral Daily Jimmy Footman, MD   25 mg at 08/27/16 0752  . nicotine (NICODERM CQ - dosed in mg/24 hours) patch 21 mg  21 mg Transdermal Daily Jimmy Footman, MD   21 mg at 08/27/16 0926  . norethindrone (MICRONOR,CAMILA,ERRIN) 0.35 MG tablet 0.35 mg  1 tablet Oral Daily Jimmy Footman, MD      . oxyCODONE (Oxy IR/ROXICODONE) immediate release tablet 2.5 mg  2.5 mg Oral TID Jimmy Footman, MD   2.5 mg at 08/27/16 0751  . polyethylene glycol (MIRALAX / GLYCOLAX) packet 17 g  17 g Oral Daily Jimmy Footman, MD   17 g at 08/27/16 0748  . potassium chloride SA (K-DUR,KLOR-CON) CR tablet 40 mEq  40 mEq Oral BID Jimmy Footman, MD   40 mEq at 08/27/16 0752  .  tiotropium (SPIRIVA) inhalation capsule 18 mcg  18 mcg Inhalation Daily Clapacs, Jackquline Denmark, MD   18 mcg at 08/27/16 0749  . tiZANidine (ZANAFLEX) tablet 2 mg  2 mg Oral TID Jimmy Footman, MD   2 mg at 08/27/16 0752  . traMADol (ULTRAM) tablet 50 mg  50 mg Oral TID Clapacs, Jackquline Denmark, MD   50 mg at 08/27/16 5038  . venlafaxine XR (EFFEXOR-XR) 24 hr capsule 150 mg  150 mg Oral Q breakfast Jimmy Footman, MD   150 mg at 08/27/16 8828    Lab Results:  Results for orders placed or performed during the hospital encounter of 08/26/16 (from the past 48 hour(s))  Lipid panel     Status: Abnormal   Collection Time: 08/26/16 12:20 PM  Result Value Ref Range   Cholesterol 237 (H) 0 - 200 mg/dL   Triglycerides 003 (H) <150 mg/dL   HDL 38 (L) >49 mg/dL   Total CHOL/HDL Ratio 6.2 RATIO   VLDL 74 (H) 0 - 40 mg/dL   LDL Cholesterol 179 (H) 0 - 99 mg/dL    Comment:        Total Cholesterol/HDL:CHD Risk Coronary Heart Disease Risk Table                     Men   Women  1/2 Average Risk   3.4   3.3  Average Risk       5.0   4.4  2 X Average Risk   9.6   7.1  3 X Average Risk  23.4   11.0        Use the calculated Patient Ratio above and the CHD Risk Table to determine the patient's CHD Risk.        ATP III CLASSIFICATION (LDL):  <100     mg/dL   Optimal  150-569  mg/dL   Near or Above                    Optimal  130-159  mg/dL   Borderline  794-801  mg/dL   High  >462     mg/dL   Very High   Hepatitis C antibody     Status: None   Collection Time: 08/26/16 12:20 PM  Result Value Ref Range   HCV Ab <0.1 0.0 - 0.9 s/co ratio    Comment: (NOTE)                                  Negative:     < 0.8                             Indeterminate: 0.8 - 0.9                                  Positive:     > 0.9 The CDC recommends that a positive HCV antibody result be followed up with a HCV Nucleic Acid Amplification test (863817). Performed At: [facility] [address] [place], [place] [id number] Mila Homer MD YB:[id number]   TSH     Status: None   Collection Time: 08/26/16 12:20 PM  Result Value Ref Range   TSH 0.844 0.350 - 4.500 uIU/mL    Comment: Performed by a 3rd Generation assay with a functional sensitivity of <=0.01 uIU/mL.  Hemoglobin A1c     Status: None   Collection Time: 08/26/16 12:20 PM  Result Value Ref Range   Hgb A1c MFr Bld 5.6 4.8 - 5.6 %    Comment: (NOTE) Pre diabetes:          5.7%-6.4% Diabetes:              >6.4% Glycemic control for   <7.0% adults with diabetes    Mean Plasma Glucose 114.02 mg/dL    Comment: Performed at [facility], [address] [place], [place] [address]  Vitamin B12     Status: None   Collection Time: 08/26/16 12:20 PM  Result Value Ref Range   Vitamin B-12 426 180 - 914 pg/mL    Comment: (NOTE) This assay is not validated for testing neonatal or myeloproliferative syndrome specimens for Vitamin B12 levels. Performed at [facility], [address] [place], [place] [address]     Blood Alcohol level:  Lab Results  Component Value Date   ETH <5 08/25/2016    Metabolic Disorder Labs: Lab Results  Component Value Date   HGBA1C 5.6 08/26/2016   MPG 114.02 08/26/2016   No results found for: PROLACTIN Lab Results  Component Value Date   CHOL 237 (H) 08/26/2016   TRIG 368 (H) 08/26/2016   HDL 38 (L) 08/26/2016   CHOLHDL 6.2 08/26/2016   VLDL 74 (H) 08/26/2016   LDLCALC 125 (H) 08/26/2016    Physical Findings: AIMS:  , ,  ,  ,    CIWA:    COWS:     Musculoskeletal: Strength & Muscle Tone: within normal limits Gait & Station: normal Patient leans: N/A  Psychiatric Specialty Exam: Physical Exam  Constitutional: She is oriented to person, place, and time. She appears well-developed and well-nourished.  HENT:  Head: Normocephalic and atraumatic.  Eyes: Conjunctivae and EOM are normal.  Neck: Normal range of motion.  Respiratory: Effort normal.   Musculoskeletal: Normal range of motion.  Neurological: She is alert and oriented to person,  place, and time.    Review of Systems  Constitutional: Negative.   HENT: Negative.   Eyes: Negative.   Respiratory: Negative.   Cardiovascular: Negative.   Gastrointestinal: Negative.   Genitourinary: Negative.   Musculoskeletal: Negative.   Skin: Negative.   Neurological: Negative.   Endo/Heme/Allergies: Negative.   Psychiatric/Behavioral: Positive for depression.    Blood pressure 117/71, pulse 87, temperature 98.5 F (36.9 C), temperature source Oral, resp. rate 18, height 5\' 4"  (1.626 m), weight 68.5 kg (151 lb), last menstrual period 08/23/2016, SpO2 97 %.Body mass index is 25.92 kg/m.  General Appearance: Well Groomed  Eye Contact:  Good  Speech:  Clear and Coherent  Volume:  Normal  Mood:  Dysphoric  Affect:  Blunt  Thought Process:  Linear and Descriptions of Associations: Intact  Orientation:  Full (Time, Place, and Person)  Thought Content:  Hallucinations: None  Suicidal Thoughts:  No  Homicidal Thoughts:  No  Memory:  Immediate;   Good Recent;   Good Remote;   Good  Judgement:  Fair  Insight:  Fair  Psychomotor Activity:  Decreased  Concentration:  Concentration: Fair and Attention Span: Fair  Recall:  Fair  Fund of Knowledge:  Good  Language:  Good  Akathisia:  No  Handed:    AIMS (if indicated):     Assets:  Communication Skills Social Support  ADL's:  Intact  Cognition:  WNL  Sleep:  Number of Hours: 8.15     Treatment Plan Summary:  Bipolar disorder patient request to be started on Latuda. She says her sister is taking Jordan and doing very well. She also feels that Lamictal makes her feel numb and will like that medication discontinued. She does not feel Effexor is helping anymore with her mood.  Pt has been started on Latuda 40 mg with supper and Wellbutrin XL 150 mg a day. Lamictal has been discontinued and Effexor has been  decreased from 337.5 mg  to 150 mg by mouth daily.  ADHD patient claims she has been diagnosed with ADHD. Prior to admission she was on Ritalin. I will discontinue this medication and instead I will target her symptoms with wellbutrin  Borderline personality disorder. Patient tells me she is about to start group therapy at [facility]. She says sometimes she has troubles with transportation and these has prevented her from going to therapy more often  Chronic pain continue Voltaren gel 3 times a day, OxyContin 2.5 mg 3 times a day, tramadol 50 mg 3 times a day and zanaflex tid  COPD continue with inhalers  Seasonal allergies continue Flonase twice a day  Hypokalemia: will replace potassium 40 mEq twice a day for 2 days. We'll recheck potassium tomorrow am  Urinary incontinence continue with Detrol LA (here in hospital she will receive Gala Murdoch)  Constipation continue Moban take a MiraLAX daily  GERD continue Pepcid 20 mg a day   Labs: TSH, B12, hemoglobin A1c, lipid panel---all wnl  Patient is requesting hepatitis C to be checked--neg  [place] controlled substance data base has been checked---no issues noted  Jimmy Footman, MD 08/27/2016, 9:48 AM

## 2016-08-27 NOTE — [facility] Group Notes (Signed)
[facility] LCSW Group Therapy   08/27/2016  9:30 am   Type of Therapy: Group Therapy   Participation Level: Patient invited but did not attend.     Hampton Abbot, MSW, LCSWA 08/27/2016, 10:04AM

## 2016-08-27 NOTE — [facility] Suicide Risk Assessment (Signed)
[facility] INPATIENT:  Family/Significant Other Suicide Prevention Education  Suicide Prevention Education:  Education Completed; friend, Dellis Filbert ph#: [phone] has been identified by the patient as the family member/significant other with whom the patient will be residing, and identified as the person(s) who will aid the patient in the event of a mental health crisis (suicidal ideations/suicide attempt).  With written consent from the patient, the family member/significant other has been provided the following suicide prevention education, prior to the and/or following the discharge of the patient.  The suicide prevention education provided includes the following:  Suicide risk factors  Suicide prevention and interventions  National Suicide Hotline telephone number  [facility] assessment telephone number  [facility] Emergency Assistance 911  [facility] and/or Residential Mobile Crisis Unit telephone number  Request made of family/significant other to:  Remove weapons (e.g., guns, rifles, knives), all items previously/currently identified as safety concern.    Remove drugs/medications (over-the-counter, prescriptions, illicit drugs), all items previously/currently identified as a safety concern.  The family member/significant other verbalizes understanding of the suicide prevention education information provided.  The family member/significant other agrees to remove the items of safety concern listed above.  Lynden Oxford, MSW, LCSW-A 08/27/2016, 12:03 PM

## 2016-08-27 NOTE — Progress Notes (Signed)
Patient is alert and oriented to person, place and time. Skin is warm, dry and intact. No limitations to all four extremities noted. Denies SI/HI Patient complained of pain, 10 on a 10 scale. Medicated with scheduled medications with semi-effective results. Patient was observed ambulating in hall during the shift with a steady gait. Attends meals with appropriate peer interaction noted. Milieu remains therapeutic. Patient will be monitored and physician notified of any acute changes.

## 2016-08-27 NOTE — Plan of Care (Signed)
Problem: Spiritual Needs Goal: Ability to function at adequate level Outcome: Progressing Patient has the capability to function at an adequate level.

## 2016-08-28 LAB — BASIC METABOLIC PANEL
Anion gap: 6 (ref 5–15)
BUN: 13 mg/dL (ref 6–20)
CO2: 24 mmol/L (ref 22–32)
Calcium: 8.8 mg/dL — ABNORMAL LOW (ref 8.9–10.3)
Chloride: 109 mmol/L (ref 101–111)
Creatinine, Ser: 0.79 mg/dL (ref 0.44–1.00)
GFR calc Af Amer: >60 mL/min (ref >60)
GFR calc non Af Amer: >60 mL/min (ref >60)
Glucose, Bld: 88 mg/dL (ref 65–99)
Potassium: 4.9 mmol/L (ref 3.5–5.1)
Sodium: 139 mmol/L (ref 135–145)

## 2016-08-28 MED ORDER — OXYCODONE HCL 5 MG PO TABS: 2.5000 mg | ORAL_TABLET | Freq: Three times a day (TID) | ORAL | Status: DC | PRN

## 2016-08-28 MED ORDER — GABAPENTIN 600 MG PO TABS
300.0000 mg | ORAL_TABLET | Freq: Three times a day (TID) | ORAL | Status: DC
  Administered 2016-08-28 – 2016-09-01 (×13): 300 mg via ORAL
  Filled 2016-08-28 (×13): qty 1

## 2016-08-28 MED ORDER — VENLAFAXINE HCL 37.5 MG PO TABS
75.0000 mg | ORAL_TABLET | Freq: Three times a day (TID) | ORAL | Status: DC
  Administered 2016-08-28 – 2016-09-01 (×12): 75 mg via ORAL
  Filled 2016-08-28 (×16): qty 2

## 2016-08-28 NOTE — Plan of Care (Signed)
Problem: Coping: Goal: Ability to cope will improve Outcome: Not Progressing Patient continues with increased anxiety

## 2016-08-28 NOTE — Progress Notes (Signed)
Recreation Therapy Notes  Date: 08.23.18 Time: 1:00 pm Location: Craft Room  Group Topic: Leisure Education  Goal Area(s) Addresses:  Patient will identify activities for each letter of the alphabet. Patient will verbalize ability to integrate positive leisure into life post d/c. Patient will verbalize ability to use leisure as a Associate Professor.  Behavioral Response: Attentive, Interactive  Intervention: Leisure Alphabet  Activity: Patients were given a Leisure Information systems manager and were instructed to write healthy leisure activities for each letter of the Alphabet.  Education: LRT educated patients on what they need to participate in leisure.  Education Outcome: Acknowledges education/In group clarification offered   Clinical Observations/Feedback: Patient wrote some healthy leisure activities and some things like "apologizing to self". Patient contributed to group discussion by stating healthy leisure activities, and what makes leisure a good coping skill.  Jacquelynn Cree, LRT/CTRS 08/28/2016 1:54 PM

## 2016-08-28 NOTE — Progress Notes (Signed)
Patient presents flat and  anxious. Patient reports passive SI and verbally contracts for safety. Denies HI, AVH. Complains of pain to back and head today. Meds given with partial relief. Med and group compliant. Socializing with peers. Encouragement and support offered. Safety checks maintained. Medications given as prescribed. Pt receptive and remains safe on unit with q 15 min checks.

## 2016-08-28 NOTE — [facility] Group Notes (Signed)
[facility] LCSW Group Therapy Note  Date/Time: 08/28/16, 0930  Type of Therapy/Topic:  Group Therapy:  Balance in Life  Participation Level:  Did not attend  Description of Group:    This group will address the concept of balance and how it feels and looks when one is unbalanced. Patients will be encouraged to process areas in their lives that are out of balance, and identify reasons for remaining unbalanced. Facilitators will guide patients utilizing problem- solving interventions to address and correct the stressor making their life unbalanced. Understanding and applying boundaries will be explored and addressed for obtaining  and maintaining a balanced life. Patients will be encouraged to explore ways to assertively make their unbalanced needs known to significant others in their lives, using other group members and facilitator for support and feedback.  Therapeutic Goals: 1. Patient will identify two or more emotions or situations they have that consume much of in their lives. 2. Patient will identify signs/triggers that life has become out of balance:  3. Patient will identify two ways to set boundaries in order to achieve balance in their lives:  4. Patient will demonstrate ability to communicate their needs through discussion and/or role plays  Summary of Patient Progress:          Therapeutic Modalities:   Cognitive Behavioral Therapy Solution-Focused Therapy Assertiveness Training  Daleen Squibb, LCSW

## 2016-08-28 NOTE — Progress Notes (Signed)
D: Pt at the time of assessment was flat and withdrawn to self even while at the dayroom. Pt at the endorsed moderate anxiety; "my anxiety is the only problem I have at the moment; I know it's not going anywhere." Pt denied depression, SI, HI or AVH. Pt remained calm and cooperative. A: Medications offered as prescribed. Pt given the opportunity to ask questions and state concerns. Support, encouragement, and safe environment provided. R: Pt was med compliant. All patient's questions and concerns addressed. 15-minute safety checks continue. Safety checks continue.

## 2016-08-28 NOTE — Progress Notes (Signed)
[facility] MD Progress Note  08/28/2016 11:22 AM Kirsten Richardson  MRN:  [id number] Subjective:  41 year old Caucasian female with history of bipolar disorder, borderline personality and chronic pain presented voluntarily to our emergency department on August 20 complaining of suicidal ideation and thoughts of hurting people.  Patient reported not having any specific target as far as school she will hurt. She reported having thoughts of slitting her throat. Patient has had a multitude of suicidal attempts by overdose before with the last one being more than 10 years ago.  Patient follows up with [facility] where she sees Dr. Jacqualine Richardson and she also follows up with pain management Dr. Primus Richardson in [place].  Patient reports that she has been compliant with all her medications. She complains that they are not working anymore. She feels that the Lamictal is making her feel numb. She was prescribed with Abilify too but due to side effects she discontinue this medication.  Patient says that Dr. Jacqualine Richardson has also been treated her with ADHD patients that she's been taking Ritalin 10 mg 4 times a day  Patient feels very depressed, and she has no energy and feels like sleeping all day. She has no desire to engaging in any pleasurable activities. She states her home alone all the time.  8/22 she still rates her depression as a 9 out of 10 x 10 being the worst. Says that she is not having suicidal thoughts as often as she was when she first came in but she still has to pay she denies having any homicidal thoughts. She feels less angry and agitated. She feels very tired, her energy is very low. She slept very well last night. Pain is slightly better than yesterday  8/23 patient says that mood wise she is doing better however she has been feeling like if she were withdrawing. She said this morning she was sweating and felt very anxious. Also she has experienced worsening of her back pain. In addition she noted significant  restlessness (while she was standing talking to me she wouldn't stop moving).  Patient feels that Wellbutrin and Latuda are helping. She also states that in the past she had tried Neurontin and she will like to try that medication again for back pain. She is on the OxyContin and she doesn't feel that medication is really helping much.  Per nursing: D: Pt at the time of assessment was flat and withdrawn to self even while at the dayroom. Pt at the endorsed moderate anxiety; "my anxiety is the only problem I have at the moment; I know it's not going anywhere." Pt denied depression, SI, HI or AVH. Pt remained calm and cooperative. A: Medications offered as prescribed. Pt given the opportunity to ask questions and state concerns. Support, encouragement, and safe environment provided. R: Pt was med compliant. All patient's questions and concerns addressed. 15-minute safety checks continue. Safety checks continue.  Principal Problem: Bipolar 2 disorder, major depressive episode ([place]) Diagnosis:   Patient Active Problem List   Diagnosis Date Noted  . COPD (chronic obstructive pulmonary disease) ([place]) [J44.9] 08/26/2016  . Constipation [K59.00] 08/26/2016  . Bipolar 2 disorder, major depressive episode ([place]) [F31.81] 08/25/2016  . Essential hypertension [I10] 08/31/2015  . DDD (degenerative disc disease), lumbar [M51.36] 08/02/2015  . DDD (degenerative disc disease), cervical [M50.30] 08/02/2015  . 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 Hip pain, chronic [M25.559, G89.29] 09/27/2014  . Borderline personality disorder [F60.3] 09/27/2014  . SUI (stress urinary incontinence, female) [  N39.3] 01/02/2014   Total Time spent with patient: 30 minutes  Past Psychiatric History: Patient states she is being hospitalized multiple times per she's been diagnosed with bipolar disorder, ADHD, borderline personality disorder. She has had multiple suicidal attempts by overdose, at  least 4 prior suicidal attempts. The last one was in 2010.  Currently on Effexor 325 mg a day, Ritalin 10 mg 4 times a day, trazodone 200 mg daily at bedtime, Lamictal 200 mg.  Past Medical History:  Past Medical History:  Diagnosis Date  . Anemia   . Arthritis   . Degenerative disc disease, lumbar   . Depression   . Emphysema of lung ([place])   . GERD (gastroesophageal reflux disease)   . Hypertension   . Plantar fasciitis   . Thyroid disease     Past Surgical History:  Procedure Laterality Date  . FOOT SURGERY Right    plantar fasciatis  . HIP FRACTURE SURGERY Bilateral    Family History:  Family History  Problem Relation Age of Onset  . Arthritis Mother   . COPD Mother   . Cancer Mother   . Depression Mother   . Early death Mother   . Vision loss Mother   . Mental illness Mother   . Alcohol abuse Father   . Arthritis Father   . Cancer Father   . Diabetes Father   . Drug abuse Father   . Early death Father   . Vision loss Father   . Heart disease Father   . Hypertension Father   . Mental illness Father   . Stroke Father    Family Psychiatric  History: Reports that her sister and mother were diagnosed with depression. Her son was diagnosed with ADHD. She has nieces who suffer from mental illness but she doesn't know the diagnosis. Patient's father was an alcoholic  Social History: Patient is divorced. She lives by herself. She is disabled due to her medical issues. She has a GED education. She denies any history of legal problems in the past. She has 2 children and she helped raise her niece and nephew after her sister went to prison for killing  Husband.  Patient's sister is currently staying with the patient temporarily History  Alcohol Use No     History  Drug Use No    Social History   Social History  . Marital status: Single    Spouse name: N/A  . Number of children: N/A  . Years of education: N/A   Social History Main Topics  . Smoking status:  Former Smoker    Packs/day: 0.20    Types: Cigarettes    Quit date: 07/25/2015  . Smokeless tobacco: Never Used  . Alcohol use No  . Drug use: No  . Sexual activity: Yes    Birth control/ protection: Pill   Other Topics Concern  . Not on file   Social History Narrative  . No narrative on file     Current Medications: Current Facility-Administered Medications  Medication Dose Route Frequency Provider Last Rate Last Dose  . acetaminophen (TYLENOL) tablet 650 mg  650 mg Oral Q6H PRN Clapacs, John T, MD      . albuterol (PROVENTIL HFA;VENTOLIN HFA) 108 (90 Base) MCG/ACT inhaler 2 puff  2 puff Inhalation Q4H PRN Clapacs, John T, MD      . alum & mag hydroxide-simeth (MAALOX/MYLANTA) 200-200-20 MG/5ML suspension 30 mL  30 mL Oral Q4H PRN Clapacs, Madie Reno, MD      .  buPROPion (WELLBUTRIN XL) 24 hr tablet 150 mg  150 mg Oral Daily Hildred Priest, MD   150 mg at 08/28/16 0802  . diclofenac sodium (VOLTAREN) 1 % transdermal gel 2 g  2 g Topical TID Hildred Priest, MD   2 g at 08/28/16 0804  . famotidine (PEPCID) tablet 20 mg  20 mg Oral Daily Clapacs, Madie Reno, MD   20 mg at 08/27/16 8938  . fesoterodine (TOVIAZ) tablet 4 mg  4 mg Oral Daily Hildred Priest, MD   4 mg at 08/28/16 0804  . fluticasone (FLONASE) 50 MCG/ACT nasal spray 2 spray  2 spray Each Nare Daily Hildred Priest, MD   2 spray at 08/28/16 0803  . gabapentin (NEURONTIN) tablet 300 mg  300 mg Oral TID Hildred Priest, MD      . levothyroxine (SYNTHROID, LEVOTHROID) tablet 50 mcg  50 mcg Oral QAC breakfast Clapacs, Madie Reno, MD   50 mcg at 08/28/16 [phone]  . lurasidone (LATUDA) tablet 40 mg  40 mg Oral Q supper Hildred Priest, MD   40 mg at 08/27/16 1739  . magnesium hydroxide (MILK OF MAGNESIA) suspension 30 mL  30 mL Oral Daily PRN Clapacs, John T, MD      . mometasone-formoterol (DULERA) 200-5 MCG/ACT inhaler 2 puff  2 puff Inhalation BID Hildred Priest,  MD   2 puff at 08/28/16 0801  . naloxegol oxalate (MOVANTIK) tablet 25 mg  25 mg Oral Daily Hildred Priest, MD   25 mg at 08/28/16 0804  . nicotine (NICODERM CQ - dosed in mg/24 hours) patch 21 mg  21 mg Transdermal Daily Hildred Priest, MD   21 mg at 08/28/16 0801  . norethindrone (MICRONOR,CAMILA,ERRIN) 0.35 MG tablet 0.35 mg  1 tablet Oral Daily Hildred Priest, MD      . oxyCODONE (Oxy IR/ROXICODONE) immediate release tablet 2.5 mg  2.5 mg Oral TID PRN Hildred Priest, MD      . polyethylene glycol (MIRALAX / GLYCOLAX) packet 17 g  17 g Oral Daily Hildred Priest, MD   17 g at 08/28/16 0803  . tiotropium (SPIRIVA) inhalation capsule 18 mcg  18 mcg Inhalation Daily Clapacs, Madie Reno, MD   18 mcg at 08/28/16 0800  . tiZANidine (ZANAFLEX) tablet 2 mg  2 mg Oral TID Hildred Priest, MD   2 mg at 08/28/16 0803  . traMADol (ULTRAM) tablet 50 mg  50 mg Oral TID Clapacs, Madie Reno, MD   50 mg at 08/28/16 0802  . venlafaxine [facility]) tablet 75 mg  75 mg Oral TID WC Hildred Priest, MD        Lab Results:  Results for orders placed or performed during the hospital encounter of 08/26/16 (from the past 48 hour(s))  Lipid panel     Status: Abnormal   Collection Time: 08/26/16 12:20 PM  Result Value Ref Range   Cholesterol 237 (H) 0 - 200 mg/dL   Triglycerides 368 (H) <150 mg/dL   HDL 38 (L) >40 mg/dL   Total CHOL/HDL Ratio 6.2 RATIO   VLDL 74 (H) 0 - 40 mg/dL   LDL Cholesterol 125 (H) 0 - 99 mg/dL    Comment:        Total Cholesterol/HDL:CHD Risk Coronary Heart Disease Risk Table                     Men   Women  1/2 Average Risk   3.4   3.3  Average Risk  5.0   4.4  2 X Average Risk   9.6   7.1  3 X Average Risk  23.4   11.0        Use the calculated Patient Ratio above and the CHD Risk Table to determine the patient's CHD Risk.        ATP III CLASSIFICATION (LDL):  <100     mg/dL   Optimal  100-129  mg/dL    Near or Above                    Optimal  130-159  mg/dL   Borderline  160-189  mg/dL   High  >190     mg/dL   Very High   Hepatitis C antibody     Status: None   Collection Time: 08/26/16 12:20 PM  Result Value Ref Range   HCV Ab <0.1 0.0 - 0.9 s/co ratio    Comment: (NOTE)                                  Negative:     < 0.8                             Indeterminate: 0.8 - 0.9                                  Positive:     > 0.9 The CDC recommends that a positive HCV antibody result be followed up with a HCV Nucleic Acid Amplification test (025427). Performed At: [facility] [place], [place] [id number] Lindon Romp MD [id number]   TSH     Status: None   Collection Time: 08/26/16 12:20 PM  Result Value Ref Range   TSH 0.844 0.350 - 4.500 uIU/mL    Comment: Performed by a 3rd Generation assay with a functional sensitivity of <=0.01 uIU/mL.  Hemoglobin A1c     Status: None   Collection Time: 08/26/16 12:20 PM  Result Value Ref Range   Hgb A1c MFr Bld 5.6 4.8 - 5.6 %    Comment: (NOTE) Pre diabetes:          5.7%-6.4% Diabetes:              >6.4% Glycemic control for   <7.0% adults with diabetes    Mean Plasma Glucose 114.02 mg/dL    Comment: Performed at [facility] [address] [place], [place] [address]  Vitamin B12     Status: None   Collection Time: 08/26/16 12:20 PM  Result Value Ref Range   Vitamin B-12 426 180 - 914 pg/mL    Comment: (NOTE) This assay is not validated for testing neonatal or myeloproliferative syndrome specimens for Vitamin B12 levels. Performed at [facility], [place] [address], [place], [place] [address]   Basic metabolic panel     Status: Abnormal   Collection Time: 08/28/16  7:06 AM  Result Value Ref Range   Sodium 139 135 - 145 mmol/L   Potassium 4.9 3.5 - 5.1 mmol/L   Chloride 109 101 - 111 mmol/L   CO2 24 22 - 32 mmol/L   Glucose, Bld 88 65 - 99 mg/dL   BUN 13 6 - 20 mg/dL    Creatinine, Ser 0.79 0.44 -  1.00 mg/dL   Calcium 8.8 (L) 8.9 - 10.3 mg/dL   GFR calc non Af Amer >60 >60 mL/min   GFR calc Af Amer >60 >60 mL/min    Comment: (NOTE) The eGFR has been calculated using the CKD EPI equation. This calculation has not been validated in all clinical situations. eGFR's persistently <60 mL/min signify possible Chronic Kidney Disease.    Anion gap 6 5 - 15    Blood Alcohol level:  Lab Results  Component Value Date   ETH <5 69/67/8938    Metabolic Disorder Labs: Lab Results  Component Value Date   HGBA1C 5.6 08/26/2016   MPG 114.02 08/26/2016   No results found for: PROLACTIN Lab Results  Component Value Date   CHOL 237 (H) 08/26/2016   TRIG 368 (H) 08/26/2016   HDL 38 (L) 08/26/2016   CHOLHDL 6.2 08/26/2016   VLDL 74 (H) 08/26/2016   LDLCALC 125 (H) 08/26/2016    Physical Findings: AIMS:  , ,  ,  ,    CIWA:    COWS:     Musculoskeletal: Strength & Muscle Tone: within normal limits Gait & Station: normal Patient leans: N/A  Psychiatric Specialty Exam: Physical Exam  Constitutional: She is oriented to person, place, and time. She appears well-developed and well-nourished.  HENT:  Head: Normocephalic and atraumatic.  Eyes: Conjunctivae and EOM are normal.  Neck: Normal range of motion.  Respiratory: Effort normal.  Musculoskeletal: Normal range of motion.  Neurological: She is alert and oriented to person, place, and time.    Review of Systems  Constitutional: Negative.   HENT: Negative.   Eyes: Negative.   Respiratory: Negative.   Cardiovascular: Negative.   Gastrointestinal: Negative.   Genitourinary: Negative.   Musculoskeletal: Positive for back pain.  Skin: Negative.   Neurological: Negative.   Endo/Heme/Allergies: Negative.   Psychiatric/Behavioral: Positive for depression.    Blood pressure 125/66, pulse 82, temperature 98.5 F (36.9 C), resp. rate 18, height _0  (1.626 m), weight 68.5 kg (151 lb), last menstrual  period 08/23/2016, SpO2 97 %.Body mass index is 25.92 kg/m.  General Appearance: Well Groomed  Eye Contact:  Good  Speech:  Clear and Coherent  Volume:  Normal  Mood:  Dysphoric  Affect:  Blunt  Thought Process:  Linear and Descriptions of Associations: Intact  Orientation:  Full (Time, Place, and Person)  Thought Content:  Hallucinations: None  Suicidal Thoughts:  No  Homicidal Thoughts:  No  Memory:  Immediate;   Good Recent;   Good Remote;   Good  Judgement:  Fair  Insight:  Fair  Psychomotor Activity:  Decreased  Concentration:  Concentration: Fair and Attention Span: Fair  Recall:  [place] of Knowledge:  Good  Language:  Good  Akathisia:  No  Handed:    AIMS (if indicated):     Assets:  Communication Skills Social Support  ADL's:  Intact  Cognition:  WNL  Sleep:  Number of Hours: 4.25     Treatment Plan Summary:  Bipolar disorder patient request to be started on Latuda. She says her sister is taking [place] and doing very well. She also feels that Lamictal makes her feel numb and will like that medication discontinued. She does not feel Effexor is helping anymore with her mood.  Pt has been started on Latuda 40 mg with supper and Wellbutrin XL 150 mg a day. Lamictal has been discontinued and Effexor has been  decreased from 337.5 mg to 150 mg by mouth  daily.  Potentially the restlessness and feeling "withdrawal could be secondary to decreasing the Effexor to fast. We we'll increase the dose of Effexor to a total of 225 mg. Effexor will be prescribed as immediate release Effexor or 75 mg 3 times a day  ADHD patient claims she has been diagnosed with ADHD. Prior to admission she was on Ritalin. I will discontinue this medication and instead I will target her symptoms with wellbutrin  Borderline personality disorder. Patient tells me she is about to start group therapy at [facility]. She says sometimes she has troubles with transportation and these has prevented her from  going to therapy more often  Chronic pain continue Voltaren gel 3 times a day, tramadol 50 mg 3 times a day and zanaflex tid. OxyContin will be used only as needed. Patient requested to be started on Neurontin 300 mg 3 times a day.  COPD continue with inhalers  Seasonal allergies continue Flonase twice a day  Hypokalemia: Potassium has been replaced and is now within the normal limits   Urinary incontinence continue with Detrol LA (here in hospital she will receive Lisbeth Ply)  Constipation continue Moban take a MiraLAX daily  GERD continue Pepcid 20 mg a day   Labs: TSH, B12, hemoglobin A1c, lipid panel---all wnl  Patient is requesting hepatitis C to be checked--neg  [place] controlled substance data base has been checked---no issues noted  Hildred Priest, MD 08/28/2016, 11:22 AM

## 2016-08-29 MED ORDER — RISPERIDONE 1 MG PO TABS
1.0000 mg | ORAL_TABLET | Freq: Every day | ORAL | Status: DC
  Administered 2016-08-29 – 2016-08-30 (×2): 1 mg via ORAL
  Filled 2016-08-29 (×2): qty 1

## 2016-08-29 NOTE — Plan of Care (Signed)
Problem: [facility] Participation in Recreation Therapeutic Interventions Goal: STG-Patient will demonstrate improved self esteem by identif STG: Self-Esteem - Within 4 treatment sessions, patient will verbalize at least 5 positive affirmation statements in each of 2 treatment sessions to increase self-esteem.  Outcome: Completed/Met Date Met: 08/29/16 Treatment Session 2; Completed 2 out of 2: At approximately 11:30 am, LRT met with patient in patient room. Patient verbalized 5 positive affirmation statements. Patient reported it felt "good". LRT encouraged patient continue saying positive affirmation statements.  Leonette Monarch, LRT/CTRS 08.24.18 2:14 pm Goal: STG-Other Recreation Therapy Goal (Specify) STG: Stress Management - Within 4 treatment sessions, patient will verbalize understanding of the stress management techniques in each of 2 treatment sessions to increase stress management skills.  Outcome: Completed/Met Date Met: 08/29/16 Treatment Session 2; Completed 2 out of 2: At approximately 11:30 am, LRT met with patient in patient room. Patient reported she read over and practiced the stress management techniques. Patient verbalized understanding and reported the techniques were helpful. LRT encouraged patient to continue practicing the stress management techniques.  Leonette Monarch, LRT/CTRS 08.24.18 2:17 pm

## 2016-08-29 NOTE — Plan of Care (Signed)
Problem: Education: Goal: Emotional status will improve Outcome: Progressing Has remained calm and cooperative

## 2016-08-29 NOTE — Progress Notes (Signed)
[facility] MD Progress Note  08/29/2016 10:27 AM LYNNDA WIERSMA  MRN:  [id number] Subjective:  41 year old Caucasian female with history of bipolar disorder, borderline personality and chronic pain presented voluntarily to our emergency department on August 20 complaining of suicidal ideation and thoughts of hurting people.  Patient reported not having any specific target as far as school she will hurt. She reported having thoughts of slitting her throat. Patient has had a multitude of suicidal attempts by overdose before with the last one being more than 10 years ago.  Patient follows up with [facility] where she sees Dr. Jacqualine Code and she also follows up with pain management Dr. Primus Bravo in [place].  Patient reports that she has been compliant with all her medications. She complains that they are not working anymore. She feels that the Lamictal is making her feel numb. She was prescribed with Abilify too but due to side effects she discontinue this medication.  Patient says that Dr. Jacqualine Code has also been treated her with ADHD patients that she's been taking Ritalin 10 mg 4 times a day  Patient feels very depressed, and she has no energy and feels like sleeping all day. She has no desire to engaging in any pleasurable activities. She states her home alone all the time.  8/22 she still rates her depression as a 9 out of 10 x 10 being the worst. Says that she is not having suicidal thoughts as often as she was when she first came in but she still has to pay she denies having any homicidal thoughts. She feels less angry and agitated. She feels very tired, her energy is very low. She slept very well last night. Pain is slightly better than yesterday  8/23 patient says that mood wise she is doing better however she has been feeling like if she were withdrawing. She said this morning she was sweating and felt very anxious. Also she has experienced worsening of her back pain. In addition she noted significant  restlessness (while she was standing talking to me she wouldn't stop moving).  Patient feels that Wellbutrin and Latuda are helping. She also states that in the past she had tried Neurontin and she will like to try that medication again for back pain. She is on the OxyContin and she doesn't feel that medication is really helping much.  8/24 patient continues to have significant restlessness. She is constantly moving and cannot sit still. She  does not appear to be bothered by it much. She says that when she took Risperdal in the past has she did not have issues with restlessness. She wonders if we should just do Risperdal instead of Latuda. Other than that she feels that her mood has significantly improved. She is no longer suicidal or homicidal. She denies any auditory or visual hallucinations. She has been sleeping well. Her energy has improved. She met today with [facility] peers support specialist and she plans to follow up with them. Other than the restlessness the patient reports doing much better.     Per nursing: Patient presents flat and  anxious. Patient reports passive SI and verbally contracts for safety. Denies HI, AVH. Complains of pain to back and head today. Meds given with partial relief. Med and group compliant. Socializing with peers. Encouragement and support offered. Safety checks maintained. Medications given as prescribed. Pt receptive and remains safe on unit with q 15 min checks.   Principal Problem: Bipolar 2 disorder, major depressive episode ([place]) Diagnosis:   Patient Active Problem  List   Diagnosis Date Noted  . COPD (chronic obstructive pulmonary disease) ([place]) [J44.9] 08/26/2016  . Constipation [K59.00] 08/26/2016  . Bipolar 2 disorder, major depressive episode ([place]) [F31.81] 08/25/2016  . Essential hypertension [I10] 08/31/2015  . DDD (degenerative disc disease), lumbar [M51.36] 08/02/2015  . DDD (degenerative disc disease), cervical [M50.30] 08/02/2015  .  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 Hip pain, chronic [M25.559, G89.29] 09/27/2014  . Borderline personality disorder [F60.3] 09/27/2014  . SUI (stress urinary incontinence, female) [N39.3] 01/02/2014   Total Time spent with patient: 30 minutes  Past Psychiatric History: Patient states she is being hospitalized multiple times per she's been diagnosed with bipolar disorder, ADHD, borderline personality disorder. She has had multiple suicidal attempts by overdose, at least 4 prior suicidal attempts. The last one was in 2010.  Currently on Effexor 325 mg a day, Ritalin 10 mg 4 times a day, trazodone 200 mg daily at bedtime, Lamictal 200 mg.  Past Medical History:  Past Medical History:  Diagnosis Date  . Anemia   . Arthritis   . Degenerative disc disease, lumbar   . Depression   . Emphysema of lung ([place])   . GERD (gastroesophageal reflux disease)   . Hypertension   . Plantar fasciitis   . Thyroid disease     Past Surgical History:  Procedure Laterality Date  . FOOT SURGERY Right    plantar fasciatis  . HIP FRACTURE SURGERY Bilateral    Family History:  Family History  Problem Relation Age of Onset  . Arthritis Mother   . COPD Mother   . Cancer Mother   . Depression Mother   . Early death Mother   . Vision loss Mother   . Mental illness Mother   . Alcohol abuse Father   . Arthritis Father   . Cancer Father   . Diabetes Father   . Drug abuse Father   . Early death Father   . Vision loss Father   . Heart disease Father   . Hypertension Father   . Mental illness Father   . Stroke Father    Family Psychiatric  History: Reports that her sister and mother were diagnosed with depression. Her son was diagnosed with ADHD. She has nieces who suffer from mental illness but she doesn't know the diagnosis. Patient's father was an alcoholic  Social History: Patient is divorced. She lives by herself. She is disabled due to her medical issues.  She has a GED education. She denies any history of legal problems in the past. She has 2 children and she helped raise her niece and nephew after her sister went to prison for killing  Husband.  Patient's sister is currently staying with the patient temporarily History  Alcohol Use No     History  Drug Use No    Social History   Social History  . Marital status: Single    Spouse name: N/A  . Number of children: N/A  . Years of education: N/A   Social History Main Topics  . Smoking status: Former Smoker    Packs/day: 0.20    Types: Cigarettes    Quit date: 07/25/2015  . Smokeless tobacco: Never Used  . Alcohol use No  . Drug use: No  . Sexual activity: Yes    Birth control/ protection: Pill   Other Topics Concern  . None   Social History Narrative  . None     Current Medications: Current Facility-Administered Medications  Medication Dose Route Frequency Provider Last Rate Last Dose  . acetaminophen (TYLENOL) tablet 650 mg  650 mg Oral Q6H PRN Clapacs, Madie Reno, MD   650 mg at 08/29/16 0649  . albuterol (PROVENTIL HFA;VENTOLIN HFA) 108 (90 Base) MCG/ACT inhaler 2 puff  2 puff Inhalation Q4H PRN Clapacs, John T, MD      . alum & mag hydroxide-simeth (MAALOX/MYLANTA) 200-200-20 MG/5ML suspension 30 mL  30 mL Oral Q4H PRN Clapacs, John T, MD      . buPROPion (WELLBUTRIN XL) 24 hr tablet 150 mg  150 mg Oral Daily Hildred Priest, MD   150 mg at 08/29/16 1009  . diclofenac sodium (VOLTAREN) 1 % transdermal gel 2 g  2 g Topical TID Hildred Priest, MD   2 g at 08/29/16 1009  . famotidine (PEPCID) tablet 20 mg  20 mg Oral Daily Clapacs, Madie Reno, MD   20 mg at 08/28/16 2141  . fesoterodine (TOVIAZ) tablet 4 mg  4 mg Oral Daily Hildred Priest, MD   4 mg at 08/29/16 0847  . fluticasone (FLONASE) 50 MCG/ACT nasal spray 2 spray  2 spray Each Nare Daily Hildred Priest, MD   2 spray at 08/29/16 0850  . gabapentin (NEURONTIN) tablet 300 mg  300  mg Oral TID Hildred Priest, MD   300 mg at 08/29/16 0846  . levothyroxine (SYNTHROID, LEVOTHROID) tablet 50 mcg  50 mcg Oral QAC breakfast Clapacs, Madie Reno, MD   50 mcg at 08/29/16 0601  . magnesium hydroxide (MILK OF MAGNESIA) suspension 30 mL  30 mL Oral Daily PRN Clapacs, John T, MD      . mometasone-formoterol (DULERA) 200-5 MCG/ACT inhaler 2 puff  2 puff Inhalation BID Hildred Priest, MD   2 puff at 08/29/16 0850  . naloxegol oxalate (MOVANTIK) tablet 25 mg  25 mg Oral Daily Hildred Priest, MD   25 mg at 08/29/16 0847  . nicotine (NICODERM CQ - dosed in mg/24 hours) patch 21 mg  21 mg Transdermal Daily Hildred Priest, MD   21 mg at 08/29/16 0846  . norethindrone (MICRONOR,CAMILA,ERRIN) 0.35 MG tablet 0.35 mg  1 tablet Oral Daily Hildred Priest, MD      . polyethylene glycol (MIRALAX / GLYCOLAX) packet 17 g  17 g Oral Daily Hildred Priest, MD   17 g at 08/29/16 0847  . risperiDONE (RISPERDAL) tablet 1 mg  1 mg Oral QHS Hildred Priest, MD      . tiotropium [facility]) inhalation capsule 18 mcg  18 mcg Inhalation Daily Clapacs, Madie Reno, MD   18 mcg at 08/29/16 0850  . tiZANidine (ZANAFLEX) tablet 2 mg  2 mg Oral TID Hildred Priest, MD   2 mg at 08/29/16 0848  . traMADol (ULTRAM) tablet 50 mg  50 mg Oral TID Clapacs, Madie Reno, MD   50 mg at 08/29/16 0846  . venlafaxine [facility]) tablet 75 mg  75 mg Oral TID WC Hildred Priest, MD   75 mg at 08/29/16 0849    Lab Results:  Results for orders placed or performed during the hospital encounter of 08/26/16 (from the past 48 hour(s))  Basic metabolic panel     Status: Abnormal   Collection Time: 08/28/16  7:06 AM  Result Value Ref Range   Sodium 139 135 - 145 mmol/L   Potassium 4.9 3.5 - 5.1 mmol/L   Chloride 109 101 - 111 mmol/L   CO2 24 22 - 32 mmol/L   Glucose, Bld 88 65 - 99 mg/dL  BUN 13 6 - 20 mg/dL   Creatinine, Ser 0.79 0.44 - 1.00 mg/dL    Calcium 8.8 (L) 8.9 - 10.3 mg/dL   GFR calc non Af Amer >60 >60 mL/min   GFR calc Af Amer >60 >60 mL/min    Comment: (NOTE) The eGFR has been calculated using the CKD EPI equation. This calculation has not been validated in all clinical situations. eGFR's persistently <60 mL/min signify possible Chronic Kidney Disease.    Anion gap 6 5 - 15    Blood Alcohol level:  Lab Results  Component Value Date   ETH <5 93/81/8299    Metabolic Disorder Labs: Lab Results  Component Value Date   HGBA1C 5.6 08/26/2016   MPG 114.02 08/26/2016   No results found for: PROLACTIN Lab Results  Component Value Date   CHOL 237 (H) 08/26/2016   TRIG 368 (H) 08/26/2016   HDL 38 (L) 08/26/2016   CHOLHDL 6.2 08/26/2016   VLDL 74 (H) 08/26/2016   LDLCALC 125 (H) 08/26/2016    Physical Findings: AIMS:  , ,  ,  ,    CIWA:    COWS:     Musculoskeletal: Strength & Muscle Tone: within normal limits Gait & Station: normal Patient leans: N/A  Psychiatric Specialty Exam: Physical Exam  Constitutional: She is oriented to person, place, and time. She appears well-developed and well-nourished.  HENT:  Head: Normocephalic and atraumatic.  Eyes: Conjunctivae and EOM are normal.  Neck: Normal range of motion.  Respiratory: Effort normal.  Musculoskeletal: Normal range of motion.  Neurological: She is alert and oriented to person, place, and time.    Review of Systems  Constitutional: Negative.   HENT: Negative.   Eyes: Negative.   Respiratory: Negative.   Cardiovascular: Negative.   Gastrointestinal: Negative.   Genitourinary: Negative.   Musculoskeletal: Positive for back pain.  Skin: Negative.   Neurological: Negative.   Endo/Heme/Allergies: Negative.   Psychiatric/Behavioral: Positive for depression.    Blood pressure (!) 148/75, pulse (!) 196, temperature 98.6 F (37 C), temperature source Oral, resp. rate 18, height 5' 4"  (1.626 m), weight 68.5 kg (151 lb), last menstrual period  08/23/2016, SpO2 96 %.Body mass index is 25.92 kg/m.  General Appearance: Well Groomed  Eye Contact:  Good  Speech:  Clear and Coherent  Volume:  Normal  Mood:  Dysphoric  Affect:  Blunt  Thought Process:  Linear and Descriptions of Associations: Intact  Orientation:  Full (Time, Place, and Person)  Thought Content:  Hallucinations: None  Suicidal Thoughts:  No  Homicidal Thoughts:  No  Memory:  Immediate;   Good Recent;   Good Remote;   Good  Judgement:  Fair  Insight:  Fair  Psychomotor Activity:  Decreased  Concentration:  Concentration: Fair and Attention Span: Fair  Recall:  [place] of Knowledge:  Good  Language:  Good  Akathisia:  No  Handed:    AIMS (if indicated):     Assets:  Communication Skills Social Support  ADL's:  Intact  Cognition:  WNL  Sleep:  Number of Hours: 7.25     Treatment Plan Summary:  Bipolar disorder patient request to be started on Latuda. She says her sister is taking [place] and doing very well. She also feels that Lamictal makes her feel numb and will like that medication discontinued. She does not feel Effexor is helping anymore with her mood.  Pt has been started on Latuda 40 mg with supper and Wellbutrin XL 150 mg a  day. Lamictal has been discontinued and Effexor has been  decreased from 337.5 mg to 75 mg tid.  Patient has developed significant restlessness over the last 2 days. Likely akathisia from treatment with Latuda. We're going to discontinue [place] today and instead start the patient on low dose risperdal 1 mg by mouth daily at bedtime.  ADHD patient claims she has been diagnosed with ADHD. Prior to admission she was on Ritalin. I will discontinue this medication and instead I will target her symptoms with wellbutrin  Borderline personality disorder. Patient tells me she is about to start group therapy at [facility]. She says sometimes she has troubles with transportation and these has prevented her from going to therapy more  often  Chronic pain continue Voltaren gel 3 times a day, tramadol 50 mg 3 times a day. Neurontin 300 mg 3 times a day and Zanaflex 3 times a day. OxyContin has been discontinued as requested by patient  COPD continue with inhalers  Seasonal allergies continue Flonase twice a day  Hypokalemia: Potassium has been replaced and is now within the normal limits   Urinary incontinence continue with Detrol LA (here in hospital she will receive Lisbeth Ply)  Constipation continue Moban take a MiraLAX daily  GERD continue Pepcid 20 mg a day   Labs: TSH, B12, hemoglobin A1c, lipid panel---all wnl  Patient is requesting hepatitis C to be checked--neg  [place] controlled substance data base has been checked---no issues noted  Potential discharge early next week  Hildred Priest, MD 08/29/2016, 10:27 AM

## 2016-08-29 NOTE — Progress Notes (Signed)
Recreation Therapy Notes  Date: 08.24.18 Time: 1:00 pm Location: Craft Room  Group Topic: Coping Skills  Goal Area(s) Addresses:  Patient will verbalize importance of recognizing emotions. Patient will identify at least one emotion. Patient will identify at least one coping skill.  Behavioral Response: Attentive, Interactive  Intervention: Emotion Wheel  Activity: Patients were given an Arboriculturist and were instructed as a group to identify 8 emotions towards their recovery. Patients were then instructed to write healthy coping skills to help them cope with each emotion.  Education: LRT educated patients on healthy coping skills.  Education Outcome: Acknowledges education/In group clarification offered  Clinical Observations/Feedback: Patient identified emotions she feels in recovery and wrote down some healthy coping skills. Patient contributed to group discussion by stating it was difficult to think of emotions and why, why it is important to recognize her emotions, and how she can remind herself to use her healthy coping skills.  Jacquelynn Cree, LRT/CTRS 08/29/2016 1:48 PM

## 2016-08-29 NOTE — Plan of Care (Signed)
Problem: Education: Goal: Mental status will improve Outcome: Progressing Alert and oriented    

## 2016-08-29 NOTE — Progress Notes (Signed)
Patient is pleasant & cooperative in the unit.Appropriate with staff & peers.Patient stated that her back is bothering her at times.Denies suicidal or homicidal ideations and AV hallucinations.compliant with medications.Appetite and energy level good.Attended groups.Support & encouragement given.

## 2016-08-29 NOTE — Plan of Care (Signed)
Problem: Education: Goal: Ability to make informed decisions regarding treatment will improve Outcome: Progressing Patient denies suicidal ideations.

## 2016-08-29 NOTE — Progress Notes (Signed)
Patient stayed in the milieu and participated in different activities. Alert and oriented and denying suicidal/homicidal thoughts. Denying hallucinations. Pt attended group, had medications and snack and went to bed. Currently sleeping. No sign of discomfort. Safety precautions maintained.

## 2016-08-29 NOTE — Plan of Care (Signed)
Problem: Coping: Goal: Ability to cope will improve Outcome: Progressing Maintained a positive attitude and able to discuss her concerns

## 2016-08-29 NOTE — Plan of Care (Signed)
Problem: Education: Goal: Verbalization of understanding the information provided will improve Outcome: Progressing Understand medication regime and remains compliant

## 2016-08-29 NOTE — Progress Notes (Signed)
Patient attended afternoon group. Patient was polite and asked questions. Patient was familiar with the material being covered.    08/29/16 1400  Clinical Encounter Type  Visited With Patient;Other (Comment)  Visit Type Spiritual support  Referral From Patient

## 2016-08-29 NOTE — [facility] Group Notes (Signed)
[facility] LCSW Group Therapy   08/29/2016 9:30 AM   Type of Therapy: Group Therapy   Participation Level: Patient invited but did not attend.    Hampton Abbot, MSW, LCSW-A 08/29/2016, 10:20 AM

## 2016-08-30 NOTE — Progress Notes (Addendum)
D:Patient  Working on Pharmacologist D: Patient stated slept good last night .Stated appetite is good and energy level  Is normal. Stated concentration is good . Stated on Depression scale 2, hopeless  1 and anxiety 2 .( low 0-10 high) Denies suicidal  homicidal ideations  .  No auditory hallucinations  No pain concerns . Appropriate ADL'S. Interacting with peers and staff.   A: Encourage patient participation with unit programming . Instruction  Given on  Medication , verbalize understanding. R: Voice no other concerns. Staff continue to monitor

## 2016-08-30 NOTE — Progress Notes (Signed)
[facility] MD Progress Note  08/30/2016 8:33 AM Kirsten Richardson  MRN:  [id number] Subjective:  41 year old Caucasian female with history of bipolar disorder, borderline personality and chronic pain presented voluntarily to our emergency department on August 20 complaining of suicidal ideation and thoughts of hurting people.  Patient reported not having any specific target as far as school she will hurt. She reported having thoughts of slitting her throat. Patient has had a multitude of suicidal attempts by overdose before with the last one being more than 10 years ago.  Patient follows up with [facility] where she sees Dr. Jacqualine Code and she also follows up with pain management Dr. Primus Bravo in [place].  Patient reports that she has been compliant with all her medications. She complains that they are not working anymore. She feels that the Lamictal is making her feel numb. She was prescribed with Abilify too but due to side effects she discontinue this medication.  Patient says that Dr. Jacqualine Code has also been treated her with ADHD patients that she's been taking Ritalin 10 mg 4 times a day  Patient feels very depressed, and she has no energy and feels like sleeping all day. She has no desire to engaging in any pleasurable activities. She states her home alone all the time.  8/22 she still rates her depression as a 9 out of 10 x 10 being the worst. Says that she is not having suicidal thoughts as often as she was when she first came in but she still has to pay she denies having any homicidal thoughts. She feels less angry and agitated. She feels very tired, her energy is very low. She slept very well last night. Pain is slightly better than yesterday  8/23 patient says that mood wise she is doing better however she has been feeling like if she were withdrawing. She said this morning she was sweating and felt very anxious. Also she has experienced worsening of her back pain. In addition she noted significant  restlessness (while she was standing talking to me she wouldn't stop moving).  Patient feels that Wellbutrin and Latuda are helping. She also states that in the past she had tried Neurontin and she will like to try that medication again for back pain. She is on the OxyContin and she doesn't feel that medication is really helping much.  8/24 patient continues to have significant restlessness. She is constantly moving and cannot sit still. She  does not appear to be bothered by it much. She says that when she took Risperdal in the past has she did not have issues with restlessness. She wonders if we should just do Risperdal instead of Latuda. Other than that she feels that her mood has significantly improved. She is no longer suicidal or homicidal. She denies any auditory or visual hallucinations. She has been sleeping well. Her energy has improved. She met today with [facility] peers support specialist and she plans to follow up with them. Other than the restlessness the patient reports doing much better.   08/30/16 The patient reports that she slept fairly well last night, over 7 hours per nursing. She feels much better today now that she is no longer taking Latuda. She is glad that she was transitioned to Risperdal but also remembers being taken off Risperdal in the past because it was not effective. For now, she is willing to consider staying on Risperdal. He says her mood is "much better" and she is denying any current active or passive suicidal thoughts or  psychotic symptoms. The patient says she is learning mindfulness skills in group to help calm herself down when her anxiety and anger get triggered. She denies any problems with her appetite. The patient continues to report chronic neck and back pain improved with tramadol. Vital signs are stable. After discharge, the patient is planning to stay with a family friend. She wants to be able to get transportation so she can return to attending groups at  [facility].  Supportive psychotherapy provided and Times reviewing mindfulness skills to help with anger. The patient says her triggers or regulation when people do not listen to her.     Principal Problem: Bipolar 2 disorder, major depressive episode ([place]) Diagnosis:   Patient Active Problem List   Diagnosis Date Noted  . COPD (chronic obstructive pulmonary disease) ([place]) [J44.9] 08/26/2016  . Constipation [K59.00] 08/26/2016  . Bipolar 2 disorder, major depressive episode ([place]) [F31.81] 08/25/2016  . Essential hypertension [I10] 08/31/2015  . DDD (degenerative disc disease), lumbar [M51.36] 08/02/2015  . DDD (degenerative disc disease), cervical [M50.30] 08/02/2015  . 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 Hip pain, chronic [M25.559, G89.29] 09/27/2014  . Borderline personality disorder [F60.3] 09/27/2014  . SUI (stress urinary incontinence, female) [N39.3] 01/02/2014   Total Time spent with patient: 20 minutes  Past Psychiatric History: Patient states she is being hospitalized multiple times per she's been diagnosed with bipolar disorder, ADHD, borderline personality disorder. She has had multiple suicidal attempts by overdose, at least 4 prior suicidal attempts. The last one was in 2010.    Past Medical History:  Past Medical History:  Diagnosis Date  . Anemia   . Arthritis   . Degenerative disc disease, lumbar   . Depression   . Emphysema of lung ([place])   . GERD (gastroesophageal reflux disease)   . Hypertension   . Plantar fasciitis   . Thyroid disease     Past Surgical History:  Procedure Laterality Date  . FOOT SURGERY Right    plantar fasciatis  . HIP FRACTURE SURGERY Bilateral    Family History:  Family History  Problem Relation Age of Onset  . Arthritis Mother   . COPD Mother   . Cancer Mother   . Depression Mother   . Early death Mother   . Vision loss Mother   . Mental illness Mother   . Alcohol abuse Father   .  Arthritis Father   . Cancer Father   . Diabetes Father   . Drug abuse Father   . Early death Father   . Vision loss Father   . Heart disease Father   . Hypertension Father   . Mental illness Father   . Stroke Father    Family Psychiatric  History: Reports that her sister and mother were diagnosed with depression. Her son was diagnosed with ADHD. She has nieces who suffer from mental illness but she doesn't know the diagnosis. Patient's father was an alcoholic  Social History: Patient is divorced. She lives by herself. She is disabled due to her medical issues. She has a GED education. She denies any history of legal problems in the past. She has 2 children and she helped raise her niece and nephew after her sister went to prison for killing  Husband.  Patient's sister is currently staying with the patient temporarily History  Alcohol Use No     History  Drug Use No    Social History   Social History  . Marital status:  Single    Spouse name: N/A  . Number of children: N/A  . Years of education: N/A   Social History Main Topics  . Smoking status: Former Smoker    Packs/day: 0.20    Types: Cigarettes    Quit date: 07/25/2015  . Smokeless tobacco: Never Used  . Alcohol use No  . Drug use: No  . Sexual activity: Yes    Birth control/ protection: Pill   Other Topics Concern  . None   Social History Narrative  . None     Current Medications: Current Facility-Administered Medications  Medication Dose Route Frequency Provider Last Rate Last Dose  . acetaminophen (TYLENOL) tablet 650 mg  650 mg Oral Q6H PRN Clapacs, John T, MD   650 mg at 08/29/16 1519  . albuterol (PROVENTIL HFA;VENTOLIN HFA) 108 (90 Base) MCG/ACT inhaler 2 puff  2 puff Inhalation Q4H PRN Clapacs, John T, MD      . alum & mag hydroxide-simeth (MAALOX/MYLANTA) 200-200-20 MG/5ML suspension 30 mL  30 mL Oral Q4H PRN Clapacs, John T, MD      . buPROPion (WELLBUTRIN XL) 24 hr tablet 150 mg  150 mg Oral Daily  Hildred Priest, MD   150 mg at 08/29/16 1009  . diclofenac sodium (VOLTAREN) 1 % transdermal gel 2 g  2 g Topical TID Hildred Priest, MD   2 g at 08/29/16 1725  . famotidine (PEPCID) tablet 20 mg  20 mg Oral Daily Clapacs, Madie Reno, MD   20 mg at 08/29/16 1722  . fesoterodine (TOVIAZ) tablet 4 mg  4 mg Oral Daily Hildred Priest, MD   4 mg at 08/29/16 0847  . fluticasone (FLONASE) 50 MCG/ACT nasal spray 2 spray  2 spray Each Nare Daily Hildred Priest, MD   2 spray at 08/30/16 [phone]  . gabapentin (NEURONTIN) tablet 300 mg  300 mg Oral TID Hildred Priest, MD   300 mg at 08/29/16 1721  . levothyroxine (SYNTHROID, LEVOTHROID) tablet 50 mcg  50 mcg Oral QAC breakfast Clapacs, Madie Reno, MD   50 mcg at 08/30/16 [phone]  . magnesium hydroxide (MILK OF MAGNESIA) suspension 30 mL  30 mL Oral Daily PRN Clapacs, John T, MD      . mometasone-formoterol (DULERA) 200-5 MCG/ACT inhaler 2 puff  2 puff Inhalation BID Hildred Priest, MD   2 puff at 08/30/16 0829  . naloxegol oxalate (MOVANTIK) tablet 25 mg  25 mg Oral Daily Hildred Priest, MD   25 mg at 08/29/16 0847  . nicotine (NICODERM CQ - dosed in mg/24 hours) patch 21 mg  21 mg Transdermal Daily Hildred Priest, MD   21 mg at 08/29/16 0846  . norethindrone (MICRONOR,CAMILA,ERRIN) 0.35 MG tablet 0.35 mg  1 tablet Oral Daily Hildred Priest, MD      . polyethylene glycol (MIRALAX / GLYCOLAX) packet 17 g  17 g Oral Daily Hildred Priest, MD   17 g at 08/29/16 0847  . risperiDONE (RISPERDAL) tablet 1 mg  1 mg Oral QHS Hildred Priest, MD   1 mg at 08/29/16 2132  . tiotropium (SPIRIVA) inhalation capsule 18 mcg  18 mcg Inhalation Daily Clapacs, Madie Reno, MD   18 mcg at 08/30/16 [phone]  . tiZANidine (ZANAFLEX) tablet 2 mg  2 mg Oral TID Hildred Priest, MD   2 mg at 08/29/16 1722  . traMADol (ULTRAM) tablet 50 mg  50 mg Oral TID Clapacs, Madie Reno,  MD   50 mg at 08/29/16 1722  . venlafaxine (EFFEXOR) tablet 75  mg  75 mg Oral TID WC Hildred Priest, MD   75 mg at 08/29/16 1722    Lab Results:  No results found for this or any previous visit (from the past 48 hour(s)).  Blood Alcohol level:  Lab Results  Component Value Date   ETH <5 65/78/4696    Metabolic Disorder Labs: Lab Results  Component Value Date   HGBA1C 5.6 08/26/2016   MPG 114.02 08/26/2016   No results found for: PROLACTIN Lab Results  Component Value Date   CHOL 237 (H) 08/26/2016   TRIG 368 (H) 08/26/2016   HDL 38 (L) 08/26/2016   CHOLHDL 6.2 08/26/2016   VLDL 74 (H) 08/26/2016   LDLCALC 125 (H) 08/26/2016    Musculoskeletal: Strength & Muscle Tone: within normal limits Gait & Station: normal Patient leans: N/A  Psychiatric Specialty Exam: Physical Exam  Constitutional: She is oriented to person, place, and time. She appears well-developed and well-nourished.  HENT:  Head: Normocephalic and atraumatic.  Eyes: Conjunctivae and EOM are normal.  Neck: Normal range of motion.  Respiratory: Effort normal.  Musculoskeletal: Normal range of motion.  Neurological: She is alert and oriented to person, place, and time.    Review of Systems  Constitutional: Negative.  Negative for chills, fever, malaise/fatigue and weight loss.  HENT: Negative.  Negative for congestion, ear pain, hearing loss, sore throat and tinnitus.   Eyes: Negative.  Negative for blurred vision, double vision, photophobia and pain.  Respiratory: Negative.  Negative for cough, hemoptysis, sputum production, shortness of breath and wheezing.   Cardiovascular: Negative.  Negative for chest pain and palpitations.  Gastrointestinal: Negative.  Negative for heartburn, nausea and vomiting.  Genitourinary: Negative.  Negative for dysuria, flank pain, frequency, hematuria and urgency.  Musculoskeletal: Positive for back pain and neck pain.       Leg cramps  Skin: Negative.   Negative for itching and rash.  Neurological: Negative.  Negative for dizziness, tingling, tremors, seizures, loss of consciousness, weakness and headaches.  Endo/Heme/Allergies: Negative.  Negative for environmental allergies. Does not bruise/bleed easily.    Blood pressure 112/66, pulse 85, temperature 98.9 F (37.2 C), temperature source Oral, resp. rate 18, height 5' 4"  (1.626 m), weight 68.5 kg (151 lb), last menstrual period 08/23/2016, SpO2 96 %.Body mass index is 25.92 kg/m.  General Appearance: Well Groomed  Eye Contact:  Good  Speech:  Clear and Coherent  Volume:  Normal  Mood:  "I feel better"  Affect:  Interactive, full and congruent  Thought Process:  Linear and Descriptions of Associations: Intact  Orientation:  Full (Time, Place, and Person)  Thought Content:  Hallucinations: None  Suicidal Thoughts:  No  Homicidal Thoughts:  No  Memory:  Immediate;   Good Recent;   Good Remote;   Good  Judgement:  Fair  Insight:  Fair  Psychomotor Activity:  Decreased  Concentration:  Concentration: Fair and Attention Span: Fair  Recall:  [employer] of Knowledge:  Good  Language:  Good  Akathisia:  No  Handed:    AIMS (if indicated):     Assets:  Communication Skills Social Support  ADL's:  Intact  Cognition:  WNL  Sleep:  Number of Hours: 7.5     Treatment Plan Summary:   Kirsten Richardson Is a 41 year old Caucasian female with history of bipolar disorder, borderline personality disorder and chronic pain and present to the emergency room for suicidal thoughts and thoughts of hurting other people   Bipolar Disorder: Pt has been  started on Latuda 40 mg with supper but had problems with akathisia. Anette Guarneri was discontinued and the patient was started on Risperdal 1 mg by mouth nightly for mood stabilization. She is also currently on  Wellbutrin XL 150 mg a day. Lamictal has been discontinued and Effexor has been  decreased from 337.5 mg to 75 mg po TID with a plan to taper down off of  Effexor in the future.  ADHD patient claims she has been diagnosed with ADHD. Prior to admission she was on Ritalin. I will discontinue this medication and instead I will target her symptoms with wellbutrin  Borderline personality disorder. Patient tells me she is about to start group therapy at [facility]. She says sometimes she has troubles with transportation and these has prevented her from going to therapy more often  Chronic pain continue Voltaren gel 3 times a day, tramadol 50 mg 3 times a day. Neurontin 300 mg 3 times a day and Zanaflex 3 times a day. OxyContin has been discontinued as requested by patient  COPD continue with inhalers  Hyperlipidemia: The patient did have an elevated cholesterol of 237 and an elevated LDL. She will need to follow up with her PCP, Dr. Lisette Grinder after discharge. The patient will be changed to a heart healthy diet.  Seasonal allergies continue Flonase twice a day  Hypokalemia: Potassium has been replaced and is now within the normal limits   Urinary incontinence continue with Detrol LA (here in hospital she will receive Lisbeth Ply)  Constipation continue Moban take a MiraLAX daily  GERD continue Pepcid 20 mg a day  Labs: TSH, B12, hemoglobin A1 were within normal limits. Total cholesterol was elevated.  Patient is requesting hepatitis C to be checked--neg  [place] controlled substance data base has been checked per Dr Marlou Sa issues noted  Potential discharge early next week  Jay Schlichter, MD 08/30/2016, 8:33 AM

## 2016-08-30 NOTE — [facility] Group Notes (Signed)
[facility] LCSW Group Therapy  08/30/2016 2:07 PM  Type of Therapy:  Group Therapy  Participation Level:  Patient did not attend group. CSW invited patient to group.   Summary of Progress/Problems:  Marcelina Mclaurin G. Garnette Czech MSW, LCSWA 08/30/2016, 2:07 PM

## 2016-08-30 NOTE — [facility] Group Notes (Signed)
[facility] Group Notes:  (Nursing/MHT/Case Management/Adjunct)  Date:  08/30/2016  Time:  5:03 AM  Type of Therapy:  Psychoeducational Skills  Participation Level:  Active  Participation Quality:  Attentive  Affect:  Appropriate  Cognitive:  Appropriate  Insight:  Appropriate  Engagement in Group:  Engaged  Modes of Intervention:  Discussion, Socialization and Support  Summary of Progress/Problems:  Chancy Milroy 08/30/2016, 5:03 AM

## 2016-08-30 NOTE — Plan of Care (Signed)
Problem: Education: Goal: Knowledge of [place] [facility] Education information/materials will improve Outcome: Progressing Verbalized understanding of information received  Goal: Emotional status will improve Outcome: Progressing Mood has improved . Goal: Mental status will improve Outcome: Progressing Cognitively  Patient understands her sou rounding  Goal: Verbalization of understanding the information provided will improve Outcome: Progressing Patient able to  Verbalize understanding of information received   Problem: Coping: Goal: Ability to cope will improve Outcome: Progressing Continue to work on coping  Skills  Goal: Ability to verbalize feelings will improve Outcome: Progressing Attending unit programing , verbalizing feeling   Problem: Education: Goal: Ability to make informed decisions regarding treatment will improve Outcome: Adequate for Discharge Attending Treatment  Team  abler to  Be apart of the  Process   Problem: Activity: Goal: Sleeping patterns will improve Outcome: Progressing Sleeping has improved  during admission   Problem: Spiritual Needs Goal: Ability to function at adequate level Outcome: Progressing Patient  Attending unit programing  A groups   Problem: Education: Goal: Knowledge of [place] [facility] Education information/materials will improve Outcome: Adequate for Discharge Patient able to verbalize understanding of information  received   Problem: Safety: Goal: Ability to remain free from injury will improve Outcome: Progressing No injuries this admission   Problem: Health Behavior/Discharge Planning: Goal: Ability to manage health-related needs will improve Outcome: Progressing Patient able to identify her issues  And relate them in treatment  Team .   Problem: Pain Managment: Goal: General experience of comfort will improve Outcome: Progressing Patient remains to have  Pain concerns  That is  Met with medication and at  discharge  Pain clinic.

## 2016-08-31 MED ORDER — RISPERIDONE 1 MG PO TABS
2.0000 mg | ORAL_TABLET | Freq: Every day | ORAL | Status: DC
  Administered 2016-08-31: 2 mg via ORAL
  Filled 2016-08-31: qty 2

## 2016-08-31 NOTE — Plan of Care (Signed)
Problem: Education: Goal: Emotional status will improve Outcome: Progressing Has remains calm and cooperative. Interacting with staff and peers appropriately

## 2016-08-31 NOTE — Plan of Care (Signed)
Problem: Education: Goal: Verbalization of understanding the information provided will improve Outcome: Progressing Understands treatment plan and remains compliant

## 2016-08-31 NOTE — [facility] Group Notes (Signed)
[facility] LCSW Group Therapy  08/31/2016 2:45 PM  Type of Therapy:  Group Therapy  Participation Level:  Patient did not attend group. CSW invited patient to group.   Summary of Progress/Problems:  Kirsten Richardson. Garnette Czech MSW, LCSWA 08/31/2016, 2:45 PM

## 2016-08-31 NOTE — Plan of Care (Signed)
Problem: Coping: Goal: Ability to verbalize feelings will improve Outcome: Progressing Able to express her needs

## 2016-08-31 NOTE — Progress Notes (Signed)
Patient stayed in the milieu, attended group and was active. Pleasant and cooperative. Alert and oriented and denying suicidal/homicidal thoughts. Denying hallucinations. Presented to the medication room, had medications and reported that her mood is improving. Currently in bed sleeping and staff continue to monitor.

## 2016-08-31 NOTE — Progress Notes (Signed)
[facility] MD Progress Note  08/31/2016 2:25 PM Kirsten Richardson  MRN:  [id number] Subjective:  41 year old Caucasian female with history of bipolar disorder, borderline personality and chronic pain presented voluntarily to our emergency department on August 20 complaining of suicidal ideation and thoughts of hurting people.  Patient reported not having any specific target as far as school she will hurt. She reported having thoughts of slitting her throat. Patient has had a multitude of suicidal attempts by overdose before with the last one being more than 10 years ago.  Patient follows up with [facility] where she sees Dr. Jacqualine Code and she also follows up with pain management Dr. Primus Bravo in [place].  Patient reports that she has been compliant with all her medications. She complains that they are not working anymore. She feels that the Lamictal is making her feel numb. She was prescribed with Abilify too but due to side effects she discontinue this medication.  Patient says that Dr. Jacqualine Code has also been treated her with ADHD patients that she's been taking Ritalin 10 mg 4 times a day  Patient feels very depressed, and she has no energy and feels like sleeping all day. She has no desire to engaging in any pleasurable activities. She states her home alone all the time.  8/22 she still rates her depression as a 9 out of 10 x 10 being the worst. Says that she is not having suicidal thoughts as often as she was when she first came in but she still has to pay she denies having any homicidal thoughts. She feels less angry and agitated. She feels very tired, her energy is very low. She slept very well last night. Pain is slightly better than yesterday  8/23 patient says that mood wise she is doing better however she has been feeling like if she were withdrawing. She said this morning she was sweating and felt very anxious. Also she has experienced worsening of her back pain. In addition she noted significant  restlessness (while she was standing talking to me she wouldn't stop moving).  Patient feels that Wellbutrin and Latuda are helping. She also states that in the past she had tried Neurontin and she will like to try that medication again for back pain. She is on the OxyContin and she doesn't feel that medication is really helping much.  8/24 patient continues to have significant restlessness. She is constantly moving and cannot sit still. She  does not appear to be bothered by it much. She says that when she took Risperdal in the past has she did not have issues with restlessness. She wonders if we should just do Risperdal instead of Latuda. Other than that she feels that her mood has significantly improved. She is no longer suicidal or homicidal. She denies any auditory or visual hallucinations. She has been sleeping well. Her energy has improved. She met today with [facility] peers support specialist and she plans to follow up with them. Other than the restlessness the patient reports doing much better.   08/30/16 The patient reports that she slept fairly well last night, over 7 hours per nursing. She feels much better today now that she is no longer taking Latuda. She is glad that she was transitioned to Risperdal but also remembers being taken off Risperdal in the past because it was not effective. For now, she is willing to consider staying on Risperdal. He says her mood is "much better" and she is denying any current active or passive suicidal thoughts or  psychotic symptoms. The patient says she is learning mindfulness skills in group to help calm herself down when her anxiety and anger get triggered. She denies any problems with her appetite. The patient continues to report chronic neck and back pain improved with tramadol. Vital signs are stable. After discharge, the patient is planning to stay with a family friend. She wants to be able to get transportation so she can return to attending groups at  [facility].  Supportive psychotherapy provided and Times reviewing mindfulness skills to help with anger. The patient says her triggers or regulation when people do not listen to her.  08/31/16: The patient is complaining of worsening neck pain and left shoulder pain today. She is having the muscle relaxer will help. She is not having a good day and had some problems with crying spells today. She says that she all of a sudden feels claustrophobic in the hospital. She does try to attend groups and socializes with. The patient admits group this afternoon. She denies any current active or passive suicidal thoughts or psychotic symptoms. No auditory or visual hallucinations. No paranoid thoughts or delusions. Appetite is fairly good and she slept over 7 hours last night. She does however admit to depressive symptoms and is anxious about discharge. She is planning to live with a family friend when she leaves. She is tolerating psychotropic medications fairly well but worries that the Risperdal is not going to be as effective as the Lamictal. She is now wondering whether not she should be on the Lamictal again. Vital signs are stable. She does still have some restlessness and worries that it might be the Wellbutrin. She does not want to change medications however and said she did not want Cogentin.       Principal Problem: Bipolar 2 disorder, major depressive episode ([place]) Diagnosis:   Patient Active Problem List   Diagnosis Date Noted  . COPD (chronic obstructive pulmonary disease) ([place]) [J44.9] 08/26/2016  . Constipation [K59.00] 08/26/2016  . Bipolar 2 disorder, major depressive episode ([place]) [F31.81] 08/25/2016  . Essential hypertension [I10] 08/31/2015  . DDD (degenerative disc disease), lumbar [M51.36] 08/02/2015  . DDD (degenerative disc disease), cervical [M50.30] 08/02/2015  . 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 Hip pain, chronic [M25.559, G89.29] 09/27/2014  .  Borderline personality disorder [F60.3] 09/27/2014  . SUI (stress urinary incontinence, female) [N39.3] 01/02/2014   Total Time spent with patient: 20 minutes  Past Psychiatric History: Patient states she is being hospitalized multiple times per she's been diagnosed with bipolar disorder, ADHD, borderline personality disorder. She has had multiple suicidal attempts by overdose, at least 4 prior suicidal attempts. The last one was in 2010.    Past Medical History:  Past Medical History:  Diagnosis Date  . Anemia   . Arthritis   . Degenerative disc disease, lumbar   . Depression   . Emphysema of lung ([place])   . GERD (gastroesophageal reflux disease)   . Hypertension   . Plantar fasciitis   . Thyroid disease     Past Surgical History:  Procedure Laterality Date  . FOOT SURGERY Right    plantar fasciatis  . HIP FRACTURE SURGERY Bilateral    Family History:  Family History  Problem Relation Age of Onset  . Arthritis Mother   . COPD Mother   . Cancer Mother   . Depression Mother   . Early death Mother   . Vision loss Mother   . Mental illness Mother   .  Alcohol abuse Father   . Arthritis Father   . Cancer Father   . Diabetes Father   . Drug abuse Father   . Early death Father   . Vision loss Father   . Heart disease Father   . Hypertension Father   . Mental illness Father   . Stroke Father    Family Psychiatric  History: Reports that her sister and mother were diagnosed with depression. Her son was diagnosed with ADHD. She has nieces who suffer from mental illness but she doesn't know the diagnosis. Patient's father was an alcoholic  Social History: Patient is divorced. She lives by herself. She is disabled due to her medical issues. She has a GED education. She denies any history of legal problems in the past. She has 2 children and she helped raise her niece and nephew after her sister went to prison for killing  Husband.  Patient's sister is currently staying with  the patient temporarily History  Alcohol Use No     History  Drug Use No    Social History   Social History  . Marital status: Single    Spouse name: N/A  . Number of children: N/A  . Years of education: N/A   Social History Main Topics  . Smoking status: Former Smoker    Packs/day: 0.20    Types: Cigarettes    Quit date: 07/25/2015  . Smokeless tobacco: Never Used  . Alcohol use No  . Drug use: No  . Sexual activity: Yes    Birth control/ protection: Pill   Other Topics Concern  . None   Social History Narrative  . None     Current Medications: Current Facility-Administered Medications  Medication Dose Route Frequency Provider Last Rate Last Dose  . acetaminophen (TYLENOL) tablet 650 mg  650 mg Oral Q6H PRN Clapacs, Madie Reno, MD   650 mg at 08/31/16 1042  . albuterol (PROVENTIL HFA;VENTOLIN HFA) 108 (90 Base) MCG/ACT inhaler 2 puff  2 puff Inhalation Q4H PRN Clapacs, Madie Reno, MD   2 puff at 08/31/16 1317  . alum & mag hydroxide-simeth (MAALOX/MYLANTA) 200-200-20 MG/5ML suspension 30 mL  30 mL Oral Q4H PRN Clapacs, John T, MD      . buPROPion (WELLBUTRIN XL) 24 hr tablet 150 mg  150 mg Oral Daily Hildred Priest, MD   150 mg at 08/31/16 0743  . diclofenac sodium (VOLTAREN) 1 % transdermal gel 2 g  2 g Topical TID Hildred Priest, MD   2 g at 08/31/16 1140  . famotidine (PEPCID) tablet 20 mg  20 mg Oral Daily Clapacs, John T, MD   20 mg at 08/31/16 1140  . fesoterodine (TOVIAZ) tablet 4 mg  4 mg Oral Daily Hildred Priest, MD   4 mg at 08/31/16 0745  . fluticasone (FLONASE) 50 MCG/ACT nasal spray 2 spray  2 spray Each Nare Daily Hildred Priest, MD   2 spray at 08/31/16 0745  . gabapentin (NEURONTIN) tablet 300 mg  300 mg Oral TID Hildred Priest, MD   300 mg at 08/31/16 1137  . levothyroxine (SYNTHROID, LEVOTHROID) tablet 50 mcg  50 mcg Oral QAC breakfast Clapacs, Madie Reno, MD   50 mcg at 08/31/16 [phone]  . magnesium  hydroxide (MILK OF MAGNESIA) suspension 30 mL  30 mL Oral Daily PRN Clapacs, Madie Reno, MD      . mometasone-formoterol (DULERA) 200-5 MCG/ACT inhaler 2 puff  2 puff Inhalation BID Hildred Priest, MD   2 puff at 08/31/16  0745  . naloxegol oxalate (MOVANTIK) tablet 25 mg  25 mg Oral Daily Hildred Priest, MD   25 mg at 08/31/16 0745  . nicotine (NICODERM CQ - dosed in mg/24 hours) patch 21 mg  21 mg Transdermal Daily Hildred Priest, MD   21 mg at 08/31/16 0743  . norethindrone (MICRONOR,CAMILA,ERRIN) 0.35 MG tablet 0.35 mg  1 tablet Oral Daily Hildred Priest, MD      . polyethylene glycol (MIRALAX / GLYCOLAX) packet 17 g  17 g Oral Daily Hildred Priest, MD   17 g at 08/31/16 0743  . risperiDONE (RISPERDAL) tablet 2 mg  2 mg Oral QHS Chauncey Mann, MD      . tiotropium [facility]) inhalation capsule 18 mcg  18 mcg Inhalation Daily Clapacs, Madie Reno, MD   18 mcg at 08/31/16 0744  . tiZANidine (ZANAFLEX) tablet 2 mg  2 mg Oral TID Hildred Priest, MD   2 mg at 08/31/16 1137  . traMADol (ULTRAM) tablet 50 mg  50 mg Oral TID Clapacs, Madie Reno, MD   50 mg at 08/31/16 1138  . venlafaxine (EFFEXOR) tablet 75 mg  75 mg Oral TID WC Hildred Priest, MD   75 mg at 08/31/16 1137    Lab Results:  No results found for this or any previous visit (from the past 48 hour(s)).  Blood Alcohol level:  Lab Results  Component Value Date   ETH <5 82/99/3716    Metabolic Disorder Labs: Lab Results  Component Value Date   HGBA1C 5.6 08/26/2016   MPG 114.02 08/26/2016   No results found for: PROLACTIN Lab Results  Component Value Date   CHOL 237 (H) 08/26/2016   TRIG 368 (H) 08/26/2016   HDL 38 (L) 08/26/2016   CHOLHDL 6.2 08/26/2016   VLDL 74 (H) 08/26/2016   LDLCALC 125 (H) 08/26/2016    Musculoskeletal: Strength & Muscle Tone: within normal limits Gait & Station: normal Patient leans: N/A  Psychiatric Specialty Exam: Physical  Exam  Constitutional: She is oriented to person, place, and time. She appears well-developed and well-nourished.  HENT:  Head: Normocephalic and atraumatic.  Eyes: Conjunctivae and EOM are normal.  Neck: Normal range of motion.  Respiratory: Effort normal.  Musculoskeletal: Normal range of motion.  Neurological: She is alert and oriented to person, place, and time.    Review of Systems  Constitutional: Negative.  Negative for chills, fever, malaise/fatigue and weight loss.  HENT: Negative.  Negative for congestion, ear pain, hearing loss, sore throat and tinnitus.   Eyes: Negative.  Negative for blurred vision, double vision, photophobia and pain.  Respiratory: Negative.  Negative for cough, hemoptysis, sputum production, shortness of breath and wheezing.   Cardiovascular: Negative.  Negative for chest pain and palpitations.  Gastrointestinal: Negative.  Negative for heartburn, nausea and vomiting.  Genitourinary: Negative.  Negative for dysuria, flank pain, frequency, hematuria and urgency.  Musculoskeletal: Positive for back pain and neck pain.       Leg cramps  Skin: Negative.  Negative for itching and rash.  Neurological: Negative.  Negative for dizziness, tingling, tremors, seizures, loss of consciousness, weakness and headaches.  Endo/Heme/Allergies: Negative.  Negative for environmental allergies. Does not bruise/bleed easily.    Blood pressure 123/75, pulse 83, temperature 98.5 F (36.9 C), temperature source Oral, resp. rate 18, height _0  (1.626 m), weight 68.5 kg (151 lb), last menstrual period 08/23/2016, SpO2 96 %.Body mass index is 25.92 kg/m.  General Appearance: Well Groomed  Eye Contact:  Good  Speech:  Clear and Coherent  Volume:  Normal  Mood:  Depressed  Affect:  Depressed and Tearful  Thought Process:  Linear and Descriptions of Associations: Intact  Orientation:  Full (Time, Place, and Person)  Thought Content:  Hallucinations: None  Suicidal Thoughts:  No   Homicidal Thoughts:  No  Memory:  Immediate;   Good Recent;   Good Remote;   Good  Judgement:  Fair  Insight:  Fair  Psychomotor Activity:  Decreased  Concentration:  Concentration: Fair and Attention Span: Fair  Recall:  [place] of Knowledge:  Good  Language:  Good  Akathisia:  No  Handed:    AIMS (if indicated):     Assets:  Armed forces logistics/support/administrative officer Social Support  ADL's:  Intact  Cognition:  WNL  Sleep:  Number of Hours: 7.45     Treatment Plan Summary:   [name] Is a 41 year old Caucasian female with history of bipolar disorder, borderline personality disorder and chronic pain and present to the emergency room for suicidal thoughts and thoughts of hurting other people   Bipolar Disorder: Pt has been started on Latuda 40 mg with supper but had problems with akathisia. Anette Guarneri was discontinued and the patient was started on Risperdal for mood stabilization and will increase to 24m po nightly. She is also currently on  Wellbutrin XL 150 mg a da for depression Lamictal has been discontinued and Effexor has been  decreased from 337.5 mg to 75 mg po TID with a plan to taper down off of Effexor in the future.  ADHD patient claims she has been diagnosed with ADHD. Prior to admission she was on Ritalin. I will discontinue this medication and instead I will target her symptoms with wellbutrin  Borderline personality disorder. Patient tells me [facility] She says sometimes she has troubles with transportation and these has prevented her from going to therapy more often  Chronic pain continue Voltaren gel 3 times a day, tramadol 50 mg 3 times a day. Neurontin 300 mg 3 times a day and Zanaflex 3 times a day. OxyContin has been discontinued as requested by patient  COPD continue with inhalers  Hyperlipidemia: The patient did have an elevated cholesterol of 237 and an elevated LDL. She will need to follow up with her PCP, Dr. JLisette Grinderafter  discharge. The patient will be changed to a heart healthy diet.  Seasonal allergies continue Flonase twice a day  Hypokalemia: Potassium has been replaced and is now within the normal limits   Urinary incontinence continue with Detrol LA (here in hospital she will receive (Lisbeth Ply  Constipation continue Moban take a MiraLAX daily  GERD continue Pepcid 20 mg a day  Labs: TSH, B12, hemoglobin A1 were within normal limits. Total cholesterol was elevated.  Patient is requesting hepatitis C to be checked--neg  [place] controlled substance data base has been checked per Dr HMarlou Saissues noted  Potential discharge early next week. The patient plans to live with a family friends. She will need to followup with Dr WGilford Rileregarding hyperlipidemia  KJay Schlichter MD 08/31/2016, 2:25 PM

## 2016-08-31 NOTE — Plan of Care (Signed)
Problem: Education: Goal: Ability to make informed decisions regarding treatment will improve Outcome: Progressing Patient shows that ability to make informed decisions regarding treatment

## 2016-08-31 NOTE — Plan of Care (Signed)
Problem: Coping: Goal: Ability to cope will improve Outcome: Progressing Patient shows the ability to cope Goal: Ability to verbalize feelings will improve Outcome: Progressing Patient able to verbalize feelings to staff.  Problem: Education: Goal: Ability to make informed decisions regarding treatment will improve Outcome: Progressing Patient shows the ability to make informed decisions regarding treatment

## 2016-08-31 NOTE — Plan of Care (Signed)
Problem: Education: Goal: Mental status will improve Outcome: Progressing Alert and oriented    

## 2016-08-31 NOTE — [facility] Group Notes (Signed)
[facility] Group Notes:  (Nursing/MHT/Case Management/Adjunct)  Date:  08/31/2016  Time:  5:48 AM  Type of Therapy:  Psychoeducational Skills  Participation Level:  Active  Participation Quality:  Appropriate, Attentive and Sharing  Affect:  Appropriate  Cognitive:  Appropriate  Insight:  Appropriate and Good  Engagement in Group:  Engaged  Modes of Intervention:  Discussion, Socialization and Support  Summary of Progress/Problems:  Chancy Milroy 08/31/2016, 5:48 AM

## 2016-08-31 NOTE — Plan of Care (Signed)
Problem: Coping: Goal: Ability to cope will improve Outcome: Progressing Patient able t express her feelings pleasantly. Attending groups and supportive to peers

## 2016-08-31 NOTE — Progress Notes (Signed)
Patient is alert and oriented to person, place and time. Skin is warm, dry and intact. No limitations to all four extremities noted. Patient denies SI at this time. Patient presented to this writer earlier this afternoon very tearful stating, " I was writing in my journal and a rush of emotions just came over me, I think that I am having a panic attack." Nurse stayed with patient until she calmed down. Doctor in to see patient, new orders for Risperdal to be administered at HS. Patient was observed ambulating in hall during the shift with a steady gait. Attends meals and group with peer interaction noted. Milieu remains therapeutic. Patient will be monitored and physician notified of any acute changes.

## 2016-09-01 MED ORDER — GABAPENTIN 600 MG PO TABS: 300.0000 mg | ORAL_TABLET | Freq: Three times a day (TID) | ORAL | 0 refills | Status: DC

## 2016-09-01 MED ORDER — VENLAFAXINE HCL 75 MG PO TABS: 75.0000 mg | ORAL_TABLET | Freq: Three times a day (TID) | ORAL | 0 refills | Status: DC

## 2016-09-01 MED ORDER — BUPROPION HCL ER (XL) 150 MG PO TB24
150.0000 mg | ORAL_TABLET | Freq: Every day | ORAL | 0 refills | Status: DC
Start: 2016-09-02 — End: 2017-08-25

## 2016-09-01 MED ORDER — RISPERIDONE 2 MG PO TABS: 2.0000 mg | ORAL_TABLET | Freq: Every day | ORAL | 0 refills | Status: DC

## 2016-09-01 NOTE — Progress Notes (Signed)
Discharge note:  Patient discharged home per MD order.  Patient will follow up with [facility].  Patient received all personal belongings from unit and locker.  She received medications that were being held in the pharmacy.  Patient's prescriptions were called in by Dr. Ardyth Harps.  Patient received her inhalers and valtaren cream.  Reviewed AVS/transition record with patient and she indicated understanding.  Patient left ambulatory with a friend.

## 2016-09-01 NOTE — Progress Notes (Signed)
D: Pt passive SI/ AV[facility]- not command- music and bugs, pt contracts for safety. Pt is pleasant and cooperative. Pt stated she felt a lot better. Pt stated she felt a little stressed by being here "too long". Pt stated she was worried about having to move on sept 5th and needing to take care of things. Pt stated she thinks she needs to be on a mood stabilizer, pt was on one for 11 years but the doctor took her off , but pt would like to try another one possibly Depakote.   A: Pt was offered support and encouragement. Pt was given scheduled medications. Pt was encourage to attend groups. Q 15 minute checks were done for safety.   R: Pt is taking medication. Pt has no complaints.Pt receptive to treatment and safety maintained on unit.
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
## 2016-09-01 NOTE — Progress Notes (Signed)
Recreation Therapy Notes  Date: 08.27.18 Time: 1:00 pm Location: Craft Room  Group Topic: Wellness  Goal Area(s) Addresses:  Patient will identify at least one item per dimension of health. Patient will examine areas they are deficient in.  Behavioral Response: Did not attend  Intervention: 6 Dimensions of Health  Activity: Patients were given a definition sheet defining each dimension of health. Patients were given a worksheet with each dimension listed and were instructed to write things they were currently doing in each dimension.  Education: LRT educated patients on ways to improve each dimension.  Education Outcome: Patient did not attend group.  Clinical Observations/Feedback: Patient did not attend group.  Jacquelynn Cree, LRT/CTRS 09/01/2016 1:45 PM

## 2016-09-01 NOTE — Progress Notes (Signed)
Recreation Therapy Notes  INPATIENT RECREATION TR PLAN  Patient Details Name: Kirsten Richardson MRN: [id number] DOB: 06-14-1975 Today's Date: 09/01/2016  Rec Therapy Plan Is patient appropriate for Therapeutic Recreation?: Yes Treatment times per week: At least once a week TR Treatment/Interventions: 1:1 session, Group participation (Comment) (Appropriate participation in daily recreational therapy tx)  Discharge Criteria Pt will be discharged from therapy if:: Treatment goals are met, Discharged Treatment plan/goals/alternatives discussed and agreed upon by:: Patient/family  Discharge Summary Short term goals set: See Care Plan Short term goals met: Complete Progress toward goals comments: One-to-one attended Which groups?: Self-esteem, Coping skills, Leisure education, Other (Comment) (Self-expression) One-to-one attended: Self-esteem, stress management Reason goals not met: N/A Therapeutic equipment acquired: None Reason patient discharged from therapy: Discharge from hospital Pt/family agrees with progress & goals achieved: Yes Date patient discharged from therapy: 09/01/16   Leonette Monarch, LRT/CTRS 09/01/2016, 3:38 PM

## 2016-09-01 NOTE — Progress Notes (Signed)
  [facility] Adult Case Management Discharge Plan :  Will you be returning to the same living situation after discharge:  Yes,  returning home. At discharge, do you have transportation home?: Yes,  own transportation. Do you have the ability to pay for your medications: Yes,  Medicaid/Medicare  Release of information consent forms completed and in the chart;  Patient's signature needed at discharge.  Patient to Follow up at: Follow-up Information    [employer] Go on 09/03/2016.   Why:  Please plan to meet [facility] Peer Support Specialist, Unk Pinto on August 29th at [facility] for your initial intake assessment. Lorella Nimrod will pick you up. Call him when you discharge. Contact information: [address] [place] [address] [phone]           Next level of care provider has access to [facility] Link:no  Safety Planning and Suicide Prevention discussed: Yes,  friend, Loraine Leriche  Have you used any form of tobacco in the last 30 days? (Cigarettes, Smokeless Tobacco, Cigars, and/or Pipes): Yes  Has patient been referred to the Quitline?: Patient refused referral  Patient has been referred for addiction treatment: N/A  Lynden Oxford, LCSWA 09/01/2016, 11:01 AM

## 2016-09-01 NOTE — [facility] Group Notes (Signed)
[facility] LCSW Group Therapy Note  Date/Time: 09/01/16, 0930  Type of Therapy and Topic:  Group Therapy:  Overcoming Obstacles  Participation Level:  Did not attend  Description of Group:    In this group patients will be encouraged to explore what they see as obstacles to their own wellness and recovery. They will be guided to discuss their thoughts, feelings, and behaviors related to these obstacles. The group will process together ways to cope with barriers, with attention given to specific choices patients can make. Each patient will be challenged to identify changes they are motivated to make in order to overcome their obstacles. This group will be process-oriented, with patients participating in exploration of their own experiences as well as giving and receiving support and challenge from other group members.  Therapeutic Goals: 1. Patient will identify personal and current obstacles as they relate to admission. 2. Patient will identify barriers that currently interfere with their wellness or overcoming obstacles.  3. Patient will identify feelings, thought process and behaviors related to these barriers. 4. Patient will identify two changes they are willing to make to overcome these obstacles:    Summary of Patient Progress      Therapeutic Modalities:   Cognitive Behavioral Therapy Solution Focused Therapy Motivational Interviewing Relapse Prevention Therapy  Greg Mathias Bogacki, LCSW 

## 2016-09-17 ENCOUNTER — Other Ambulatory Visit: Payer: Self-pay | Admitting: Physical Medicine and Rehabilitation

## 2016-09-17 DIAGNOSIS — M5416 Radiculopathy, lumbar region: Secondary | ICD-10-CM

## 2016-09-24 ENCOUNTER — Ambulatory Visit
Admission: RE | Admit: 2016-09-24 | Discharge: 2016-09-24 | Disposition: A | Discharge status: Home / Self Care | Payer: Medicare Other | Source: Ambulatory Visit | Attending: Physical Medicine and Rehabilitation | Admitting: Physical Medicine and Rehabilitation

## 2016-09-24 DIAGNOSIS — M5416 Radiculopathy, lumbar region: Secondary | ICD-10-CM | POA: Diagnosis present

## 2016-09-24 DIAGNOSIS — M5117 Intervertebral disc disorders with radiculopathy, lumbosacral region: Secondary | ICD-10-CM | POA: Insufficient documentation

## 2017-04-01 ENCOUNTER — Encounter: Payer: Self-pay | Admitting: *Deleted

## 2017-04-02 ENCOUNTER — Ambulatory Visit: Payer: Medicare Other | Admitting: Certified Registered Nurse Anesthetist

## 2017-04-02 ENCOUNTER — Encounter: Payer: Self-pay | Admitting: *Deleted

## 2017-04-02 ENCOUNTER — Encounter
Admission: RE | Disposition: A | Discharge status: Home / Self Care | Payer: Self-pay | Source: Ambulatory Visit | Attending: Gastroenterology

## 2017-04-02 ENCOUNTER — Ambulatory Visit
Admission: RE | Admit: 2017-04-02 | Discharge: 2017-04-02 | Disposition: A | Discharge status: Home / Self Care | Payer: Medicare Other | Source: Ambulatory Visit | Attending: Gastroenterology | Admitting: Gastroenterology

## 2017-04-02 DIAGNOSIS — K5900 Constipation, unspecified: Secondary | ICD-10-CM | POA: Diagnosis present

## 2017-04-02 DIAGNOSIS — E039 Hypothyroidism, unspecified: Secondary | ICD-10-CM | POA: Diagnosis not present

## 2017-04-02 DIAGNOSIS — G894 Chronic pain syndrome: Secondary | ICD-10-CM | POA: Diagnosis not present

## 2017-04-02 DIAGNOSIS — Z888 Allergy status to other drugs, medicaments and biological substances status: Secondary | ICD-10-CM | POA: Insufficient documentation

## 2017-04-02 DIAGNOSIS — J439 Emphysema, unspecified: Secondary | ICD-10-CM | POA: Insufficient documentation

## 2017-04-02 DIAGNOSIS — Z79899 Other long term (current) drug therapy: Secondary | ICD-10-CM | POA: Diagnosis not present

## 2017-04-02 DIAGNOSIS — Z6839 Body mass index (BMI) 39.0-39.9, adult: Secondary | ICD-10-CM | POA: Insufficient documentation

## 2017-04-02 DIAGNOSIS — M1990 Unspecified osteoarthritis, unspecified site: Secondary | ICD-10-CM | POA: Diagnosis not present

## 2017-04-02 DIAGNOSIS — N393 Stress incontinence (female) (male): Secondary | ICD-10-CM | POA: Diagnosis not present

## 2017-04-02 DIAGNOSIS — I10 Essential (primary) hypertension: Secondary | ICD-10-CM | POA: Diagnosis not present

## 2017-04-02 DIAGNOSIS — K621 Rectal polyp: Secondary | ICD-10-CM | POA: Diagnosis not present

## 2017-04-02 DIAGNOSIS — M419 Scoliosis, unspecified: Secondary | ICD-10-CM | POA: Insufficient documentation

## 2017-04-02 DIAGNOSIS — F603 Borderline personality disorder: Secondary | ICD-10-CM | POA: Insufficient documentation

## 2017-04-02 DIAGNOSIS — K219 Gastro-esophageal reflux disease without esophagitis: Secondary | ICD-10-CM | POA: Insufficient documentation

## 2017-04-02 DIAGNOSIS — F319 Bipolar disorder, unspecified: Secondary | ICD-10-CM | POA: Diagnosis not present

## 2017-04-02 DIAGNOSIS — E538 Deficiency of other specified B group vitamins: Secondary | ICD-10-CM | POA: Insufficient documentation

## 2017-04-02 HISTORY — DX: Scoliosis, unspecified: M41.9

## 2017-04-02 HISTORY — DX: Bipolar disorder, unspecified: F31.9

## 2017-04-02 HISTORY — DX: Deficiency of other specified B group vitamins: E53.8

## 2017-04-02 HISTORY — DX: Carpal tunnel syndrome, unspecified upper limb: G56.00

## 2017-04-02 HISTORY — DX: Personal history of other diseases of the digestive system: Z87.19

## 2017-04-02 HISTORY — DX: Cervicalgia: M54.2

## 2017-04-02 HISTORY — PX: COLONOSCOPY WITH PROPOFOL: SHX5780

## 2017-04-02 HISTORY — DX: Dorsalgia, unspecified: M54.9

## 2017-04-02 HISTORY — DX: Hypothyroidism, unspecified: E03.9

## 2017-04-02 HISTORY — DX: Borderline personality disorder: F60.3

## 2017-04-02 HISTORY — DX: Stress incontinence (female) (male): N39.3

## 2017-04-02 HISTORY — DX: Dermatitis, unspecified: L30.9

## 2017-04-02 LAB — POCT PREGNANCY, URINE: Preg Test, Ur: NEGATIVE

## 2017-04-02 SURGERY — COLONOSCOPY WITH PROPOFOL
Anesthesia: General

## 2017-04-02 MED ORDER — SODIUM CHLORIDE 0.9 % IV SOLN: INTRAVENOUS | Status: DC

## 2017-04-02 MED ORDER — PROPOFOL 500 MG/50ML IV EMUL
INTRAVENOUS | Status: AC
  Filled 2017-04-02: qty 50

## 2017-04-02 MED ORDER — LIDOCAINE HCL (PF) 2 % IJ SOLN
INTRAMUSCULAR | Status: AC
  Filled 2017-04-02: qty 10

## 2017-04-02 MED ORDER — LIDOCAINE HCL (CARDIAC) 20 MG/ML IV SOLN
INTRAVENOUS | Status: DC | PRN
  Administered 2017-04-02: 50 mg via INTRAVENOUS

## 2017-04-02 MED ORDER — PROPOFOL 10 MG/ML IV BOLUS
INTRAVENOUS | Status: DC | PRN
  Administered 2017-04-02: 100 mg via INTRAVENOUS

## 2017-04-02 MED ORDER — PROPOFOL 500 MG/50ML IV EMUL
INTRAVENOUS | Status: DC | PRN
  Administered 2017-04-02: 135 ug/kg/min via INTRAVENOUS

## 2017-04-02 MED ORDER — SODIUM CHLORIDE 0.9 % IV SOLN
INTRAVENOUS | Status: DC
  Administered 2017-04-02: 13:00:00 via INTRAVENOUS

## 2017-04-02 MED ORDER — LIDOCAINE HCL (PF) 1 % IJ SOLN
INTRAMUSCULAR | Status: AC
  Filled 2017-04-02: qty 2

## 2017-04-02 NOTE — H&P (Signed)
Outpatient short stay form Pre-procedure 04/02/2017 1:51 PM Kirsten Deem MD  Primary Physician: Dr. Marcelino Duster  Reason for visit: Colonoscopy  History of present illness: Patient is a 42 year old female presenting today as above.  She has a degree of change of bowel habits with increasing problems with constipation.  It is of note she takes multiple medications for chronic pain syndrome that are likely involving this as well.  She has been started on some Movantik which has been of benefit for her.  She sees no blood in the stool occasional blood on toilet paper.    Current Facility-Administered Medications:  .  0.9 %  sodium chloride infusion, , Intravenous, Continuous, Kirsten Deem, MD .  0.9 %  sodium chloride infusion, , Intravenous, Continuous, Kirsten Deem, MD .  lidocaine (PF) (XYLOCAINE) 1 % injection, , , ,   Medications Prior to Admission  Medication Sig Dispense Refill Last Dose  . budesonide-formoterol (SYMBICORT) 160-4.5 MCG/ACT inhaler Inhale 2 puffs into the lungs 2 (two) times daily.   04/02/2017 at Unknown time  . buPROPion (WELLBUTRIN XL) 150 MG 24 hr tablet Take 1 tablet (150 mg total) by mouth daily. 30 tablet 0 04/01/2017 at Unknown time  . cetirizine (ZYRTEC) 10 MG tablet Take 10 mg by mouth daily.   04/01/2017 at Unknown time  . diclofenac sodium (VOLTAREN) 1 % GEL Apply topically 4 (four) times daily.   03/31/2017  . fluticasone (FLONASE) 50 MCG/ACT nasal spray SHAKE LIQUID AND USE 2 SPRAYS IN EACH NOSTRIL EVERY DAY AS NEEDED FOR RHINITIS OR ALLERGIES   04/02/2017 at Unknown time  . hydrochlorothiazide (HYDRODIURIL) 25 MG tablet Take 25 mg by mouth daily.   04/02/2017 at Unknown time  . ibuprofen (ADVIL,MOTRIN) 800 MG tablet Take 800 mg by mouth every 8 (eight) hours as needed.   04/01/2017 at Unknown time  . levonorgestrel-ethinyl estradiol (NORDETTE) 0.15-30 MG-MCG tablet Take 1 tablet by mouth daily.   04/01/2017 at Unknown time  . levothyroxine  (SYNTHROID, LEVOTHROID) 50 MCG tablet TAKE 1 TABLET(50 MCG) BY MOUTH EVERY DAY 30 TO 60 MINUTES BEFORE BREAKFAST ON AN EMPTY STOMACH AND WITH A GLASS OF WATER   04/01/2017 at Unknown time  . naloxegol oxalate (MOVANTIK) 12.5 MG TABS tablet Take 25 mg by mouth daily.   04/01/2017 at Unknown time  . omeprazole (PRILOSEC) 40 MG capsule Take 40 mg by mouth daily.   03/31/2017 at Unknown time  . oxyCODONE-acetaminophen (PERCOCET/ROXICET) 5-325 MG tablet Take 1 tablet by mouth as needed for pain.   03/31/2017  . polyethylene glycol powder (GLYCOLAX/MIRALAX) powder Take by mouth.   04/01/2017 at Unknown time  . risperiDONE (RISPERDAL) 2 MG tablet Take 1 tablet (2 mg total) by mouth at bedtime. 30 tablet 0 04/01/2017 at Unknown time  . tiZANidine (ZANAFLEX) 2 MG tablet LIMIT TO 2 TO 3 TS PO PER DAY IF TOLERATED  0 04/01/2017 at Unknown time  . tolterodine (DETROL LA) 2 MG 24 hr capsule TAKE ONE CAPSULE BY MOUTH ONCE DAILY   04/01/2017 at Unknown time  . traMADol (ULTRAM) 50 MG tablet Take 1 tablet (50 mg total) by mouth 3 (three) times daily. 90 tablet 0 04/01/2017 at Unknown time  . umeclidinium bromide (INCRUSE ELLIPTA) 62.5 MCG/INH AEPB Inhale 1 puff into the lungs daily.   04/02/2017 at Unknown time  . venlafaxine (EFFEXOR) 75 MG tablet Take 1 tablet (75 mg total) by mouth 3 (three) times daily with meals. 90 tablet 0 04/01/2017 at Unknown  time  . albuterol (PROVENTIL HFA;VENTOLIN HFA) 108 (90 Base) MCG/ACT inhaler Inhale into the lungs.   prn at prn  . budesonide-formoterol (SYMBICORT) 160-4.5 MCG/ACT inhaler Inhale 2 puffs into the lungs 2 (two) times daily.   03/31/2017  . clindamycin (CLEOCIN T) 1 % lotion Apply topically 2 (two) times daily as needed.   Not Taking at Unknown time  . famotidine (PEPCID) 20 MG tablet Take by mouth.   08/27/2016 at 1800  . gabapentin (NEURONTIN) 600 MG tablet Take 0.5 tablets (300 mg total) by mouth 3 (three) times daily. 45 tablet 0 04/01/2017  . norethindrone  (MICRONOR,CAMILA,ERRIN) 0.35 MG tablet Take by mouth.   Not Taking at Unknown time  . tiotropium (SPIRIVA) 18 MCG inhalation capsule Place 18 mcg into inhaler and inhale daily.    Not Taking at Unknown time  . valACYclovir (VALTREX) 1000 MG tablet Take 1,000 mg by mouth as needed.   Not Taking at Unknown time     Allergies  Allergen Reactions  . Linzess [Linaclotide] Nausea Only     Past Medical History:  Diagnosis Date  . Anemia   . Arthritis   . Back pain   . Bipolar disorder (HCC)   . Borderline personality disorder (HCC)   . Carpal tunnel syndrome   . Degenerative disc disease, lumbar   . Depression   . Eczema   . Emphysema of lung (HCC)   . Emphysema of lung (HCC)   . GERD (gastroesophageal reflux disease)   . History of hemorrhoids   . Hypertension   . Hypothyroidism   . Neck pain   . Plantar fasciitis   . Plantar fasciitis   . Scoliosis   . SUI (stress urinary incontinence, female)   . Thyroid disease   . Vitamin B 12 deficiency     Review of systems:      Physical Exam    Heart and lungs: Regular rate and rhythm without rub or gallop, lungs are bilaterally clear.    HEENT: Normocephalic atraumatic eyes are anicteric    Other:    Pertinant exam for procedure: Soft nontender nondistended bowel sounds positive normoactive    Planned proceedures: Colonoscopy and indicated procedures. I have discussed the risks benefits and complications of procedures to include not limited to bleeding, infection, perforation and the risk of sedation and the patient wishes to proceed.    Kirsten Deem, MD Gastroenterology 04/02/2017  1:51 PM

## 2017-04-02 NOTE — Transfer of Care (Signed)
Immediate Anesthesia Transfer of Care Note  Patient: Kirsten Richardson  Procedure(s) Performed: COLONOSCOPY WITH PROPOFOL (N/A )  Patient Location: PACU and Endoscopy Unit  Anesthesia Type:General  Level of Consciousness: awake, alert , oriented and patient cooperative  Airway & Oxygen Therapy: Patient Spontanous Breathing and Patient connected to nasal cannula oxygen  Post-op Assessment: Report given to RN and Post -op Vital signs reviewed and stable  Post vital signs: Reviewed and stable  Last Vitals:  Vitals Value Taken Time  BP 129/60 04/02/2017  2:24 PM  Temp    Pulse 78 04/02/2017  2:24 PM  Resp    SpO2 95 % 04/02/2017  2:24 PM  Vitals shown include unvalidated device data.  Last Pain:  Vitals:   04/02/17 1424  TempSrc: (P) Tympanic  PainSc:          Complications: No apparent anesthesia complications

## 2017-04-02 NOTE — Anesthesia Post-op Follow-up Note (Signed)
Anesthesia QCDR form completed.        

## 2017-04-02 NOTE — Anesthesia Preprocedure Evaluation (Signed)
Anesthesia Evaluation  Patient identified by MRN, date of birth, ID band Patient awake    Reviewed: Allergy & Precautions, H&P , NPO status , Patient's Chart, lab work & pertinent test results, reviewed documented beta blocker date and time   History of Anesthesia Complications Negative for: history of anesthetic complications  Airway Mallampati: III  TM Distance: >3 FB Neck ROM: full    Dental  (+) Edentulous Upper, Edentulous Lower   Pulmonary shortness of breath and with exertion, sleep apnea (likely based on history) , COPD,  COPD inhaler, neg recent URI, former smoker,           Cardiovascular Exercise Tolerance: Good hypertension, (-) angina(-) CAD, (-) Past MI, (-) Cardiac Stents and (-) CABG (-) dysrhythmias (-) Valvular Problems/Murmurs     Neuro/Psych PSYCHIATRIC DISORDERS Depression Bipolar Disorder negative neurological ROS     GI/Hepatic Neg liver ROS, GERD  ,  Endo/Other  neg diabetesHypothyroidism Morbid obesity  Renal/GU negative Renal ROS  negative genitourinary   Musculoskeletal   Abdominal   Peds  Hematology  (+) Blood dyscrasia, anemia ,   Anesthesia Other Findings Past Medical History: No date: Anemia No date: Arthritis No date: Back pain No date: Bipolar disorder (HCC) No date: Borderline personality disorder (HCC) No date: Carpal tunnel syndrome No date: Degenerative disc disease, lumbar No date: Depression No date: Eczema No date: Emphysema of lung (HCC) No date: Emphysema of lung (HCC) No date: GERD (gastroesophageal reflux disease) No date: History of hemorrhoids No date: Hypertension No date: Hypothyroidism No date: Neck pain No date: Plantar fasciitis No date: Plantar fasciitis No date: Scoliosis No date: SUI (stress urinary incontinence, female) No date: Thyroid disease No date: Vitamin B 12 deficiency   Reproductive/Obstetrics negative OB ROS                              Anesthesia Physical Anesthesia Plan  ASA: III  Anesthesia Plan: General   Post-op Pain Management:    Induction: Intravenous  PONV Risk Score and Plan: 3 and Propofol infusion  Airway Management Planned: Natural Airway and Nasal Cannula  Additional Equipment:   Intra-op Plan:   Post-operative Plan:   Informed Consent: I have reviewed the patients History and Physical, chart, labs and discussed the procedure including the risks, benefits and alternatives for the proposed anesthesia with the patient or authorized representative who has indicated his/her understanding and acceptance.   Dental Advisory Given  Plan Discussed with: Anesthesiologist, CRNA and Surgeon  Anesthesia Plan Comments:         Anesthesia Quick Evaluation

## 2017-04-02 NOTE — Op Note (Signed)
[facility] Gastroenterology Patient Name: Kirsten Richardson Procedure Date: 04/02/2017 1:33 PM MRN: [id number] Account #: [id number] Date of Birth: 15-Jun-1975 Admit Type: Outpatient Age: 42 Room: [facility] ENDO ROOM 1 Gender: Female Note Status: Finalized Procedure:            Colonoscopy Indications:          Constipation Providers:            Christena Deem, MD Referring MD:         Craig Guess, MD (Referring MD) Medicines:            Monitored Anesthesia Care Complications:        No immediate complications. Procedure:            Pre-Anesthesia Assessment:                       - ASA Grade Assessment: II - A patient with mild                        systemic disease.                       After obtaining informed consent, the colonoscope was                        passed under direct vision. Throughout the procedure,                        the patient's blood pressure, pulse, and oxygen                        saturations were monitored continuously. The                        Colonoscope was introduced through the anus and                        advanced to the the cecum, identified by appendiceal                        orifice and ileocecal valve. The colonoscopy was                        performed without difficulty. The patient tolerated the                        procedure well. The quality of the bowel preparation                        was good. Findings:      Two sessile polyps were found in the rectum. The polyps were less than 1       mm in size. These polyps were removed with a cold biopsy forceps.       Resection and retrieval were complete.      The exam was otherwise without abnormality.      The retroflexed view of the distal rectum and anal verge was normal and       showed no anal or rectal abnormalities.      The digital rectal exam was normal. Impression:           - Two less  than 1 mm polyps in the rectum, removed with     a cold biopsy forceps. Resected and retrieved.                       - The examination was otherwise normal.                       - The distal rectum and anal verge are normal on                        retroflexion view. Recommendation:       - Discharge patient to home.                       - Continue present medications.                       - Return to GI office in 3 weeks. Procedure Code(s):    --- Professional ---                       [phone], Colonoscopy, flexible; with biopsy, single or                        multiple Diagnosis Code(s):    --- Professional ---                       K62.1, Rectal polyp                       K59.00, Constipation, unspecified CPT copyright 2016 [employer] Medical Association. All rights reserved. The codes documented in this report are preliminary and upon coder review may  be revised to meet current compliance requirements. Christena Deem, MD 04/02/2017 2:26:04 PM This report has been signed electronically. Number of Addenda: 0 Note Initiated On: 04/02/2017 1:33 PM Scope Withdrawal Time: 0 hours 6 minutes 34 seconds  Total Procedure Duration: 0 hours 17 minutes 28 seconds       [facility]

## 2017-04-03 NOTE — Anesthesia Postprocedure Evaluation (Signed)
Anesthesia Post Note  Patient: Kirsten Richardson [facility]  Procedure(s) Performed: COLONOSCOPY WITH PROPOFOL (N/A )  Patient location during evaluation: Endoscopy Anesthesia Type: General Level of consciousness: awake and alert Pain management: pain level controlled Vital Signs Assessment: post-procedure vital signs reviewed and stable Respiratory status: spontaneous breathing, nonlabored ventilation, respiratory function stable and patient connected to nasal cannula oxygen Cardiovascular status: blood pressure returned to baseline and stable Postop Assessment: no apparent nausea or vomiting Anesthetic complications: no     Last Vitals:  Vitals:   04/02/17 1243 04/02/17 1424  BP: 132/78 129/60  Pulse: 84   Resp:  16  Temp: (!) 35.9 C (!) 36.2 C  SpO2: 99%     Last Pain:  Vitals:   04/03/17 0750  TempSrc:   PainSc: 0-No pain                 Lenard Simmer

## 2017-04-06 ENCOUNTER — Encounter: Payer: Self-pay | Admitting: Gastroenterology

## 2017-04-06 LAB — SURGICAL PATHOLOGY

## 2017-07-21 ENCOUNTER — Encounter
Admission: RE | Disposition: A | Discharge status: Home / Self Care | Payer: Self-pay | Source: Ambulatory Visit | Attending: Gastroenterology

## 2017-07-21 ENCOUNTER — Other Ambulatory Visit: Payer: Self-pay

## 2017-07-21 ENCOUNTER — Ambulatory Visit
Admission: RE | Admit: 2017-07-21 | Discharge: 2017-07-21 | Disposition: A | Discharge status: Home / Self Care | Payer: Medicare Other | Source: Ambulatory Visit | Attending: Gastroenterology | Admitting: Gastroenterology

## 2017-07-21 ENCOUNTER — Ambulatory Visit: Payer: Medicare Other | Admitting: Anesthesiology

## 2017-07-21 DIAGNOSIS — N393 Stress incontinence (female) (male): Secondary | ICD-10-CM | POA: Diagnosis not present

## 2017-07-21 DIAGNOSIS — M5136 Other intervertebral disc degeneration, lumbar region: Secondary | ICD-10-CM | POA: Diagnosis not present

## 2017-07-21 DIAGNOSIS — R110 Nausea: Secondary | ICD-10-CM | POA: Insufficient documentation

## 2017-07-21 DIAGNOSIS — M1990 Unspecified osteoarthritis, unspecified site: Secondary | ICD-10-CM | POA: Diagnosis not present

## 2017-07-21 DIAGNOSIS — E039 Hypothyroidism, unspecified: Secondary | ICD-10-CM | POA: Diagnosis not present

## 2017-07-21 DIAGNOSIS — Z7982 Long term (current) use of aspirin: Secondary | ICD-10-CM | POA: Insufficient documentation

## 2017-07-21 DIAGNOSIS — K259 Gastric ulcer, unspecified as acute or chronic, without hemorrhage or perforation: Secondary | ICD-10-CM | POA: Diagnosis not present

## 2017-07-21 DIAGNOSIS — Z79899 Other long term (current) drug therapy: Secondary | ICD-10-CM | POA: Insufficient documentation

## 2017-07-21 DIAGNOSIS — I10 Essential (primary) hypertension: Secondary | ICD-10-CM | POA: Diagnosis not present

## 2017-07-21 DIAGNOSIS — Z7951 Long term (current) use of inhaled steroids: Secondary | ICD-10-CM | POA: Diagnosis not present

## 2017-07-21 DIAGNOSIS — D649 Anemia, unspecified: Secondary | ICD-10-CM | POA: Insufficient documentation

## 2017-07-21 DIAGNOSIS — F319 Bipolar disorder, unspecified: Secondary | ICD-10-CM | POA: Diagnosis not present

## 2017-07-21 DIAGNOSIS — M419 Scoliosis, unspecified: Secondary | ICD-10-CM | POA: Insufficient documentation

## 2017-07-21 DIAGNOSIS — F603 Borderline personality disorder: Secondary | ICD-10-CM | POA: Insufficient documentation

## 2017-07-21 DIAGNOSIS — L309 Dermatitis, unspecified: Secondary | ICD-10-CM | POA: Insufficient documentation

## 2017-07-21 DIAGNOSIS — R1012 Left upper quadrant pain: Secondary | ICD-10-CM | POA: Diagnosis present

## 2017-07-21 DIAGNOSIS — E538 Deficiency of other specified B group vitamins: Secondary | ICD-10-CM | POA: Insufficient documentation

## 2017-07-21 DIAGNOSIS — K219 Gastro-esophageal reflux disease without esophagitis: Secondary | ICD-10-CM | POA: Insufficient documentation

## 2017-07-21 DIAGNOSIS — M722 Plantar fascial fibromatosis: Secondary | ICD-10-CM | POA: Diagnosis not present

## 2017-07-21 DIAGNOSIS — M549 Dorsalgia, unspecified: Secondary | ICD-10-CM | POA: Insufficient documentation

## 2017-07-21 DIAGNOSIS — Z6839 Body mass index (BMI) 39.0-39.9, adult: Secondary | ICD-10-CM | POA: Insufficient documentation

## 2017-07-21 DIAGNOSIS — J869 Pyothorax without fistula: Secondary | ICD-10-CM | POA: Diagnosis not present

## 2017-07-21 HISTORY — PX: ESOPHAGOGASTRODUODENOSCOPY (EGD) WITH PROPOFOL: SHX5813

## 2017-07-21 LAB — POCT PREGNANCY, URINE: Preg Test, Ur: NEGATIVE

## 2017-07-21 SURGERY — ESOPHAGOGASTRODUODENOSCOPY (EGD) WITH PROPOFOL
Anesthesia: General

## 2017-07-21 MED ORDER — GLYCOPYRROLATE 0.2 MG/ML IJ SOLN
INTRAMUSCULAR | Status: DC | PRN
  Administered 2017-07-21: 0.2 mg via INTRAVENOUS

## 2017-07-21 MED ORDER — PROPOFOL 10 MG/ML IV BOLUS
INTRAVENOUS | Status: DC | PRN
  Administered 2017-07-21: 30 mg via INTRAVENOUS
  Administered 2017-07-21: 70 mg via INTRAVENOUS

## 2017-07-21 MED ORDER — SODIUM CHLORIDE 0.9 % IV SOLN
INTRAVENOUS | Status: DC
  Administered 2017-07-21: 14:00:00 via INTRAVENOUS

## 2017-07-21 MED ORDER — LIDOCAINE 2% (20 MG/ML) 5 ML SYRINGE
INTRAMUSCULAR | Status: DC | PRN
  Administered 2017-07-21: 100 mg via INTRAVENOUS

## 2017-07-21 MED ORDER — PROPOFOL 500 MG/50ML IV EMUL
INTRAVENOUS | Status: DC | PRN
  Administered 2017-07-21: 200 ug/kg/min via INTRAVENOUS
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
## 2017-07-21 NOTE — Anesthesia Postprocedure Evaluation (Signed)
Anesthesia Post Note  Patient: Kirsten Richardson [facility]  Procedure(s) Performed: ESOPHAGOGASTRODUODENOSCOPY (EGD) WITH PROPOFOL (N/A )  Patient location during evaluation: Endoscopy Anesthesia Type: General Level of consciousness: awake and alert Pain management: pain level controlled Vital Signs Assessment: post-procedure vital signs reviewed and stable Respiratory status: spontaneous breathing, nonlabored ventilation, respiratory function stable and patient connected to nasal cannula oxygen Cardiovascular status: blood pressure returned to baseline and stable Postop Assessment: no apparent nausea or vomiting Anesthetic complications: no     Last Vitals:  Vitals:   07/21/17 1538 07/21/17 1548  BP: 104/66 122/79  Pulse: 93   Resp: 15   Temp:    SpO2: 100%     Last Pain:  Vitals:   07/21/17 1548  PainSc: 0-No pain                 Lenard Simmer

## 2017-07-21 NOTE — Anesthesia Post-op Follow-up Note (Signed)
Anesthesia QCDR form completed.        

## 2017-07-21 NOTE — Op Note (Signed)
[facility] Gastroenterology Patient Name: Kirsten Richardson Procedure Date: 07/21/2017 3:08 PM MRN: [id number] Account #: [id number] Date of Birth: 05-15-1975 Admit Type: Outpatient Age: 42 Room: [facility] ENDO ROOM 2 Gender: Female Note Status: Finalized Procedure:            Upper GI endoscopy Indications:          Abdominal pain in the left upper quadrant, Nausea Providers:            Christena Deem, MD Referring MD:         Craig Guess, MD (Referring MD) Medicines:            Monitored Anesthesia Care Complications:        No immediate complications. Procedure:            Pre-Anesthesia Assessment:                       - ASA Grade Assessment: III - A patient with severe                        systemic disease.                       After obtaining informed consent, the endoscope was                        passed under direct vision. Throughout the procedure,                        the patient's blood pressure, pulse, and oxygen                        saturations were monitored continuously. The Endoscope                        was introduced through the mouth, and advanced to the                        pylorus. The upper GI endoscopy was accomplished                        without difficulty. The patient tolerated the procedure                        well. Findings:      The examined esophagus was normal.      One non-bleeding cratered gastric ulcer with no stigmata of bleeding was       found on the posterior wall of the gastric antrum. The lesion was 8 mm       in largest dimension.      One non-bleeding cratered gastric ulcer with no stigmata of bleeding was       found at the incisura. The lesion was 8 mm in largest dimension.      One mildly friable to distension with air, cratered gastric ulcer with       no stigmata of bleeding was found at the pylorus. The lesion was 6 mm in       largest dimension.      I did not go past the pylorus due to the  channel ulcer. I did not do       biopsies due to  continued recent ASA use. No active bleeding throughout.       There is minimal evidence of gastritis with the excveption of the       incisura. Impression:           - Normal esophagus.                       - Non-bleeding gastric ulcer with no stigmata of                        bleeding.                       - Non-bleeding gastric ulcer with no stigmata of                        bleeding.                       - Mildly friable to distension with air, gastric ulcer                        with no stigmata of bleeding.                       - No specimens collected. Recommendation:       - Discharge patient to home.                       - Use Protonix (pantoprazole) 40 mg PO BID daily.                       - Use sucralfate tablets 1 gram PO QID for 1 month.                       - Repeat upper endoscopy in 7 days to check healing.                       - Clear liquid diet today, then advance as tolerated to                        soft diet for 1 week. Procedure Code(s):    --- Professional ---                       [phone], 52, Esophagogastroduodenoscopy, flexible,                        transoral; diagnostic, including collection of                        specimen(s) by brushing or washing, when performed                        (separate procedure) Diagnosis Code(s):    --- Professional ---                       K25.9, Gastric ulcer, unspecified as acute or chronic,                        without hemorrhage or perforation  R10.12, Left upper quadrant pain                       R11.0, Nausea CPT copyright 2017 [employer]. All rights reserved. The codes documented in this report are preliminary and upon coder review may  be revised to meet current compliance requirements. Christena Deem, MD 07/21/2017 3:37:12 PM This report has been signed electronically. Number of Addenda: 0 Note Initiated  On: 07/21/2017 3:08 PM      [facility]

## 2017-07-21 NOTE — Anesthesia Preprocedure Evaluation (Signed)
Anesthesia Evaluation  Patient identified by MRN, date of birth, ID band Patient awake    Reviewed: Allergy & Precautions, H&P , NPO status , Patient's Chart, lab work & pertinent test results, reviewed documented beta blocker date and time   History of Anesthesia Complications Negative for: history of anesthetic complications  Airway Mallampati: III  TM Distance: >3 FB Neck ROM: full    Dental  (+) Edentulous Upper, Edentulous Lower, Dental Advidsory Given   Pulmonary shortness of breath and with exertion, sleep apnea (likely based on history) , COPD,  COPD inhaler, neg recent URI, former smoker,           Cardiovascular Exercise Tolerance: Good hypertension, (-) angina(-) CAD, (-) Past MI, (-) Cardiac Stents and (-) CABG (-) dysrhythmias (-) Valvular Problems/Murmurs     Neuro/Psych PSYCHIATRIC DISORDERS Depression Bipolar Disorder negative neurological ROS     GI/Hepatic Neg liver ROS, GERD  ,  Endo/Other  neg diabetesHypothyroidism Morbid obesity  Renal/GU negative Renal ROS  negative genitourinary   Musculoskeletal   Abdominal   Peds  Hematology  (+) Blood dyscrasia, anemia ,   Anesthesia Other Findings Past Medical History: No date: Anemia No date: Arthritis No date: Back pain No date: Bipolar disorder (HCC) No date: Borderline personality disorder (HCC) No date: Carpal tunnel syndrome No date: Degenerative disc disease, lumbar No date: Depression No date: Eczema No date: Emphysema of lung (HCC) No date: Emphysema of lung (HCC) No date: GERD (gastroesophageal reflux disease) No date: History of hemorrhoids No date: Hypertension No date: Hypothyroidism No date: Neck pain No date: Plantar fasciitis No date: Plantar fasciitis No date: Scoliosis No date: SUI (stress urinary incontinence, female) No date: Thyroid disease No date: Vitamin B 12 deficiency   Reproductive/Obstetrics negative OB ROS                              Anesthesia Physical  Anesthesia Plan  ASA: III  Anesthesia Plan: General   Post-op Pain Management:    Induction: Intravenous  PONV Risk Score and Plan: 3 and Propofol infusion and TIVA  Airway Management Planned: Natural Airway and Nasal Cannula  Additional Equipment:   Intra-op Plan:   Post-operative Plan:   Informed Consent: I have reviewed the patients History and Physical, chart, labs and discussed the procedure including the risks, benefits and alternatives for the proposed anesthesia with the patient or authorized representative who has indicated his/her understanding and acceptance.   Dental Advisory Given  Plan Discussed with: Anesthesiologist, CRNA and Surgeon  Anesthesia Plan Comments:         Anesthesia Quick Evaluation

## 2017-07-21 NOTE — Transfer of Care (Signed)
Immediate Anesthesia Transfer of Care Note  Patient: [facility]  Procedure(s) Performed: ESOPHAGOGASTRODUODENOSCOPY (EGD) WITH PROPOFOL (N/A )  Patient Location: Endoscopy Unit  Anesthesia Type:General  Level of Consciousness: awake, alert  and oriented  Airway & Oxygen Therapy: Patient connected to nasal cannula oxygen  Post-op Assessment: Post -op Vital signs reviewed and stable  Post vital signs: stable  Last Vitals:  Vitals Value Taken Time  BP 126/91 07/21/2017  3:28 PM  Temp 36.1 C 07/21/2017  3:28 PM  Pulse 100 07/21/2017  3:28 PM  Resp 14 07/21/2017  3:28 PM  SpO2 99 % 07/21/2017  3:28 PM    Last Pain:  Vitals:   07/21/17 1528  PainSc: 8          Complications: No apparent anesthesia complications

## 2017-07-22 ENCOUNTER — Encounter: Payer: Self-pay | Admitting: Gastroenterology

## 2017-08-04 ENCOUNTER — Other Ambulatory Visit: Payer: Self-pay | Admitting: Podiatry

## 2017-08-04 DIAGNOSIS — M25572 Pain in left ankle and joints of left foot: Principal | ICD-10-CM

## 2017-08-04 DIAGNOSIS — G8929 Other chronic pain: Secondary | ICD-10-CM

## 2017-08-04 DIAGNOSIS — S93492A Sprain of other ligament of left ankle, initial encounter: Secondary | ICD-10-CM

## 2017-08-18 ENCOUNTER — Ambulatory Visit
Admission: RE | Admit: 2017-08-18 | Discharge: 2017-08-18 | Disposition: A | Discharge status: Home / Self Care | Payer: Medicare Other | Source: Ambulatory Visit | Attending: Podiatry | Admitting: Podiatry

## 2017-08-18 ENCOUNTER — Encounter: Payer: Self-pay | Admitting: Radiology

## 2017-08-18 DIAGNOSIS — S93492A Sprain of other ligament of left ankle, initial encounter: Secondary | ICD-10-CM

## 2017-08-18 DIAGNOSIS — M659 Synovitis and tenosynovitis, unspecified: Secondary | ICD-10-CM | POA: Diagnosis not present

## 2017-08-18 DIAGNOSIS — M25572 Pain in left ankle and joints of left foot: Secondary | ICD-10-CM

## 2017-08-18 DIAGNOSIS — G8929 Other chronic pain: Secondary | ICD-10-CM

## 2017-08-18 DIAGNOSIS — M25472 Effusion, left ankle: Secondary | ICD-10-CM | POA: Diagnosis not present

## 2017-08-19 ENCOUNTER — Emergency Department: Payer: Medicare Other

## 2017-08-19 ENCOUNTER — Other Ambulatory Visit: Payer: Self-pay

## 2017-08-19 ENCOUNTER — Encounter: Payer: Self-pay | Admitting: Emergency Medicine

## 2017-08-19 ENCOUNTER — Emergency Department
Admission: EM | Admit: 2017-08-19 | Discharge: 2017-08-19 | Disposition: A | Discharge status: Other Acute Inpatient Hospital | Payer: Medicare Other | Attending: Emergency Medicine | Admitting: Emergency Medicine

## 2017-08-19 ENCOUNTER — Inpatient Hospital Stay
Admission: AD | Admit: 2017-08-19 | Discharge: 2017-08-26 | DRG: 885 | Disposition: A | Discharge status: Home / Self Care | Payer: Medicare Other | Source: Ambulatory Visit | Attending: Psychiatry | Admitting: Psychiatry

## 2017-08-19 DIAGNOSIS — F3181 Bipolar II disorder: Secondary | ICD-10-CM | POA: Diagnosis present

## 2017-08-19 DIAGNOSIS — E876 Hypokalemia: Secondary | ICD-10-CM | POA: Diagnosis present

## 2017-08-19 DIAGNOSIS — R45851 Suicidal ideations: Secondary | ICD-10-CM | POA: Diagnosis present

## 2017-08-19 DIAGNOSIS — Z825 Family history of asthma and other chronic lower respiratory diseases: Secondary | ICD-10-CM

## 2017-08-19 DIAGNOSIS — E034 Atrophy of thyroid (acquired): Secondary | ICD-10-CM | POA: Diagnosis present

## 2017-08-19 DIAGNOSIS — Z9114 Patient's other noncompliance with medication regimen: Secondary | ICD-10-CM | POA: Diagnosis not present

## 2017-08-19 DIAGNOSIS — F909 Attention-deficit hyperactivity disorder, unspecified type: Secondary | ICD-10-CM | POA: Diagnosis present

## 2017-08-19 DIAGNOSIS — M419 Scoliosis, unspecified: Secondary | ICD-10-CM | POA: Diagnosis present

## 2017-08-19 DIAGNOSIS — E039 Hypothyroidism, unspecified: Secondary | ICD-10-CM | POA: Insufficient documentation

## 2017-08-19 DIAGNOSIS — Z888 Allergy status to other drugs, medicaments and biological substances status: Secondary | ICD-10-CM

## 2017-08-19 DIAGNOSIS — Z818 Family history of other mental and behavioral disorders: Secondary | ICD-10-CM | POA: Diagnosis not present

## 2017-08-19 DIAGNOSIS — Z598 Other problems related to housing and economic circumstances: Secondary | ICD-10-CM

## 2017-08-19 DIAGNOSIS — Z813 Family history of other psychoactive substance abuse and dependence: Secondary | ICD-10-CM

## 2017-08-19 DIAGNOSIS — Z833 Family history of diabetes mellitus: Secondary | ICD-10-CM

## 2017-08-19 DIAGNOSIS — Z811 Family history of alcohol abuse and dependence: Secondary | ICD-10-CM | POA: Diagnosis not present

## 2017-08-19 DIAGNOSIS — K259 Gastric ulcer, unspecified as acute or chronic, without hemorrhage or perforation: Secondary | ICD-10-CM | POA: Diagnosis present

## 2017-08-19 DIAGNOSIS — F17210 Nicotine dependence, cigarettes, uncomplicated: Secondary | ICD-10-CM | POA: Diagnosis present

## 2017-08-19 DIAGNOSIS — N390 Urinary tract infection, site not specified: Secondary | ICD-10-CM | POA: Diagnosis present

## 2017-08-19 DIAGNOSIS — Z9851 Tubal ligation status: Secondary | ICD-10-CM | POA: Diagnosis not present

## 2017-08-19 DIAGNOSIS — Z046 Encounter for general psychiatric examination, requested by authority: Secondary | ICD-10-CM | POA: Insufficient documentation

## 2017-08-19 DIAGNOSIS — M5030 Other cervical disc degeneration, unspecified cervical region: Secondary | ICD-10-CM | POA: Diagnosis present

## 2017-08-19 DIAGNOSIS — Z79891 Long term (current) use of opiate analgesic: Secondary | ICD-10-CM

## 2017-08-19 DIAGNOSIS — Z8379 Family history of other diseases of the digestive system: Secondary | ICD-10-CM

## 2017-08-19 DIAGNOSIS — Z7951 Long term (current) use of inhaled steroids: Secondary | ICD-10-CM

## 2017-08-19 DIAGNOSIS — K219 Gastro-esophageal reflux disease without esophagitis: Secondary | ICD-10-CM | POA: Diagnosis present

## 2017-08-19 DIAGNOSIS — Z79899 Other long term (current) drug therapy: Secondary | ICD-10-CM

## 2017-08-19 DIAGNOSIS — N393 Stress incontinence (female) (male): Secondary | ICD-10-CM | POA: Diagnosis present

## 2017-08-19 DIAGNOSIS — Z915 Personal history of self-harm: Secondary | ICD-10-CM

## 2017-08-19 DIAGNOSIS — R0981 Nasal congestion: Secondary | ICD-10-CM | POA: Insufficient documentation

## 2017-08-19 DIAGNOSIS — F603 Borderline personality disorder: Secondary | ICD-10-CM | POA: Diagnosis present

## 2017-08-19 DIAGNOSIS — Z793 Long term (current) use of hormonal contraceptives: Secondary | ICD-10-CM

## 2017-08-19 DIAGNOSIS — N3000 Acute cystitis without hematuria: Secondary | ICD-10-CM

## 2017-08-19 DIAGNOSIS — Z801 Family history of malignant neoplasm of trachea, bronchus and lung: Secondary | ICD-10-CM

## 2017-08-19 DIAGNOSIS — M5136 Other intervertebral disc degeneration, lumbar region: Secondary | ICD-10-CM | POA: Diagnosis present

## 2017-08-19 DIAGNOSIS — Z6838 Body mass index (BMI) 38.0-38.9, adult: Secondary | ICD-10-CM

## 2017-08-19 DIAGNOSIS — J439 Emphysema, unspecified: Secondary | ICD-10-CM | POA: Diagnosis present

## 2017-08-19 DIAGNOSIS — K279 Peptic ulcer, site unspecified, unspecified as acute or chronic, without hemorrhage or perforation: Secondary | ICD-10-CM

## 2017-08-19 DIAGNOSIS — F329 Major depressive disorder, single episode, unspecified: Secondary | ICD-10-CM | POA: Insufficient documentation

## 2017-08-19 DIAGNOSIS — Z823 Family history of stroke: Secondary | ICD-10-CM

## 2017-08-19 DIAGNOSIS — G8929 Other chronic pain: Secondary | ICD-10-CM | POA: Diagnosis present

## 2017-08-19 DIAGNOSIS — E6609 Other obesity due to excess calories: Secondary | ICD-10-CM

## 2017-08-19 DIAGNOSIS — I10 Essential (primary) hypertension: Secondary | ICD-10-CM | POA: Diagnosis present

## 2017-08-19 DIAGNOSIS — Z8261 Family history of arthritis: Secondary | ICD-10-CM

## 2017-08-19 DIAGNOSIS — J449 Chronic obstructive pulmonary disease, unspecified: Secondary | ICD-10-CM | POA: Diagnosis present

## 2017-08-19 DIAGNOSIS — Z87891 Personal history of nicotine dependence: Secondary | ICD-10-CM | POA: Insufficient documentation

## 2017-08-19 DIAGNOSIS — Z79890 Hormone replacement therapy (postmenopausal): Secondary | ICD-10-CM

## 2017-08-19 DIAGNOSIS — Z8249 Family history of ischemic heart disease and other diseases of the circulatory system: Secondary | ICD-10-CM

## 2017-08-19 LAB — COMPREHENSIVE METABOLIC PANEL
ALT: 17 U/L (ref 0–44)
AST: 21 U/L (ref 15–41)
Albumin: 4 g/dL (ref 3.5–5.0)
Alkaline Phosphatase: 51 U/L (ref 38–126)
Anion gap: 11 (ref 5–15)
BUN: 10 mg/dL (ref 6–20)
CO2: 30 mmol/L (ref 22–32)
Calcium: 9.7 mg/dL (ref 8.9–10.3)
Chloride: 99 mmol/L (ref 98–111)
Creatinine, Ser: 0.87 mg/dL (ref 0.44–1.00)
GFR calc Af Amer: >60 mL/min (ref >60)
GFR calc non Af Amer: >60 mL/min (ref >60)
Glucose, Bld: 105 mg/dL — ABNORMAL HIGH (ref 70–99)
Potassium: 2.9 mmol/L — ABNORMAL LOW (ref 3.5–5.1)
Sodium: 140 mmol/L (ref 135–145)
Total Bilirubin: 0.8 mg/dL (ref 0.3–1.2)
Total Protein: 7.2 g/dL (ref 6.5–8.1)

## 2017-08-19 LAB — URINALYSIS, COMPLETE (UACMP) WITH MICROSCOPIC
Bacteria, UA: NONE SEEN
Bilirubin Urine: NEGATIVE
Glucose, UA: NEGATIVE mg/dL
Hgb urine dipstick: NEGATIVE
Ketones, ur: NEGATIVE mg/dL
Leukocytes, UA: NEGATIVE
Nitrite: POSITIVE — AB
Protein, ur: NEGATIVE mg/dL
Specific Gravity, Urine: 1.004 — ABNORMAL LOW (ref 1.005–1.030)
WBC, UA: NONE SEEN WBC/hpf (ref 0–5)
pH: 7 (ref 5.0–8.0)

## 2017-08-19 LAB — URINE DRUG SCREEN, QUALITATIVE (ARMC ONLY)
Amphetamines, Ur Screen: NOT DETECTED
Barbiturates, Ur Screen: NOT DETECTED
Cannabinoid 50 Ng, Ur ~~LOC~~: NOT DETECTED
Cocaine Metabolite,Ur ~~LOC~~: NOT DETECTED
MDMA (Ecstasy)Ur Screen: NOT DETECTED
Methadone Scn, Ur: NOT DETECTED
Opiate, Ur Screen: NOT DETECTED
Phencyclidine (PCP) Ur S: NOT DETECTED
Tricyclic, Ur Screen: NOT DETECTED

## 2017-08-19 LAB — CBC
HCT: 34 % — ABNORMAL LOW (ref 35.0–47.0)
Hemoglobin: 11.5 g/dL — ABNORMAL LOW (ref 12.0–16.0)
MCH: 31.1 pg (ref 26.0–34.0)
MCHC: 33.7 g/dL (ref 32.0–36.0)
MCV: 92.2 fL (ref 80.0–100.0)
Platelets: 587 10*3/uL — ABNORMAL HIGH (ref 150–440)
RBC: 3.69 MIL/uL — ABNORMAL LOW (ref 3.80–5.20)
RDW: 19.3 % — ABNORMAL HIGH (ref 11.5–14.5)
WBC: 27.3 10*3/uL — ABNORMAL HIGH (ref 3.6–11.0)

## 2017-08-19 LAB — POCT PREGNANCY, URINE: Preg Test, Ur: NEGATIVE

## 2017-08-19 LAB — SALICYLATE LEVEL: Salicylate Lvl: <7 mg/dL (ref 2.8–30.0)

## 2017-08-19 LAB — ACETAMINOPHEN LEVEL: Acetaminophen (Tylenol), Serum: <10 ug/mL — ABNORMAL LOW (ref 10–30)

## 2017-08-19 LAB — ETHANOL: Alcohol, Ethyl (B): <10 mg/dL (ref <10)

## 2017-08-19 MED ORDER — SUCRALFATE 1 G PO TABS
1.0000 g | ORAL_TABLET | Freq: Three times a day (TID) | ORAL | Status: DC
  Administered 2017-08-19: 1 g via ORAL
  Filled 2017-08-19: qty 1

## 2017-08-19 MED ORDER — VENLAFAXINE HCL 37.5 MG PO TABS
75.0000 mg | ORAL_TABLET | Freq: Three times a day (TID) | ORAL | Status: DC
  Administered 2017-08-20: 75 mg via ORAL
  Filled 2017-08-19 (×2): qty 2

## 2017-08-19 MED ORDER — PANTOPRAZOLE SODIUM 40 MG PO TBEC
40.0000 mg | DELAYED_RELEASE_TABLET | Freq: Two times a day (BID) | ORAL | Status: DC
  Administered 2017-08-19: 40 mg via ORAL
  Filled 2017-08-19: qty 1

## 2017-08-19 MED ORDER — HYDROCHLOROTHIAZIDE 25 MG PO TABS: 25.0000 mg | ORAL_TABLET | Freq: Every day | ORAL | Status: DC

## 2017-08-19 MED ORDER — HYDROCHLOROTHIAZIDE 25 MG PO TABS
25.0000 mg | ORAL_TABLET | Freq: Every day | ORAL | Status: DC
  Administered 2017-08-20 – 2017-08-26 (×7): 25 mg via ORAL
  Filled 2017-08-19 (×7): qty 1

## 2017-08-19 MED ORDER — TRAMADOL HCL 50 MG PO TABS: 50.0000 mg | ORAL_TABLET | Freq: Four times a day (QID) | ORAL | Status: DC | PRN

## 2017-08-19 MED ORDER — BUPROPION HCL ER (XL) 150 MG PO TB24
150.0000 mg | ORAL_TABLET | Freq: Every day | ORAL | Status: DC
  Administered 2017-08-20: 150 mg via ORAL
  Filled 2017-08-19: qty 1

## 2017-08-19 MED ORDER — TIOTROPIUM BROMIDE MONOHYDRATE 18 MCG IN CAPS
18.0000 ug | ORAL_CAPSULE | Freq: Every day | RESPIRATORY_TRACT | Status: DC
  Administered 2017-08-20 – 2017-08-26 (×7): 18 ug via RESPIRATORY_TRACT
  Filled 2017-08-19: qty 5

## 2017-08-19 MED ORDER — CEPHALEXIN 500 MG PO CAPS
500.0000 mg | ORAL_CAPSULE | Freq: Two times a day (BID) | ORAL | Status: DC
  Administered 2017-08-19: 500 mg via ORAL
  Filled 2017-08-19: qty 1

## 2017-08-19 MED ORDER — PANTOPRAZOLE SODIUM 40 MG PO TBEC
40.0000 mg | DELAYED_RELEASE_TABLET | Freq: Two times a day (BID) | ORAL | Status: DC
  Filled 2017-08-19: qty 1

## 2017-08-19 MED ORDER — POLYETHYLENE GLYCOL 3350 17 G PO PACK: 17.0000 g | PACK | Freq: Every day | ORAL | Status: DC

## 2017-08-19 MED ORDER — ACETAMINOPHEN 500 MG PO TABS
1000.0000 mg | ORAL_TABLET | ORAL | Status: AC
  Administered 2017-08-19: 1000 mg via ORAL
  Filled 2017-08-19: qty 2

## 2017-08-19 MED ORDER — GABAPENTIN 300 MG PO CAPS
300.0000 mg | ORAL_CAPSULE | Freq: Three times a day (TID) | ORAL | Status: DC
  Administered 2017-08-19: 300 mg via ORAL
  Filled 2017-08-19: qty 1

## 2017-08-19 MED ORDER — LORATADINE 10 MG PO TABS
10.0000 mg | ORAL_TABLET | Freq: Every day | ORAL | Status: DC
  Administered 2017-08-20 – 2017-08-26 (×7): 10 mg via ORAL
  Filled 2017-08-19 (×7): qty 1

## 2017-08-19 MED ORDER — BUPROPION HCL ER (XL) 150 MG PO TB24
150.0000 mg | ORAL_TABLET | Freq: Every day | ORAL | Status: DC
  Administered 2017-08-19: 150 mg via ORAL
  Filled 2017-08-19: qty 1

## 2017-08-19 MED ORDER — FLUTICASONE PROPIONATE 50 MCG/ACT NA SUSP
2.0000 | Freq: Every day | NASAL | Status: DC
  Administered 2017-08-20 – 2017-08-26 (×7): 2 via NASAL
  Filled 2017-08-19: qty 16

## 2017-08-19 MED ORDER — TIOTROPIUM BROMIDE MONOHYDRATE 18 MCG IN CAPS
18.0000 ug | ORAL_CAPSULE | Freq: Every day | RESPIRATORY_TRACT | Status: DC
  Filled 2017-08-19: qty 5

## 2017-08-19 MED ORDER — TRAMADOL HCL 50 MG PO TABS
50.0000 mg | ORAL_TABLET | Freq: Four times a day (QID) | ORAL | Status: DC | PRN
  Administered 2017-08-19 – 2017-08-26 (×8): 50 mg via ORAL
  Filled 2017-08-19 (×8): qty 1

## 2017-08-19 MED ORDER — RISPERIDONE 1 MG PO TABS
2.0000 mg | ORAL_TABLET | Freq: Every day | ORAL | Status: DC
  Administered 2017-08-20 – 2017-08-25 (×6): 2 mg via ORAL
  Filled 2017-08-19 (×6): qty 2

## 2017-08-19 MED ORDER — CEPHALEXIN 500 MG PO CAPS
500.0000 mg | ORAL_CAPSULE | Freq: Two times a day (BID) | ORAL | Status: AC
  Administered 2017-08-20 – 2017-08-22 (×5): 500 mg via ORAL
  Filled 2017-08-19 (×5): qty 1

## 2017-08-19 MED ORDER — ALUM & MAG HYDROXIDE-SIMETH 200-200-20 MG/5ML PO SUSP: 30.0000 mL | ORAL | Status: DC | PRN

## 2017-08-19 MED ORDER — ALBUTEROL SULFATE HFA 108 (90 BASE) MCG/ACT IN AERS
2.0000 | INHALATION_SPRAY | Freq: Four times a day (QID) | RESPIRATORY_TRACT | Status: DC | PRN
  Administered 2017-08-20 – 2017-08-24 (×2): 2 via RESPIRATORY_TRACT
  Filled 2017-08-19: qty 6.7

## 2017-08-19 MED ORDER — NICOTINE 21 MG/24HR TD PT24
21.0000 mg | MEDICATED_PATCH | Freq: Once | TRANSDERMAL | Status: DC
  Administered 2017-08-19: 21 mg via TRANSDERMAL
  Filled 2017-08-19: qty 1

## 2017-08-19 MED ORDER — VENLAFAXINE HCL 37.5 MG PO TABS
75.0000 mg | ORAL_TABLET | Freq: Three times a day (TID) | ORAL | Status: DC
  Filled 2017-08-19: qty 2

## 2017-08-19 MED ORDER — MAGNESIUM HYDROXIDE 400 MG/5ML PO SUSP: 30.0000 mL | Freq: Every day | ORAL | Status: DC | PRN

## 2017-08-19 MED ORDER — ACETAMINOPHEN 325 MG PO TABS
650.0000 mg | ORAL_TABLET | Freq: Four times a day (QID) | ORAL | Status: DC | PRN
  Administered 2017-08-20 (×2): 650 mg via ORAL
  Filled 2017-08-19 (×2): qty 2

## 2017-08-19 MED ORDER — NICOTINE 21 MG/24HR TD PT24
21.0000 mg | MEDICATED_PATCH | Freq: Once | TRANSDERMAL | Status: DC
  Filled 2017-08-19: qty 1

## 2017-08-19 MED ORDER — RISPERIDONE 1 MG PO TABS
2.0000 mg | ORAL_TABLET | Freq: Every day | ORAL | Status: DC
  Administered 2017-08-19: 2 mg via ORAL
  Filled 2017-08-19: qty 2

## 2017-08-19 MED ORDER — BUDESONIDE 0.25 MG/2ML IN SUSP: 2.0000 mL | Freq: Two times a day (BID) | RESPIRATORY_TRACT | Status: DC

## 2017-08-19 MED ORDER — GABAPENTIN 300 MG PO CAPS
300.0000 mg | ORAL_CAPSULE | Freq: Three times a day (TID) | ORAL | Status: DC
  Administered 2017-08-20 – 2017-08-26 (×19): 300 mg via ORAL
  Filled 2017-08-19 (×18): qty 1

## 2017-08-19 MED ORDER — LEVOTHYROXINE SODIUM 50 MCG PO TABS: 50.0000 ug | ORAL_TABLET | Freq: Every day | ORAL | Status: DC

## 2017-08-19 MED ORDER — TRAZODONE HCL 100 MG PO TABS
100.0000 mg | ORAL_TABLET | Freq: Every evening | ORAL | Status: DC | PRN
  Administered 2017-08-19 – 2017-08-25 (×6): 100 mg via ORAL
  Filled 2017-08-19 (×6): qty 1

## 2017-08-19 MED ORDER — LORATADINE 10 MG PO TABS: 10.0000 mg | ORAL_TABLET | Freq: Every day | ORAL | Status: DC

## 2017-08-19 MED ORDER — ALBUTEROL SULFATE (2.5 MG/3ML) 0.083% IN NEBU: 2.5000 mg | INHALATION_SOLUTION | Freq: Four times a day (QID) | RESPIRATORY_TRACT | Status: DC | PRN

## 2017-08-19 MED ORDER — SUCRALFATE 1 G PO TABS
1.0000 g | ORAL_TABLET | Freq: Three times a day (TID) | ORAL | Status: DC
  Administered 2017-08-20 – 2017-08-26 (×23): 1 g via ORAL
  Filled 2017-08-19 (×28): qty 1

## 2017-08-19 MED ORDER — MOMETASONE FURO-FORMOTEROL FUM 200-5 MCG/ACT IN AERO
2.0000 | INHALATION_SPRAY | Freq: Two times a day (BID) | RESPIRATORY_TRACT | Status: DC
  Administered 2017-08-19 – 2017-08-26 (×13): 2 via RESPIRATORY_TRACT
  Filled 2017-08-19: qty 8.8

## 2017-08-19 MED ORDER — NICOTINE 21 MG/24HR TD PT24
21.0000 mg | MEDICATED_PATCH | Freq: Once | TRANSDERMAL | Status: AC
  Administered 2017-08-20 – 2017-08-21 (×2): 21 mg via TRANSDERMAL
  Filled 2017-08-19: qty 1

## 2017-08-19 NOTE — ED Notes (Signed)
Report off to john rn.   

## 2017-08-19 NOTE — ED Triage Notes (Signed)
Patient reports that she was at her PCP today and had written out a letter to tell him everything she wanted to say. She states that she is fed up with her poor health and that if she can't get answers and start losing weight she is going to kill herself. Patient states that she has had multiple attempts taking pills and that this time she would "just slit my throat to make sure I got it done". Patient on multiple psychiatric meds and states that she ran out of medications on Sunday and just got them refilled and started taking them yesterday.

## 2017-08-19 NOTE — BH Assessment (Signed)
Patient is to be admitted to [facility] by Dr. Toni Amend.  Attending Physician will be Dr. Johnella Moloney.   Patient has been assigned to room 309, by [facility] Charge Nurse [employer].   Intake Paper Work has been signed and placed on patient chart.  ER staff is aware of the admission:  [facility] ER Kathrynn Speed   Dr. Fanny Bien, ER MD   Matt/ Jonny Ruiz Patient's Nurse   Clarise Cruz Patient Access.

## 2017-08-19 NOTE — ED Notes (Signed)
Patient dressed out by this RN and Misty Stanley, EDT.

## 2017-08-19 NOTE — Consult Note (Signed)
[facility] Face-to-Face Psychiatry Consult   Reason for Consult: Consult for this 42 year old woman with a history of bipolar disorder type II comes in with suicidal thoughts Referring Physician: Quale Patient Identification: Kirsten Richardson MRN:  [id number] Principal Diagnosis: Bipolar 2 disorder, major depressive episode [employer]) Diagnosis:   Patient Active Problem List   Diagnosis Date Noted  . Peptic ulcer [K27.9] 08/19/2017  . COPD (chronic obstructive pulmonary disease) ([place]) [J44.9] 08/26/2016  . Constipation [K59.00] 08/26/2016  . Bipolar 2 disorder, major depressive episode ([place]) [F31.81] 08/25/2016  . Essential hypertension [I10] 08/31/2015  . DDD (degenerative disc disease), lumbar [M51.36] 08/02/2015  . DDD (degenerative disc disease), cervical [M50.30] 08/02/2015  . 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 Hip pain, chronic [M25.559, G89.29] 09/27/2014  . Borderline personality disorder ([place]) [F60.3] 09/27/2014  . SUI (stress urinary incontinence, female) [N39.3] 01/02/2014    Total Time spent with patient: 1 hour  Subjective:   Kirsten Richardson is a 42 y.o. female patient admitted with "I have been feeling pretty bad".  HPI: Patient seen chart reviewed.  Patient went to her primary care doctor today and handed him a note with a list of all of her complaints and it included suicidal ideation.  Patient says she is mostly frustrated about her chronic medical problems but her mood has been significantly more depressed.  Stays down depressed and hopeless most of the time.  Sleeps okay.  Eats okay.  Worries a lot about her physical health which is her biggest stress.  Especially her weight gain and her severe COPD.  Patient says she does take her medicine prescribed by Dr. Randel Books but that she missed it for 3 days recently.  She says last week she had serious suicidal thoughts and had to be talked out of killing herself by her peers support worker.   Continues to have suicidal thoughts with thoughts of cutting herself or overdosing on medicine.  Not having hallucinations or psychotic thoughts.  Not abusing drugs or alcohol.  Medical history: Multiple medical problems including chronic pain, hypertension, hypothyroidism, severe COPD and now stomach problems with possible ulcers.  Social history: Patient is currently staying with her ex-husband which is a bit of stress.  Recently broke up with her boyfriend.  Not able to work.  Substance abuse history: Denies any alcohol or drug abuse  Past Psychiatric History: Patient has a long history of mood disorder and has had several prior suicide attempts.  Has been on multiple antidepressants antipsychotics and mood stabilizers but usually with only transient benefit.  Also has been prescribed medicine for ADHD in the past.  Currently seeing Dr. Jacqualine Code at [facility].  Risk to Self:   Risk to Others:   Prior Inpatient Therapy:   Prior Outpatient Therapy:    Past Medical History:  Past Medical History:  Diagnosis Date  . Anemia   . Arthritis   . Back pain   . Bipolar disorder ([place])   . Borderline personality disorder ([place])   . Carpal tunnel syndrome   . Degenerative disc disease, lumbar   . Depression   . Eczema   . Emphysema of lung ([place])   . Emphysema of lung ([place])   . GERD (gastroesophageal reflux disease)   . History of hemorrhoids   . Hypertension   . Hypothyroidism   . Neck pain   . Plantar fasciitis   . Plantar fasciitis   . Scoliosis   . SUI (stress urinary incontinence, female)   .  Thyroid disease   . Vitamin B 12 deficiency     Past Surgical History:  Procedure Laterality Date  . CARPAL TUNNEL RELEASE Right 2018   then left done a few weeks later  . COLONOSCOPY WITH PROPOFOL N/A 04/02/2017   Procedure: COLONOSCOPY WITH PROPOFOL;  Surgeon: Lollie Sails, MD;  Location[facility] END[facility]COPY;  Service: Endoscopy;  Laterality: N/A;  . ESOPHAGOGASTRODUODENOSCOPY (EGD) WITH  PROPOFOL N/A 07/21/2017   Procedure: ESOPHAGOGASTRODUODENOSCOPY (EGD) WITH PROPOFOL;  Surgeon: Lollie Sails, MD;  Location: [facility] ENDOSCOPY;  Service: Endoscopy;  Laterality: N/A;  . FOOT SURGERY Right    plantar fasciatis  . HIP FRACTURE SURGERY Bilateral   . TUBAL LIGATION    . WISDOM TOOTH EXTRACTION  09/2005   Family History:  Family History  Problem Relation Age of Onset  . Arthritis Mother   . COPD Mother   . Cancer Mother   . Depression Mother   . Early death Mother   . Vision loss Mother   . Mental illness Mother   . Alcohol abuse Father   . Arthritis Father   . Cancer Father   . Diabetes Father   . Drug abuse Father   . Early death Father   . Vision loss Father   . Heart disease Father   . Hypertension Father   . Mental illness Father   . Stroke Father   . Lung cancer Sister   . Pancreatitis Brother   . Hypertension Brother   . Diabetes Brother   . Diverticulitis Brother   . Cirrhosis Brother   . Hypertension Paternal Grandmother   . Heart disease Paternal Grandmother    Family Psychiatric  History: He says her sister and father both had serious mood problems Social History:  Social History   Substance and Sexual Activity  Alcohol Use No  . Alcohol/week: 0.0 standard drinks     Social History   Substance and Sexual Activity  Drug Use No    Social History   Socioeconomic History  . Marital status: Single    Spouse name: Not on file  . Number of children: Not on file  . Years of education: Not on file  . Highest education level: Not on file  Occupational History  . Not on file  Social Needs  . Financial resource strain: Not on file  . Food insecurity:    Worry: Not on file    Inability: Not on file  . Transportation needs:    Medical: Not on file    Non-medical: Not on file  Tobacco Use  . Smoking status: Former Smoker    Packs/day: 0.20    Years: 5.00    Pack years: 1.00    Types: Cigarettes    Last attempt to quit: 07/25/2015     Years since quitting: 2.0  . Smokeless tobacco: Never Used  Substance and Sexual Activity  . Alcohol use: No    Alcohol/week: 0.0 standard drinks  . Drug use: No  . Sexual activity: Yes    Birth control/protection: Pill  Lifestyle  . Physical activity:    Days per week: Not on file    Minutes per session: Not on file  . Stress: Not on file  Relationships  . Social connections:    Talks on phone: Not on file    Gets together: Not on file    Attends religious service: Not on file    Active member of club or organization: Not on file  Attends meetings of clubs or organizations: Not on file    Relationship status: Not on file  Other Topics Concern  . Not on file  Social History Narrative  . Not on file   Additional Social History:    Allergies:   Allergies  Allergen Reactions  . Linzess [Linaclotide] Nausea Only    Labs:  Results for orders placed or performed during the hospital encounter of 08/19/17 (from the past 48 hour(s))  Comprehensive metabolic panel     Status: Abnormal   Collection Time: 08/19/17  4:09 PM  Result Value Ref Range   Sodium 140 135 - 145 mmol/L   Potassium 2.9 (L) 3.5 - 5.1 mmol/L   Chloride 99 98 - 111 mmol/L   CO2 30 22 - 32 mmol/L   Glucose, Bld 105 (H) 70 - 99 mg/dL   BUN 10 6 - 20 mg/dL   Creatinine, Ser 0.87 0.44 - 1.00 mg/dL   Calcium 9.7 8.9 - 10.3 mg/dL   Total Protein 7.2 6.5 - 8.1 g/dL   Albumin 4.0 3.5 - 5.0 g/dL   AST 21 15 - 41 U/L   ALT 17 0 - 44 U/L   Alkaline Phosphatase 51 38 - 126 U/L   Total Bilirubin 0.8 0.3 - 1.2 mg/dL   GFR calc non Af Amer >60 >60 mL/min   GFR calc Af Amer >60 >60 mL/min    Comment: (NOTE) The eGFR has been calculated using the CKD EPI equation. This calculation has not been validated in all clinical situations. eGFR's persistently <60 mL/min signify possible Chronic Kidney Disease.    Anion gap 11 5 - 15    Comment: Performed at [facility], [address] [place], [place]  [address]  Ethanol     Status: None   Collection Time: 08/19/17  4:09 PM  Result Value Ref Range   Alcohol, Ethyl (B) <10 <10 mg/dL    Comment: (NOTE) Lowest detectable limit for serum alcohol is 10 mg/dL. For medical purposes only. Performed at [facility], [address] [place], [place] [address]   Salicylate level     Status: None   Collection Time: 08/19/17  4:09 PM  Result Value Ref Range   Salicylate Lvl <6.5 2.8 - 30.0 mg/dL    Comment: Performed at [facility], [address] [place], [place] [address]  Acetaminophen level     Status: Abnormal   Collection Time: 08/19/17  4:09 PM  Result Value Ref Range   Acetaminophen (Tylenol), Serum <10 (L) 10 - 30 ug/mL    Comment: (NOTE) Therapeutic concentrations vary significantly. A range of 10-30 ug/mL  may be an effective concentration for many patients. However, some  are best treated at concentrations outside of this range. Acetaminophen concentrations >150 ug/mL at 4 hours after ingestion  and >50 ug/mL at 12 hours after ingestion are often associated with  toxic reactions. Performed at [facility], [address] [place], [place] [address]   cbc     Status: Abnormal   Collection Time: 08/19/17  4:09 PM  Result Value Ref Range   WBC 27.3 (H) 3.6 - 11.0 K/uL   RBC 3.69 (L) 3.80 - 5.20 MIL/uL   Hemoglobin 11.5 (L) 12.0 - 16.0 g/dL   HCT 34.0 (L) 35.0 - 47.0 %   MCV 92.2 80.0 - 100.0 fL   MCH 31.1 26.0 - 34.0 pg   MCHC 33.7 32.0 - 36.0 g/dL   RDW 19.3 (H) 11.5 -  14.5 %   Platelets 587 (H) 150 - 440 K/uL    Comment: Performed at [facility], [address] [place], [place] [address]  Urine Drug Screen, Qualitative     Status: Abnormal   Collection Time: 08/19/17  4:09 PM  Result Value Ref Range   Tricyclic, Ur Screen NONE DETECTED NONE DETECTED   Amphetamines, Ur Screen NONE DETECTED NONE DETECTED   MDMA (Ecstasy)Ur Screen NONE DETECTED NONE DETECTED   Cocaine Metabolite,Ur  [place] NONE DETECTED NONE DETECTED   Opiate, Ur Screen NONE DETECTED NONE DETECTED   Phencyclidine (PCP) Ur S NONE DETECTED NONE DETECTED   Cannabinoid 50 Ng, Ur [place] NONE DETECTED NONE DETECTED   Barbiturates, Ur Screen NONE DETECTED NONE DETECTED   Benzodiazepine, Ur Scrn TEST NOT PERFORMED, REAGENT NOT AVAILABLE (A) NONE DETECTED   Methadone Scn, Ur NONE DETECTED NONE DETECTED    Comment: (NOTE) Tricyclics + metabolites, urine    Cutoff 1000 ng/mL Amphetamines + metabolites, urine  Cutoff 1000 ng/mL MDMA (Ecstasy), urine              Cutoff 500 ng/mL Cocaine Metabolite, urine          Cutoff 300 ng/mL Opiate + metabolites, urine        Cutoff 300 ng/mL Phencyclidine (PCP), urine         Cutoff 25 ng/mL Cannabinoid, urine                 Cutoff 50 ng/mL Barbiturates + metabolites, urine  Cutoff 200 ng/mL Benzodiazepine, urine              Cutoff 200 ng/mL Methadone, urine                   Cutoff 300 ng/mL The urine drug screen provides only a preliminary, unconfirmed analytical test result and should not be used for non-medical purposes. Clinical consideration and professional judgment should be applied to any positive drug screen result due to possible interfering substances. A more specific alternate chemical method must be used in order to obtain a confirmed analytical result. Gas chromatography / mass spectrometry (GC/MS) is the preferred confirmat ory method. Performed at [facility], [address] [place], [place] [address]   Urinalysis, Complete w Microscopic     Status: Abnormal   Collection Time: 08/19/17  6:04 PM  Result Value Ref Range   Color, Urine AMBER (A) YELLOW    Comment: BIOCHEMICALS MAY BE AFFECTED BY COLOR   APPearance CLEAR (A) CLEAR   Specific Gravity, Urine 1.004 (L) 1.005 - 1.030   pH 7.0 5.0 - 8.0   Glucose, UA NEGATIVE NEGATIVE mg/dL   Hgb urine dipstick NEGATIVE NEGATIVE   Bilirubin Urine NEGATIVE NEGATIVE   Ketones, ur NEGATIVE NEGATIVE  mg/dL   Protein, ur NEGATIVE NEGATIVE mg/dL   Nitrite POSITIVE (A) NEGATIVE   Leukocytes, UA NEGATIVE NEGATIVE   WBC, UA NONE SEEN 0 - 5 WBC/hpf   Bacteria, UA NONE SEEN NONE SEEN   Squamous Epithelial / LPF 0-5 0 - 5    Comment: Performed at [facility], [address] [place], [place] [address]  Pregnancy, urine POC     Status: None   Collection Time: 08/19/17  6:08 PM  Result Value Ref Range   Preg Test, Ur NEGATIVE NEGATIVE    Comment:        THE SENSITIVITY OF THIS METHODOLOGY IS >24 mIU/mL     Current Facility-Administered Medications  Medication Dose  Route Frequency Provider Last Rate Last Dose  . albuterol (PROVENTIL HFA;VENTOLIN HFA) 108 (90 Base) MCG/ACT inhaler 2 puff  2 puff Inhalation Q6H PRN Labrian Torregrossa T, MD      . budesonide (PULMICORT) 180 MCG/ACT inhaler 2 puff  2 puff Inhalation BID Paislyn Domenico T, MD      . buPROPion (WELLBUTRIN XL) 24 hr tablet 150 mg  150 mg Oral Daily Lavan Imes T, MD      . cephALEXin (KEFLEX) capsule 500 mg  500 mg Oral Q12H Delman Kitten, MD   500 mg at 08/19/17 2136  . gabapentin (NEURONTIN) capsule 300 mg  300 mg Oral TID Jimy Gates T, MD      . hydrochlorothiazide (HYDRODIURIL) tablet 25 mg  25 mg Oral Daily Dayana Dalporto, Madie Reno, MD      . Derrill Memo ON 08/20/2017] levothyroxine (SYNTHROID, LEVOTHROID) tablet 50 mcg  50 mcg Oral QAC breakfast Malisha Mabey T, MD      . loratadine (CLARITIN) tablet 10 mg  10 mg Oral Daily Eustacia Urbanek T, MD      . nicotine (NICODERM CQ - dosed in mg/24 hours) patch 21 mg  21 mg Transdermal Once Delman Kitten, MD      . nicotine (NICODERM CQ - dosed in mg/24 hours) patch 21 mg  21 mg Transdermal Once Delman Kitten, MD   21 mg at 08/19/17 2136  . pantoprazole (PROTONIX) EC tablet 40 mg  40 mg Oral BID Santanna Olenik T, MD      . polyethylene glycol (MIRALAX / GLYCOLAX) packet 17 g  17 g Oral Daily Zahira Brummond T, MD      . risperiDONE (RISPERDAL) tablet 2 mg  2 mg Oral QHS Endi Lagman T, MD      .  sucralfate (CARAFATE) tablet 1 g  1 g Oral TID WC & HS Nikia Mangino, Madie Reno, MD      . Derrill Memo ON 08/20/2017] tiotropium (SPIRIVA) inhalation capsule 18 mcg  18 mcg Inhalation Daily Staley Budzinski T, MD      . traMADol (ULTRAM) tablet 50 mg  50 mg Oral Q6H PRN Kore Madlock, Madie Reno, MD      . Derrill Memo ON 08/20/2017] venlafaxine (EFFEXOR) tablet 75 mg  75 mg Oral TID WC Annaka Cleaver, Madie Reno, MD       Current Outpatient Medications  Medication Sig Dispense Refill  . albuterol (PROVENTIL HFA;VENTOLIN HFA) 108 (90 Base) MCG/ACT inhaler Inhale into the lungs.    . budesonide-formoterol (SYMBICORT) 160-4.5 MCG/ACT inhaler Inhale 2 puffs into the lungs 2 (two) times daily.    . budesonide-formoterol (SYMBICORT) 160-4.5 MCG/ACT inhaler Inhale 2 puffs into the lungs 2 (two) times daily.    Marland Kitchen buPROPion (WELLBUTRIN XL) 150 MG 24 hr tablet Take 1 tablet (150 mg total) by mouth daily. 30 tablet 0  . cetirizine (ZYRTEC) 10 MG tablet Take 10 mg by mouth daily.    . clindamycin (CLEOCIN T) 1 % lotion Apply topically 2 (two) times daily as needed.    . diclofenac sodium (VOLTAREN) 1 % GEL Apply topically 4 (four) times daily.    . famotidine (PEPCID) 20 MG tablet Take by mouth.    . fluticasone (FLONASE) 50 MCG/ACT nasal spray SHAKE LIQUID AND USE 2 SPRAYS IN EACH NOSTRIL EVERY DAY AS NEEDED FOR RHINITIS OR ALLERGIES    . gabapentin (NEURONTIN) 600 MG tablet Take 0.5 tablets (300 mg total) by mouth 3 (three) times daily. 45 tablet 0  . hydrochlorothiazide (HYDRODIURIL) 25 MG tablet  Take 25 mg by mouth daily.    Marland Kitchen ibuprofen (ADVIL,MOTRIN) 800 MG tablet Take 800 mg by mouth every 8 (eight) hours as needed.    Marland Kitchen levonorgestrel-ethinyl estradiol (NORDETTE) 0.15-30 MG-MCG tablet Take 1 tablet by mouth daily.    Marland Kitchen levothyroxine (SYNTHROID, LEVOTHROID) 50 MCG tablet TAKE 1 TABLET(50 MCG) BY MOUTH EVERY DAY 30 TO 60 MINUTES BEFORE BREAKFAST ON AN EMPTY STOMACH AND WITH A GLASS OF WATER    . naloxegol oxalate (MOVANTIK) 12.5 MG TABS tablet  Take 25 mg by mouth daily.    . norethindrone (MICRONOR,CAMILA,ERRIN) 0.35 MG tablet Take by mouth.    Marland Kitchen omeprazole (PRILOSEC) 40 MG capsule Take 40 mg by mouth daily.    Marland Kitchen oxyCODONE-acetaminophen (PERCOCET/ROXICET) 5-325 MG tablet Take 1 tablet by mouth as needed for pain.    . polyethylene glycol powder (GLYCOLAX/MIRALAX) powder Take by mouth.    . risperiDONE (RISPERDAL) 2 MG tablet Take 1 tablet (2 mg total) by mouth at bedtime. 30 tablet 0  . tiotropium (SPIRIVA) 18 MCG inhalation capsule Place 18 mcg into inhaler and inhale daily.     Marland Kitchen tiZANidine (ZANAFLEX) 2 MG tablet LIMIT TO 2 TO 3 TS PO PER DAY IF TOLERATED  0  . tolterodine (DETROL LA) 2 MG 24 hr capsule TAKE ONE CAPSULE BY MOUTH ONCE DAILY    . traMADol (ULTRAM) 50 MG tablet Take 1 tablet (50 mg total) by mouth 3 (three) times daily. 90 tablet 0  . umeclidinium bromide (INCRUSE ELLIPTA) 62.5 MCG/INH AEPB Inhale 1 puff into the lungs daily.    . valACYclovir (VALTREX) 1000 MG tablet Take 1,000 mg by mouth as needed.    . venlafaxine (EFFEXOR) 75 MG tablet Take 1 tablet (75 mg total) by mouth 3 (three) times daily with meals. 90 tablet 0    Musculoskeletal: Strength & Muscle Tone: within normal limits Gait & Station: normal Patient leans: N/A  Psychiatric Specialty Exam: Physical Exam  Nursing note and vitals reviewed. Constitutional: She appears well-developed and well-nourished.  HENT:  Head: Normocephalic and atraumatic.  Eyes: Pupils are equal, round, and reactive to light. Conjunctivae are normal.  Neck: Normal range of motion.  Cardiovascular: Regular rhythm and normal heart sounds.  Respiratory: Effort normal. No respiratory distress.  GI: Soft.  Musculoskeletal: Normal range of motion.  Neurological: She is alert.  Skin: Skin is warm and dry.  Psychiatric: Her affect is blunt. Her speech is delayed. She is slowed. Cognition and memory are normal. She expresses impulsivity. She expresses suicidal ideation. She  expresses suicidal plans.    Review of Systems  Constitutional: Negative.   HENT: Negative.   Eyes: Negative.   Respiratory: Positive for shortness of breath.   Cardiovascular: Negative.   Gastrointestinal: Positive for abdominal pain and constipation.  Musculoskeletal: Negative.   Skin: Negative.   Neurological: Negative.   Psychiatric/Behavioral: Positive for depression and suicidal ideas. Negative for hallucinations, memory loss and substance abuse. The patient is nervous/anxious. The patient does not have insomnia.     Blood pressure 124/71, pulse 100, temperature 99.5 F (37.5 C), temperature source Oral, resp. rate 16, height 4' 11"  (1.499 m), weight 87.1 kg, SpO2 90 %.Body mass index is 38.78 kg/m.  General Appearance: Disheveled  Eye Contact:  Fair  Speech:  Clear and Coherent  Volume:  Decreased  Mood:  Depressed and Dysphoric  Affect:  Congruent  Thought Process:  Goal Directed  Orientation:  Full (Time, Place, and Person)  Thought Content:  Logical  Suicidal Thoughts:  Yes.  with intent/plan  Homicidal Thoughts:  No  Memory:  Immediate;   Fair Recent;   Fair Remote;   Fair  Judgement:  Fair  Insight:  Fair  Psychomotor Activity:  Normal  Concentration:  Concentration: Fair  Recall:  [employer] of Knowledge:  Fair  Language:  Fair  Akathisia:  No  Handed:  Right  AIMS (if indicated):     Assets:  Desire for Improvement Housing Resilience  ADL's:  Intact  Cognition:  WNL  Sleep:        Treatment Plan Summary: Daily contact with patient to assess and evaluate symptoms and progress in treatment, Medication management and Plan Patient with chronic mood problems having a decompensation with worse depression.  Active suicidal thoughts.  Patient needs admission to the psychiatric unit for safety.  She agrees to the plan.  Case reviewed with ER doctor.  Try to restart medicines as best we can for what she is currently taking especially psychiatric medicine.   Nicotine patch provided.  Full set of labs will be done.  15-minute checks.  Case reviewed with TTS and ER physician.  Disposition: Recommend psychiatric Inpatient admission when medically cleared. Supportive therapy provided about ongoing stressors.  Alethia Berthold, MD 08/19/2017 9:49 PM

## 2017-08-19 NOTE — ED Provider Notes (Signed)
[facility] Emergency Department Provider Note   ____________________________________________   First MD Initiated Contact with Patient 08/19/17 2008     (approximate)  I have reviewed the triage vital signs and the nursing notes.   HISTORY  Chief Complaint Suicidal    HPI Kirsten Richardson is a 42 y.o. female reports that she is getting sick and tired of being sick.  She is having thoughts of wanting to herself and feeling suicidal at times.  She is feeling somewhat better now.  She also reports that she was recently treated with steroids and antibiotics for a sinus infection, came off steroids about 3 days ago and is still has about 3 days of Keflex she is supposed to take for a urinary tract infection that seems to be getting better.  Patient also endorses that she was feeling suicidal earlier and thinks it may be because she ran out of her medications.  She reports she had previous overdose attempts, but none today.     Past Medical History:  Diagnosis Date  . Anemia   . Arthritis   . Back pain   . Bipolar disorder (HCC)   . Borderline personality disorder (HCC)   . Carpal tunnel syndrome   . Degenerative disc disease, lumbar   . Depression   . Eczema   . Emphysema of lung (HCC)   . Emphysema of lung (HCC)   . GERD (gastroesophageal reflux disease)   . History of hemorrhoids   . Hypertension   . Hypothyroidism   . Neck pain   . Plantar fasciitis   . Plantar fasciitis   . Scoliosis   . SUI (stress urinary incontinence, female)   . Thyroid disease   . Vitamin B 12 deficiency     Patient Active Problem List   Diagnosis Date Noted  . Peptic ulcer 08/19/2017  . COPD (chronic obstructive pulmonary disease) (HCC) 08/26/2016  . Constipation 08/26/2016  . Bipolar 2 disorder, major depressive episode (HCC) 08/25/2016  . Essential hypertension 08/31/2015  . DDD (degenerative disc disease), lumbar 08/02/2015  . DDD (degenerative disc  disease), cervical 08/02/2015  . Hypothyroidism due to acquired atrophy of thyroid 04/04/2015  . Hip pain, chronic 09/27/2014  . Borderline personality disorder (HCC) 09/27/2014  . SUI (stress urinary incontinence, female) 01/02/2014    Past Surgical History:  Procedure Laterality Date  . CARPAL TUNNEL RELEASE Right 2018   then left done a few weeks later  . COLONOSCOPY WITH PROPOFOL N/A 04/02/2017   Procedure: COLONOSCOPY WITH PROPOFOL;  Surgeon: Christena Deem, MD;  Location: [facility] ENDOSCOPY;  Service: Endoscopy;  Laterality: N/A;  . ESOPHAGOGASTRODUODENOSCOPY (EGD) WITH PROPOFOL N/A 07/21/2017   Procedure: ESOPHAGOGASTRODUODENOSCOPY (EGD) WITH PROPOFOL;  Surgeon: Christena Deem, MD;  Location: [facility] ENDOSCOPY;  Service: Endoscopy;  Laterality: N/A;  . FOOT SURGERY Right    plantar fasciatis  . HIP FRACTURE SURGERY Bilateral   . TUBAL LIGATION    . WISDOM TOOTH EXTRACTION  09/2005    Prior to Admission medications   Medication Sig Start Date End Date Taking? Authorizing Provider  albuterol (PROVENTIL HFA;VENTOLIN HFA) 108 (90 Base) MCG/ACT inhaler Inhale into the lungs. 02/25/16 08/19/17 Yes [provider]  budesonide-formoterol (SYMBICORT) 160-4.5 MCG/ACT inhaler Inhale 2 puffs into the lungs 2 (two) times daily.   Yes [provider]  buPROPion (WELLBUTRIN XL) 150 MG 24 hr tablet Take 1 tablet (150 mg total) by mouth daily. 09/02/16  Yes Jimmy Footman, MD  cetirizine (ZYRTEC) 10  MG tablet Take 10 mg by mouth daily.   Yes [provider]  diclofenac sodium (VOLTAREN) 1 % GEL Apply topically 4 (four) times daily.   Yes [provider]  famotidine (PEPCID) 20 MG tablet Take by mouth. 12/06/15 08/19/17 Yes [provider]  fluticasone (FLONASE) 50 MCG/ACT nasal spray SHAKE LIQUID AND USE 2 SPRAYS IN EACH NOSTRIL EVERY DAY AS NEEDED FOR RHINITIS OR ALLERGIES 04/15/16  Yes [provider]  gabapentin (NEURONTIN) 600 MG  tablet Take 0.5 tablets (300 mg total) by mouth 3 (three) times daily. 09/01/16  Yes Jimmy Footman, MD  hydrochlorothiazide (HYDRODIURIL) 25 MG tablet Take 25 mg by mouth daily.   Yes [provider]  levonorgestrel-ethinyl estradiol (NORDETTE) 0.15-30 MG-MCG tablet Take 1 tablet by mouth daily.   Yes [provider]  levothyroxine (SYNTHROID, LEVOTHROID) 50 MCG tablet TAKE 1 TABLET(50 MCG) BY MOUTH EVERY DAY 30 TO 60 MINUTES BEFORE BREAKFAST ON AN EMPTY STOMACH AND WITH A GLASS OF WATER 03/31/16  Yes [provider]  lisinopril (PRINIVIL,ZESTRIL) 10 MG tablet Take 10 mg by mouth daily.   Yes [provider]  methocarbamol (ROBAXIN) 500 MG tablet Take 1 tablet by mouth 2 (two) times daily. 08/04/17  Yes [provider]  mirabegron ER (MYRBETRIQ) 25 MG TB24 tablet TAKE 1 TABLET BY MOUTH DAILY 07/08/17  Yes [provider]  naloxegol oxalate (MOVANTIK) 12.5 MG TABS tablet Take 25 mg by mouth daily.   Yes [provider]  omeprazole (PRILOSEC) 40 MG capsule Take 40 mg by mouth daily.   Yes [provider]  oxyCODONE (OXY IR/ROXICODONE) 5 MG immediate release tablet Take 5 mg by mouth every 4 (four) hours as needed.    Yes [provider]  pantoprazole (PROTONIX) 40 MG tablet TAKE 1 TABLET DAILY 2 HOURS AFTER SYNTHROID AND 1 TABLET 30 MINUTES BEFOREDINNER 08/17/17  Yes [provider]  polyethylene glycol powder (GLYCOLAX/MIRALAX) powder Take by mouth. 04/24/16  Yes [provider]  risperiDONE (RISPERDAL) 2 MG tablet Take 1 tablet (2 mg total) by mouth at bedtime. 09/01/16  Yes Jimmy Footman, MD  sucralfate (CARAFATE) 1 g tablet Take 1 tablet by mouth 4 (four) times daily. 07/28/17 10/26/17 Yes [provider]  tolterodine (DETROL LA) 2 MG 24 hr capsule TAKE ONE CAPSULE BY MOUTH ONCE DAILY 05/29/16  Yes [provider]  traMADol (ULTRAM) 50 MG tablet Take 1 tablet (50 mg  total) by mouth 3 (three) times daily. Patient taking differently: Take 50 mg by mouth 4 (four) times daily.  12/18/14  Yes Tod Persia, MD  umeclidinium bromide (INCRUSE ELLIPTA) 62.5 MCG/INH AEPB Inhale 1 puff into the lungs daily.   Yes [provider]  venlafaxine (EFFEXOR) 75 MG tablet Take 1 tablet (75 mg total) by mouth 3 (three) times daily with meals. 09/01/16  Yes Jimmy Footman, MD  budesonide-formoterol [facility]) 160-4.5 MCG/ACT inhaler Inhale 2 puffs into the lungs 2 (two) times daily.    [provider]  clindamycin (CLEOCIN T) 1 % lotion Apply topically 2 (two) times daily as needed.    [provider]  ibuprofen (ADVIL,MOTRIN) 800 MG tablet Take 800 mg by mouth every 8 (eight) hours as needed.    [provider]  norethindrone (MICRONOR,CAMILA,ERRIN) 0.35 MG tablet Take by mouth. 07/18/16   [provider]  oxyCODONE-acetaminophen (PERCOCET/ROXICET) 5-325 MG tablet Take 1 tablet by mouth as needed for pain.    [provider]  tiotropium (SPIRIVA) 18 MCG inhalation  capsule Place 18 mcg into inhaler and inhale daily.     [provider]  tiZANidine (ZANAFLEX) 2 MG tablet LIMIT TO 2 TO 3 TS PO PER DAY IF TOLERATED 08/01/16   [provider]  valACYclovir (VALTREX) 1000 MG tablet Take 1,000 mg by mouth as needed.    [provider]    Allergies Linzess [linaclotide]  Family History  Problem Relation Age of Onset  . Arthritis Mother   . COPD Mother   . Cancer Mother   . Depression Mother   . Early death Mother   . Vision loss Mother   . Mental illness Mother   . Alcohol abuse Father   . Arthritis Father   . Cancer Father   . Diabetes Father   . Drug abuse Father   . Early death Father   . Vision loss Father   . Heart disease Father   . Hypertension Father   . Mental illness Father   . Stroke Father   . Lung cancer Sister   . Pancreatitis Brother   . Hypertension Brother     . Diabetes Brother   . Diverticulitis Brother   . Cirrhosis Brother   . Hypertension Paternal Grandmother   . Heart disease Paternal Grandmother     Social History Social History   Tobacco Use  . Smoking status: Former Smoker    Packs/day: 0.20    Years: 5.00    Pack years: 1.00    Types: Cigarettes    Last attempt to quit: 07/25/2015    Years since quitting: 2.0  . Smokeless tobacco: Never Used  Substance Use Topics  . Alcohol use: No    Alcohol/week: 0.0 standard drinks  . Drug use: No    Review of Systems Constitutional: No fever/chills but did have some earlier in the week but improving after treatment with antibiotics for urinary tract infection and prednisone for a sinus infection Eyes: No visual changes. ENT: No sore throat.  Had sinus congestion but better now. Cardiovascular: Denies chest pain. Respiratory: Denies shortness of breath. Gastrointestinal: No abdominal pain.  No nausea, no vomiting.  No diarrhea.  No constipation. Genitourinary: Negative for dysuria.  Had pain and burning with urination, but after being on antibiotics for about the last 5 to 6 days she is feeling quite a bit better.  Symptoms improving. Musculoskeletal: Negative for back pain. Skin: Negative for rash. Neurological: Negative for headaches, focal weakness or numbness.    ____________________________________________   PHYSICAL EXAM:  VITAL SIGNS: ED Triage Vitals  Enc Vitals Group     BP 08/19/17 1548 120/76     Pulse Rate 08/19/17 1548 (!) 118     Resp 08/19/17 1548 20     Temp 08/19/17 1548 99.5 F (37.5 C)     Temp Source 08/19/17 1548 Oral     SpO2 08/19/17 1548 93 %     Weight 08/19/17 1549 192 lb (87.1 kg)     Height 08/19/17 1549 4\' 11"  (1.499 m)     Head Circumference --      Peak Flow --      Pain Score 08/19/17 1557 9     Pain Loc --      Pain Edu? --      Excl. in GC? --     Constitutional: Alert and oriented. Well appearing and in no acute distress.   Resting pleasantly. Eyes: Conjunctivae are normal. Head: Atraumatic. Nose: No congestion/rhinnorhea. Mouth/Throat: Mucous membranes are moist. Neck: No  stridor.   Cardiovascular: Normal rate, regular rhythm. Grossly normal heart sounds.  Good peripheral circulation. Respiratory: Normal respiratory effort.  No retractions. Lungs CTAB. Gastrointestinal: Soft and nontender. No distention. Musculoskeletal: No lower extremity tenderness nor edema. Neurologic:  Normal speech and language. No gross focal neurologic deficits are appreciated.  Skin:  Skin is warm, dry and intact. No rash noted. Psychiatric: Mood and affect are flat, she endorses feeling sad thoughts of suicide but is voluntary and willing to be seen by psychiatry.  She reports that she would not want to take any action to harm herself here, but continues to feel at times that she is suicidal.  ____________________________________________   LABS (all labs ordered are listed, but only abnormal results are displayed)  Labs Reviewed  COMPREHENSIVE METABOLIC PANEL - Abnormal; Notable for the following components:      Result Value   Potassium 2.9 (*)    Glucose, Bld 105 (*)    All other components within normal limits  ACETAMINOPHEN LEVEL - Abnormal; Notable for the following components:   Acetaminophen (Tylenol), Serum <10 (*)    All other components within normal limits  CBC - Abnormal; Notable for the following components:   WBC 27.3 (*)    RBC 3.69 (*)    Hemoglobin 11.5 (*)    HCT 34.0 (*)    RDW 19.3 (*)    Platelets 587 (*)    All other components within normal limits  URINE DRUG SCREEN, QUALITATIVE ([facility] ONLY) - Abnormal; Notable for the following components:   Benzodiazepine, Ur Scrn TEST NOT PERFORMED, REAGENT NOT AVAILABLE (*)    All other components within normal limits  URINALYSIS, COMPLETE (UACMP) WITH MICROSCOPIC - Abnormal; Notable for the following components:   Color, Urine AMBER (*)    APPearance CLEAR  (*)    Specific Gravity, Urine 1.004 (*)    Nitrite POSITIVE (*)    All other components within normal limits  ETHANOL  SALICYLATE LEVEL  POC URINE PREG, ED  POCT PREGNANCY, URINE   ____________________________________________  EKG   ____________________________________________  RADIOLOGY   ____________________________________________   PROCEDURES  Procedure(s) performed: None  Procedures  Critical Care performed: No  ____________________________________________   INITIAL IMPRESSION / ASSESSMENT AND PLAN / ED COURSE  Pertinent labs & imaging results that were available during my care of the patient were reviewed by me and considered in my medical decision making (see chart for details).  Patient presents for evaluation of suicidal thoughts.  Presents voluntarily and is happy to continue on voluntary basis here.  She reports she does not want to harm herself not she is here in the emergency department, but continues to have suicidal thoughts.  I placed a consult to psychiatry.  She is here voluntarily and willing to undergo psychiatric evaluation I will leave her voluntary at this time.  Additionally, her lab work shows a notable leukocytosis but she reports her symptoms of infection are improving, her urinalysis does indicate possibly some slight ongoing infection which we will continue to treat with cephalexin.  White count elevation is probably well explained by her recent prednisone use as well.  Does not appear toxic, and on repeat vital signs when resting her vital signs of normalized.  Vitals:   08/19/17 1548 08/19/17 2011  BP: 120/76 124/71  Pulse: (!) 118 100  Resp: 20 16  Temp: 99.5 F (37.5 C)   SpO2: 93% 90%        Discussed case with Dr. Toni Amend, he is  admitting to psychiatry service.  Discussed her UTI, leukocytosis and her continued plan for treatment and Dr. Toni Amend said agreement.  Ongoing care assigned to Dr. Derrill Kay, patient currently pending  admission to psychiatry. ____________________________________________   FINAL CLINICAL IMPRESSION(S) / ED DIAGNOSES  Final diagnoses:  Suicidal ideation  Acute cystitis without hematuria      NEW MEDICATIONS STARTED DURING THIS VISIT:  Discharge Medication List as of 08/19/2017 10:40 PM       Note:  This document was prepared using Dragon voice recognition software and may include unintentional dictation errors.     Sharyn Creamer, MD 08/20/17 [phone]

## 2017-08-19 NOTE — ED Notes (Signed)
Pt ambulated to bathroom for UA and back to 19H. Pt is resting at this time and given blanket.

## 2017-08-19 NOTE — ED Notes (Signed)
Pt dressed out in burgundy scrubs.  Labs drawn and sent to lab.  Pys belongings, which include black purse, cell phone, black wallet with 35.00 in cash (1- 20,00, 1- 10.00, 1- 5,00 bills), misc items in wallet but no credit/debit cards.  2 books, keys,scissors.  In bag 2 is shoes, socks, shorts, shirt,  Underwear, bra, hairtie, 1 silver colored hoop earring, 1 silver colored navel ring, 1 gold colored ring, 1 silver colored rimg with clear stone.  Pt has gabapentin and flocalin perscription bottles,  And zofran and phenazopyridine in bubble packs.  Sitting with pt in family room awaiting room in back.

## 2017-08-19 NOTE — ED Notes (Signed)
EDT sitting with patient in family room until room available for patient to be seen by MD.

## 2017-08-19 NOTE — ED Notes (Signed)
FIRST NURSE NOTE:  Pt arrived via wheelchair from [facility] from Dr. Stasia Cavalier office with reports of SI. Pt has hx of bipolar. Pt is cooperative and calm in lobby, states she has been out of her meds for a few days and had a break down at Dr. Stasia Cavalier office.  Pt his here voluntarily.

## 2017-08-19 NOTE — ED Notes (Signed)
Pt observed to be sleeping / with = chest rise and fall , NAD noted .

## 2017-08-19 NOTE — ED Notes (Signed)
Pt awake A/O x4 , pt given gram crackers and water per request at this time .

## 2017-08-19 NOTE — ED Notes (Signed)
Pt reports she quit smoking 1 year ago.  Since then, she has gained 100 pounds and has less activity and continues to get bigger.  Pt is depressed about it and wants help.  Pt denie etoh use or drug use.  Pt states SI   Denies HI.  Pt lives with ex husband.  Pt calm and cooperative.

## 2017-08-19 NOTE — ED Notes (Signed)
Consult provider to bed side at this time .

## 2017-08-20 DIAGNOSIS — F3181 Bipolar II disorder: Principal | ICD-10-CM

## 2017-08-20 LAB — TSH: TSH: 0.581 u[IU]/mL (ref 0.350–4.500)

## 2017-08-20 LAB — LIPID PANEL
Cholesterol: 157 mg/dL (ref 0–200)
HDL: 40 mg/dL — ABNORMAL LOW (ref >40)
LDL Cholesterol: 49 mg/dL (ref 0–99)
Total CHOL/HDL Ratio: 3.9 RATIO
Triglycerides: 341 mg/dL — ABNORMAL HIGH (ref <150)
VLDL: 68 mg/dL — ABNORMAL HIGH (ref 0–40)

## 2017-08-20 LAB — HEMOGLOBIN A1C
Hgb A1c MFr Bld: 4.8 % (ref 4.8–5.6)
Mean Plasma Glucose: 91.06 mg/dL

## 2017-08-20 MED ORDER — MENTHOL 3 MG MT LOZG
1.0000 | LOZENGE | OROMUCOSAL | Status: DC | PRN
  Filled 2017-08-20: qty 9

## 2017-08-20 MED ORDER — HYDROXYZINE HCL 50 MG PO TABS
50.0000 mg | ORAL_TABLET | Freq: Three times a day (TID) | ORAL | Status: DC | PRN
  Administered 2017-08-23 – 2017-08-26 (×4): 50 mg via ORAL
  Filled 2017-08-20 (×4): qty 1

## 2017-08-20 MED ORDER — PHENAZOPYRIDINE HCL 100 MG PO TABS
100.0000 mg | ORAL_TABLET | Freq: Three times a day (TID) | ORAL | Status: AC
  Administered 2017-08-20 – 2017-08-22 (×6): 100 mg via ORAL
  Filled 2017-08-20 (×6): qty 1

## 2017-08-20 MED ORDER — ONDANSETRON HCL 4 MG PO TABS
4.0000 mg | ORAL_TABLET | Freq: Three times a day (TID) | ORAL | Status: DC
  Administered 2017-08-20 – 2017-08-23 (×8): 4 mg via ORAL
  Filled 2017-08-20 (×12): qty 1

## 2017-08-20 MED ORDER — LEVOTHYROXINE SODIUM 50 MCG PO TABS
50.0000 ug | ORAL_TABLET | Freq: Every day | ORAL | Status: DC
  Administered 2017-08-20 – 2017-08-26 (×7): 50 ug via ORAL
  Filled 2017-08-20 (×7): qty 1

## 2017-08-20 MED ORDER — FAMOTIDINE 20 MG PO TABS
20.0000 mg | ORAL_TABLET | Freq: Every day | ORAL | Status: DC | PRN
  Administered 2017-08-20 – 2017-08-21 (×2): 20 mg via ORAL
  Filled 2017-08-20 (×2): qty 1

## 2017-08-20 MED ORDER — PANTOPRAZOLE SODIUM 20 MG PO TBEC
20.0000 mg | DELAYED_RELEASE_TABLET | Freq: Two times a day (BID) | ORAL | Status: DC | PRN
  Administered 2017-08-20 (×2): 20 mg via ORAL
  Filled 2017-08-20 (×3): qty 1

## 2017-08-20 MED ORDER — VENLAFAXINE HCL 37.5 MG PO TABS
37.5000 mg | ORAL_TABLET | Freq: Two times a day (BID) | ORAL | Status: DC
  Administered 2017-08-20 – 2017-08-25 (×13): 37.5 mg via ORAL
  Filled 2017-08-20 (×15): qty 1

## 2017-08-20 MED ORDER — POTASSIUM CHLORIDE CRYS ER 20 MEQ PO TBCR
40.0000 meq | EXTENDED_RELEASE_TABLET | Freq: Two times a day (BID) | ORAL | Status: AC
  Administered 2017-08-20 (×2): 40 meq via ORAL
  Filled 2017-08-20 (×2): qty 2

## 2017-08-20 MED ORDER — PANTOPRAZOLE SODIUM 40 MG PO TBEC: 40.0000 mg | DELAYED_RELEASE_TABLET | Freq: Two times a day (BID) | ORAL | Status: DC | PRN

## 2017-08-20 MED ORDER — DEXMETHYLPHENIDATE HCL 5 MG PO TABS: 5.0000 mg | ORAL_TABLET | Freq: Two times a day (BID) | ORAL | Status: DC

## 2017-08-20 MED ORDER — VENLAFAXINE HCL 37.5 MG PO TABS
75.0000 mg | ORAL_TABLET | ORAL | Status: DC
  Administered 2017-08-21 – 2017-08-26 (×7): 75 mg via ORAL
  Filled 2017-08-20 (×6): qty 2

## 2017-08-20 MED ORDER — BUPROPION HCL ER (XL) 150 MG PO TB24
300.0000 mg | ORAL_TABLET | Freq: Every day | ORAL | Status: DC
  Administered 2017-08-21 – 2017-08-26 (×6): 300 mg via ORAL
  Filled 2017-08-20 (×6): qty 2

## 2017-08-20 MED ORDER — DEXMETHYLPHENIDATE HCL 5 MG PO TABS
5.0000 mg | ORAL_TABLET | Freq: Two times a day (BID) | ORAL | Status: DC
  Administered 2017-08-20 – 2017-08-26 (×12): 5 mg via ORAL
  Filled 2017-08-20 (×12): qty 1
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
## 2017-08-20 NOTE — Progress Notes (Signed)
Recreation Therapy Notes  Date: 08/20/2017  Time: 9:30 am   Location: Craft Room   Behavioral response: N/A   Intervention Topic: Coping Skills  Discussion/Intervention: Patient did not attend group.   Clinical Observations/Feedback:  Patient did not attend group.   Laresha Bacorn LRT/CTRS        Diara Chaudhari 08/20/2017 10:43 AM

## 2017-08-20 NOTE — [facility] Suicide Risk Assessment (Signed)
[facility] INPATIENT:  Family/Significant Other Suicide Prevention Education  Suicide Prevention Education:  Patient Refusal for Family/Significant Other Suicide Prevention Education: The patient Kirsten Richardson has refused to provide written consent for family/significant other to be provided Family/Significant Other Suicide Prevention Education during admission and/or prior to discharge.  Physician notified.  Johny Shears 08/20/2017, 11:08 AM

## 2017-08-20 NOTE — BHH Group Notes (Signed)
LCSW Group Therapy Note 08/20/2017 9:00 AM  Type of Therapy and Topic:  Group Therapy:  Setting Goals  Participation Level:  Did Not Attend  Description of Group: In this process group, patients discussed using strengths to work toward goals and address challenges.  Patients identified two positive things about themselves and one goal they were working on.  Patients were given the opportunity to share openly and support each other's plan for self-empowerment.  The group discussed the value of gratitude and were encouraged to have a daily reflection of positive characteristics or circumstances.  Patients were encouraged to identify a plan to utilize their strengths to work on current challenges and goals.  Therapeutic Goals 1. Patient will verbalize personal strengths/positive qualities and relate how these can assist with achieving desired personal goals 2. Patients will verbalize affirmation of peers plans for personal change and goal setting 3. Patients will explore the value of gratitude and positive focus as related to successful achievement of goals 4. Patients will verbalize a plan for regular reinforcement of personal positive qualities and circumstances.  Summary of Patient Progress:  Kirsten Richardson was invited to today's group, but chose not to attend.     Therapeutic Modalities Cognitive Behavioral Therapy Motivational Interviewing    Alease Frame, [place] 08/20/2017 11:07 AM

## 2017-08-20 NOTE — Progress Notes (Signed)
Recreation Therapy Notes  Date: 08/20/2017  Time: 3:00pm  Location: Craft room  Behavioral response: N/A  Group Type: Craft  Participation level: N/A  Communication: Patient did not attend group.  Comments: N/A  Dezmen Alcock LRT/CTRS        Etai Copado 08/20/2017 3:28 PM

## 2017-08-20 NOTE — BHH Group Notes (Signed)
BHH Group Notes:  (Nursing/MHT/Case Management/Adjunct)  Date:  08/20/2017  Time:  9:42 PM  Type of Therapy:  Group Therapy  Participation Level:  Active  Participation Quality:  Appropriate  Affect:  Appropriate  Cognitive:  Appropriate  Insight:  Appropriate  Engagement in Group:  Engaged  Modes of Intervention:  Discussion  Summary of Progress/Problems: Kirsten Richardson participated in group. Kirsten Richardson stated her goal was to let people know she was on the unit. Kirsten Richardson stated she did not plan to be here, so she had to inform her family she was there. Kirsten Richardson stated she was only able to reach her ex-boyfriend. Kirsten Richardson stated he was the most important person she wanted to reach. MHT explained rules and expectations of the unit. MHT processed with patients about preparing for discharge. MHT processed with patients about medication compliance. MHT informed patients of outpatient treatment. MHT explained the benefits of outpatient treatment combined with medication compliance. MHT encouraged patients to follow up with treatment recommendations at discharge. Kirsten Richardson 08/20/2017, 9:42 PM

## 2017-08-20 NOTE — Tx Team (Signed)
Initial Treatment Plan 08/20/2017 2:02 AM [name] VFI:[id number]    PATIENT STRESSORS: Health problems Medication change or noncompliance   PATIENT STRENGTHS: Ability for insight Average or above average intelligence Capable of independent living Motivation for treatment/growth Supportive family/friends   PATIENT IDENTIFIED PROBLEMS: Mood Instability  Suicidal Thoughts  Ineffective Coping Skills                 DISCHARGE CRITERIA:  Improved stabilization in mood, thinking, and/or behavior Motivation to continue treatment in a less acute level of care Reduction of life-threatening or endangering symptoms to within safe limits Verbal commitment to aftercare and medication compliance  PRELIMINARY DISCHARGE PLAN: Outpatient therapy Return to previous living arrangement  PATIENT/FAMILY INVOLVEMENT: This treatment plan has been presented to and reviewed with the patient, Kirsten Richardson.  The patient have been given the opportunity to ask questions and make suggestions.  Cleotis Nipper, RN 08/20/2017, 2:02 AM
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
## 2017-08-20 NOTE — Plan of Care (Signed)
Patient slept for Estimated Hours of 4.45; Precautionary checks every 15 minutes for safety maintained, room free of safety hazards, patient sustains no injury or falls during this shift.  Problem: Activity: Goal: Sleeping patterns will improve Outcome: Progressing   Problem: Safety: Goal: Periods of time without injury will increase Outcome: Progressing   Problem: Medication: Goal: Compliance with prescribed medication regimen will improve Outcome: Progressing   Problem: Self-Concept: Goal: Will verbalize positive feelings about self Outcome: Progressing

## 2017-08-20 NOTE — Progress Notes (Signed)
Patient ID: Kirsten Richardson, female   DOB: 1975-09-05, 42 y.o.   MRN: [id number] Admitted from [facility] for Depression and Suicidal thoughts of cutting or by drug overdosing. "I have been dealing with my weight for a long time, I have medical problems, I can hardly breath, I feel like I am going to pass out. Dr. Marcelino Duster, PCP, diagnosed me of having Peptic Ulcer, when he went over my list that I gave him, he noticed SI and sent me here. I am a repentant sinner, God will forgive me when I die, I know that for a fact..." Patient has 2 sons from 2 previous marriages: Thayer Ohm, 24 & Imagene Gurney; she resides with her first husband. Last admitted to [facility] in August 26, 2016; past psychiatric h/o Mood d/o and several prior suicide attempts.  Per review of records: UDS negative for illicit drugs, K+=2.9 (L), WBC=27.3 (H). Unit Guidelines, Treatment Agreements and Expected Acceptable Behaviors discussed, questions encouraged, answers provided, c/o 9/10 generalized pain, Tramadol 50 mg given; Trazodone 100 mg given after Unit and room orientation completed. Skin assessment and Contrabands search completed by Jon Gills, RN (See Notes as documented).

## 2017-08-20 NOTE — BHH Suicide Risk Assessment (Signed)
[facility] Admission Suicide Risk Assessment   Nursing information obtained from:  Patient, Review of record Demographic factors:  Caucasian, Low socioeconomic status, Unemployed Current Mental Status:  Self-harm thoughts, Belief that plan would result in death Loss Factors:  Decline in physical health, Financial problems / change in socioeconomic status Historical Factors:  Prior suicide attempts Risk Reduction Factors:  Responsible for children under 52 years of age, Living with another person, especially a relative, Positive therapeutic relationship  Total Time spent with patient: 1 hour Principal Problem: Bipolar 2 disorder, major depressive episode (HCC) Diagnosis:   Patient Active Problem List   Diagnosis Date Noted  . Bipolar 2 disorder, major depressive episode (HCC) [F31.81] 08/25/2016    Priority: High  . Borderline personality disorder (HCC) [F60.3] 09/27/2014    Priority: High  . Peptic ulcer [K27.9] 08/19/2017  . COPD (chronic obstructive pulmonary disease) (HCC) [J44.9] 08/26/2016  . Constipation [K59.00] 08/26/2016  . Essential hypertension [I10] 08/31/2015  . DDD (degenerative disc disease), lumbar [M51.36] 08/02/2015  . DDD (degenerative disc disease), cervical [M50.30] 08/02/2015  . 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 Hip pain, chronic [M25.559, G89.29] 09/27/2014  . SUI (stress urinary incontinence, female) [N39.3] 01/02/2014   Subjective Data: See h&P  Continued Clinical Symptoms:  Alcohol Use Disorder Identification Test Final Score (AUDIT): 0 The "Alcohol Use Disorders Identification Test", Guidelines for Use in Primary Care, Second Edition.  World Science writer [facility]). Score between 0-7:  no or low risk or alcohol related problems. Score between 8-15:  moderate risk of alcohol related problems. Score between 16-19:  high risk of alcohol related problems. Score 20 or above:  warrants further diagnostic evaluation for alcohol  dependence and treatment.   CLINICAL FACTORS:   Personality Disorders:   Cluster B Chronic Pain More than one psychiatric diagnosis Previous Psychiatric Diagnoses and Treatments Medical Diagnoses and Treatments/Surgeries        COGNITIVE FEATURES THAT CONTRIBUTE TO RISK:  None    SUICIDE RISK:   Moderate:  Frequent suicidal ideation with limited intensity, and duration, some specificity in terms of plans, no associated intent, good self-control, limited dysphoria/symptomatology, some risk factors present, and identifiable protective factors, including available and accessible social support.  PLAN OF CARE: See h&P  I certify that inpatient services furnished can reasonably be expected to improve the patient's condition.   Haskell Riling, MD 08/20/2017, 3:59 PM

## 2017-08-20 NOTE — Progress Notes (Signed)
Recreation Therapy Notes  INPATIENT RECREATION THERAPY ASSESSMENT  Patient Details Name: Kirsten Richardson MRN: [id number] DOB: [birth year] Today's Date: 08/20/2017       Information Obtained From: Patient  Able to Participate in Assessment/Interview: Yes  Patient Presentation: Responsive  Reason for Admission (Per Patient): Suicidal Ideation, Other (Comments)(Feeling bad; My health is bad (Breathing))  Patient Stressors: Other (Comment)(Health)  Coping Skills:   Talk, Prayer, Read  Leisure Interests (2+):  Individual - Reading  Frequency of Recreation/Participation: Weekly  Awareness of Community Resources:  No  Community Resources:     Current Use:    If no, Barriers?:    Expressed Interest in [employer] Information: Yes  [place]  Patient Main Form of Transportation: Set designer  Patient Strengths:  Nice; I make good decision  Patient Identified Areas of Improvement:  Work on mental health  Patient Goal for Hospitalization:  To get out patient  Current SI (including self-harm):  No  Current HI:  No  Current AVH: Yes  Staff Intervention Plan: Group Attendance, Collaborate with Interdisciplinary Treatment Team  Consent to Intern Participation: N/A  Eleesha Purkey 08/20/2017, 2:39 PM

## 2017-08-20 NOTE — Plan of Care (Addendum)
Patient found on phone upon my arrival. Patient is visible and somewhat social this evening. Patient reports some improvement in mood. Affect is appropriate. Speech is soft. Patient is polite and pleasant. Denies all complaints including SI/HI/AVH. Reports eating and voiding adequately. Compliant with HS medications and staff direction. Given Trazodone for sleep with positive results. Q 15 minute checks maintained. Will continue to monitor throughout the shift. Patient slept 7.5 hours. No apparent distress. Complains of left ankle pain 8/10, given Tramadol. Will monitor for efficacy. Compliant with am medications. Will endorse care to oncoming shift.  Problem: Education: Goal: Knowledge of [facility] Education information/materials will improve Outcome: Progressing   Problem: Activity: Goal: Sleeping patterns will improve Outcome: Progressing   Problem: Health Behavior/Discharge Planning: Goal: Compliance with treatment plan for underlying cause of condition will improve Outcome: Progressing   Problem: Safety: Goal: Periods of time without injury will increase Outcome: Progressing   Problem: Education: Goal: Ability to make informed decisions regarding treatment will improve Outcome: Progressing   Problem: Medication: Goal: Compliance with prescribed medication regimen will improve Outcome: Progressing   Problem: Self-Concept: Goal: Will verbalize positive feelings about self Outcome: Progressing   Problem: Coping: Goal: Coping ability will improve Outcome: Progressing Goal: Will verbalize feelings Outcome: Progressing

## 2017-08-20 NOTE — Plan of Care (Signed)
Patient verbalizes understanding of the general information that's been provided to her and all questions/concerns have been addressed and answered at this time. Patient states that she slept "very well" last night. Patient has been in compliance with her prescribed medication/treatment plan and has been free from injury thus far on the unit. Patient has the ability to make informed decisions and has been observed attending/participating in unit groups. Patient's goal for today is to "let people know I'm here". Patient states that her depression is "not as bad, a little better". Patient rates her anxiety a "6/10" stating that she's always anxious. Patient remains safe on the unit at this time.  Problem: Education: Goal: Knowledge of [facility] Education information/materials will improve Outcome: Progressing   Problem: Activity: Goal: Sleeping patterns will improve Outcome: Progressing   Problem: Health Behavior/Discharge Planning: Goal: Compliance with treatment plan for underlying cause of condition will improve Outcome: Progressing   Problem: Safety: Goal: Periods of time without injury will increase Outcome: Progressing   Problem: Education: Goal: Ability to make informed decisions regarding treatment will improve Outcome: Progressing   Problem: Medication: Goal: Compliance with prescribed medication regimen will improve Outcome: Progressing   Problem: Self-Concept: Goal: Will verbalize positive feelings about self Outcome: Progressing   Problem: Activity: Goal: Will identify at least one activity in which they can participate Outcome: Progressing   Problem: Self-Concept: Goal: Will verbalize positive feelings about self Outcome: Progressing   Problem: Coping: Goal: Coping ability will improve Outcome: Progressing Goal: Will verbalize feelings Outcome: Progressing   Problem: Health Behavior/Discharge Planning: Goal: Compliance with therapeutic regimen will  improve Outcome: Progressing

## 2017-08-20 NOTE — BHH Group Notes (Signed)
LCSW Group Therapy Note   08/20/2017  Time: 1PM  Type of Therapy/Topic:  Group Therapy:  Balance in Life  Participation Level:  Did Not Attend  Description of Group:   This group will address the concept of balance and how it feels and looks when one is unbalanced. Patients will be encouraged to process areas in their lives that are out of balance and identify reasons for remaining unbalanced. Facilitators will guide patients in utilizing problem-solving interventions to address and correct the stressor making their life unbalanced. Understanding and applying boundaries will be explored and addressed for obtaining and maintaining a balanced life. Patients will be encouraged to explore ways to assertively make their unbalanced needs known to significant others in their lives, using other group members and facilitator for support and feedback.  Therapeutic Goals: 1. Patient will identify two or more emotions or situations they have that consume much of in their lives. 2. Patient will identify signs/triggers that life has become out of balance:  3. Patient will identify two ways to set boundaries in order to achieve balance in their lives:  4. Patient will demonstrate ability to communicate their needs through discussion and/or role plays  Summary of Patient Progress: Pt was invited to attend group but chose not to attend. CSW will continue to encourage pt to attend group throughout their admission.    Therapeutic Modalities:   Cognitive Behavioral Therapy Solution-Focused Therapy Assertiveness Training  Heidi Dach, MSW, LCSW Clinical Social Worker 08/20/2017 1:57 PM

## 2017-08-20 NOTE — Progress Notes (Signed)
D- Patient alert and oriented. Patient presents in a pleasant mood on assessment stating that she slept "very well" last night and did not voice any major complaints to this writer at this time. Patient rates her right hip pain an "8/10" reporting to this writer that she had surgery on her hips and the pain is coming back, patient did request pain medication from this Clinical research associate. Patient reports AVH "I see bugs on the walls and I hear the radio". Patient rates her depression a "7/10" on her self-inventory, but stated to this writer that it's "not as bad, a little better". Patient states that she is "always anxious" rating her anxiety a "6/10". Patient denies SI/HI at this time. Patient's goal for today is to "let people know I'm in here".  A- Scheduled medications administered to patient, per MD orders. Support and encouragement provided.  Routine safety checks conducted every 15 minutes.  Patient informed to notify staff with problems or concerns.  R- No adverse drug reactions noted. Patient contracts for safety at this time. Patient compliant with medications and treatment plan. Patient receptive, calm, and cooperative. Patient interacts well with others on the unit.  Patient remains safe at this time.

## 2017-08-21 LAB — BASIC METABOLIC PANEL
Anion gap: 8 (ref 5–15)
BUN: 14 mg/dL (ref 6–20)
CO2: 31 mmol/L (ref 22–32)
Calcium: 9 mg/dL (ref 8.9–10.3)
Chloride: 105 mmol/L (ref 98–111)
Creatinine, Ser: 0.68 mg/dL (ref 0.44–1.00)
GFR calc Af Amer: >60 mL/min (ref >60)
GFR calc non Af Amer: >60 mL/min (ref >60)
Glucose, Bld: 84 mg/dL (ref 70–99)
Potassium: 3.5 mmol/L (ref 3.5–5.1)
Sodium: 144 mmol/L (ref 135–145)

## 2017-08-21 MED ORDER — OXYCODONE HCL 5 MG PO TABS
5.0000 mg | ORAL_TABLET | Freq: Two times a day (BID) | ORAL | Status: DC | PRN
  Administered 2017-08-21 – 2017-08-26 (×10): 5 mg via ORAL
  Filled 2017-08-21 (×10): qty 1

## 2017-08-21 MED ORDER — SIMETHICONE 80 MG PO CHEW
80.0000 mg | CHEWABLE_TABLET | Freq: Two times a day (BID) | ORAL | Status: DC | PRN
  Administered 2017-08-21 – 2017-08-24 (×3): 80 mg via ORAL
  Filled 2017-08-21 (×4): qty 1

## 2017-08-21 NOTE — Tx Team (Addendum)
Interdisciplinary Treatment and Diagnostic Plan Update  08/21/2017 Time of Session:11am Kirsten Richardson MRN: [id number]  Principal Diagnosis: Bipolar 2 disorder, major depressive episode (HCC)  Secondary Diagnoses: Principal Problem:   Bipolar 2 disorder, major depressive episode (HCC) Active Problems:   Borderline personality disorder (HCC)   Current Medications:  Current Facility-Administered Medications  Medication Dose Route Frequency Provider Last Rate Last Dose  . acetaminophen (TYLENOL) tablet 650 mg  650 mg Oral Q6H PRN Clapacs, Jackquline Denmark, MD   650 mg at 08/20/17 1844  . albuterol (PROVENTIL HFA;VENTOLIN HFA) 108 (90 Base) MCG/ACT inhaler 2 puff  2 puff Inhalation Q6H PRN Clapacs, Jackquline Denmark, MD   2 puff at 08/20/17 1304  . alum & mag hydroxide-simeth (MAALOX/MYLANTA) 200-200-20 MG/5ML suspension 30 mL  30 mL Oral Q4H PRN Clapacs, John T, MD      . buPROPion (WELLBUTRIN XL) 24 hr tablet 300 mg  300 mg Oral Daily McNew, Ileene Hutchinson, MD   300 mg at 08/21/17 0752  . cephALEXin (KEFLEX) capsule 500 mg  500 mg Oral Q12H Clapacs, Jackquline Denmark, MD   500 mg at 08/21/17 0752  . dexmethylphenidate (FOCALIN) tablet 5 mg  5 mg Oral BID Haskell Riling, MD   5 mg at 08/21/17 0752  . famotidine (PEPCID) tablet 20 mg  20 mg Oral Daily PRN Haskell Riling, MD   20 mg at 08/20/17 1744  . fluticasone (FLONASE) 50 MCG/ACT nasal spray 2 spray  2 spray Each Nare Daily Clapacs, Jackquline Denmark, MD   2 spray at 08/21/17 0800  . gabapentin (NEURONTIN) capsule 300 mg  300 mg Oral TID Clapacs, Jackquline Denmark, MD   300 mg at 08/21/17 0752  . hydrochlorothiazide (HYDRODIURIL) tablet 25 mg  25 mg Oral Daily Clapacs, John T, MD   25 mg at 08/21/17 4268  . hydrOXYzine (ATARAX/VISTARIL) tablet 50 mg  50 mg Oral TID PRN McNew, Ileene Hutchinson, MD      . levothyroxine (SYNTHROID, LEVOTHROID) tablet 50 mcg  50 mcg Oral QAC breakfast McNew, Ileene Hutchinson, MD   50 mcg at 08/21/17 0553  . loratadine (CLARITIN) tablet 10 mg  10 mg Oral Daily Clapacs, Jackquline Denmark, MD    10 mg at 08/21/17 0753  . magnesium hydroxide (MILK OF MAGNESIA) suspension 30 mL  30 mL Oral Daily PRN Clapacs, John T, MD      . menthol-cetylpyridinium (CEPACOL) lozenge 3 mg  1 lozenge Oral PRN McNew, Ileene Hutchinson, MD      . mometasone-formoterol (DULERA) 200-5 MCG/ACT inhaler 2 puff  2 puff Inhalation BID Clapacs, Jackquline Denmark, MD   2 puff at 08/20/17 1950  . [COMPLETED] nicotine (NICODERM CQ - dosed in mg/24 hours) patch 21 mg  21 mg Transdermal Once Clapacs, Jackquline Denmark, MD   21 mg at 08/21/17 0855  . ondansetron (ZOFRAN) tablet 4 mg  4 mg Oral TID PC McNew, Ileene Hutchinson, MD   4 mg at 08/21/17 0750  . oxyCODONE (Oxy IR/ROXICODONE) immediate release tablet 5 mg  5 mg Oral BID PRN Haskell Riling, MD   5 mg at 08/21/17 1020  . pantoprazole (PROTONIX) EC tablet 20 mg  20 mg Oral BID PRN Haskell Riling, MD   20 mg at 08/20/17 1844  . phenazopyridine (PYRIDIUM) tablet 100 mg  100 mg Oral TID WC McNew, Ileene Hutchinson, MD   100 mg at 08/21/17 0754  . risperiDONE (RISPERDAL) tablet 2 mg  2 mg Oral QHS Clapacs, Jackquline Denmark, MD  2 mg at 08/20/17 2104  . simethicone (MYLICON) chewable tablet 80 mg  80 mg Oral BID PRN McNew, Holly R, MD      . sucralfate (CARAFATE) tablet 1 g  1 g Oral TID WC & HS Clapacs, Jackquline Denmark, MD   1 g at 08/21/17 0755  . tiotropium (SPIRIVA) inhalation capsule 18 mcg  18 mcg Inhalation Daily Clapacs, Jackquline Denmark, MD   18 mcg at 08/20/17 0947  . traMADol (ULTRAM) tablet 50 mg  50 mg Oral Q6H PRN Clapacs, Jackquline Denmark, MD   50 mg at 08/21/17 0552  . traZODone (DESYREL) tablet 100 mg  100 mg Oral QHS PRN Clapacs, Jackquline Denmark, MD   100 mg at 08/20/17 2138  . venlafaxine [facility]) tablet 75 mg  75 mg Oral BH-q7a McNew, Ileene Hutchinson, MD   75 mg at 08/21/17 7253   And  . venlafaxine [facility]) tablet 37.5 mg  37.5 mg Oral BID Haskell Riling, MD   37.5 mg at 08/21/17 6644   PTA Medications: Medications Prior to Admission  Medication Sig Dispense Refill Last Dose  . albuterol (PROVENTIL HFA;VENTOLIN HFA) 108 (90 Base) MCG/ACT inhaler Inhale  into the lungs.   prn at prn  . budesonide-formoterol (SYMBICORT) 160-4.5 MCG/ACT inhaler Inhale 2 puffs into the lungs 2 (two) times daily.   08/19/2017 at 0600  . budesonide-formoterol (SYMBICORT) 160-4.5 MCG/ACT inhaler Inhale 2 puffs into the lungs 2 (two) times daily.   Not Taking at Unknown time  . buPROPion (WELLBUTRIN XL) 150 MG 24 hr tablet Take 1 tablet (150 mg total) by mouth daily. 30 tablet 0 08/19/2017 at Unknown time  . cetirizine (ZYRTEC) 10 MG tablet Take 10 mg by mouth daily.   08/19/2017 at 0600  . clindamycin (CLEOCIN T) 1 % lotion Apply topically 2 (two) times daily as needed.   Not Taking at Unknown time  . diclofenac sodium (VOLTAREN) 1 % GEL Apply topically 4 (four) times daily.   08/19/2017 at Unknown time  . famotidine (PEPCID) 20 MG tablet Take by mouth.   prn at prn  . fluticasone (FLONASE) 50 MCG/ACT nasal spray SHAKE LIQUID AND USE 2 SPRAYS IN EACH NOSTRIL EVERY DAY AS NEEDED FOR RHINITIS OR ALLERGIES   08/19/2017 at Unknown time  . gabapentin (NEURONTIN) 600 MG tablet Take 0.5 tablets (300 mg total) by mouth 3 (three) times daily. 45 tablet 0 08/19/2017 at 1200  . hydrochlorothiazide (HYDRODIURIL) 25 MG tablet Take 25 mg by mouth daily.   08/19/2017 at 0600  . ibuprofen (ADVIL,MOTRIN) 800 MG tablet Take 800 mg by mouth every 8 (eight) hours as needed.   Not Taking at Unknown time  . levonorgestrel-ethinyl estradiol (NORDETTE) 0.15-30 MG-MCG tablet Take 1 tablet by mouth daily.   08/19/2017 at 0600  . levothyroxine (SYNTHROID, LEVOTHROID) 50 MCG tablet TAKE 1 TABLET(50 MCG) BY MOUTH EVERY DAY 30 TO 60 MINUTES BEFORE BREAKFAST ON AN EMPTY STOMACH AND WITH A GLASS OF WATER   08/19/2017 at 0600  . lisinopril (PRINIVIL,ZESTRIL) 10 MG tablet Take 10 mg by mouth daily.   08/19/2017 at 0600  . methocarbamol (ROBAXIN) 500 MG tablet Take 1 tablet by mouth 2 (two) times daily.   08/19/2017 at 0600  . mirabegron ER (MYRBETRIQ) 25 MG TB24 tablet TAKE 1 TABLET BY MOUTH DAILY   08/19/2017 at 0600   . naloxegol oxalate (MOVANTIK) 12.5 MG TABS tablet Take 25 mg by mouth daily.   08/19/2017 at 0600  . norethindrone (MICRONOR,CAMILA,ERRIN) 0.35 MG tablet  Take by mouth.   Not Taking at Unknown time  . omeprazole (PRILOSEC) 40 MG capsule Take 40 mg by mouth daily.   08/19/2017 at 0600  . oxyCODONE (OXY IR/ROXICODONE) 5 MG immediate release tablet Take 5 mg by mouth every 4 (four) hours as needed.    08/19/2017 at Unknown time  . oxyCODONE-acetaminophen (PERCOCET/ROXICET) 5-325 MG tablet Take 1 tablet by mouth as needed for pain.   Not Taking at Unknown time  . pantoprazole (PROTONIX) 40 MG tablet TAKE 1 TABLET DAILY 2 HOURS AFTER SYNTHROID AND 1 TABLET 30 MINUTES BEFOREDINNER   08/19/2017 at 0600  . polyethylene glycol powder (GLYCOLAX/MIRALAX) powder Take by mouth.   prn at prn  . risperiDONE (RISPERDAL) 2 MG tablet Take 1 tablet (2 mg total) by mouth at bedtime. 30 tablet 0 08/18/2017 at Unknown time  . sucralfate (CARAFATE) 1 g tablet Take 1 tablet by mouth 4 (four) times daily.   08/19/2017 at 1200  . tiotropium (SPIRIVA) 18 MCG inhalation capsule Place 18 mcg into inhaler and inhale daily.    Not Taking at Unknown time  . tiZANidine (ZANAFLEX) 2 MG tablet LIMIT TO 2 TO 3 TS PO PER DAY IF TOLERATED  0 Not Taking at Unknown time  . tolterodine (DETROL LA) 2 MG 24 hr capsule TAKE ONE CAPSULE BY MOUTH ONCE DAILY   08/19/2017 at 0600  . traMADol (ULTRAM) 50 MG tablet Take 1 tablet (50 mg total) by mouth 3 (three) times daily. (Patient taking differently: Take 50 mg by mouth 4 (four) times daily. ) 90 tablet 0 08/19/2017 at Unknown time  . umeclidinium bromide (INCRUSE ELLIPTA) 62.5 MCG/INH AEPB Inhale 1 puff into the lungs daily.   08/19/2017 at Unknown time  . valACYclovir (VALTREX) 1000 MG tablet Take 1,000 mg by mouth as needed.   Not Taking at Unknown time  . venlafaxine (EFFEXOR) 75 MG tablet Take 1 tablet (75 mg total) by mouth 3 (three) times daily with meals. 90 tablet 0 08/19/2017 at 0600     Patient Stressors: Health problems Medication change or noncompliance  Patient Strengths: Ability for insight Average or above average intelligence Capable of independent living Motivation for treatment/growth Supportive family/friends  Treatment Modalities: Medication Management, Group therapy, Case management,  1 to 1 session with clinician, Psychoeducation, Recreational therapy.   Physician Treatment Plan for Primary Diagnosis: Bipolar 2 disorder, major depressive episode (HCC) Long Term Goal(s):     Short Term Goals:    Medication Management: Evaluate patient's response, side effects, and tolerance of medication regimen.  Therapeutic Interventions: 1 to 1 sessions, Unit Group sessions and Medication administration.  Evaluation of Outcomes: Progressing  Physician Treatment Plan for Secondary Diagnosis: Principal Problem:   Bipolar 2 disorder, major depressive episode (HCC) Active Problems:   Borderline personality disorder (HCC)  Long Term Goal(s):     Short Term Goals:       Medication Management: Evaluate patient's response, side effects, and tolerance of medication regimen.  Therapeutic Interventions: 1 to 1 sessions, Unit Group sessions and Medication administration.  Evaluation of Outcomes: Progressing   RN Treatment Plan for Primary Diagnosis: Bipolar 2 disorder, major depressive episode (HCC) Long Term Goal(s): Knowledge of disease and therapeutic regimen to maintain health will improve  Short Term Goals: Ability to remain free from injury will improve, Ability to identify and develop effective coping behaviors will improve and Compliance with prescribed medications will improve  Medication Management: RN will administer medications as ordered by provider, will assess and evaluate  patient's response and provide education to patient for prescribed medication. RN will report any adverse and/or side effects to prescribing provider.  Therapeutic  Interventions: 1 on 1 counseling sessions, Psychoeducation, Medication administration, Evaluate responses to treatment, Monitor vital signs and CBGs as ordered, Perform/monitor CIWA, COWS, AIMS and Fall Risk screenings as ordered, Perform wound care treatments as ordered.  Evaluation of Outcomes: Progressing   LCSW Treatment Plan for Primary Diagnosis: Bipolar 2 disorder, major depressive episode (HCC) Long Term Goal(s): Safe transition to appropriate next level of care at discharge, Engage patient in therapeutic group addressing interpersonal concerns.  Short Term Goals: Engage patient in aftercare planning with referrals and resources, Increase social support, Increase ability to appropriately verbalize feelings, Identify triggers associated with mental health/substance abuse issues and Increase skills for wellness and recovery  Therapeutic Interventions: Assess for all discharge needs, 1 to 1 time with Social worker, Explore available resources and support systems, Assess for adequacy in community support network, Educate family and significant other(s) on suicide prevention, Complete Psychosocial Assessment, Interpersonal group therapy.  Evaluation of Outcomes: Progressing   Progress in Treatment: Attending groups: Yes. Participating in groups: Yes. Taking medication as prescribed: Yes. Toleration medication: Yes. Family/Significant other contact made: No, will contact:  Patient refused Patient understands diagnosis: Yes. Discussing patient identified problems/goals with staff: Yes. Medical problems stabilized or resolved: Yes. Denies suicidal/homicidal ideation: Yes. Issues/concerns per patient self-inventory: No. Other:   New problem(s) identified: No, Describe:  None  New Short Term/Long Term Goal(s): "To get more focused and find more ways to cope so that I can have more hope."  Patient Goals:   "To get more focused and find more ways to cope so that I can have more  hope."  Discharge Plan or Barriers: To return home and follow up with Peer support at [facility] and [facility] for medication management.  Reason for Continuation of Hospitalization: Depression Medication stabilization  Estimated Length of Stay: 3-5 days  Recreational Therapy: Patient Stressors: Health   Patient Goal: Patient will identify 3 positive coping skills strategies to use post d/c within 5 recreation therapy group sessions  Attendees: Patient: Kirsten Richardson 08/21/2017 11:19 AM  Physician: Corinna Gab, MD 08/21/2017 11:19 AM  Nursing: Milas Hock, RN 08/21/2017 11:19 AM  RN Care Manager: 08/21/2017 11:19 AM  Social Worker: Johny Shears, LCSWA 08/21/2017 11:19 AM  Recreational Therapist: Danella Deis. Dreama Saa, LRT 08/21/2017 11:19 AM  Other: Heidi Dach, LCSW 08/21/2017 11:19 AM  Other: Huey Romans, LCSW 08/21/2017 11:19 AM  Other: 08/21/2017 11:19 AM    Scribe for Treatment Team: Johny Shears, LCSW 08/21/2017 11:19 AM

## 2017-08-21 NOTE — [facility] Group Notes (Signed)
  08/21/2017  Time: 1PM  Type of Therapy and Topic:  Group Therapy:  Feelings around Relapse and Recovery  Participation Level:  Did Not Attend   Description of Group:    Patients in this group will discuss emotions they experience before and after a relapse. They will process how experiencing these feelings, or avoidance of experiencing them, relates to having a relapse. Facilitator will guide patients to explore emotions they have related to recovery. Patients will be encouraged to process which emotions are more powerful. They will be guided to discuss the emotional reaction significant others in their lives may have to their relapse or recovery. Patients will be assisted in exploring ways to respond to the emotions of others without this contributing to a relapse.  Therapeutic Goals: 1. Patient will identify two or more emotions that lead to a relapse for them 2. Patient will identify two emotions that result when they relapse 3. Patient will identify two emotions related to recovery 4. Patient will demonstrate ability to communicate their needs through discussion and/or role plays   Summary of Patient Progress: Pt was invited to attend group but chose not to attend. CSW will continue to encourage pt to attend group throughout their admission.     Therapeutic Modalities:   Cognitive Behavioral Therapy Solution-Focused Therapy Assertiveness Training Relapse Prevention Therapy  Heidi Dach, MSW, LCSW Clinical Social Worker 08/21/2017 1:34 PM

## 2017-08-21 NOTE — Progress Notes (Signed)
[facility] MD Progress Note  08/21/2017 12:28 PM Kirsten Richardson  MRN:  [id number] Subjective:  Pt states that she is starting to feel much better. Her mood feels more stable and less depressed. She has been going to groups and journal more which really helps her. She states that she has more hope. She plans to continue to try to lose weight so she feels better about herself. She did talk to her ex-boyfriend which went well and he is going to try to visit. She is glad to have decreased Effexor and feels like "i have emotions again. I have been smiling today." She states that SI is improving. She states, "They still come and go but I can ignore them better." She still has some mild AH of "music playing" but not bothersome. She has brighter affect. She is sleeping and eating well. She has been calm and pleasant on the unit. Denies feeling jittery or anxious.   Principal Problem: Bipolar 2 disorder, major depressive episode ([place]) Diagnosis:   Patient Active Problem List   Diagnosis Date Noted  . Bipolar 2 disorder, major depressive episode ([place]) [F31.81] 08/25/2016    Priority: High  . Borderline personality disorder ([place]) [F60.3] 09/27/2014    Priority: High  . Peptic ulcer [K27.9] 08/19/2017  . COPD (chronic obstructive pulmonary disease) ([place]) [J44.9] 08/26/2016  . Constipation [K59.00] 08/26/2016  . Essential hypertension [I10] 08/31/2015  . DDD (degenerative disc disease), lumbar [M51.36] 08/02/2015  . DDD (degenerative disc disease), cervical [M50.30] 08/02/2015  . 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 Hip pain, chronic [M25.559, G89.29] 09/27/2014  . SUI (stress urinary incontinence, female) [N39.3] 01/02/2014   Total Time spent with patient: 20 minutes  Past Psychiatric History: See h&P  Past Medical History:  Past Medical History:  Diagnosis Date  . Anemia   . Arthritis   . Back pain   . Bipolar disorder ([place])   . Borderline personality disorder  ([place])   . Carpal tunnel syndrome   . Degenerative disc disease, lumbar   . Depression   . Eczema   . Emphysema of lung ([place])   . Emphysema of lung ([place])   . GERD (gastroesophageal reflux disease)   . History of hemorrhoids   . Hypertension   . Hypothyroidism   . Neck pain   . Plantar fasciitis   . Plantar fasciitis   . Scoliosis   . SUI (stress urinary incontinence, female)   . Thyroid disease   . Vitamin B 12 deficiency     Past Surgical History:  Procedure Laterality Date  . CARPAL TUNNEL RELEASE Right 2018   then left done a few weeks later  . COLONOSCOPY WITH PROPOFOL N/A 04/02/2017   Procedure: COLONOSCOPY WITH PROPOFOL;  Surgeon: Lollie Sails, MD;  Location: [facility] ENDOSCOPY;  Service: Endoscopy;  Laterality: N/A;  . ESOPHAGOGASTRODUODENOSCOPY (EGD) WITH PROPOFOL N/A 07/21/2017   Procedure: ESOPHAGOGASTRODUODENOSCOPY (EGD) WITH PROPOFOL;  Surgeon: Lollie Sails, MD;  Location: [facility] ENDOSCOPY;  Service: Endoscopy;  Laterality: N/A;  . FOOT SURGERY Right    plantar fasciatis  . HIP FRACTURE SURGERY Bilateral   . TUBAL LIGATION    . WISDOM TOOTH EXTRACTION  09/2005   Family History:  Family History  Problem Relation Age of Onset  . Arthritis Mother   . COPD Mother   . Cancer Mother   . Depression Mother   . Early death Mother   . Vision loss Mother   . Mental illness Mother   .  Alcohol abuse Father   . Arthritis Father   . Cancer Father   . Diabetes Father   . Drug abuse Father   . Early death Father   . Vision loss Father   . Heart disease Father   . Hypertension Father   . Mental illness Father   . Stroke Father   . Lung cancer Sister   . Pancreatitis Brother   . Hypertension Brother   . Diabetes Brother   . Diverticulitis Brother   . Cirrhosis Brother   . Hypertension Paternal Grandmother   . Heart disease Paternal Grandmother    Family Psychiatric  History: See h&P Social History:  Social History   Substance and Sexual Activity   Alcohol Use No  . Alcohol/week: 0.0 standard drinks     Social History   Substance and Sexual Activity  Drug Use No    Social History   Socioeconomic History  . Marital status: Single    Spouse name: Not on file  . Number of children: Not on file  . Years of education: Not on file  . Highest education level: Not on file  Occupational History  . Not on file  Social Needs  . Financial resource strain: Not on file  . Food insecurity:    Worry: Not on file    Inability: Not on file  . Transportation needs:    Medical: Not on file    Non-medical: Not on file  Tobacco Use  . Smoking status: Former Smoker    Packs/day: 0.20    Years: 5.00    Pack years: 1.00    Types: Cigarettes    Last attempt to quit: 07/25/2015    Years since quitting: 2.0  . Smokeless tobacco: Never Used  Substance and Sexual Activity  . Alcohol use: No    Alcohol/week: 0.0 standard drinks  . Drug use: No  . Sexual activity: Yes    Birth control/protection: Pill  Lifestyle  . Physical activity:    Days per week: Not on file    Minutes per session: Not on file  . Stress: Not on file  Relationships  . Social connections:    Talks on phone: Not on file    Gets together: Not on file    Attends religious service: Not on file    Active member of club or organization: Not on file    Attends meetings of clubs or organizations: Not on file    Relationship status: Not on file  Other Topics Concern  . Not on file  Social History Narrative  . Not on file   Additional Social History:                         Sleep: Good  Appetite:  Good  Current Medications: Current Facility-Administered Medications  Medication Dose Route Frequency Provider Last Rate Last Dose  . acetaminophen (TYLENOL) tablet 650 mg  650 mg Oral Q6H PRN Clapacs, Madie Reno, MD   650 mg at 08/20/17 1844  . albuterol (PROVENTIL HFA;VENTOLIN HFA) 108 (90 Base) MCG/ACT inhaler 2 puff  2 puff Inhalation Q6H PRN Clapacs, Madie Reno,  MD   2 puff at 08/20/17 1304  . alum & mag hydroxide-simeth (MAALOX/MYLANTA) 200-200-20 MG/5ML suspension 30 mL  30 mL Oral Q4H PRN Clapacs, John T, MD      . buPROPion (WELLBUTRIN XL) 24 hr tablet 300 mg  300 mg Oral Daily Villa Burgin, Tyson Babinski, MD   300  mg at 08/21/17 0752  . cephALEXin (KEFLEX) capsule 500 mg  500 mg Oral Q12H Clapacs, Madie Reno, MD   500 mg at 08/21/17 0752  . dexmethylphenidate (FOCALIN) tablet 5 mg  5 mg Oral BID Marylin Crosby, MD   5 mg at 08/21/17 0752  . famotidine (PEPCID) tablet 20 mg  20 mg Oral Daily PRN Marylin Crosby, MD   20 mg at 08/20/17 1744  . fluticasone (FLONASE) 50 MCG/ACT nasal spray 2 spray  2 spray Each Nare Daily Clapacs, Madie Reno, MD   2 spray at 08/21/17 0800  . gabapentin (NEURONTIN) capsule 300 mg  300 mg Oral TID Clapacs, Madie Reno, MD   300 mg at 08/21/17 0752  . hydrochlorothiazide (HYDRODIURIL) tablet 25 mg  25 mg Oral Daily Clapacs, John T, MD   25 mg at 08/21/17 5784  . hydrOXYzine (ATARAX/VISTARIL) tablet 50 mg  50 mg Oral TID PRN Sebasthian Stailey, Tyson Babinski, MD      . levothyroxine (SYNTHROID, LEVOTHROID) tablet 50 mcg  50 mcg Oral QAC breakfast Jessiah Steinhart, Tyson Babinski, MD   50 mcg at 08/21/17 0553  . loratadine (CLARITIN) tablet 10 mg  10 mg Oral Daily Clapacs, Madie Reno, MD   10 mg at 08/21/17 0753  . magnesium hydroxide (MILK OF MAGNESIA) suspension 30 mL  30 mL Oral Daily PRN Clapacs, John T, MD      . menthol-cetylpyridinium (CEPACOL) lozenge 3 mg  1 lozenge Oral PRN Debbie Bellucci, Tyson Babinski, MD      . mometasone-formoterol (DULERA) 200-5 MCG/ACT inhaler 2 puff  2 puff Inhalation BID Clapacs, Madie Reno, MD   2 puff at 08/20/17 1950  . [COMPLETED] nicotine (NICODERM CQ - dosed in mg/24 hours) patch 21 mg  21 mg Transdermal Once Clapacs, Madie Reno, MD   21 mg at 08/21/17 0855  . ondansetron (ZOFRAN) tablet 4 mg  4 mg Oral TID PC Graeme Menees, Tyson Babinski, MD   4 mg at 08/21/17 0750  . oxyCODONE (Oxy IR/ROXICODONE) immediate release tablet 5 mg  5 mg Oral BID PRN Marylin Crosby, MD   5 mg at 08/21/17 1020   . pantoprazole (PROTONIX) EC tablet 20 mg  20 mg Oral BID PRN Marylin Crosby, MD   20 mg at 08/20/17 1844  . phenazopyridine (PYRIDIUM) tablet 100 mg  100 mg Oral TID WC Brandilee Pies, Tyson Babinski, MD   100 mg at 08/21/17 0754  . risperiDONE (RISPERDAL) tablet 2 mg  2 mg Oral QHS Clapacs, Madie Reno, MD   2 mg at 08/20/17 2104  . simethicone (MYLICON) chewable tablet 80 mg  80 mg Oral BID PRN Riverlyn Kizziah R, MD      . sucralfate (CARAFATE) tablet 1 g  1 g Oral TID WC & HS Clapacs, Madie Reno, MD   1 g at 08/21/17 0755  . tiotropium (SPIRIVA) inhalation capsule 18 mcg  18 mcg Inhalation Daily Clapacs, Madie Reno, MD   18 mcg at 08/20/17 0947  . traMADol (ULTRAM) tablet 50 mg  50 mg Oral Q6H PRN Clapacs, Madie Reno, MD   50 mg at 08/21/17 0552  . traZODone (DESYREL) tablet 100 mg  100 mg Oral QHS PRN Clapacs, Madie Reno, MD   100 mg at 08/20/17 2138  . venlafaxine [facility]) tablet 75 mg  75 mg Oral BH-q7a Baruc Tugwell, Tyson Babinski, MD   75 mg at 08/21/17 0553   And  . venlafaxine [facility]) tablet 37.5 mg  37.5 mg Oral BID Jaymin Waln, Tyson Babinski,  MD   37.5 mg at 08/21/17 0712    Lab Results:  Results for orders placed or performed during the hospital encounter of 08/19/17 (from the past 48 hour(s))  Hemoglobin A1c     Status: None   Collection Time: 08/19/17  4:09 PM  Result Value Ref Range   Hgb A1c MFr Bld 4.8 4.8 - 5.6 %    Comment: (NOTE) Pre diabetes:          5.7%-6.4% Diabetes:              >6.4% Glycemic control for   <7.0% adults with diabetes    Mean Plasma Glucose 91.06 mg/dL    Comment: Performed at [facility], [place] [address], [place], [place] [address]  Lipid panel     Status: Abnormal   Collection Time: 08/19/17  4:09 PM  Result Value Ref Range   Cholesterol 157 0 - 200 mg/dL   Triglycerides 341 (H) <150 mg/dL   HDL 40 (L) >40 mg/dL   Total CHOL/HDL Ratio 3.9 RATIO   VLDL 68 (H) 0 - 40 mg/dL   LDL Cholesterol 49 0 - 99 mg/dL    Comment:        Total Cholesterol/HDL:CHD Risk Coronary Heart Disease Risk  Table                     Men   Women  1/2 Average Risk   3.4   3.3  Average Risk       5.0   4.4  2 X Average Risk   9.6   7.1  3 X Average Risk  23.4   11.0        Use the calculated Patient Ratio above and the CHD Risk Table to determine the patient's CHD Risk.        ATP III CLASSIFICATION (LDL):  <100     mg/dL   Optimal  100-129  mg/dL   Near or Above                    Optimal  130-159  mg/dL   Borderline  160-189  mg/dL   High  >190     mg/dL   Very High Performed at [facility], [address] [place], [place] [address]   TSH     Status: None   Collection Time: 08/19/17  4:09 PM  Result Value Ref Range   TSH 0.581 0.350 - 4.500 uIU/mL    Comment: Performed by a 3rd Generation assay with a functional sensitivity of <=0.01 uIU/mL. Performed at [facility], [address] [place], [place] [address]   Basic metabolic panel     Status: None   Collection Time: 08/21/17  7:12 AM  Result Value Ref Range   Sodium 144 135 - 145 mmol/L   Potassium 3.5 3.5 - 5.1 mmol/L   Chloride 105 98 - 111 mmol/L   CO2 31 22 - 32 mmol/L   Glucose, Bld 84 70 - 99 mg/dL   BUN 14 6 - 20 mg/dL   Creatinine, Ser 0.68 0.44 - 1.00 mg/dL   Calcium 9.0 8.9 - 10.3 mg/dL   GFR calc non Af Amer >60 >60 mL/min   GFR calc Af Amer >60 >60 mL/min    Comment: (NOTE) The eGFR has been calculated using the CKD EPI equation. This calculation has not been validated in all clinical situations. eGFR's persistently <60 mL/min signify possible Chronic Kidney Disease.  Anion gap 8 5 - 15    Comment: Performed at [facility], [address] [place], [place] [address]    Blood Alcohol level:  Lab Results  Component Value Date   [facility] <10 08/19/2017   ETH <5 99/37/1696    Metabolic Disorder Labs: Lab Results  Component Value Date   HGBA1C 4.8 08/19/2017   MPG 91.06 08/19/2017   MPG 114.02 08/26/2016   No results found for: PROLACTIN Lab Results  Component Value  Date   CHOL 157 08/19/2017   TRIG 341 (H) 08/19/2017   HDL 40 (L) 08/19/2017   CHOLHDL 3.9 08/19/2017   VLDL 68 (H) 08/19/2017   LDLCALC 49 08/19/2017   LDLCALC 125 (H) 08/26/2016    Physical Findings: AIMS:  , ,  ,  ,    CIWA:    COWS:     Musculoskeletal: Strength & Muscle Tone: within normal limits Gait & Station: normal Patient leans: N/A  Psychiatric Specialty Exam: Physical Exam  Nursing note and vitals reviewed.   Review of Systems  Musculoskeletal: Positive for joint pain.  All other systems reviewed and are negative.   Blood pressure 110/60, pulse 95, temperature 98.6 F (37 C), temperature source Oral, resp. rate 18, height 5' 0.5" (1.537 m), weight 85.3 kg, SpO2 93 %.Body mass index is 36.11 kg/m.  General Appearance: Casual  Eye Contact:  Good  Speech:  Clear and Coherent  Volume:  Normal  Mood:  Euthymic  Affect:  Congruent  Thought Process:  Coherent and Goal Directed  Orientation:  Full (Time, Place, and Person)  Thought Content:  Logical  Suicidal Thoughts:  No  Homicidal Thoughts:  No  Memory:  Immediate;   Poor  Judgement:  Fair  Insight:  Fair  Psychomotor Activity:  Normal  Concentration:  Concentration: Fair  Recall:  [employer] of Knowledge:  Fair  Language:  Fair  Akathisia:  No      Assets:  Resilience  ADL's:  Intact  Cognition:  WNL  Sleep:  Number of Hours: 7.5     Treatment Plan Summary: 42 yo female admitted due to SI related to medical issues. She is feeling better now that she is back on her medications. She is feeling more hopeful and SI are resolving. She is doing well in groups and spending a lot of time journaling which helps her. She is tolerating medications well so far.   Plan:  Mood disorder -Continue Risperdal 2 mg qhs -Continue lower dose of Effexor IR of 75 mg qam and 37.5 mg BID -Continue Wellbutrin XL 300 mg daily -Continue Gabapentin to 300 mg TID  HTN -Stable -Continue HCTZ 25 mg daily  Gastric  Ulcer -Sucralfate 1 g TID and qhs  ADHD -Continue Focalin 5 mg BID. Confirmed via prescribing database that last fill was on 8/12  Chronic pain -Continue tramadol -Oxycodone 5 mg BID prn. Confirmed via [facility] that she had this filled on 7/30  UTI -Continue Keflex 500 mg BID -Continue Pyridine 100 mg TID  Hypokalemia -Resolved -K today is 3.5  COPD -Spiriva, Flonase, and Dulera  Dispo -She will return home on discharge     Marylin Crosby, MD 08/21/2017, 12:28 PM

## 2017-08-21 NOTE — Plan of Care (Signed)
Patient is alert and oriented X 4; denies being SI, HI but presently hearing voices. Patient states the voices are not bad just sounds like a radio, and she has always had this issue. Patient is very cautious, hypervigilant around the nurses station. Patient is pleasant, but not attending groups thus far. Nurse will continue to monitor. Problem: Education: Goal: Knowledge of [place] Education information/materials will improve Outcome: Not Progressing   Problem: Health Behavior/Discharge Planning: Goal: Compliance with treatment plan for underlying cause of condition will improve Outcome: Not Progressing   Problem: Safety: Goal: Periods of time without injury will increase Outcome: Not Progressing

## 2017-08-21 NOTE — Progress Notes (Signed)
Recreation Therapy Notes  Date: 08/21/2017  Time: 9:30 am   Location: Craft Room   Behavioral response: N/A   Intervention Topic: Leisure Skills  Discussion/Intervention: Patient did not attend group.   Clinical Observations/Feedback:  Patient did not attend group.   Ziara Thelander LRT/CTRS         Codylee Patil 08/21/2017 12:01 PM

## 2017-08-22 NOTE — Progress Notes (Signed)
[facility] MD Progress Note  08/22/2017 12:19 PM Kirsten Richardson  MRN:  [id number] Subjective:  Better" Pt reports feeling better although endorses some depression, reportedly isolative in her room. , med compliant, tolerating well.   Principal Problem: Bipolar 2 disorder, major depressive episode ([place]) Diagnosis:   Patient Active Problem List   Diagnosis Date Noted  . Peptic ulcer [K27.9] 08/19/2017  . COPD (chronic obstructive pulmonary disease) ([place]) [J44.9] 08/26/2016  . Constipation [K59.00] 08/26/2016  . Bipolar 2 disorder, major depressive episode ([place]) [F31.81] 08/25/2016  . Essential hypertension [I10] 08/31/2015  . DDD (degenerative disc disease), lumbar [M51.36] 08/02/2015  . DDD (degenerative disc disease), cervical [M50.30] 08/02/2015  . 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 Hip pain, chronic [M25.559, G89.29] 09/27/2014  . Borderline personality disorder ([place]) [F60.3] 09/27/2014  . SUI (stress urinary incontinence, female) [N39.3] 01/02/2014   Total Time spent with patient: 25 min  Past Psychiatric History: See h&P  Past Medical History:  Past Medical History:  Diagnosis Date  . Anemia   . Arthritis   . Back pain   . Bipolar disorder ([place])   . Borderline personality disorder ([place])   . Carpal tunnel syndrome   . Degenerative disc disease, lumbar   . Depression   . Eczema   . Emphysema of lung ([place])   . Emphysema of lung ([place])   . GERD (gastroesophageal reflux disease)   . History of hemorrhoids   . Hypertension   . Hypothyroidism   . Neck pain   . Plantar fasciitis   . Plantar fasciitis   . Scoliosis   . SUI (stress urinary incontinence, female)   . Thyroid disease   . Vitamin B 12 deficiency     Past Surgical History:  Procedure Laterality Date  . CARPAL TUNNEL RELEASE Right 2018   then left done a few weeks later  . COLONOSCOPY WITH PROPOFOL N/A 04/02/2017   Procedure: COLONOSCOPY WITH PROPOFOL;  Surgeon: Lollie Sails, MD;  Location[facility] ENDOSCOPY;  Service: Endoscopy;  Laterality: N/A;  . ESOPHAGOGASTRODUODENOSCOPY (EGD) WITH PROPOFOL N/A 07/21/2017   Procedure: ESOPHAGOGASTRODUODENOSCOPY (EGD) WITH PROPOFOL;  Surgeon: Lollie Sails, MD;  Location: [facility] ENDOSCOPY;  Service: Endoscopy;  Laterality: N/A;  . FOOT SURGERY Right    plantar fasciatis  . HIP FRACTURE SURGERY Bilateral   . TUBAL LIGATION    . WISDOM TOOTH EXTRACTION  09/2005   Family History:  Family History  Problem Relation Age of Onset  . Arthritis Mother   . COPD Mother   . Cancer Mother   . Depression Mother   . Early death Mother   . Vision loss Mother   . Mental illness Mother   . Alcohol abuse Father   . Arthritis Father   . Cancer Father   . Diabetes Father   . Drug abuse Father   . Early death Father   . Vision loss Father   . Heart disease Father   . Hypertension Father   . Mental illness Father   . Stroke Father   . Lung cancer Sister   . Pancreatitis Brother   . Hypertension Brother   . Diabetes Brother   . Diverticulitis Brother   . Cirrhosis Brother   . Hypertension Paternal Grandmother   . Heart disease Paternal Grandmother    Family Psychiatric  History: See h&P Social History:  Social History   Substance and Sexual Activity  Alcohol Use No  . Alcohol/week: 0.0 standard drinks  Social History   Substance and Sexual Activity  Drug Use No    Social History   Socioeconomic History  . Marital status: Single    Spouse name: Not on file  . Number of children: Not on file  . Years of education: Not on file  . Highest education level: Not on file  Occupational History  . Not on file  Social Needs  . Financial resource strain: Not on file  . Food insecurity:    Worry: Not on file    Inability: Not on file  . Transportation needs:    Medical: Not on file    Non-medical: Not on file  Tobacco Use  . Smoking status: Former Smoker    Packs/day: 0.20    Years: 5.00    Pack  years: 1.00    Types: Cigarettes    Last attempt to quit: 07/25/2015    Years since quitting: 2.0  . Smokeless tobacco: Never Used  Substance and Sexual Activity  . Alcohol use: No    Alcohol/week: 0.0 standard drinks  . Drug use: No  . Sexual activity: Yes    Birth control/protection: Pill  Lifestyle  . Physical activity:    Days per week: Not on file    Minutes per session: Not on file  . Stress: Not on file  Relationships  . Social connections:    Talks on phone: Not on file    Gets together: Not on file    Attends religious service: Not on file    Active member of club or organization: Not on file    Attends meetings of clubs or organizations: Not on file    Relationship status: Not on file  Other Topics Concern  . Not on file  Social History Narrative  . Not on file   Additional Social History:                         Sleep: Good  Appetite:  Good  Current Medications: Current Facility-Administered Medications  Medication Dose Route Frequency Provider Last Rate Last Dose  . acetaminophen (TYLENOL) tablet 650 mg  650 mg Oral Q6H PRN Clapacs, Madie Reno, MD   650 mg at 08/20/17 1844  . albuterol (PROVENTIL HFA;VENTOLIN HFA) 108 (90 Base) MCG/ACT inhaler 2 puff  2 puff Inhalation Q6H PRN Clapacs, Madie Reno, MD   2 puff at 08/20/17 1304  . alum & mag hydroxide-simeth (MAALOX/MYLANTA) 200-200-20 MG/5ML suspension 30 mL  30 mL Oral Q4H PRN Clapacs, John T, MD      . buPROPion (WELLBUTRIN XL) 24 hr tablet 300 mg  300 mg Oral Daily McNew, Tyson Babinski, MD   300 mg at 08/22/17 0810  . dexmethylphenidate (FOCALIN) tablet 5 mg  5 mg Oral BID Marylin Crosby, MD   5 mg at 08/22/17 0809  . famotidine (PEPCID) tablet 20 mg  20 mg Oral Daily PRN Marylin Crosby, MD   20 mg at 08/21/17 1414  . fluticasone (FLONASE) 50 MCG/ACT nasal spray 2 spray  2 spray Each Nare Daily Clapacs, Madie Reno, MD   2 spray at 08/22/17 1007  . gabapentin (NEURONTIN) capsule 300 mg  300 mg Oral TID Clapacs, Madie Reno, MD   300 mg at 08/22/17 0809  . hydrochlorothiazide (HYDRODIURIL) tablet 25 mg  25 mg Oral Daily Clapacs, Madie Reno, MD   25 mg at 08/22/17 0809  . hydrOXYzine (ATARAX/VISTARIL) tablet 50 mg  50 mg Oral TID  PRN McNew, Tyson Babinski, MD      . levothyroxine (SYNTHROID, LEVOTHROID) tablet 50 mcg  50 mcg Oral QAC breakfast Marylin Crosby, MD   50 mcg at 08/22/17 [phone]  . loratadine (CLARITIN) tablet 10 mg  10 mg Oral Daily Clapacs, Madie Reno, MD   10 mg at 08/22/17 0809  . magnesium hydroxide (MILK OF MAGNESIA) suspension 30 mL  30 mL Oral Daily PRN Clapacs, John T, MD      . menthol-cetylpyridinium (CEPACOL) lozenge 3 mg  1 lozenge Oral PRN McNew, Tyson Babinski, MD      . mometasone-formoterol (DULERA) 200-5 MCG/ACT inhaler 2 puff  2 puff Inhalation BID Clapacs, Madie Reno, MD   2 puff at 08/21/17 2131  . ondansetron (ZOFRAN) tablet 4 mg  4 mg Oral TID PC McNew, Tyson Babinski, MD   4 mg at 08/22/17 8676  . oxyCODONE (Oxy IR/ROXICODONE) immediate release tablet 5 mg  5 mg Oral BID PRN Marylin Crosby, MD   5 mg at 08/22/17 1007  . pantoprazole (PROTONIX) EC tablet 20 mg  20 mg Oral BID PRN Marylin Crosby, MD   20 mg at 08/20/17 1844  . risperiDONE (RISPERDAL) tablet 2 mg  2 mg Oral QHS Clapacs, Madie Reno, MD   2 mg at 08/21/17 2133  . simethicone (MYLICON) chewable tablet 80 mg  80 mg Oral BID PRN Marylin Crosby, MD   80 mg at 08/21/17 1559  . sucralfate (CARAFATE) tablet 1 g  1 g Oral TID WC & HS Clapacs, Madie Reno, MD   1 g at 08/21/17 2132  . tiotropium (SPIRIVA) inhalation capsule 18 mcg  18 mcg Inhalation Daily Clapacs, Madie Reno, MD   18 mcg at 08/21/17 1414  . traMADol (ULTRAM) tablet 50 mg  50 mg Oral Q6H PRN Clapacs, Madie Reno, MD   50 mg at 08/21/17 0552  . traZODone (DESYREL) tablet 100 mg  100 mg Oral QHS PRN Clapacs, Madie Reno, MD   100 mg at 08/20/17 2138  . venlafaxine [facility]) tablet 75 mg  75 mg Oral BH-q7a McNew, Tyson Babinski, MD   75 mg at 08/22/17 7209   And  . venlafaxine [facility]) tablet 37.5 mg  37.5 mg Oral BID Marylin Crosby, MD   37.5 mg at 08/21/17 2141    Lab Results:  Results for orders placed or performed during the hospital encounter of 08/19/17 (from the past 48 hour(s))  Basic metabolic panel     Status: None   Collection Time: 08/21/17  7:12 AM  Result Value Ref Range   Sodium 144 135 - 145 mmol/L   Potassium 3.5 3.5 - 5.1 mmol/L   Chloride 105 98 - 111 mmol/L   CO2 31 22 - 32 mmol/L   Glucose, Bld 84 70 - 99 mg/dL   BUN 14 6 - 20 mg/dL   Creatinine, Ser 0.68 0.44 - 1.00 mg/dL   Calcium 9.0 8.9 - 10.3 mg/dL   GFR calc non Af Amer >60 >60 mL/min   GFR calc Af Amer >60 >60 mL/min    Comment: (NOTE) The eGFR has been calculated using the CKD EPI equation. This calculation has not been validated in all clinical situations. eGFR's persistently <60 mL/min signify possible Chronic Kidney Disease.    Anion gap 8 5 - 15    Comment: Performed at [facility], [address] [place], [place] [address]    Blood Alcohol level:  Lab Results  Component Value  Date   ETH <10 08/19/2017   ETH <5 09/09/147    Metabolic Disorder Labs: Lab Results  Component Value Date   HGBA1C 4.8 08/19/2017   MPG 91.06 08/19/2017   MPG 114.02 08/26/2016   No results found for: PROLACTIN Lab Results  Component Value Date   CHOL 157 08/19/2017   TRIG 341 (H) 08/19/2017   HDL 40 (L) 08/19/2017   CHOLHDL 3.9 08/19/2017   VLDL 68 (H) 08/19/2017   LDLCALC 49 08/19/2017   LDLCALC 125 (H) 08/26/2016    Physical Findings: AIMS:  , ,  ,  ,    CIWA:    COWS:     Musculoskeletal: Strength & Muscle Tone: within normal limits Gait & Station: normal Patient leans: N/A  Psychiatric Specialty Exam: Physical Exam  Nursing note and vitals reviewed.   Review of Systems  Musculoskeletal: Positive for joint pain.  All other systems reviewed and are negative.   Blood pressure (!) 100/53, pulse (!) 115, temperature 98.5 F (36.9 C), temperature source Oral, resp. rate 18, height 5' 0.5" (1.537  m), weight 85.3 kg, SpO2 94 %.Body mass index is 36.11 kg/m.  General Appearance: Casual  Eye Contact:  Good  Speech:  Clear and Coherent  Volume:  Normal  Mood:  depression  Affect:  Congruent  Thought Process:  Coherent and Goal Directed  Orientation:  Full (Time, Place, and Person)  Thought Content:  Logical  Suicidal Thoughts:  " not today"  Homicidal Thoughts:  denies  Memory:  Immediate;   Poor  Judgement:  Fair  Insight:  Fair  Psychomotor Activity:  Normal  Concentration:  Concentration: Fair  Recall:  [employer] of Knowledge:  Fair  Language:  Fair  Akathisia:  No      Assets:  Resilience  ADL's:  Intact  Cognition:  WNL  Sleep:  Number of Hours: 7     Treatment Plan Summary: 42 yo female admitted due to SI related to medical issues. Mood improving slowly.   Plan:  Mood disorder -Continue Risperdal 2 mg qhs -Continue lower dose of Effexor IR of 75 mg qam and 37.5 mg BID -Continue Wellbutrin XL 300 mg daily -Continue Gabapentin to 300 mg TID   ADHD -Continue Focalin 5 mg BID. Confirmed via prescribing database that last fill was on 8/12  Chronic pain -Continue tramadol -Oxycodone 5 mg BID prn. Confirmed via [place] that she had this filled on 7/30  UTI -Continue Keflex 500 mg BID -Continue Pyridine 100 mg TID  Dispo- per primary t team- -She will return home on discharge     Lenward Chancellor, MD 08/22/2017, 12:19 PMPatient ID: Kirsten Richardson, female   DOB: 1975/07/21, 42 y.o.   MRN: [id number]

## 2017-08-22 NOTE — Plan of Care (Signed)
Patient contract for safety, denies SI/HI, support and encouragement provided to patient, compliant with medicines , resting comfortably without  any issues, 15 minute rounding maintained , sleeping long hours without interruptions, no distress   Problem: Education: Goal: Knowledge of [place] [facility] Education information/materials will improve Outcome: Progressing   Problem: Activity: Goal: Sleeping patterns will improve Outcome: Progressing   Problem: Health Behavior/Discharge Planning: Goal: Compliance with treatment plan for underlying cause of condition will improve Outcome: Progressing   Problem: Safety: Goal: Periods of time without injury will increase Outcome: Progressing   Problem: Education: Goal: Ability to make informed decisions regarding treatment will improve Outcome: Progressing   Problem: Medication: Goal: Compliance with prescribed medication regimen will improve Outcome: Progressing   Problem: Self-Concept: Goal: Will verbalize positive feelings about self Outcome: Progressing   Problem: Activity: Goal: Will identify at least one activity in which they can participate Outcome: Progressing   Problem: Self-Concept: Goal: Will verbalize positive feelings about self Outcome: Progressing   Problem: Coping: Goal: Coping ability will improve Outcome: Progressing Goal: Will verbalize feelings Outcome: Progressing   Problem: Health Behavior/Discharge Planning: Goal: Compliance with therapeutic regimen will improve Outcome: Progressing   Problem: Education: Goal: Knowledge of General Education information will improve Description Including pain rating scale, medication(s)/side effects and non-pharmacologic comfort measures Outcome: Progressing

## 2017-08-22 NOTE — [facility] Group Notes (Signed)
[facility] Group Notes:  (Nursing/MHT/Case Management/Adjunct)  Date:  08/22/2017  Time:  11:48 PM  Type of Therapy:  Group Therapy  Participation Level:  Did Not Attend   Jinger Neighbors 08/22/2017, 11:48 PM

## 2017-08-22 NOTE — [facility] Group Notes (Signed)
LCSW Group Therapy Note  08/22/2017 1:15pm  Type of Therapy and Topic:  Group Therapy:  Healthy Self Image and Positive Change  Participation Level:  Active   Description of Group:  In this group, patients will compare and contrast their current "I am...." statements to the visions they identify as desirable for their lives.  Patients discuss fears and how they can make positive changes in their cognitions that will positively impact their behaviors.  Facilitator played a motivational 3-minute speech and patients were left with the task of thinking about what "I am...." statements they can start using in their lives immediately.  Therapeutic Goals: 1. Patient will state their current self-perception as expressed in an "I Am" statement 2. Patient will contrast this with their desired vision for their live 3. Patient will identify 3 fears that negatively impact their behavior 4. Patient will discuss cognitive distortions that stem from their fears 5. Patient will verbalize statements that challenge their cognitive distortions  Summary of Patient Progress:  The patient reported that she feels "good." The patient stated, "I am thoughtful" Patient discussed  her fears and how she can make positive changes in their cognitions that will positively impact  her behaviors. Patient was able to discuss and process cognitive distortions that stem from  her fears. Patient actively and appropriately engaged in the group. Patient was able to provide support and validation to other group members. Patient practiced active listening when interacting with the facilitator and other group members.    Therapeutic Modalities Cognitive Behavioral Therapy Motivational Interviewing  Salik Grewell  CUEBAS-COLON, LCSW 08/22/2017 12:07 PM

## 2017-08-22 NOTE — Plan of Care (Signed)
Patient is alert and oriented. Denies SI, HI but positive for hearing voices. Patient is pleasant; guarded and does not attend group. Comes out of room for meals and patient does shower and change scrubs. Patient is compliant with medications, complains of left ankle pain which is being managed. No self injurious behaviors noted. Problem: Education: Goal: Knowledge of [facility] Education information/materials will improve Outcome: Progressing   Problem: Activity: Goal: Sleeping patterns will improve Outcome: Progressing   Problem: Health Behavior/Discharge Planning: Goal: Compliance with treatment plan for underlying cause of condition will improve Outcome: Progressing

## 2017-08-23 MED ORDER — ONDANSETRON 4 MG PO TBDP
4.0000 mg | ORAL_TABLET | Freq: Three times a day (TID) | ORAL | Status: DC | PRN
  Administered 2017-08-24 – 2017-08-25 (×2): 4 mg via ORAL
  Filled 2017-08-23 (×2): qty 1

## 2017-08-23 NOTE — [facility] Group Notes (Signed)
LCSW Group Therapy Note 08/23/2017 1:15pm  Type of Therapy and Topic: Group Therapy: Feelings Around Returning Home & Establishing a Supportive Framework and Supporting Oneself When Supports Not Available  Participation Level: Active  Description of Group:  Patients first processed thoughts and feelings about upcoming discharge. These included fears of upcoming changes, lack of change, new living environments, judgements and expectations from others and overall stigma of mental health issues. The group then discussed the definition of a supportive framework, what that looks and feels like, and how do to discern it from an unhealthy non-supportive network. The group identified different types of supports as well as what to do when your family/friends are less than helpful or unavailable  Therapeutic Goals  1. Patient will identify one healthy supportive network that they can use at discharge. 2. Patient will identify one factor of a supportive framework and how to tell it from an unhealthy network. 3. Patient able to identify one coping skill to use when they do not have positive supports from others. 4. Patient will demonstrate ability to communicate their needs through discussion and/or role plays.  Summary of Patient Progress:  Patient reported she feels "a little down today." Pt engaged during group session. As patients processed their anxiety about discharge and described healthy supports patient shared she does not know if she is ready to be discharge. Patients identified at least one self-care tool they were willing to use after discharge.   Therapeutic Modalities Cognitive Behavioral Therapy Motivational Interviewing   Kabe Mckoy  CUEBAS-COLON, LCSW 08/23/2017 12:42 PM

## 2017-08-23 NOTE — Progress Notes (Signed)
[facility] MD Progress Note  08/23/2017 11:51 AM Kirsten Richardson  MRN:  [id number] Subjective:  I'm doing fine" Pt reports feeling better , less depressed,  less isolative in her room. , med compliant, tolerating well. Reported burning urination, had completed abx, will rpt UA.   Principal Problem: Bipolar 2 disorder, major depressive episode (HCC) Diagnosis:   Patient Active Problem List   Diagnosis Date Noted  . Peptic ulcer [K27.9] 08/19/2017  . COPD (chronic obstructive pulmonary disease) (HCC) [J44.9] 08/26/2016  . Constipation [K59.00] 08/26/2016  . Bipolar 2 disorder, major depressive episode (HCC) [F31.81] 08/25/2016  . Essential hypertension [I10] 08/31/2015  . DDD (degenerative disc disease), lumbar [M51.36] 08/02/2015  . DDD (degenerative disc disease), cervical [M50.30] 08/02/2015  . 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 Hip pain, chronic [M25.559, G89.29] 09/27/2014  . Borderline personality disorder (HCC) [F60.3] 09/27/2014  . SUI (stress urinary incontinence, female) [N39.3] 01/02/2014   Total Time spent with patient: 25 min  Past Psychiatric History: See h&P  Past Medical History:  Past Medical History:  Diagnosis Date  . Anemia   . Arthritis   . Back pain   . Bipolar disorder (HCC)   . Borderline personality disorder (HCC)   . Carpal tunnel syndrome   . Degenerative disc disease, lumbar   . Depression   . Eczema   . Emphysema of lung (HCC)   . Emphysema of lung (HCC)   . GERD (gastroesophageal reflux disease)   . History of hemorrhoids   . Hypertension   . Hypothyroidism   . Neck pain   . Plantar fasciitis   . Plantar fasciitis   . Scoliosis   . SUI (stress urinary incontinence, female)   . Thyroid disease   . Vitamin B 12 deficiency     Past Surgical History:  Procedure Laterality Date  . CARPAL TUNNEL RELEASE Right 2018   then left done a few weeks later  . COLONOSCOPY WITH PROPOFOL N/A 04/02/2017   Procedure:  COLONOSCOPY WITH PROPOFOL;  Surgeon: Christena Deem, MD;  Location: [facility] ENDOSCOPY;  Service: Endoscopy;  Laterality: N/A;  . ESOPHAGOGASTRODUODENOSCOPY (EGD) WITH PROPOFOL N/A 07/21/2017   Procedure: ESOPHAGOGASTRODUODENOSCOPY (EGD) WITH PROPOFOL;  Surgeon: Christena Deem, MD;  Location: [facility] ENDOSCOPY;  Service: Endoscopy;  Laterality: N/A;  . FOOT SURGERY Right    plantar fasciatis  . HIP FRACTURE SURGERY Bilateral   . TUBAL LIGATION    . WISDOM TOOTH EXTRACTION  09/2005   Family History:  Family History  Problem Relation Age of Onset  . Arthritis Mother   . COPD Mother   . Cancer Mother   . Depression Mother   . Early death Mother   . Vision loss Mother   . Mental illness Mother   . Alcohol abuse Father   . Arthritis Father   . Cancer Father   . Diabetes Father   . Drug abuse Father   . Early death Father   . Vision loss Father   . Heart disease Father   . Hypertension Father   . Mental illness Father   . Stroke Father   . Lung cancer Sister   . Pancreatitis Brother   . Hypertension Brother   . Diabetes Brother   . Diverticulitis Brother   . Cirrhosis Brother   . Hypertension Paternal Grandmother   . Heart disease Paternal Grandmother    Family Psychiatric  History: See h&P Social History:  Social History   Substance and Sexual  Activity  Alcohol Use No  . Alcohol/week: 0.0 standard drinks     Social History   Substance and Sexual Activity  Drug Use No    Social History   Socioeconomic History  . Marital status: Single    Spouse name: Not on file  . Number of children: Not on file  . Years of education: Not on file  . Highest education level: Not on file  Occupational History  . Not on file  Social Needs  . Financial resource strain: Not on file  . Food insecurity:    Worry: Not on file    Inability: Not on file  . Transportation needs:    Medical: Not on file    Non-medical: Not on file  Tobacco Use  . Smoking status: Former Smoker     Packs/day: 0.20    Years: 5.00    Pack years: 1.00    Types: Cigarettes    Last attempt to quit: 07/25/2015    Years since quitting: 2.0  . Smokeless tobacco: Never Used  Substance and Sexual Activity  . Alcohol use: No    Alcohol/week: 0.0 standard drinks  . Drug use: No  . Sexual activity: Yes    Birth control/protection: Pill  Lifestyle  . Physical activity:    Days per week: Not on file    Minutes per session: Not on file  . Stress: Not on file  Relationships  . Social connections:    Talks on phone: Not on file    Gets together: Not on file    Attends religious service: Not on file    Active member of club or organization: Not on file    Attends meetings of clubs or organizations: Not on file    Relationship status: Not on file  Other Topics Concern  . Not on file  Social History Narrative  . Not on file   Additional Social History:                         Sleep: Good  Appetite:  Good  Current Medications: Current Facility-Administered Medications  Medication Dose Route Frequency Provider Last Rate Last Dose  . acetaminophen (TYLENOL) tablet 650 mg  650 mg Oral Q6H PRN Clapacs, Jackquline Denmark, MD   650 mg at 08/20/17 1844  . albuterol (PROVENTIL HFA;VENTOLIN HFA) 108 (90 Base) MCG/ACT inhaler 2 puff  2 puff Inhalation Q6H PRN Clapacs, Jackquline Denmark, MD   2 puff at 08/20/17 1304  . alum & mag hydroxide-simeth (MAALOX/MYLANTA) 200-200-20 MG/5ML suspension 30 mL  30 mL Oral Q4H PRN Clapacs, John T, MD      . buPROPion (WELLBUTRIN XL) 24 hr tablet 300 mg  300 mg Oral Daily McNew, Ileene Hutchinson, MD   300 mg at 08/23/17 2409  . dexmethylphenidate (FOCALIN) tablet 5 mg  5 mg Oral BID Haskell Riling, MD   5 mg at 08/23/17 7353  . famotidine (PEPCID) tablet 20 mg  20 mg Oral Daily PRN Haskell Riling, MD   20 mg at 08/21/17 1414  . fluticasone (FLONASE) 50 MCG/ACT nasal spray 2 spray  2 spray Each Nare Daily Clapacs, Jackquline Denmark, MD   2 spray at 08/23/17 0816  . gabapentin (NEURONTIN)  capsule 300 mg  300 mg Oral TID Clapacs, John T, MD   300 mg at 08/23/17 1143  . hydrochlorothiazide (HYDRODIURIL) tablet 25 mg  25 mg Oral Daily Clapacs, Jackquline Denmark, MD   25 mg  at 08/23/17 0815  . hydrOXYzine (ATARAX/VISTARIL) tablet 50 mg  50 mg Oral TID PRN McNew, Ileene Hutchinson, MD      . levothyroxine (SYNTHROID, LEVOTHROID) tablet 50 mcg  50 mcg Oral QAC breakfast McNew, Ileene Hutchinson, MD   50 mcg at 08/23/17 0556  . loratadine (CLARITIN) tablet 10 mg  10 mg Oral Daily Clapacs, Jackquline Denmark, MD   10 mg at 08/23/17 1700  . magnesium hydroxide (MILK OF MAGNESIA) suspension 30 mL  30 mL Oral Daily PRN Clapacs, John T, MD      . menthol-cetylpyridinium (CEPACOL) lozenge 3 mg  1 lozenge Oral PRN McNew, Ileene Hutchinson, MD      . mometasone-formoterol (DULERA) 200-5 MCG/ACT inhaler 2 puff  2 puff Inhalation BID Clapacs, Jackquline Denmark, MD   2 puff at 08/22/17 2021  . ondansetron (ZOFRAN-ODT) disintegrating tablet 4 mg  4 mg Oral TID WC PRN Beverly Sessions, MD      . oxyCODONE (Oxy IR/ROXICODONE) immediate release tablet 5 mg  5 mg Oral BID PRN Haskell Riling, MD   5 mg at 08/23/17 0556  . pantoprazole (PROTONIX) EC tablet 20 mg  20 mg Oral BID PRN Haskell Riling, MD   20 mg at 08/20/17 1844  . risperiDONE (RISPERDAL) tablet 2 mg  2 mg Oral QHS Clapacs, Jackquline Denmark, MD   2 mg at 08/22/17 2022  . simethicone (MYLICON) chewable tablet 80 mg  80 mg Oral BID PRN Haskell Riling, MD   80 mg at 08/21/17 1559  . sucralfate (CARAFATE) tablet 1 g  1 g Oral TID WC & HS Clapacs, Jackquline Denmark, MD   1 g at 08/23/17 1143  . tiotropium (SPIRIVA) inhalation capsule 18 mcg  18 mcg Inhalation Daily Clapacs, Jackquline Denmark, MD   18 mcg at 08/22/17 1223  . traMADol (ULTRAM) tablet 50 mg  50 mg Oral Q6H PRN Clapacs, Jackquline Denmark, MD   50 mg at 08/21/17 0552  . traZODone (DESYREL) tablet 100 mg  100 mg Oral QHS PRN Clapacs, Jackquline Denmark, MD   100 mg at 08/22/17 2025  . venlafaxine [facility]) tablet 75 mg  75 mg Oral BH-q7a McNew, Ileene Hutchinson, MD   75 mg at 08/23/17 0557   And  . venlafaxine  [facility]) tablet 37.5 mg  37.5 mg Oral BID McNew, Ileene Hutchinson, MD   37.5 mg at 08/23/17 1150    Lab Results:  No results found for this or any previous visit (from the past 48 hour(s)).  Blood Alcohol level:  Lab Results  Component Value Date   ETH <10 08/19/2017   ETH <5 08/25/2016    Metabolic Disorder Labs: Lab Results  Component Value Date   HGBA1C 4.8 08/19/2017   MPG 91.06 08/19/2017   MPG 114.02 08/26/2016   No results found for: PROLACTIN Lab Results  Component Value Date   CHOL 157 08/19/2017   TRIG 341 (H) 08/19/2017   HDL 40 (L) 08/19/2017   CHOLHDL 3.9 08/19/2017   VLDL 68 (H) 08/19/2017   LDLCALC 49 08/19/2017   LDLCALC 125 (H) 08/26/2016    Physical Findings: AIMS:  , ,  ,  ,    CIWA:    COWS:     Musculoskeletal: Strength & Muscle Tone: within normal limits Gait & Station: normal Patient leans: N/A  Psychiatric Specialty Exam: Physical Exam  Nursing note and vitals reviewed.   Review of Systems  Musculoskeletal: Positive for joint pain.  All other systems reviewed and  are negative.   Blood pressure 113/79, pulse 90, temperature 98.8 F (37.1 C), temperature source Oral, resp. rate 16, height 5' 0.5" (1.537 m), weight 85.3 kg, SpO2 93 %.Body mass index is 36.11 kg/m.  General Appearance: Casual  Eye Contact:  Good  Speech:  Clear and Coherent  Volume:  Normal  Mood:  depression  Affect:  Congruent  Thought Process:  Coherent and Goal Directed  Orientation:  Full (Time, Place, and Person)  Thought Content:  Logical  Suicidal Thoughts:  " not today"  Homicidal Thoughts:  denies  Memory:  Immediate;   Poor  Judgement:  Fair  Insight:  Fair  Psychomotor Activity:  Normal  Concentration:  Concentration: Fair  Recall:  [employer] of Knowledge:  Fair  Language:  Fair  Akathisia:  No      Assets:  Resilience  ADL's:  Intact  Cognition:  WNL  Sleep:  Number of Hours: 6.45     Treatment Plan Summary: 42 yo female admitted due to SI  related to medical issues. Mood improving .  Rpt UA.   Plan:  Mood disorder -Continue Risperdal 2 mg qhs -Continue lower dose of Effexor IR of 75 mg qam and 37.5 mg BID -Continue Wellbutrin XL 300 mg daily -Continue Gabapentin to 300 mg TID   ADHD -Continue Focalin 5 mg BID. Confirmed via prescribing database that last fill was on 8/12  Chronic pain -Continue tramadol -Oxycodone 5 mg BID prn. Confirmed via [facility] database that she had this filled on 7/30  UTI -Continue Keflex 500 mg BID -Continue Pyridine 100 mg TID  Dispo- per primary t team- -She will return home on discharge     Beverly Sessions, MD 08/23/2017, 11:51 AMPatient ID: Kirsten Richardson, female   DOB: 05/25/1975, 42 y.o.   MRN: [id number] Patient ID: Kirsten Richardson, female   DOB: 1975-03-26, 42 y.o.   MRN: [id number]

## 2017-08-23 NOTE — Plan of Care (Signed)
Patient is stable coherent and isolative to her self in the room , compliant with her medications , contract for safety , voice no complain pertaining to her health , responding appropriately in the unit , denies SI/HI and no signs of AVH , 15 minute rounding is in progress no distress.   Problem: Education: Goal: Knowledge of [place] [facility] Education information/materials will improve Outcome: Progressing   Problem: Activity: Goal: Sleeping patterns will improve Outcome: Progressing   Problem: Health Behavior/Discharge Planning: Goal: Compliance with treatment plan for underlying cause of condition will improve Outcome: Progressing   Problem: Safety: Goal: Periods of time without injury will increase Outcome: Progressing   Problem: Education: Goal: Ability to make informed decisions regarding treatment will improve Outcome: Progressing   Problem: Medication: Goal: Compliance with prescribed medication regimen will improve Outcome: Progressing   Problem: Self-Concept: Goal: Will verbalize positive feelings about self Outcome: Progressing   Problem: Activity: Goal: Will identify at least one activity in which they can participate Outcome: Progressing   Problem: Self-Concept: Goal: Will verbalize positive feelings about self Outcome: Progressing   Problem: Coping: Goal: Coping ability will improve Outcome: Progressing Goal: Will verbalize feelings Outcome: Progressing   Problem: Health Behavior/Discharge Planning: Goal: Compliance with therapeutic regimen will improve Outcome: Progressing   Problem: Education: Goal: Knowledge of General Education information will improve Description Including pain rating scale, medication(s)/side effects and non-pharmacologic comfort measures Outcome: Progressing

## 2017-08-23 NOTE — Plan of Care (Signed)
Patient is calm and comfortable in her room, minimal interactions with peers , contract for  safety of self and others , compliant with her meds , aware of her coping skills and able to verbalize positive attribute of her self , denies any SI/HI and no signs of AVH at this time , 15 minute  rounding is maintained.   Problem: Education: Goal: Knowledge of [place] [facility] Education information/materials will improve Outcome: Progressing   Problem: Activity: Goal: Sleeping patterns will improve Outcome: Progressing   Problem: Health Behavior/Discharge Planning: Goal: Compliance with treatment plan for underlying cause of condition will improve Outcome: Progressing   Problem: Safety: Goal: Periods of time without injury will increase Outcome: Progressing   Problem: Education: Goal: Ability to make informed decisions regarding treatment will improve Outcome: Progressing   Problem: Medication: Goal: Compliance with prescribed medication regimen will improve Outcome: Progressing   Problem: Self-Concept: Goal: Will verbalize positive feelings about self Outcome: Progressing   Problem: Activity: Goal: Will identify at least one activity in which they can participate Outcome: Progressing   Problem: Self-Concept: Goal: Will verbalize positive feelings about self Outcome: Progressing   Problem: Coping: Goal: Coping ability will improve Outcome: Progressing Goal: Will verbalize feelings Outcome: Progressing   Problem: Health Behavior/Discharge Planning: Goal: Compliance with therapeutic regimen will improve Outcome: Progressing   Problem: Education: Goal: Knowledge of General Education information will improve Description Including pain rating scale, medication(s)/side effects and non-pharmacologic comfort measures Outcome: Progressing

## 2017-08-23 NOTE — Plan of Care (Signed)
Patient is alert and oriented X 4. Patient denies SI, HI and AVH. Patient seems more euphemistic today and out in the day room with fellow peer conversing. Patient is compliant with medications and pleasant. Rates pain 0/10 this morning. Nurse will continue to monitor. Problem: Education: Goal: Knowledge of [place] [facility] Education information/materials will improve Outcome: Progressing   Problem: Activity: Goal: Sleeping patterns will improve Outcome: Progressing   Problem: Health Behavior/Discharge Planning: Goal: Compliance with treatment plan for underlying cause of condition will improve Outcome: Progressing

## 2017-08-24 LAB — BASIC METABOLIC PANEL WITH GFR
Anion gap: 9 (ref 5–15)
BUN: 15 mg/dL (ref 6–20)
CO2: 31 mmol/L (ref 22–32)
Calcium: 9.6 mg/dL (ref 8.9–10.3)
Chloride: 102 mmol/L (ref 98–111)
Creatinine, Ser: 0.96 mg/dL (ref 0.44–1.00)
GFR calc Af Amer: >60 mL/min
GFR calc non Af Amer: >60 mL/min
Glucose, Bld: 136 mg/dL — ABNORMAL HIGH (ref 70–99)
Potassium: 4.9 mmol/L (ref 3.5–5.1)
Sodium: 142 mmol/L (ref 135–145)

## 2017-08-24 MED ORDER — POTASSIUM CHLORIDE CRYS ER 20 MEQ PO TBCR
20.0000 meq | EXTENDED_RELEASE_TABLET | Freq: Two times a day (BID) | ORAL | Status: DC
  Administered 2017-08-24 – 2017-08-25 (×2): 20 meq via ORAL
  Filled 2017-08-24 (×2): qty 1

## 2017-08-24 MED ORDER — NICOTINE 21 MG/24HR TD PT24
21.0000 mg | MEDICATED_PATCH | Freq: Every day | TRANSDERMAL | Status: DC
  Administered 2017-08-24 – 2017-08-26 (×3): 21 mg via TRANSDERMAL
  Filled 2017-08-24 (×2): qty 1

## 2017-08-24 MED ORDER — DICLOFENAC SODIUM 1 % TD GEL
2.0000 g | Freq: Three times a day (TID) | TRANSDERMAL | Status: DC | PRN
  Filled 2017-08-24: qty 100

## 2017-08-24 NOTE — [facility] Group Notes (Signed)
[facility] Group Notes:  (Nursing/MHT/Case Management/Adjunct)  Date:  08/24/2017  Time:  9:44 PM  Type of Therapy:  Group Therapy  Participation Level:  Active  Participation Quality:  Appropriate  Affect:  Appropriate  Cognitive:  Appropriate  Insight:  Good  Engagement in Group:  Engaged  Modes of Intervention:  Support  Summary of Progress/Problems:  Mayra Neer 08/24/2017, 9:44 PM

## 2017-08-24 NOTE — Progress Notes (Signed)
Recreation Therapy Notes  Date: 08/24/2017  Time: 9:30 am   Location: Craft Room   Behavioral response: N/A   Intervention Topic: Stress  Discussion/Intervention: Patient did not attend group.   Clinical Observations/Feedback:  Patient did not attend group.   Haelee Bolen LRT/CTRS        Mumin Denomme 08/24/2017 11:42 AM

## 2017-08-24 NOTE — Progress Notes (Signed)
[facility] MD Progress Note  08/24/2017 2:07 PM Kirsten Richardson  MRN:  [id number] Subjective:  Pt states that she had a rough day yesterday. She was having some issues with nursing staff and was upset about that. She was feeling more depressed and stayed in her room most of the day. Today she is feeling slightly better. She feels tired overall. Has passive SI but is "trying to ignore them." She is looking forward to her peer support visiting today. Pt wants to start going to NA meetings to help strengthen her support network and is willing to whatever it takes to get well. She also wants to look into DBT groups. She is sleeping okay. She is eating well. She is spending a lot of time journaling which is helping her a lot.   Principal Problem: Bipolar 2 disorder, major depressive episode (HCC) Diagnosis:   Patient Active Problem List   Diagnosis Date Noted  . Bipolar 2 disorder, major depressive episode (HCC) [F31.81] 08/25/2016    Priority: High  . Borderline personality disorder (HCC) [F60.3] 09/27/2014    Priority: High  . Peptic ulcer [K27.9] 08/19/2017  . COPD (chronic obstructive pulmonary disease) (HCC) [J44.9] 08/26/2016  . Constipation [K59.00] 08/26/2016  . Essential hypertension [I10] 08/31/2015  . DDD (degenerative disc disease), lumbar [M51.36] 08/02/2015  . DDD (degenerative disc disease), cervical [M50.30] 08/02/2015  . 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 Hip pain, chronic [M25.559, G89.29] 09/27/2014  . SUI (stress urinary incontinence, female) [N39.3] 01/02/2014   Total Time spent with patient: 20 minutes  Past Psychiatric History: See H&P  Past Medical History:  Past Medical History:  Diagnosis Date  . Anemia   . Arthritis   . Back pain   . Bipolar disorder (HCC)   . Borderline personality disorder (HCC)   . Carpal tunnel syndrome   . Degenerative disc disease, lumbar   . Depression   . Eczema   . Emphysema of lung (HCC)   .  Emphysema of lung (HCC)   . GERD (gastroesophageal reflux disease)   . History of hemorrhoids   . Hypertension   . Hypothyroidism   . Neck pain   . Plantar fasciitis   . Plantar fasciitis   . Scoliosis   . SUI (stress urinary incontinence, female)   . Thyroid disease   . Vitamin B 12 deficiency     Past Surgical History:  Procedure Laterality Date  . CARPAL TUNNEL RELEASE Right 2018   then left done a few weeks later  . COLONOSCOPY WITH PROPOFOL N/A 04/02/2017   Procedure: COLONOSCOPY WITH PROPOFOL;  Surgeon: Christena Deem, MD;  Location: [facility] ENDOSCOPY;  Service: Endoscopy;  Laterality: N/A;  . ESOPHAGOGASTRODUODENOSCOPY (EGD) WITH PROPOFOL N/A 07/21/2017   Procedure: ESOPHAGOGASTRODUODENOSCOPY (EGD) WITH PROPOFOL;  Surgeon: Christena Deem, MD;  Location: [facility] ENDOSCOPY;  Service: Endoscopy;  Laterality: N/A;  . FOOT SURGERY Right    plantar fasciatis  . HIP FRACTURE SURGERY Bilateral   . TUBAL LIGATION    . WISDOM TOOTH EXTRACTION  09/2005   Family History:  Family History  Problem Relation Age of Onset  . Arthritis Mother   . COPD Mother   . Cancer Mother   . Depression Mother   . Early death Mother   . Vision loss Mother   . Mental illness Mother   . Alcohol abuse Father   . Arthritis Father   . Cancer Father   . Diabetes Father   . Drug  abuse Father   . Early death Father   . Vision loss Father   . Heart disease Father   . Hypertension Father   . Mental illness Father   . Stroke Father   . Lung cancer Sister   . Pancreatitis Brother   . Hypertension Brother   . Diabetes Brother   . Diverticulitis Brother   . Cirrhosis Brother   . Hypertension Paternal Grandmother   . Heart disease Paternal Grandmother    Family Psychiatric  History: See H&P Social History:  Social History   Substance and Sexual Activity  Alcohol Use No  . Alcohol/week: 0.0 standard drinks     Social History   Substance and Sexual Activity  Drug Use No    Social History    Socioeconomic History  . Marital status: Single    Spouse name: Not on file  . Number of children: Not on file  . Years of education: Not on file  . Highest education level: Not on file  Occupational History  . Not on file  Social Needs  . Financial resource strain: Not on file  . Food insecurity:    Worry: Not on file    Inability: Not on file  . Transportation needs:    Medical: Not on file    Non-medical: Not on file  Tobacco Use  . Smoking status: Former Smoker    Packs/day: 0.20    Years: 5.00    Pack years: 1.00    Types: Cigarettes    Last attempt to quit: 07/25/2015    Years since quitting: 2.0  . Smokeless tobacco: Never Used  Substance and Sexual Activity  . Alcohol use: No    Alcohol/week: 0.0 standard drinks  . Drug use: No  . Sexual activity: Yes    Birth control/protection: Pill  Lifestyle  . Physical activity:    Days per week: Not on file    Minutes per session: Not on file  . Stress: Not on file  Relationships  . Social connections:    Talks on phone: Not on file    Gets together: Not on file    Attends religious service: Not on file    Active member of club or organization: Not on file    Attends meetings of clubs or organizations: Not on file    Relationship status: Not on file  Other Topics Concern  . Not on file  Social History Narrative  . Not on file   Additional Social History:                         Sleep: Good  Appetite:  Good  Current Medications: Current Facility-Administered Medications  Medication Dose Route Frequency Provider Last Rate Last Dose  . acetaminophen (TYLENOL) tablet 650 mg  650 mg Oral Q6H PRN Clapacs, Jackquline Denmark, MD   650 mg at 08/20/17 1844  . albuterol (PROVENTIL HFA;VENTOLIN HFA) 108 (90 Base) MCG/ACT inhaler 2 puff  2 puff Inhalation Q6H PRN Clapacs, Jackquline Denmark, MD   2 puff at 08/20/17 1304  . alum & mag hydroxide-simeth (MAALOX/MYLANTA) 200-200-20 MG/5ML suspension 30 mL  30 mL Oral Q4H PRN Clapacs,  John T, MD      . buPROPion (WELLBUTRIN XL) 24 hr tablet 300 mg  300 mg Oral Daily Sharma Lawrance, Ileene Hutchinson, MD   300 mg at 08/24/17 0809  . dexmethylphenidate (FOCALIN) tablet 5 mg  5 mg Oral BID Fount Bahe, Ileene Hutchinson, MD  5 mg at 08/24/17 1359  . diclofenac sodium (VOLTAREN) 1 % transdermal gel 2 g  2 g Topical TID PRN Jelani Trueba, Ileene Hutchinson, MD      . famotidine (PEPCID) tablet 20 mg  20 mg Oral Daily PRN Clarice Bonaventure, Ileene Hutchinson, MD   20 mg at 08/21/17 1414  . fluticasone (FLONASE) 50 MCG/ACT nasal spray 2 spray  2 spray Each Nare Daily Clapacs, Jackquline Denmark, MD   2 spray at 08/24/17 0809  . gabapentin (NEURONTIN) capsule 300 mg  300 mg Oral TID Clapacs, John T, MD   300 mg at 08/24/17 1129  . hydrochlorothiazide (HYDRODIURIL) tablet 25 mg  25 mg Oral Daily Clapacs, Jackquline Denmark, MD   25 mg at 08/24/17 0809  . hydrOXYzine (ATARAX/VISTARIL) tablet 50 mg  50 mg Oral TID PRN Haskell Riling, MD   50 mg at 08/23/17 2059  . levothyroxine (SYNTHROID, LEVOTHROID) tablet 50 mcg  50 mcg Oral QAC breakfast Haskell Riling, MD   50 mcg at 08/24/17 [phone]  . loratadine (CLARITIN) tablet 10 mg  10 mg Oral Daily Clapacs, Jackquline Denmark, MD   10 mg at 08/24/17 0809  . magnesium hydroxide (MILK OF MAGNESIA) suspension 30 mL  30 mL Oral Daily PRN Clapacs, John T, MD      . menthol-cetylpyridinium (CEPACOL) lozenge 3 mg  1 lozenge Oral PRN Javarie Crisp, Ileene Hutchinson, MD      . mometasone-formoterol (DULERA) 200-5 MCG/ACT inhaler 2 puff  2 puff Inhalation BID Clapacs, Jackquline Denmark, MD   2 puff at 08/24/17 0809  . nicotine (NICODERM CQ - dosed in mg/24 hours) patch 21 mg  21 mg Transdermal Daily Youssouf Shipley, Ileene Hutchinson, MD   21 mg at 08/24/17 1128  . ondansetron (ZOFRAN-ODT) disintegrating tablet 4 mg  4 mg Oral TID WC PRN Beverly Sessions, MD   4 mg at 08/24/17 1359  . oxyCODONE (Oxy IR/ROXICODONE) immediate release tablet 5 mg  5 mg Oral BID PRN Haskell Riling, MD   5 mg at 08/24/17 0648  . pantoprazole (PROTONIX) EC tablet 20 mg  20 mg Oral BID PRN Haskell Riling, MD   20 mg at 08/20/17 1844   . potassium chloride SA (K-DUR,KLOR-CON) CR tablet 20 mEq  20 mEq Oral BID Nyomi Howser R, MD      . risperiDONE (RISPERDAL) tablet 2 mg  2 mg Oral QHS Clapacs, Jackquline Denmark, MD   2 mg at 08/23/17 2058  . simethicone (MYLICON) chewable tablet 80 mg  80 mg Oral BID PRN Haskell Riling, MD   80 mg at 08/24/17 1052  . sucralfate (CARAFATE) tablet 1 g  1 g Oral TID WC & HS Clapacs, Jackquline Denmark, MD   1 g at 08/24/17 1129  . tiotropium (SPIRIVA) inhalation capsule 18 mcg  18 mcg Inhalation Daily Clapacs, Jackquline Denmark, MD   18 mcg at 08/24/17 0810  . traMADol (ULTRAM) tablet 50 mg  50 mg Oral Q6H PRN Clapacs, Jackquline Denmark, MD   50 mg at 08/21/17 0552  . traZODone (DESYREL) tablet 100 mg  100 mg Oral QHS PRN Clapacs, Jackquline Denmark, MD   100 mg at 08/23/17 2059  . venlafaxine [facility]) tablet 75 mg  75 mg Oral BH-q7a Ardyce Heyer, Ileene Hutchinson, MD   75 mg at 08/24/17 4680   And  . venlafaxine [facility]) tablet 37.5 mg  37.5 mg Oral BID Haskell Riling, MD   37.5 mg at 08/24/17 1129    Lab Results: No results  found for this or any previous visit (from the past 48 hour(s)).  Blood Alcohol level:  Lab Results  Component Value Date   ETH <10 08/19/2017   ETH <5 08/25/2016    Metabolic Disorder Labs: Lab Results  Component Value Date   HGBA1C 4.8 08/19/2017   MPG 91.06 08/19/2017   MPG 114.02 08/26/2016   No results found for: PROLACTIN Lab Results  Component Value Date   CHOL 157 08/19/2017   TRIG 341 (H) 08/19/2017   HDL 40 (L) 08/19/2017   CHOLHDL 3.9 08/19/2017   VLDL 68 (H) 08/19/2017   LDLCALC 49 08/19/2017   LDLCALC 125 (H) 08/26/2016    Physical Findings: AIMS:  , ,  ,  ,    CIWA:    COWS:     Musculoskeletal: Strength & Muscle Tone: within normal limits Gait & Station: normal Patient leans: N/A  Psychiatric Specialty Exam: Physical Exam  Nursing note and vitals reviewed.   Review of Systems  Genitourinary: Negative for dysuria, frequency, hematuria and urgency.    Blood pressure 96/73, pulse (!) 112,  temperature 98.5 F (36.9 C), temperature source Oral, resp. rate 18, height 5' 0.5" (1.537 m), weight 85.3 kg, SpO2 93 %.Body mass index is 36.11 kg/m.  General Appearance: Casual  Eye Contact:  Good  Speech:  Clear and Coherent  Volume:  Normal  Mood:  Depressed  Affect:  Congruent  Thought Process:  Coherent and Goal Directed  Orientation:  Full (Time, Place, and Person)  Thought Content:  Logical  Suicidal Thoughts:  Yes.  without intent/plan  Homicidal Thoughts:  No  Memory:  Immediate;   Fair  Judgement:  Fair  Insight:  Fair  Psychomotor Activity:  Normal  Concentration:  Concentration: Fair  Recall:  [employer] of Knowledge:  Fair  Language:  Fair  Akathisia:  No      Assets:  Resilience  ADL's:  Intact  Cognition:  WNL  Sleep:  Number of Hours: 7.45     Treatment Plan Summary: 42 yo female admitted due to SI. Mood is improving and SI is less intrusive. She is having some staff splitting behaviors and this is causing some worsening of her mood. Goal will be to dsicharge in the next couple days as longer hospitalization has potential to cause worsening SI and mood in someone with borderline personality disorder. She is meeting with her peer support worker today. She reports resolution of all urinary symptoms.   Plan:  Mood disorder -Continue Risperdal 2 mg qhs -Continue Effexor IR 75 mg qam and 37.5 mg BID -Continue Wellbutrin XL 300 mg daily -Continue Gabapentin 300 mg TID  HTN -Continue HCTZ _Will check BMP to monitor potassium -Start Potassium 20 meq BID  Gastric Ulcer -Sucralfate 1 g TID  ADHD -Continue Focalin 5 mg BID  Chronic Pain -Continue tramadol and prn Oxycodone  UTI -Symptoms Resolved, UA not collected yet -Completed course of Keflex  COPD -Continue inhalers  Dispo -Return home when stable.She will need new outpatient follow up  Haskell Riling, MD 08/24/2017, 2:07 PM

## 2017-08-24 NOTE — Plan of Care (Signed)
Patient up in the milieu reporting depression and anxiety today, rating her depression at a 7 and anxiety at a 9. Patient plans on working on her goals today which is to get out and attend group sessions today. Denies having thoughts self harm, HI and AVH. Medication and meal compliant. Milieu remains safe with q 15 minute safety checks.

## 2017-08-25 MED ORDER — GABAPENTIN 600 MG PO TABS: 300.0000 mg | ORAL_TABLET | Freq: Three times a day (TID) | ORAL | 0 refills | Status: DC

## 2017-08-25 MED ORDER — BUPROPION HCL ER (XL) 300 MG PO TB24: 300.0000 mg | ORAL_TABLET | Freq: Every day | ORAL | 0 refills | Status: DC

## 2017-08-25 MED ORDER — RISPERIDONE 2 MG PO TABS: 2.0000 mg | ORAL_TABLET | Freq: Every day | ORAL | 0 refills | Status: DC

## 2017-08-25 MED ORDER — RISPERIDONE 2 MG PO TABS: 2.0000 mg | ORAL_TABLET | Freq: Every day | ORAL | 1 refills | Status: DC

## 2017-08-25 MED ORDER — BUPROPION HCL ER (XL) 300 MG PO TB24: 300.0000 mg | ORAL_TABLET | Freq: Every day | ORAL | 1 refills | Status: DC

## 2017-08-25 MED ORDER — VENLAFAXINE HCL 37.5 MG PO TABS: 37.5000 mg | ORAL_TABLET | Freq: Two times a day (BID) | ORAL | 0 refills | Status: DC

## 2017-08-25 MED ORDER — VENLAFAXINE HCL 75 MG PO TABS: 75.0000 mg | ORAL_TABLET | ORAL | 1 refills | Status: DC

## 2017-08-25 MED ORDER — VENLAFAXINE HCL 37.5 MG PO TABS: 37.5000 mg | ORAL_TABLET | Freq: Two times a day (BID) | ORAL | 1 refills | Status: DC

## 2017-08-25 MED ORDER — GABAPENTIN 600 MG PO TABS: 300.0000 mg | ORAL_TABLET | Freq: Three times a day (TID) | ORAL | 1 refills | Status: DC

## 2017-08-25 MED ORDER — DEXMETHYLPHENIDATE HCL 5 MG PO TABS: 5.0000 mg | ORAL_TABLET | Freq: Two times a day (BID) | ORAL | 0 refills | Status: DC

## 2017-08-25 MED ORDER — VENLAFAXINE HCL 75 MG PO TABS: 75.0000 mg | ORAL_TABLET | ORAL | 0 refills | Status: DC

## 2017-08-25 NOTE — Progress Notes (Signed)
Recreation Therapy Notes   Date: 08/25/2017  Time: 9:30 am   Location: Craft Room   Behavioral response: N/A   Intervention Topic: Self-Care  Discussion/Intervention: Patient did not attend group.   Clinical Observations/Feedback:  Patient did not attend group.   Heavin Sebree LRT/CTRS        Evy Lutterman 08/25/2017 10:45 AM

## 2017-08-25 NOTE — Progress Notes (Signed)
D: Pt denies SI/HI/AVH, verbally contracts for safety. Pt. Reports she can remain safe while on the unit. Pt is pleasant and cooperative. Pt. Complains of gas and given PRN medications with good effect. Pt. Chronic pain being addressed with PRN medications per MD orders.  Patient Interaction appropriate. Pt. Attends snacks and groups. Frequently visible around unit. Pt. Reports depression with no specific number rating. Pt. Reports depression is high.   A: Q x 15 minute observation checks were completed for safety. Patient was provided with education, but needs reinforcement.  Patient was given/offered medications per orders. Patient  was encourage to attend groups, participate in unit activities and continue with plan of care. Pt. Chart and plans of care reviewed. Pt. Given support and encouragement.   R: Patient is complaint with medication and unit procedures.             Precautionary checks every 15 minutes for safety maintained, room free of safety hazards, patient sustains no injury or falls during this shift. Will endorse care to next shift.

## 2017-08-25 NOTE — Progress Notes (Signed)
[facility] MD Progress Note  08/25/2017 12:31 PM Kirsten Richardson  MRN:  [id number] Subjective:  Pt states that she is feeling okay. Mood is still down but feels more hopeful. She is going to continue to lose weight so she feels better about herself. She had a good visit with her peer support yesterday. Pt states taht she is starting to get anxious about being in the hospital because of the things she needs to get done at home. She needs to send in her application for food stamps. She also has an appointment on Thursday to get a IUD placed and does not want to have to reschedule that. She endorse very passive SI that are much less frequent and intense compared to admission. She is able to distract herself and ignore them when they do come. She is sleeping fine. She is eating well. She is still planning to try to get into NA and DBT groups to help strengthen her support network. She feels safe discharging home tomorrow.   Principal Problem: Bipolar 2 disorder, major depressive episode ([place]) Diagnosis:   Patient Active Problem List   Diagnosis Date Noted  . Bipolar 2 disorder, major depressive episode ([place]) [F31.81] 08/25/2016    Priority: High  . Borderline personality disorder ([place]) [F60.3] 09/27/2014    Priority: High  . Peptic ulcer [K27.9] 08/19/2017  . COPD (chronic obstructive pulmonary disease) ([place]) [J44.9] 08/26/2016  . Constipation [K59.00] 08/26/2016  . Essential hypertension [I10] 08/31/2015  . DDD (degenerative disc disease), lumbar [M51.36] 08/02/2015  . DDD (degenerative disc disease), cervical [M50.30] 08/02/2015  . 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 Hip pain, chronic [M25.559, G89.29] 09/27/2014  . SUI (stress urinary incontinence, female) [N39.3] 01/02/2014   Total Time spent with patient: 20 minutes  Past Psychiatric History: See H&P  Past Medical History:  Past Medical History:  Diagnosis Date  . Anemia   . Arthritis   . Back pain    . Bipolar disorder ([place])   . Borderline personality disorder ([place])   . Carpal tunnel syndrome   . Degenerative disc disease, lumbar   . Depression   . Eczema   . Emphysema of lung ([place])   . Emphysema of lung ([place])   . GERD (gastroesophageal reflux disease)   . History of hemorrhoids   . Hypertension   . Hypothyroidism   . Neck pain   . Plantar fasciitis   . Plantar fasciitis   . Scoliosis   . SUI (stress urinary incontinence, female)   . Thyroid disease   . Vitamin B 12 deficiency     Past Surgical History:  Procedure Laterality Date  . CARPAL TUNNEL RELEASE Right 2018   then left done a few weeks later  . COLONOSCOPY WITH PROPOFOL N/A 04/02/2017   Procedure: COLONOSCOPY WITH PROPOFOL;  Surgeon: Lollie Sails, MD;  Location: [facility] ENDOSCOPY;  Service: Endoscopy;  Laterality: N/A;  . ESOPHAGOGASTRODUODENOSCOPY (EGD) WITH PROPOFOL N/A 07/21/2017   Procedure: ESOPHAGOGASTRODUODENOSCOPY (EGD) WITH PROPOFOL;  Surgeon: Lollie Sails, MD;  Location: [facility] ENDOSCOPY;  Service: Endoscopy;  Laterality: N/A;  . FOOT SURGERY Right    plantar fasciatis  . HIP FRACTURE SURGERY Bilateral   . TUBAL LIGATION    . WISDOM TOOTH EXTRACTION  09/2005   Family History:  Family History  Problem Relation Age of Onset  . Arthritis Mother   . COPD Mother   . Cancer Mother   . Depression Mother   . Early death Mother   .  Vision loss Mother   . Mental illness Mother   . Alcohol abuse Father   . Arthritis Father   . Cancer Father   . Diabetes Father   . Drug abuse Father   . Early death Father   . Vision loss Father   . Heart disease Father   . Hypertension Father   . Mental illness Father   . Stroke Father   . Lung cancer Sister   . Pancreatitis Brother   . Hypertension Brother   . Diabetes Brother   . Diverticulitis Brother   . Cirrhosis Brother   . Hypertension Paternal Grandmother   . Heart disease Paternal Grandmother    Family Psychiatric  History: See H&P Social  History:  Social History   Substance and Sexual Activity  Alcohol Use No  . Alcohol/week: 0.0 standard drinks     Social History   Substance and Sexual Activity  Drug Use No    Social History   Socioeconomic History  . Marital status: Single    Spouse name: Not on file  . Number of children: Not on file  . Years of education: Not on file  . Highest education level: Not on file  Occupational History  . Not on file  Social Needs  . Financial resource strain: Not on file  . Food insecurity:    Worry: Not on file    Inability: Not on file  . Transportation needs:    Medical: Not on file    Non-medical: Not on file  Tobacco Use  . Smoking status: Former Smoker    Packs/day: 0.20    Years: 5.00    Pack years: 1.00    Types: Cigarettes    Last attempt to quit: 07/25/2015    Years since quitting: 2.0  . Smokeless tobacco: Never Used  Substance and Sexual Activity  . Alcohol use: No    Alcohol/week: 0.0 standard drinks  . Drug use: No  . Sexual activity: Yes    Birth control/protection: Pill  Lifestyle  . Physical activity:    Days per week: Not on file    Minutes per session: Not on file  . Stress: Not on file  Relationships  . Social connections:    Talks on phone: Not on file    Gets together: Not on file    Attends religious service: Not on file    Active member of club or organization: Not on file    Attends meetings of clubs or organizations: Not on file    Relationship status: Not on file  Other Topics Concern  . Not on file  Social History Narrative  . Not on file   Additional Social History:                         Sleep: Good  Appetite:  Good  Current Medications: Current Facility-Administered Medications  Medication Dose Route Frequency Provider Last Rate Last Dose  . acetaminophen (TYLENOL) tablet 650 mg  650 mg Oral Q6H PRN Clapacs, Madie Reno, MD   650 mg at 08/20/17 1844  . albuterol (PROVENTIL HFA;VENTOLIN HFA) 108 (90 Base)  MCG/ACT inhaler 2 puff  2 puff Inhalation Q6H PRN Clapacs, Madie Reno, MD   2 puff at 08/24/17 1820  . alum & mag hydroxide-simeth (MAALOX/MYLANTA) 200-200-20 MG/5ML suspension 30 mL  30 mL Oral Q4H PRN Clapacs, John T, MD      . buPROPion (WELLBUTRIN XL) 24 hr tablet 300 mg  300 mg Oral Daily Kingdavid Leinbach, Tyson Babinski, MD   300 mg at 08/25/17 6010  . dexmethylphenidate (FOCALIN) tablet 5 mg  5 mg Oral BID Marylin Crosby, MD   5 mg at 08/25/17 9323  . diclofenac sodium (VOLTAREN) 1 % transdermal gel 2 g  2 g Topical TID PRN Jelani Vreeland, Tyson Babinski, MD      . famotidine (PEPCID) tablet 20 mg  20 mg Oral Daily PRN Eames Dibiasio, Tyson Babinski, MD   20 mg at 08/21/17 1414  . fluticasone (FLONASE) 50 MCG/ACT nasal spray 2 spray  2 spray Each Nare Daily Clapacs, Madie Reno, MD   2 spray at 08/25/17 [phone]  . gabapentin (NEURONTIN) capsule 300 mg  300 mg Oral TID Clapacs, John T, MD   300 mg at 08/25/17 1139  . hydrochlorothiazide (HYDRODIURIL) tablet 25 mg  25 mg Oral Daily Clapacs, John T, MD   25 mg at 08/25/17 2202  . hydrOXYzine (ATARAX/VISTARIL) tablet 50 mg  50 mg Oral TID PRN Marylin Crosby, MD   50 mg at 08/25/17 0844  . levothyroxine (SYNTHROID, LEVOTHROID) tablet 50 mcg  50 mcg Oral QAC breakfast Marylin Crosby, MD   50 mcg at 08/25/17 [phone]  . loratadine (CLARITIN) tablet 10 mg  10 mg Oral Daily Clapacs, Madie Reno, MD   10 mg at 08/25/17 0623  . magnesium hydroxide (MILK OF MAGNESIA) suspension 30 mL  30 mL Oral Daily PRN Clapacs, John T, MD      . menthol-cetylpyridinium (CEPACOL) lozenge 3 mg  1 lozenge Oral PRN Alicha Raspberry, Tyson Babinski, MD      . mometasone-formoterol (DULERA) 200-5 MCG/ACT inhaler 2 puff  2 puff Inhalation BID Clapacs, Madie Reno, MD   2 puff at 08/25/17 [phone]  . nicotine (NICODERM CQ - dosed in mg/24 hours) patch 21 mg  21 mg Transdermal Daily Lyrical Sowle, Tyson Babinski, MD   21 mg at 08/25/17 0857  . ondansetron (ZOFRAN-ODT) disintegrating tablet 4 mg  4 mg Oral TID WC PRN Lenward Chancellor, MD   4 mg at 08/24/17 1359  . oxyCODONE (Oxy  IR/ROXICODONE) immediate release tablet 5 mg  5 mg Oral BID PRN Marylin Crosby, MD   5 mg at 08/25/17 0646  . pantoprazole (PROTONIX) EC tablet 20 mg  20 mg Oral BID PRN Marylin Crosby, MD   20 mg at 08/20/17 1844  . risperiDONE (RISPERDAL) tablet 2 mg  2 mg Oral QHS Clapacs, Madie Reno, MD   2 mg at 08/24/17 2131  . simethicone (MYLICON) chewable tablet 80 mg  80 mg Oral BID PRN Marylin Crosby, MD   80 mg at 08/24/17 2135  . sucralfate (CARAFATE) tablet 1 g  1 g Oral TID WC & HS Clapacs, John T, MD   1 g at 08/25/17 1139  . tiotropium (SPIRIVA) inhalation capsule 18 mcg  18 mcg Inhalation Daily Clapacs, Madie Reno, MD   18 mcg at 08/25/17 [phone]  . traMADol (ULTRAM) tablet 50 mg  50 mg Oral Q6H PRN Clapacs, Madie Reno, MD   50 mg at 08/25/17 1130  . traZODone (DESYREL) tablet 100 mg  100 mg Oral QHS PRN Clapacs, Madie Reno, MD   100 mg at 08/24/17 2131  . venlafaxine [facility]) tablet 75 mg  75 mg Oral BH-q7a Rosangelica Pevehouse, Tyson Babinski, MD   75 mg at 08/25/17 7616   And  . venlafaxine [facility]) tablet 37.5 mg  37.5 mg Oral BID Shermar Friedland, Tyson Babinski, MD   37.5 mg  at 08/25/17 1139    Lab Results:  Results for orders placed or performed during the hospital encounter of 08/19/17 (from the past 48 hour(s))  Basic metabolic panel     Status: Abnormal   Collection Time: 08/24/17  7:35 PM  Result Value Ref Range   Sodium 142 135 - 145 mmol/L   Potassium 4.9 3.5 - 5.1 mmol/L   Chloride 102 98 - 111 mmol/L   CO2 31 22 - 32 mmol/L   Glucose, Bld 136 (H) 70 - 99 mg/dL   BUN 15 6 - 20 mg/dL   Creatinine, Ser 0.96 0.44 - 1.00 mg/dL   Calcium 9.6 8.9 - 10.3 mg/dL   GFR calc non Af Amer >60 >60 mL/min   GFR calc Af Amer >60 >60 mL/min    Comment: (NOTE) The eGFR has been calculated using the CKD EPI equation. This calculation has not been validated in all clinical situations. eGFR's persistently <60 mL/min signify possible Chronic Kidney Disease.    Anion gap 9 5 - 15    Comment: Performed at [facility], [address] [place], [place] [address]    Blood Alcohol level:  Lab Results  Component Value Date   [facility] <10 08/19/2017   ETH <5 17/51/0258    Metabolic Disorder Labs: Lab Results  Component Value Date   HGBA1C 4.8 08/19/2017   MPG 91.06 08/19/2017   MPG 114.02 08/26/2016   No results found for: PROLACTIN Lab Results  Component Value Date   CHOL 157 08/19/2017   TRIG 341 (H) 08/19/2017   HDL 40 (L) 08/19/2017   CHOLHDL 3.9 08/19/2017   VLDL 68 (H) 08/19/2017   LDLCALC 49 08/19/2017   LDLCALC 125 (H) 08/26/2016    Physical Findings: AIMS:  , ,  ,  ,    CIWA:    COWS:     Musculoskeletal: Strength & Muscle Tone: within normal limits Gait & Station: normal Patient leans: N/A  Psychiatric Specialty Exam: Physical Exam  Nursing note and vitals reviewed.   Review of Systems  All other systems reviewed and are negative.   Blood pressure 109/63, pulse 100, temperature 98.8 F (37.1 C), temperature source Oral, resp. rate 18, height 5' 0.5" (1.537 m), weight 85.3 kg, SpO2 95 %.Body mass index is 36.11 kg/m.  General Appearance: Casual  Eye Contact:  Good  Speech:  Clear and Coherent  Volume:  Normal  Mood:  Depressed  Affect:  Appropriate  Thought Process:  Coherent and Goal Directed  Orientation:  Full (Time, Place, and Person)  Thought Content:  Logical  Suicidal Thoughts:  Yes, passive, with no plan or intent  Homicidal Thoughts:  No  Memory:  Immediate;   Fair  Judgement:  Fair  Insight:  Fair  Psychomotor Activity:  Normal  Concentration:  Concentration: Fair  Recall:  [employer] of Knowledge:  Fair  Language:  Fair  Akathisia:  No      Assets:  Resilience  ADL's:  Intact  Cognition:  WNL  Sleep:  Number of Hours: 8     Treatment Plan Summary: 42 yo female admitted due to depression and SI. SI is resolving. Will likely discharge home tomorrow.   Plan:  Mood disorder -Continue Risperdal 2 mg qhs -Continue Effexor IR 75 mg qam and 37.5 mg BID -Continue  Wellbutrin XL 300 mg daily -Continue Gabapentin 300 mg TID  HTN -Continue HCTZ -K normal -Will stop scheduled potassium as K is at high normal range  of 4.9  Gastric Ulcer -Sucralfate 1 g TID  ADHD -Continue Focalin 5 mg BID  UTI -Symptoms Resolved, UA not collected yet -Completed course of Keflex  COPD -Continue inhalers  Dispo -Return home when stable. Likely discharge tomorrow. She will need new outpatient follow up   Marylin Crosby, MD 08/25/2017, 12:31 PM

## 2017-08-25 NOTE — BHH Group Notes (Signed)
LCSW Group Therapy Note   08/24/2017 1:00pm   Type of Therapy and Topic:  Group Therapy:  Overcoming Obstacles   Participation Level:  Minimal   Description of Group:    In this group patients will be encouraged to explore what they see as obstacles to their own wellness and recovery. They will be guided to discuss their thoughts, feelings, and behaviors related to these obstacles. The group will process together ways to cope with barriers, with attention given to specific choices patients can make. Each patient will be challenged to identify changes they are motivated to make in order to overcome their obstacles. This group will be process-oriented, with patients participating in exploration of their own experiences as well as giving and receiving support and challenge from other group members.   Therapeutic Goals: 1. Patient will identify personal and current obstacles as they relate to admission. 2. Patient will identify barriers that currently interfere with their wellness or overcoming obstacles.  3. Patient will identify feelings, thought process and behaviors related to these barriers. 4. Patient will identify two changes they are willing to make to overcome these obstacles:      Summary of Patient Progress Pt able to meet therapeutic goals.  Shared minimally about her personal experience leading to admission, but participated in discussion about strategies to overcome obstacles.   Therapeutic Modalities:   Cognitive Behavioral Therapy Solution Focused Therapy Motivational Interviewing Relapse Prevention Therapy  Glennon Mac, LCSW 08/25/2017 1:49 PM

## 2017-08-25 NOTE — BHH Group Notes (Signed)
LCSW Group Therapy Note 08/25/2017 1:00pm  Type of Therapy and Topic:  Group Therapy:  Setting Goals  Participation Level:  Did Not Attend  Description of Group: In this process group, patients discussed using strengths to work toward goals and address challenges.  Patients identified two positive things about themselves and one goal they were working on.  Patients were given the opportunity to share openly and support each other's plan for self-empowerment.  The group discussed the value of gratitude and were encouraged to have a daily reflection of positive characteristics or circumstances.  Patients were encouraged to identify a plan to utilize their strengths to work on current challenges and goals.  Therapeutic Goals 1. Patient will verbalize personal strengths/positive qualities and relate how these can assist with achieving desired personal goals 2. Patients will verbalize affirmation of peers plans for personal change and goal setting 3. Patients will explore the value of gratitude and positive focus as related to successful achievement of goals 4. Patients will verbalize a plan for regular reinforcement of personal positive qualities and circumstances.  Summary of Patient Progress:       Therapeutic Modalities Cognitive Behavioral Therapy Motivational Interviewing    Glennon Mac, LCSW 08/25/2017 1:48 PM

## 2017-08-25 NOTE — Plan of Care (Signed)
Pt. Complaint with medications and unit procedures. Pt. Denies SI/Hi and verbally is able to contract for safety. Pt. Reports she can remain safe while on the unit.    Problem: Health Behavior/Discharge Planning: Goal: Compliance with treatment plan for underlying cause of condition will improve Outcome: Progressing   Problem: Safety: Goal: Periods of time without injury will increase Outcome: Progressing   Problem: Medication: Goal: Compliance with prescribed medication regimen will improve Outcome: Progressing

## 2017-08-25 NOTE — Plan of Care (Signed)
Pt. Complaint with medications. Pt. Denies Si/Hi and verbally is able to contract for safety. Pt. Reports she can remain safe while on the unit.    Problem: Health Behavior/Discharge Planning: Goal: Compliance with treatment plan for underlying cause of condition will improve Outcome: Progressing   Problem: Safety: Goal: Periods of time without injury will increase Outcome: Progressing

## 2017-08-25 NOTE — Progress Notes (Signed)
D: Patient stated slept good last night .Stated appetite is good and energy level  Is normal. Stated concentration is good . Stated on Depression scale 7 , hopeless 6 and anxiety 8 .( low 0-10 high) Denies suicidal  homicidal ideations  .  No auditory hallucinations  No pain concerns . Appropriate ADL'S. Interacting with peers and staff. Patient understanding  of  information received  . Voice no concerns  around sleep. Voice of no injury or safety concerns . Attending unit programing, working on decisions and able to verbalize feelings  making and coping  skills . Compliant with  medication  and  understanding of them . patient aware  Of discharge tomorrow. Voice of her stay here being beneficial   . Voice of having to relocate to ex husband  Home  With  Son  Until she can find permanent  Residence. A: Encourage patient participation with unit programming . Instruction  Given on  Medication , verbalize understanding. R: Voice no other concerns. Staff continue to monitor

## 2017-08-25 NOTE — BHH Counselor (Signed)
CSW called the patients peer support Cyndi from [facility] [phone]) to coordinate discharge plans with her. CSW left a voicemail with a callback number. CSW will follow up soon.  Kirsten Richardson, MSW, Theresia Majors, Bridget Hartshorn Clinical Social Worker 08/25/2017 9:07 AM

## 2017-08-25 NOTE — Progress Notes (Signed)
D: Pt denies SI/HI/AVH, verbally able to contract for safety. Pt. Reports she can remain safe while on the unit. Pt is pleasant and cooperative overall, but can be very demanding at times about pain medications. Pt. Pain being address by MD orders for reported chronic pain. Pt. Reports depression still this evening at, "6/10".  Patient Interaction appropriate overall. No other notable behaviors. Pt. Attends snacks and groups.   A: Q x 15 minute observation checks were completed for safety. Patient was provided with education, but needs reinforcement.  Patient was given/offered medications per orders. Patient  was encourage to attend groups, participate in unit activities and continue with plan of care. Pt. Chart and plans of care reviewed. Pt. Given support and encouragement.   R: Patient is complaint with medication and unit procedures.             Precautionary checks every 15 minutes for safety maintained, room free of safety hazards, patient sustains no injury or falls during this shift. Will endorse care to next shift.

## 2017-08-25 NOTE — Plan of Care (Signed)
Patient understanding  of  information received  . Voice no concerns  around sleep. Voice of no injury or safety concerns . Attending unit programing, working on decisions and able to verbalize feelings  making and coping  skills . Compliant with  medication  and  understanding of them .  Problem: Education: Goal: Knowledge of [place] [facility] Education information/materials will improve Outcome: Progressing   Problem: Activity: Goal: Sleeping patterns will improve Outcome: Progressing   Problem: Health Behavior/Discharge Planning: Goal: Compliance with treatment plan for underlying cause of condition will improve Outcome: Progressing   Problem: Safety: Goal: Periods of time without injury will increase Outcome: Progressing   Problem: Education: Goal: Ability to make informed decisions regarding treatment will improve Outcome: Progressing   Problem: Medication: Goal: Compliance with prescribed medication regimen will improve Outcome: Progressing   Problem: Self-Concept: Goal: Will verbalize positive feelings about self Outcome: Progressing   Problem: Activity: Goal: Will identify at least one activity in which they can participate Outcome: Progressing   Problem: Self-Concept: Goal: Will verbalize positive feelings about self Outcome: Progressing   Problem: Coping: Goal: Coping ability will improve Outcome: Progressing Goal: Will verbalize feelings Outcome: Progressing   Problem: Health Behavior/Discharge Planning: Goal: Compliance with therapeutic regimen will improve Outcome: Progressing   Problem: Education: Goal: Knowledge of General Education information will improve Description Including pain rating scale, medication(s)/side effects and non-pharmacologic comfort measures Outcome: Progressing

## 2017-08-25 NOTE — BHH Group Notes (Signed)

## 2017-08-26 MED ORDER — TRAZODONE HCL 100 MG PO TABS: 100.0000 mg | ORAL_TABLET | Freq: Every evening | ORAL | 1 refills | Status: DC | PRN

## 2017-08-26 MED ORDER — HYDROXYZINE HCL 50 MG PO TABS: 50.0000 mg | ORAL_TABLET | Freq: Three times a day (TID) | ORAL | 1 refills | Status: DC | PRN

## 2017-08-26 NOTE — BHH Suicide Risk Assessment (Signed)
[facility] Discharge Suicide Risk Assessment   Principal Problem: Bipolar 2 disorder, major depressive episode [facility]) Discharge Diagnoses:  Patient Active Problem List   Diagnosis Date Noted  . Bipolar 2 disorder, major depressive episode ([facility]) [F31.81] 08/25/2016    Priority: High  . Borderline personality disorder ([facility]) [F60.3] 09/27/2014    Priority: High  . Peptic ulcer [K27.9] 08/19/2017  . COPD (chronic obstructive pulmonary disease) (H[facility]) [J44.9] 08/26/2016  . Constipation [K59.00] 08/26/2016  . Essential hypertension [I10] 08/31/2015  . DDD (degenerative disc disease), lumbar [M51.36] 08/02/2015  . DDD (degenerative disc disease), cervical [M50.30] 08/02/2015  . 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 Hip pain, chronic [M25.559, G89.29] 09/27/2014  . SUI (stress urinary incontinence, female) [N39.3] 01/02/2014    Mental Status Per Nursing Assessment::   On Admission:  Self-harm thoughts, Belief that plan would result in death  Demographic Factors:  Caucasian and Unemployed  Loss Factors: NA  Historical Factors: Prior suicide attempts and Impulsivity  Risk Reduction Factors:   Living with another person, especially a relative, Positive social support and Positive therapeutic relationship  Continued Clinical Symptoms:  Personality Disorders:   Cluster B  Cognitive Features That Contribute To Risk:  None    Suicide Risk:  Mild:  Suicidal ideation of limited frequency, intensity, duration, and specificity.  There are no identifiable plans, no associated intent, mild dysphoria and related symptoms, good self-control (both objective and subjective assessment), few other risk factors, and identifiable protective factors, including available and accessible social support.  Follow-up Information    [facility] [employer] Go on 08/27/2017.   Why:  Please follow up Thursday August 27, 2017 at 9am. Please bring your ID and insurance card.  Thank you. Contact information: [address] [place] [address] [phone]        [facility] Go on 08/26/2017.   Why:  Your Peer Support Cyndi will come and meet with you once you are home today. Please give her a call when you get home. Thank you. Contact information: [address] [place] [address] [phone]             Haskell Riling, MD 08/26/2017, 9:17 AM

## 2017-08-26 NOTE — Progress Notes (Signed)
  [facility] Adult Case Management Discharge Plan :  Will you be returning to the same living situation after discharge:  Yes,  Home At discharge, do you have transportation home?: Yes,  Patient drove herself to the hospital. Do you have the ability to pay for your medications: Yes,  Insurance  Release of information consent forms completed and in the chart;  Patient's signature needed at discharge.  Patient to Follow up at: Follow-up Information    [facility], [facility]. Go on 08/27/2017.   Why:  Please follow up Thursday August 27, 2017 at 9am. Please bring your ID and insurance card. Thank you. Contact information: [address] [place] [address] [phone]        [facility] Go on 08/26/2017.   Why:  Your Peer Support Cyndi will come and meet with you once you are home today. Please give her a call when you get home. Thank you. Contact information: [address] [place] [address] [phone]           Next level of care provider has access to [facility] Link:no  Safety Planning and Suicide Prevention discussed: Yes,  Patient refused family contact. CSW did speak with the patients Peer Support from [facility].   Have you used any form of tobacco in the last 30 days? (Cigarettes, Smokeless Tobacco, Cigars, and/or Pipes): Yes  Has patient been referred to the Quitline?: Patient refused referral  Patient has been referred for addiction treatment: N/A  Johny Shears, LCSW 08/26/2017, 9:16 AM

## 2017-08-26 NOTE — Progress Notes (Signed)
Patient denies SI/HI, denies A/V hallucinations. Patient verbalizes understanding of discharge instructions, follow up care and prescriptions. Patient given all belongings from [facility] locker.Home medications returned. Patient escorted out by staff, transported by herself.

## 2017-08-26 NOTE — Discharge Summary (Signed)
Physician Discharge Summary Note  Patient:  Kirsten Richardson is an 42 y.o., female MRN:  [id number] DOB:  05/28/1975 Patient phone:  [phone] (home)  Patient address:   [address] [place] [address],  Total Time spent with patient: 20 minutes  Plus 20 minutes of medication reconciliation, discharge planning, and discharge documentation   Date of Admission:  08/19/2017 Date of Discharge: 08/26/17  Reason for Admission:   42 yo female admitted due to suicidal thoughts. Pt states that has many chronic medical issues which has worsened depression. She went to her PCP and told him that she "would either kill myself or be 600 lbs and die of being fat." She states, "He sent me to the psych ward." She reports long history of suicidal thoughts which are chronic. She states that she has a lot of medical issues which cause most of the suicidal thoughts. She states that she has COPD and has a lot of trouble breathing which is difficult for her. She quit smoking and gained 45 lbs in a year. She states that she has tried everything to try to lose weight but nothing is helping. She states that she can't exercise because she has trouble breathing so this complicates things. She stopped eating healthy because this was not working. She states that she takes caffeine pills to help but that has not been working either. She states that she started having more suicidal thoughts than usual over the past 3 weeks. She went to [facility] "in a suicidal crisis" and they helped her by talking to her. She states that she was "definitely suicidal yesterday." Today, she states that "If something doesn't change soon than I could kill myself but not immediately." She states that she is religious and this has stopped her in the past but states "I believe that suicide is an unrepented sin." He states, "They always say that he won't give you more than you can handle btu I feel like he underestimated with me." She reports several past  suicide attempts. She talks about these very matter of factly and with a smile on her face. She states, "if those didn't work the God wants me here for a reason." She states that if she were to kill herself now she would "slit my throat because pills haven't worked." She states that her moods have been all over the past. She states they can change on a daily basis. She also reports "when I'm manic. I get really happy for 2 weeks and then I crash." She states that these episodes are much less frequent and shorter in course than int he past. She feels the medications help with that. She states that she typically sleeps well when she takes melatonin. She states that she picks her skin a lot when she is nervous and is 'something I can't help."  She has a peer support worker and she has been extremely helpful for her. She states that she is available to her when she needs someone to talk to her. She goes to peer support group on Tuesday but has missing groups because of other doctors appointments. She states that she is going to start scheduling her other appointments for other days so she can go to the groups again. She also is supposed to start another group with [facility]. She states that they want her to switch to [place] because her insurance has changed. She is able to continue peer support with [facility]. She has upcoming procedures for endoscopy for ulcers  which she plans to attend. Pt states that she has been off her medications for a few days and has noticed a decline in her mood. She states that she is on "over 30 medications and I know which ones I need." She states that she wants to lower Effexor and go up on Wellbutrin for her mood. She states that the Effexor "Makes me numb." She states that she used to be on over 300 mg a day but they lowered the dose about a year ago. She reports AH of "hearing my name called." She would like to Keep Risperdal at the current dose because any higher caused "worsening voices."    Principal Problem: Bipolar 2 disorder, major depressive episode [facility]) Discharge Diagnoses: Patient Active Problem List   Diagnosis Date Noted  . Bipolar 2 disorder, major depressive episode (HCC) [F31.81] 08/25/2016    Priority: High  . Borderline personality disorder (HCC) [F60.3] 09/27/2014    Priority: High  . Peptic ulcer [K27.9] 08/19/2017  . COPD (chronic obstructive pulmonary disease) (HCC) [J44.9] 08/26/2016  . Constipation [K59.00] 08/26/2016  . Essential hypertension [I10] 08/31/2015  . DDD (degenerative disc disease), lumbar [M51.36] 08/02/2015  . DDD (degenerative disc disease), cervical [M50.30] 08/02/2015  . 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 Hip pain, chronic [M25.559, G89.29] 09/27/2014  . SUI (stress urinary incontinence, female) [N39.3] 01/02/2014    Past Psychiatric History: See H&P  Past Medical History:  Past Medical History:  Diagnosis Date  . Anemia   . Arthritis   . Back pain   . Bipolar disorder (HCC)   . Borderline personality disorder (HCC)   . Carpal tunnel syndrome   . Degenerative disc disease, lumbar   . Depression   . Eczema   . Emphysema of lung (HCC)   . Emphysema of lung (HCC)   . GERD (gastroesophageal reflux disease)   . History of hemorrhoids   . Hypertension   . Hypothyroidism   . Neck pain   . Plantar fasciitis   . Plantar fasciitis   . Scoliosis   . SUI (stress urinary incontinence, female)   . Thyroid disease   . Vitamin B 12 deficiency     Past Surgical History:  Procedure Laterality Date  . CARPAL TUNNEL RELEASE Right 2018   then left done a few weeks later  . COLONOSCOPY WITH PROPOFOL N/A 04/02/2017   Procedure: COLONOSCOPY WITH PROPOFOL;  Surgeon: Christena Deem, MD;  Location[facility] ENDOSCOPY;  Service: Endoscopy;  Laterality: N/A;  . ESOPHAGOGASTRODUODENOSCOPY (EGD) WITH PROPOFOL N/A 07/21/2017   Procedure: ESOPHAGOGASTRODUODENOSCOPY (EGD) WITH PROPOFOL;  Surgeon: Christena Deem, MD;  Location[facility] ENDOSCOPY;  Service: Endoscopy;  Laterality: N/A;  . FOOT SURGERY Right    plantar fasciatis  . HIP FRACTURE SURGERY Bilateral   . TUBAL LIGATION    . WISDOM TOOTH EXTRACTION  09/2005   Family History:  Family History  Problem Relation Age of Onset  . Arthritis Mother   . COPD Mother   . Cancer Mother   . Depression Mother   . Early death Mother   . Vision loss Mother   . Mental illness Mother   . Alcohol abuse Father   . Arthritis Father   . Cancer Father   . Diabetes Father   . Drug abuse Father   . Early death Father   . Vision loss Father   . Heart disease Father   . Hypertension Father   . Mental illness Father   .  Stroke Father   . Lung cancer Sister   . Pancreatitis Brother   . Hypertension Brother   . Diabetes Brother   . Diverticulitis Brother   . Cirrhosis Brother   . Hypertension Paternal Grandmother   . Heart disease Paternal Grandmother    Family Psychiatric  History: See h&P Social History:  Social History   Substance and Sexual Activity  Alcohol Use No  . Alcohol/week: 0.0 standard drinks     Social History   Substance and Sexual Activity  Drug Use No    Social History   Socioeconomic History  . Marital status: Single    Spouse name: Not on file  . Number of children: Not on file  . Years of education: Not on file  . Highest education level: Not on file  Occupational History  . Not on file  Social Needs  . Financial resource strain: Not on file  . Food insecurity:    Worry: Not on file    Inability: Not on file  . Transportation needs:    Medical: Not on file    Non-medical: Not on file  Tobacco Use  . Smoking status: Former Smoker    Packs/day: 0.20    Years: 5.00    Pack years: 1.00    Types: Cigarettes    Last attempt to quit: 07/25/2015    Years since quitting: 2.0  . Smokeless tobacco: Never Used  Substance and Sexual Activity  . Alcohol use: No    Alcohol/week: 0.0 standard drinks  . Drug  use: No  . Sexual activity: Yes    Birth control/protection: Pill  Lifestyle  . Physical activity:    Days per week: Not on file    Minutes per session: Not on file  . Stress: Not on file  Relationships  . Social connections:    Talks on phone: Not on file    Gets together: Not on file    Attends religious service: Not on file    Active member of club or organization: Not on file    Attends meetings of clubs or organizations: Not on file    Relationship status: Not on file  Other Topics Concern  . Not on file  Social History Narrative  . Not on file    Hospital Course:  Pt was restarted on home medications. She was wanting to taper off Effexor so this was lowered to 75 mg qam and 37.5 mg BID. Wellbutrin was increased to 300 mg daily. She was very medication seeking during hospitalization. She did begin to have some staff splitting behaviors during hospitalization. On day of discharge, she reported slight improvement in mood. She endorsed SI were much better. She still had occasional fleeting thoughts of suicide but denied plan or intent. She was future oriented and planned to get her food stamp application in. She requested a letter stating that she was in the hospital. She also has ob/gyn appointment tomorrow that she planned to attend. She had brighter affect. She was wondering if her peer support hours could be increased. She was going to talk to [facility] about this. She denied AH, VH, HI. Sh felt good with where he medications were. She spent a lot of time journaling and reading which was helpful for her.Safely plan was discussed and she reported that she has crisis numbers now that she is going to utilize. She will also return to the ED if she were to have thoughts of harming herself.   The patient is  at low risk of imminent suicide. Patient Reported very passive thoughts of harming herself, denied intent, or plan for harm to self or others, expressed significant future orientation, and  expressed an ability to mobilize assistance for her needs. She is presently void of any contributing psychiatric symptoms, cognitive difficulties, or substance use which would elevate her risk for lethality. Chronic risk for lethality is elevated in light of borderline personality disorder, poor stress tolerance. The chronic risk is presently mitigated by her ongoing desire and engagement in [facility] treatment and mobilization of support from family and friends. Chronic risk may elevate if she experiences any significant loss or worsening of symptoms, which can be managed and monitored through outpatient providers. At this time,a cute risk for lethality is low and she is stable for ongoing outpatient management.   Modifiable risk factors were addressed during this hospitalization through appropriate pharmacotherapy and establishment of outpatient follow-up treatment. Some risk factors for suicide are situational (i.e. Unstable housing) or related personality pathology (i.e. Poor coping mechanisms) and thus cannot be further mitigated by continued hospitalization in this setting.    Physical Findings: AIMS:  , ,  ,  ,    CIWA:    COWS:     Musculoskeletal: Strength & Muscle Tone: within normal limits Gait & Station: normal Patient leans: N/A  Psychiatric Specialty Exam: Physical Exam  Nursing note and vitals reviewed.   Review of Systems  All other systems reviewed and are negative.   Blood pressure 109/73, pulse 95, temperature 98.1 F (36.7 C), temperature source Oral, resp. rate 18, height 5' 0.5" (1.537 m), weight 85.3 kg, SpO2 94 %.Body mass index is 36.11 kg/m.  General Appearance: Casual  Eye Contact:  Good  Speech:  Clear and Coherent  Volume:  Normal  Mood:  Depressed  Affect:  Appropriate  Thought Process:  Coherent and Goal Directed  Orientation:  Full (Time, Place, and Person)  Thought Content:  Logical  Suicidal Thoughts:  Yes.  without intent/plan  Homicidal Thoughts:  No   Memory:  Immediate;   Fair  Judgement:  Fair  Insight:  Fair  Psychomotor Activity:  Normal  Concentration:  Concentration: Fair  Recall:  Fair  Fund of Knowledge:  Fair  Language:  Fair  Akathisia:  No      Assets:  Resilience  ADL's:  Intact  Cognition:  WNL  Sleep:  Number of Hours: 7.3     Have you used any form of tobacco in the last 30 days? (Cigarettes, Smokeless Tobacco, Cigars, and/or Pipes): Yes  Has this patient used any form of tobacco in the last 30 days? (Cigarettes, Smokeless Tobacco, Cigars, and/or Pipes) Yes, Yes, A prescription for an FDA-approved tobacco cessation medication was offered at discharge and the patient refused  Blood Alcohol level:  Lab Results  Component Value Date   [facility] <10 08/19/2017   ETH <5 08/25/2016    Metabolic Disorder Labs:  Lab Results  Component Value Date   HGBA1C 4.8 08/19/2017   MPG 91.06 08/19/2017   MPG 114.02 08/26/2016   No results found for: PROLACTIN Lab Results  Component Value Date   CHOL 157 08/19/2017   TRIG 341 (H) 08/19/2017   HDL 40 (L) 08/19/2017   CHOLHDL 3.9 08/19/2017   VLDL 68 (H) 08/19/2017   LDLCALC 49 08/19/2017   LDLCALC 125 (H) 08/26/2016    See Psychiatric Specialty Exam and Suicide Risk Assessment completed by Attending Physician prior to discharge.  Discharge destination:  Home  Is patient on multiple antipsychotic therapies at discharge:  No   Has Patient had three or more failed trials of antipsychotic monotherapy by history:  No  Recommended Plan for Multiple Antipsychotic Therapies: NA  Discharge Instructions    Diet - low sodium heart healthy   Complete by:  As directed    Increase activity slowly   Complete by:  As directed      Allergies as of 08/26/2017      Reactions   Linzess [linaclotide] Nausea Only      Medication List    TAKE these medications     Indication  albuterol 108 (90 Base) MCG/ACT inhaler Commonly known as:  PROVENTIL HFA;VENTOLIN HFA Inhale into  the lungs.  Indication:  Disease Involving Spasms of the Bronchus   budesonide-formoterol 160-4.5 MCG/ACT inhaler Commonly known as:  SYMBICORT Inhale 2 puffs into the lungs 2 (two) times daily.  Indication:  Chronic Obstructive Lung Disease   budesonide-formoterol 160-4.5 MCG/ACT inhaler Commonly known as:  SYMBICORT Inhale 2 puffs into the lungs 2 (two) times daily.  Indication:  Chronic Obstructive Lung Disease   buPROPion 300 MG 24 hr tablet Commonly known as:  WELLBUTRIN XL Take 1 tablet (300 mg total) by mouth daily. What changed:    medication strength  how much to take  Indication:  Major Depressive Disorder   cetirizine 10 MG tablet Commonly known as:  ZYRTEC Take 10 mg by mouth daily.  Indication:  Chronic Hives   clindamycin 1 % lotion Commonly known as:  CLEOCIN T Apply topically 2 (two) times daily as needed.  Indication:  Hair Follicle Inflammation   dexmethylphenidate 5 MG tablet Commonly known as:  FOCALIN Take 1 tablet (5 mg total) by mouth 2 (two) times daily. What changed:  when to take this  Indication:  Attention Deficit Hyperactivity Disorder   diclofenac sodium 1 % Gel Commonly known as:  VOLTAREN Apply topically 4 (four) times daily.  Indication:  Joint Damage causing Pain and Loss of Function   famotidine 20 MG tablet Commonly known as:  PEPCID Take by mouth.  Indication:  Heartburn   fluticasone 50 MCG/ACT nasal spray Commonly known as:  FLONASE SHAKE LIQUID AND USE 2 SPRAYS IN EACH NOSTRIL EVERY DAY AS NEEDED FOR RHINITIS OR ALLERGIES  Indication:  Allergic Rhinitis   gabapentin 600 MG tablet Commonly known as:  NEURONTIN Take 0.5 tablets (300 mg total) by mouth 3 (three) times daily.  Indication:  pain, anxiety, mood   hydrochlorothiazide 25 MG tablet Commonly known as:  HYDRODIURIL Take 25 mg by mouth daily.  Indication:  High Blood Pressure Disorder   hydrOXYzine 50 MG tablet Commonly known as:  ATARAX/VISTARIL Take 1  tablet (50 mg total) by mouth 3 (three) times daily as needed for anxiety.  Indication:  Feeling Anxious   ibuprofen 800 MG tablet Commonly known as:  ADVIL,MOTRIN Take 800 mg by mouth every 8 (eight) hours as needed.  Indication:  Pain   INCRUSE ELLIPTA 62.5 MCG/INH Aepb Generic drug:  umeclidinium bromide Inhale 1 puff into the lungs daily.  Indication:  Chronic Obstructive Lung Disease   levonorgestrel-ethinyl estradiol 0.15-30 MG-MCG tablet Commonly known as:  NORDETTE Take 1 tablet by mouth daily.  Indication:  Birth Control Treatment   levothyroxine 50 MCG tablet Commonly known as:  SYNTHROID, LEVOTHROID TAKE 1 TABLET(50 MCG) BY MOUTH EVERY DAY 30 TO 60 MINUTES BEFORE BREAKFAST ON AN EMPTY STOMACH AND WITH A GLASS OF WATER  Indication:  Underactive Thyroid  lisinopril 10 MG tablet Commonly known as:  PRINIVIL,ZESTRIL Take 10 mg by mouth daily.  Indication:  High Blood Pressure Disorder   methocarbamol 500 MG tablet Commonly known as:  ROBAXIN Take 1 tablet by mouth 2 (two) times daily.  Indication:  Musculoskeletal Pain   MYRBETRIQ 25 MG Tb24 tablet Generic drug:  mirabegron ER TAKE 1 TABLET BY MOUTH DAILY  Indication:  Frequent Urination   naloxegol oxalate 12.5 MG Tabs tablet Commonly known as:  MOVANTIK Take 25 mg by mouth daily.  Indication:  Opioid-Induced Constipation   norethindrone 0.35 MG tablet Commonly known as:  MICRONOR,CAMILA,ERRIN Take by mouth.  Indication:  Birth Control Treatment   omeprazole 40 MG capsule Commonly known as:  PRILOSEC Take 40 mg by mouth daily.  Indication:  Gastroesophageal Reflux Disease   oxyCODONE 5 MG immediate release tablet Commonly known as:  Oxy IR/ROXICODONE Take 5 mg by mouth every 4 (four) hours as needed.  Indication:  Chronic Pain   oxyCODONE-acetaminophen 5-325 MG tablet Commonly known as:  PERCOCET/ROXICET Take 1 tablet by mouth as needed for pain.  Indication:  Pain   pantoprazole 40 MG  tablet Commonly known as:  PROTONIX TAKE 1 TABLET DAILY 2 HOURS AFTER SYNTHROID AND 1 TABLET 30 MINUTES BEFOREDINNER  Indication:  Stomach Ulcer   polyethylene glycol powder powder Commonly known as:  GLYCOLAX/MIRALAX Take by mouth.  Indication:  Constipation   risperiDONE 2 MG tablet Commonly known as:  RISPERDAL Take 1 tablet (2 mg total) by mouth at bedtime.  Indication:  mood,anxiety   sucralfate 1 g tablet Commonly known as:  CARAFATE Take 1 tablet by mouth 4 (four) times daily.  Indication:  Ulcer of the Duodenum   tiotropium 18 MCG inhalation capsule Commonly known as:  SPIRIVA Place 18 mcg into inhaler and inhale daily.  Indication:  Chronic Obstructive Lung Disease   tiZANidine 2 MG tablet Commonly known as:  ZANAFLEX LIMIT TO 2 TO 3 TS PO PER DAY IF TOLERATED  Indication:  Lower Backache, Muscle Spasticity   tolterodine 2 MG 24 hr capsule Commonly known as:  DETROL LA TAKE ONE CAPSULE BY MOUTH ONCE DAILY  Indication:  Overactive Bladder   traMADol 50 MG tablet Commonly known as:  ULTRAM Take 1 tablet (50 mg total) by mouth 3 (three) times daily. What changed:  when to take this  Indication:  Moderate to Moderately Severe Pain   traZODone 100 MG tablet Commonly known as:  DESYREL Take 1 tablet (100 mg total) by mouth at bedtime as needed for sleep.  Indication:  Trouble Sleeping   valACYclovir 1000 MG tablet Commonly known as:  VALTREX Take 1,000 mg by mouth as needed.  Indication:  Herpes Simplex Infection   venlafaxine 37.5 MG tablet Commonly known as:  EFFEXOR Take 1 tablet (37.5 mg total) by mouth 2 (two) times daily. Also on 75 mg in the morning What changed:  You were already taking a medication with the same name, and this prescription was added. Make sure you understand how and when to take each.  Indication:  Major Depressive Disorder   venlafaxine 75 MG tablet Commonly known as:  EFFEXOR Take 1 tablet (75 mg total) by mouth every  morning. What changed:  when to take this  Indication:  Major Depressive Disorder      Follow-up Information    P[facility], [employer]. Go on 08/27/2017.   Why:  Please follow up Thursday August 27, 2017 at 9am. Please bring your ID and  insurance card. Thank you. Contact information: [address] [place] [address] [phone]        [facility] Go on 08/26/2017.   Why:  Your Peer Support Cyndi will come and meet with you once you are home today. Please give her a call when you get home. Thank you. Contact information: [address] [place] [address] [phone]           Signed: Haskell Riling, MD 08/26/2017, 9:21 AM

## 2017-09-23 ENCOUNTER — Encounter: Payer: Self-pay | Admitting: *Deleted

## 2017-09-24 ENCOUNTER — Ambulatory Visit
Admission: RE | Admit: 2017-09-24 | Discharge: 2017-09-24 | Disposition: A | Discharge status: Home / Self Care | Payer: Medicare Other | Source: Ambulatory Visit | Attending: Gastroenterology | Admitting: Gastroenterology

## 2017-09-24 ENCOUNTER — Ambulatory Visit: Payer: Medicare Other | Admitting: Anesthesiology

## 2017-09-24 ENCOUNTER — Encounter
Admission: RE | Disposition: A | Discharge status: Home / Self Care | Payer: Self-pay | Source: Ambulatory Visit | Attending: Gastroenterology

## 2017-09-24 ENCOUNTER — Other Ambulatory Visit: Payer: Self-pay | Admitting: Specialist

## 2017-09-24 DIAGNOSIS — M419 Scoliosis, unspecified: Secondary | ICD-10-CM | POA: Insufficient documentation

## 2017-09-24 DIAGNOSIS — K259 Gastric ulcer, unspecified as acute or chronic, without hemorrhage or perforation: Secondary | ICD-10-CM | POA: Insufficient documentation

## 2017-09-24 DIAGNOSIS — Z888 Allergy status to other drugs, medicaments and biological substances status: Secondary | ICD-10-CM | POA: Diagnosis not present

## 2017-09-24 DIAGNOSIS — K2960 Other gastritis without bleeding: Secondary | ICD-10-CM | POA: Diagnosis not present

## 2017-09-24 DIAGNOSIS — J439 Emphysema, unspecified: Secondary | ICD-10-CM | POA: Insufficient documentation

## 2017-09-24 DIAGNOSIS — Z7951 Long term (current) use of inhaled steroids: Secondary | ICD-10-CM | POA: Diagnosis not present

## 2017-09-24 DIAGNOSIS — K228 Other specified diseases of esophagus: Secondary | ICD-10-CM | POA: Insufficient documentation

## 2017-09-24 DIAGNOSIS — F319 Bipolar disorder, unspecified: Secondary | ICD-10-CM | POA: Insufficient documentation

## 2017-09-24 DIAGNOSIS — F603 Borderline personality disorder: Secondary | ICD-10-CM | POA: Insufficient documentation

## 2017-09-24 DIAGNOSIS — G709 Myoneural disorder, unspecified: Secondary | ICD-10-CM | POA: Diagnosis not present

## 2017-09-24 DIAGNOSIS — Z79890 Hormone replacement therapy (postmenopausal): Secondary | ICD-10-CM | POA: Diagnosis not present

## 2017-09-24 DIAGNOSIS — K210 Gastro-esophageal reflux disease with esophagitis: Secondary | ICD-10-CM | POA: Diagnosis not present

## 2017-09-24 DIAGNOSIS — G5600 Carpal tunnel syndrome, unspecified upper limb: Secondary | ICD-10-CM | POA: Diagnosis not present

## 2017-09-24 DIAGNOSIS — Z79899 Other long term (current) drug therapy: Secondary | ICD-10-CM | POA: Insufficient documentation

## 2017-09-24 DIAGNOSIS — Z87891 Personal history of nicotine dependence: Secondary | ICD-10-CM | POA: Insufficient documentation

## 2017-09-24 DIAGNOSIS — I10 Essential (primary) hypertension: Secondary | ICD-10-CM | POA: Insufficient documentation

## 2017-09-24 DIAGNOSIS — M5136 Other intervertebral disc degeneration, lumbar region: Secondary | ICD-10-CM | POA: Diagnosis not present

## 2017-09-24 DIAGNOSIS — R1310 Dysphagia, unspecified: Secondary | ICD-10-CM

## 2017-09-24 DIAGNOSIS — Z793 Long term (current) use of hormonal contraceptives: Secondary | ICD-10-CM | POA: Diagnosis not present

## 2017-09-24 DIAGNOSIS — Z791 Long term (current) use of non-steroidal anti-inflammatories (NSAID): Secondary | ICD-10-CM | POA: Diagnosis not present

## 2017-09-24 DIAGNOSIS — E039 Hypothyroidism, unspecified: Secondary | ICD-10-CM | POA: Diagnosis not present

## 2017-09-24 HISTORY — PX: ESOPHAGOGASTRODUODENOSCOPY: SHX5428

## 2017-09-24 SURGERY — EGD (ESOPHAGOGASTRODUODENOSCOPY)
Anesthesia: General

## 2017-09-24 MED ORDER — LIDOCAINE HCL (PF) 1 % IJ SOLN
INTRAMUSCULAR | Status: AC
  Filled 2017-09-24: qty 2

## 2017-09-24 MED ORDER — PROPOFOL 500 MG/50ML IV EMUL
INTRAVENOUS | Status: AC
  Filled 2017-09-24: qty 50

## 2017-09-24 MED ORDER — PROPOFOL 10 MG/ML IV BOLUS
INTRAVENOUS | Status: DC | PRN
  Administered 2017-09-24: 60 mg via INTRAVENOUS
  Administered 2017-09-24: 20 mg via INTRAVENOUS

## 2017-09-24 MED ORDER — SODIUM CHLORIDE 0.9 % IV SOLN
INTRAVENOUS | Status: DC
  Administered 2017-09-24: 11:00:00 via INTRAVENOUS

## 2017-09-24 MED ORDER — PROPOFOL 500 MG/50ML IV EMUL
INTRAVENOUS | Status: DC | PRN
  Administered 2017-09-24: 125 ug/kg/min via INTRAVENOUS

## 2017-09-24 MED ORDER — PROPOFOL 10 MG/ML IV BOLUS
INTRAVENOUS | Status: AC
  Filled 2017-09-24: qty 20

## 2017-09-24 MED ORDER — LIDOCAINE HCL (PF) 2 % IJ SOLN
INTRAMUSCULAR | Status: DC | PRN
  Administered 2017-09-24: 80 mg via INTRADERMAL

## 2017-09-24 NOTE — Anesthesia Preprocedure Evaluation (Signed)
Anesthesia Evaluation    Airway Mallampati: III  TM Distance: >3 FB Neck ROM: full    Dental  (+) Upper Dentures, Lower Dentures   Pulmonary COPD,  COPD inhaler, former smoker,    breath sounds clear to auscultation       Cardiovascular hypertension,  Rhythm:regular Rate:Normal     Neuro/Psych  Neuromuscular disease (carpal tunnel syndrome)    GI/Hepatic PUD, GERD  ,  Endo/Other  Hypothyroidism   Renal/GU      Musculoskeletal   Abdominal   Peds  Hematology  (+) anemia ,   Anesthesia Other Findings   Reproductive/Obstetrics                             Anesthesia Physical Anesthesia Plan  ASA: III  Anesthesia Plan: General   Post-op Pain Management:    Induction:   PONV Risk Score and Plan: Propofol infusion and TIVA  Airway Management Planned: Natural Airway and Nasal Cannula  Additional Equipment:   Intra-op Plan:   Post-operative Plan:   Informed Consent:   Plan Discussed with:   Anesthesia Plan Comments:         Anesthesia Quick Evaluation

## 2017-09-24 NOTE — Op Note (Signed)
[facility] Gastroenterology Patient Name: Kirsten Richardson Procedure Date: 09/24/2017 10:48 AM MRN: [id number] Account #: [id number] Date of Birth: [birth year] Admit Type: Outpatient Age: 42 Room: [facility] ENDO ROOM 1 Gender: Female Note Status: Finalized Procedure:            Upper GI endoscopy Indications:          Follow-up of gastric ulcer Providers:            Christena Deem, MD Referring MD:         Craig Guess, MD (Referring MD) Medicines:            Monitored Anesthesia Care Complications:        No immediate complications. Procedure:            Pre-Anesthesia Assessment:                       - ASA Grade Assessment: III - A patient with severe                        systemic disease.                       After obtaining informed consent, the endoscope was                        passed under direct vision. Throughout the procedure,                        the patient's blood pressure, pulse, and oxygen                        saturations were monitored continuously. The Endoscope                        was introduced through the mouth, and advanced to the                        third part of duodenum. The upper GI endoscopy was                        accomplished without difficulty. The patient tolerated                        the procedure well. Findings:      The Z-line was variable. Biopsies were taken with a cold forceps for       histology.      The exam of the esophagus was otherwise normal.      Patchy moderate inflammation characterized by congestion (edema),       erosions, erythema and linear erosions was found at the incisura and in       the gastric antrum. Biopsies were taken of erosions, edges of ulcer       sites and random antrum/body, with a cold forceps for histology.       Biopsies were taken with a cold forceps for Helicobacter pylori testing.       There is still evidence of a pyloric channel and incisura erosion/ulcer,       though  improved.      The cardia and gastric fundus were normal on retroflexion.      The examined duodenum was  normal. Impression:           - Z-line variable. Biopsied.                       - Erosive gastritis. Biopsied.                       - Normal examined duodenum. Recommendation:       - Await pathology results.                       - Use Protonix (pantoprazole) 40 mg PO daily daily.                       - Return to GI clinic in 4 weeks. Procedure Code(s):    --- Professional ---                       [phone], Esophagogastroduodenoscopy, flexible, transoral;                        with biopsy, single or multiple Diagnosis Code(s):    --- Professional ---                       K22.8, Other specified diseases of esophagus                       K29.60, Other gastritis without bleeding                       K25.9, Gastric ulcer, unspecified as acute or chronic,                        without hemorrhage or perforation CPT copyright 2017 American [employer] Association. All rights reserved. The codes documented in this report are preliminary and upon coder review may  be revised to meet current compliance requirements. Christena Deem, MD 09/24/2017 11:16:15 AM This report has been signed electronically. Number of Addenda: 0 Note Initiated On: 09/24/2017 10:48 AM      [facility]

## 2017-09-24 NOTE — Anesthesia Postprocedure Evaluation (Signed)
Anesthesia Post Note  Patient: Kirsten Richardson [facility]  Procedure(s) Performed: ESOPHAGOGASTRODUODENOSCOPY (EGD) (N/A )  Patient location during evaluation: PACU Anesthesia Type: General Level of consciousness: awake and alert Pain management: pain level controlled Vital Signs Assessment: post-procedure vital signs reviewed and stable Respiratory status: spontaneous breathing, nonlabored ventilation, respiratory function stable and patient connected to nasal cannula oxygen Cardiovascular status: blood pressure returned to baseline and stable Postop Assessment: no apparent nausea or vomiting Anesthetic complications: no     Last Vitals:  Vitals:   09/24/17 1116 09/24/17 1126  BP: (!) 146/90 (!) 90/54  Pulse: 73 72  Resp: 17 17  Temp: 36.7 C   SpO2: 99% 99%    Last Pain:  Vitals:   09/24/17 1126  TempSrc:   PainSc: 0-No pain                 Jovita Gamma

## 2017-09-24 NOTE — Anesthesia Post-op Follow-up Note (Signed)
Anesthesia QCDR form completed.        

## 2017-09-24 NOTE — Transfer of Care (Signed)
Immediate Anesthesia Transfer of Care Note  Patient: Kirsten Richardson  Procedure(s) Performed: ESOPHAGOGASTRODUODENOSCOPY (EGD) (N/A )  Patient Location: PACU  Anesthesia Type:General  Level of Consciousness: awake  Airway & Oxygen Therapy: Patient Spontanous Breathing and Patient connected to nasal cannula oxygen  Post-op Assessment: Report given to RN and Post -op Vital signs reviewed and stable  Post vital signs: Reviewed and stable  Last Vitals:  Vitals Value Taken Time  BP 90/54 09/24/2017 11:26 AM  Temp    Pulse 69 09/24/2017 11:34 AM  Resp 16 09/24/2017 11:34 AM  SpO2 100 % 09/24/2017 11:34 AM  Vitals shown include unvalidated device data.  Last Pain:  Vitals:   09/24/17 1126  TempSrc:   PainSc: 0-No pain         Complications: No apparent anesthesia complications
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
## 2017-09-25 ENCOUNTER — Encounter: Payer: Self-pay | Admitting: Gastroenterology

## 2017-09-29 LAB — SURGICAL PATHOLOGY

## 2017-09-30 ENCOUNTER — Ambulatory Visit
Admission: RE | Admit: 2017-09-30 | Discharge: 2017-09-30 | Disposition: A | Discharge status: Home / Self Care | Payer: Medicare Other | Source: Ambulatory Visit | Attending: Specialist | Admitting: Specialist

## 2017-09-30 DIAGNOSIS — R1312 Dysphagia, oropharyngeal phase: Secondary | ICD-10-CM | POA: Diagnosis not present

## 2017-09-30 DIAGNOSIS — R1310 Dysphagia, unspecified: Secondary | ICD-10-CM | POA: Diagnosis present

## 2017-09-30 DIAGNOSIS — J449 Chronic obstructive pulmonary disease, unspecified: Secondary | ICD-10-CM | POA: Diagnosis not present

## 2017-09-30 DIAGNOSIS — K219 Gastro-esophageal reflux disease without esophagitis: Secondary | ICD-10-CM | POA: Diagnosis not present
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
## 2017-10-15 ENCOUNTER — Ambulatory Visit: Payer: Medicare Other | Attending: Specialist

## 2017-10-15 DIAGNOSIS — G4733 Obstructive sleep apnea (adult) (pediatric): Secondary | ICD-10-CM | POA: Diagnosis not present

## 2017-12-19 IMAGING — MR MR LUMBAR SPINE W/O CM
5 series · 37 of 48 positions shown · non-contrast
Comparison: None.

CLINICAL DATA: Chronic low back pain, lumbar radiculopathy, worse
on the right

EXAM:
MRI LUMBAR SPINE WITHOUT CONTRAST
TECHNIQUE: Multiplanar, multisequence MR imaging of the lumbar spine was
performed. No intravenous contrast was administered.

[Series 2: T2 · sagittal · 4.0mm · 0.81mm/px · 6 of 15 slices shown (1 of 2)]
[im 1/15]
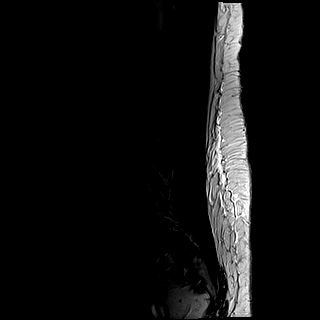
[im 3/15]
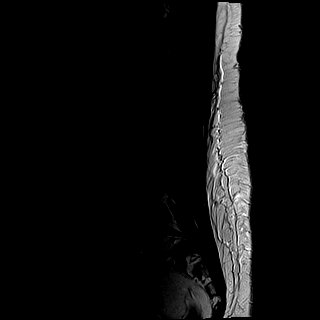
[im 6/15]
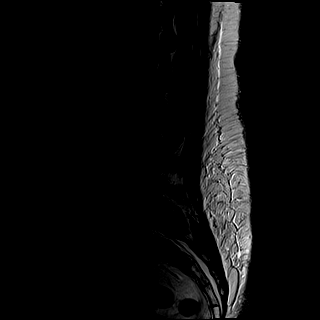
[im 9/15]
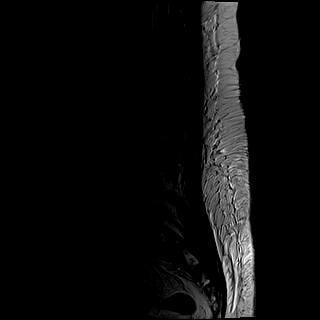
[im 12/15]
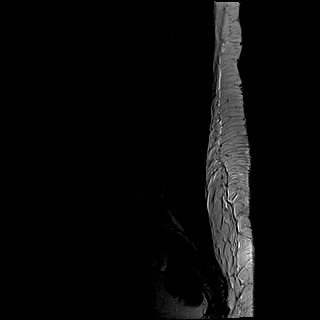
[im 15/15]
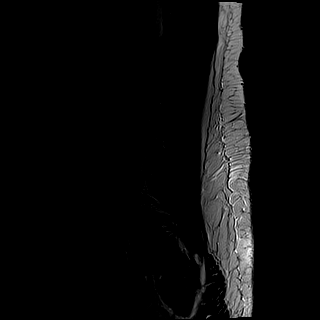

[Series 3: T1 · sagittal · 4.0mm · 0.81mm/px · 6 of 15 slices shown (1 of 2)]
[im 1/15]
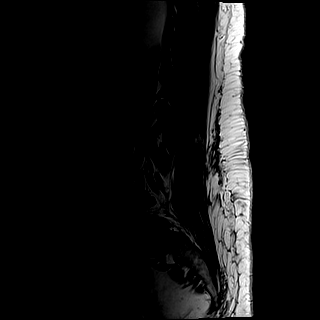
[im 3/15]
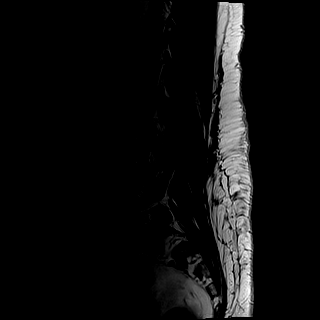
[im 6/15]
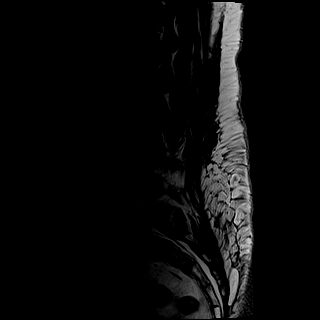
[im 9/15]
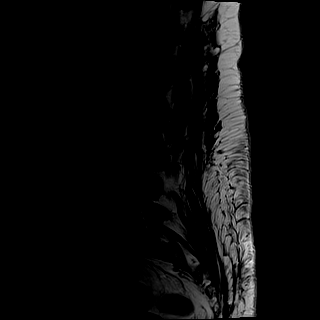
[im 12/15]
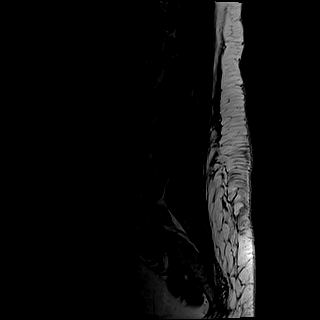
[im 15/15]
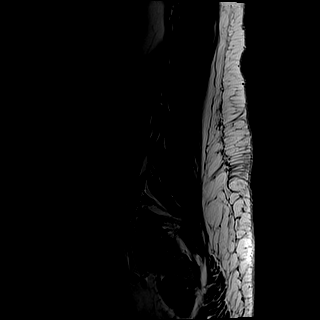

[Series 4: STIR · sagittal · 4.0mm · 1.02mm/px · 6 of 15 slices shown]
[im 1/15]
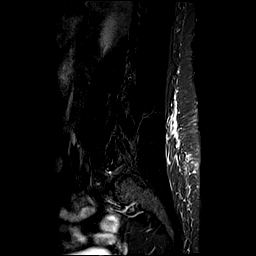
[im 3/15]
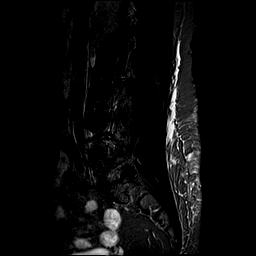
[im 6/15]
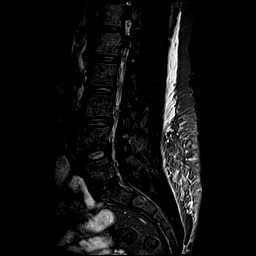
[im 9/15]
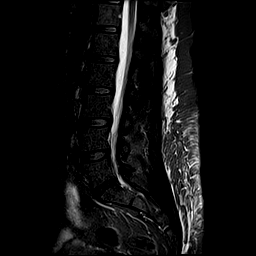
[im 12/15]
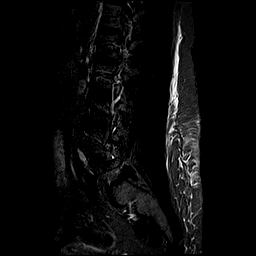
[im 15/15]
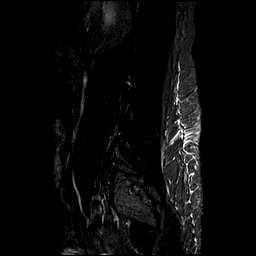

[Series 5: T2 · axial · 4.0mm · 0.78mm/px · z∈[-168,+34]mm · 10 of 37 slices shown (2 of 2)]
[im 1/37]
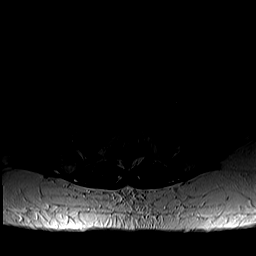
[im 3/37]
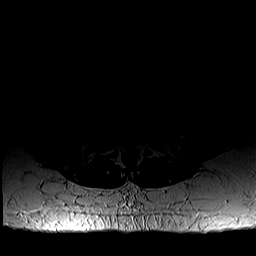
[im 6/37]
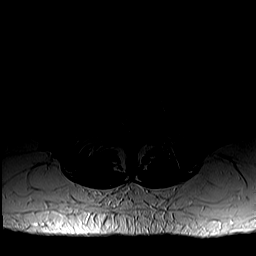
[im 11/37]
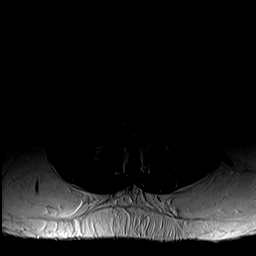
[im 16/37]
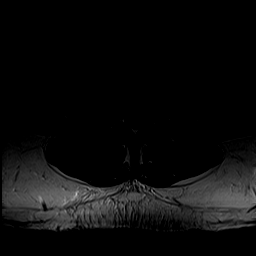
[im 19/37]
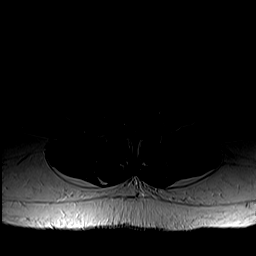
[im 21/37]
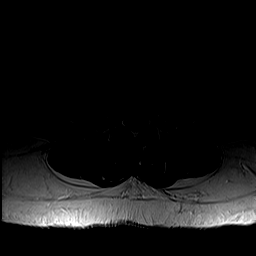
[im 26/37]
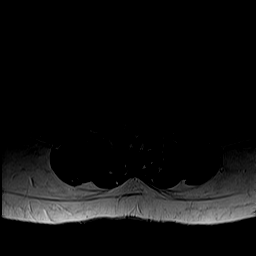
[im 31/37]
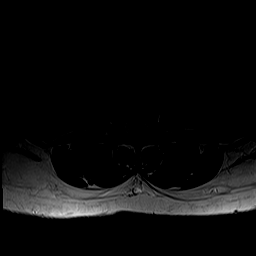
[im 37/37]
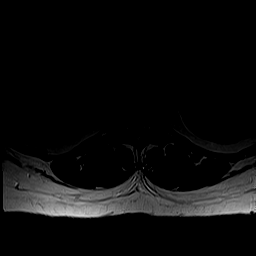

[Series 6: T1 · axial · 4.0mm · 0.39mm/px · z∈[-168,+34]mm · 9 of 37 slices shown (2 of 2)]
[im 1/37]
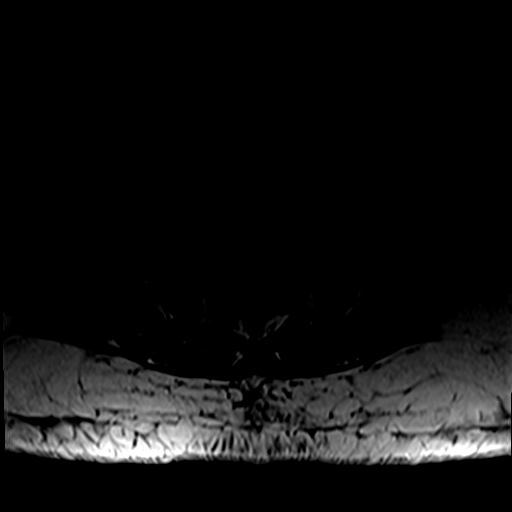
[im 6/37]
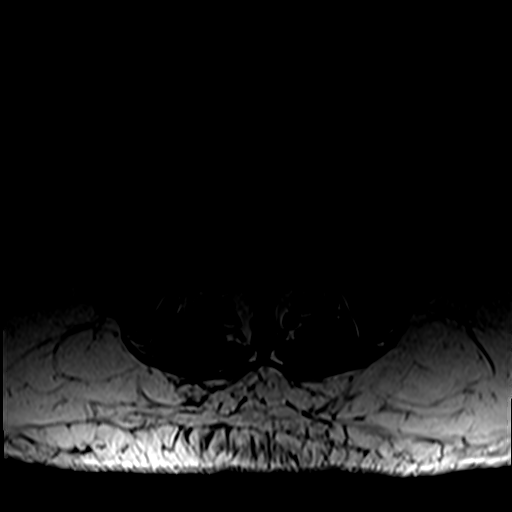
[im 11/37]
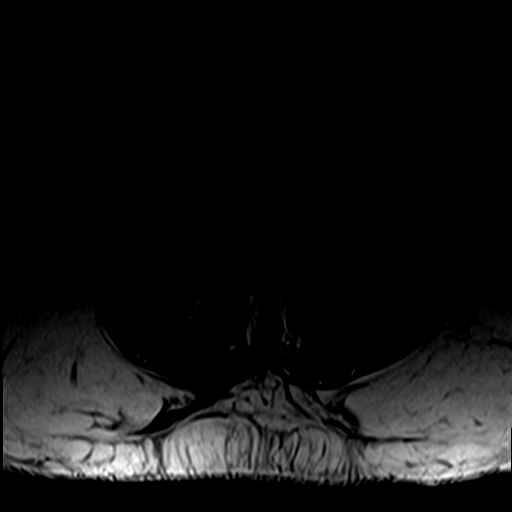
[im 16/37]
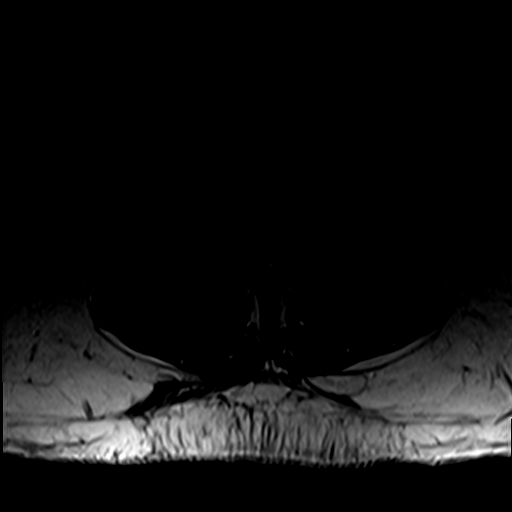
[im 19/37]
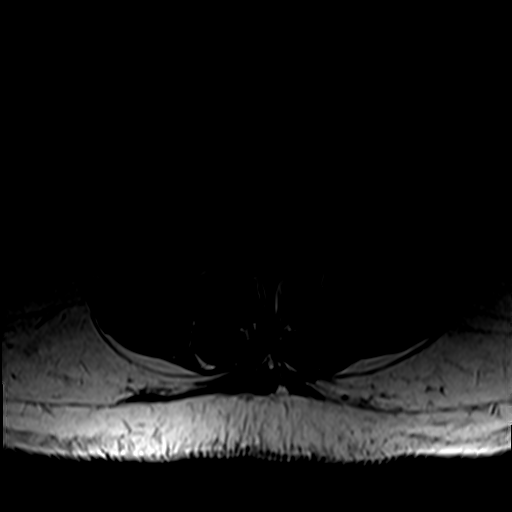
[im 21/37]
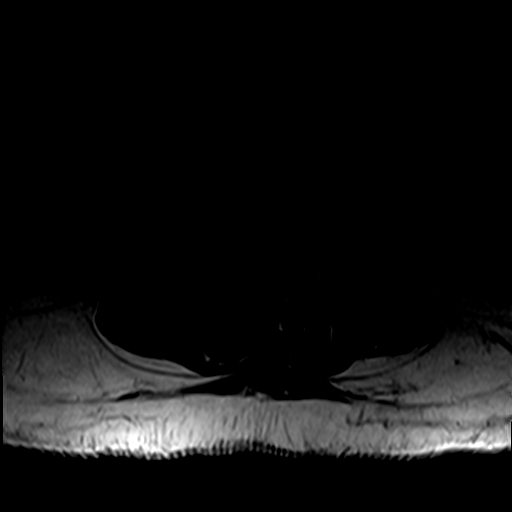
[im 26/37]
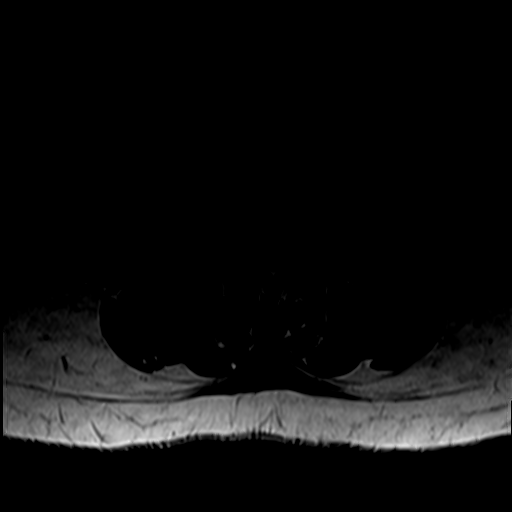
[im 31/37]
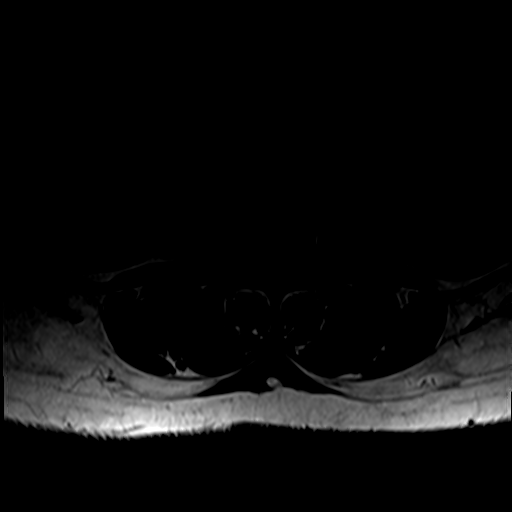
[im 37/37]
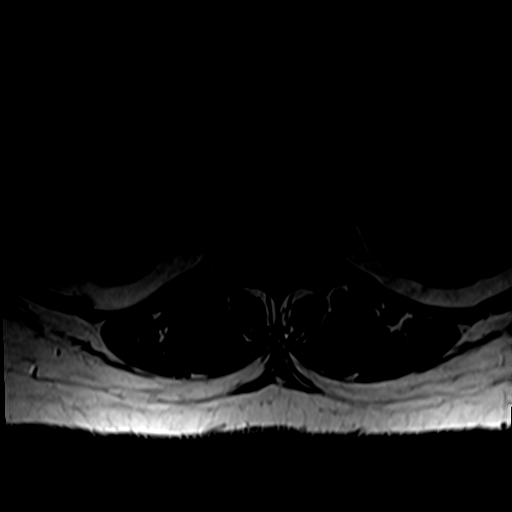

[37 of 48 positions shown; findings below may reference images not displayed]

FINDINGS: Segmentation:  Standard.

Alignment:  Physiologic.

Vertebrae:  No fracture, evidence of discitis, or bone lesion.

Conus medullaris: Extends to the T11-12 level and appears normal.

Paraspinal and other soft tissues: No paraspinal abnormality.

Disc levels:

Disc spaces: Disc spaces are preserved.  Disc desiccation at L5-S1.

T12-L1: No significant disc bulge. No evidence of neural foraminal
stenosis. No central canal stenosis.

L1-L2: No significant disc bulge. No evidence of neural foraminal
stenosis. No central canal stenosis.

L2-L3: No significant disc bulge. No evidence of neural foraminal
stenosis. No central canal stenosis.

L3-L4: No significant disc bulge. No evidence of neural foraminal
stenosis. No central canal stenosis. Mild bilateral facet
arthropathy.

L4-L5: No significant disc bulge. No evidence of neural foraminal
stenosis. No central canal stenosis. Mild bilateral facet
arthropathy.

L5-S1: Small right paracentral disc protrusion with mass effect on
the right intraspinal S1 nerve root. No evidence of neural foraminal
stenosis. No central canal stenosis. Mild bilateral facet
arthropathy.
IMPRESSION: 1. At L5-S1 there is a small right paracentral disc protrusion with
mass effect on the right intraspinal S1 nerve root.

## 2017-12-28 ENCOUNTER — Other Ambulatory Visit: Payer: Self-pay | Admitting: Gastroenterology

## 2017-12-28 ENCOUNTER — Other Ambulatory Visit (HOSPITAL_COMMUNITY): Payer: Self-pay | Admitting: Gastroenterology

## 2017-12-28 DIAGNOSIS — R1013 Epigastric pain: Secondary | ICD-10-CM

## 2017-12-28 DIAGNOSIS — R110 Nausea: Secondary | ICD-10-CM

## 2018-01-15 ENCOUNTER — Encounter
Admission: RE | Admit: 2018-01-15 | Discharge: 2018-01-15 | Disposition: A | Discharge status: Home / Self Care | Payer: Medicare Other | Source: Ambulatory Visit | Attending: Gastroenterology | Admitting: Gastroenterology

## 2018-01-15 DIAGNOSIS — R1013 Epigastric pain: Secondary | ICD-10-CM

## 2018-01-15 DIAGNOSIS — R110 Nausea: Secondary | ICD-10-CM | POA: Insufficient documentation

## 2018-01-18 ENCOUNTER — Other Ambulatory Visit: Payer: Medicare Other

## 2018-03-21 ENCOUNTER — Other Ambulatory Visit: Payer: Self-pay

## 2018-03-21 ENCOUNTER — Emergency Department
Admission: EM | Admit: 2018-03-21 | Discharge: 2018-03-21 | Disposition: A | Discharge status: Home / Self Care | Payer: Medicare Other | Attending: Student in an Organized Health Care Education/Training Program | Admitting: Student in an Organized Health Care Education/Training Program

## 2018-03-21 ENCOUNTER — Emergency Department: Payer: Medicare Other

## 2018-03-21 DIAGNOSIS — R221 Localized swelling, mass and lump, neck: Secondary | ICD-10-CM | POA: Diagnosis present

## 2018-03-21 DIAGNOSIS — J020 Streptococcal pharyngitis: Secondary | ICD-10-CM

## 2018-03-21 DIAGNOSIS — Z79899 Other long term (current) drug therapy: Secondary | ICD-10-CM | POA: Insufficient documentation

## 2018-03-21 DIAGNOSIS — E039 Hypothyroidism, unspecified: Secondary | ICD-10-CM | POA: Insufficient documentation

## 2018-03-21 DIAGNOSIS — I10 Essential (primary) hypertension: Secondary | ICD-10-CM | POA: Diagnosis not present

## 2018-03-21 DIAGNOSIS — Z87891 Personal history of nicotine dependence: Secondary | ICD-10-CM | POA: Diagnosis not present

## 2018-03-21 DIAGNOSIS — J449 Chronic obstructive pulmonary disease, unspecified: Secondary | ICD-10-CM | POA: Insufficient documentation

## 2018-03-21 LAB — CBC WITH DIFFERENTIAL/PLATELET
Abs Immature Granulocytes: 0.06 10*3/uL (ref 0.00–0.07)
Basophils Absolute: 0.1 10*3/uL (ref 0.0–0.1)
Basophils Relative: 0 %
Eosinophils Absolute: 0 10*3/uL (ref 0.0–0.5)
Eosinophils Relative: 0 %
HCT: 37.8 % (ref 36.0–46.0)
Hemoglobin: 12 g/dL (ref 12.0–15.0)
Immature Granulocytes: 1 %
Lymphocytes Relative: 15 %
Lymphs Abs: 1.9 10*3/uL (ref 0.7–4.0)
MCH: 28.4 pg (ref 26.0–34.0)
MCHC: 31.7 g/dL (ref 30.0–36.0)
MCV: 89.6 fL (ref 80.0–100.0)
Monocytes Absolute: 0.4 10*3/uL (ref 0.1–1.0)
Monocytes Relative: 3 %
Neutro Abs: 10.8 10*3/uL — ABNORMAL HIGH (ref 1.7–7.7)
Neutrophils Relative %: 81 %
Platelets: 438 10*3/uL — ABNORMAL HIGH (ref 150–400)
RBC: 4.22 MIL/uL (ref 3.87–5.11)
RDW: 17.2 % — ABNORMAL HIGH (ref 11.5–15.5)
WBC: 13.2 10*3/uL — ABNORMAL HIGH (ref 4.0–10.5)
nRBC: 0 % (ref 0.0–0.2)

## 2018-03-21 LAB — COMPREHENSIVE METABOLIC PANEL
ALT: 35 U/L (ref 0–44)
AST: 38 U/L (ref 15–41)
Albumin: 4 g/dL (ref 3.5–5.0)
Alkaline Phosphatase: 86 U/L (ref 38–126)
Anion gap: 6 (ref 5–15)
BUN: <5 mg/dL — ABNORMAL LOW (ref 6–20)
CO2: 28 mmol/L (ref 22–32)
Calcium: 9 mg/dL (ref 8.9–10.3)
Chloride: 103 mmol/L (ref 98–111)
Creatinine, Ser: 0.79 mg/dL (ref 0.44–1.00)
GFR calc Af Amer: >60 mL/min (ref >60)
GFR calc non Af Amer: >60 mL/min (ref >60)
Glucose, Bld: 101 mg/dL — ABNORMAL HIGH (ref 70–99)
Potassium: 3.6 mmol/L (ref 3.5–5.1)
Sodium: 137 mmol/L (ref 135–145)
Total Bilirubin: 0.5 mg/dL (ref 0.3–1.2)
Total Protein: 7.5 g/dL (ref 6.5–8.1)

## 2018-03-21 LAB — MONONUCLEOSIS SCREEN: Mono Screen: NEGATIVE

## 2018-03-21 LAB — LACTIC ACID, PLASMA: Lactic Acid, Venous: 0.6 mmol/L (ref 0.5–1.9)

## 2018-03-21 LAB — GROUP A STREP BY PCR: Group A Strep by PCR: DETECTED — AB

## 2018-03-21 MED ORDER — MAGIC MOUTHWASH W/LIDOCAINE: 5.0000 mL | Freq: Four times a day (QID) | ORAL | 0 refills | Status: DC

## 2018-03-21 MED ORDER — DEXAMETHASONE SODIUM PHOSPHATE 10 MG/ML IJ SOLN
10.0000 mg | Freq: Once | INTRAMUSCULAR | Status: AC
  Administered 2018-03-21: 10 mg via INTRAVENOUS
  Filled 2018-03-21: qty 1

## 2018-03-21 MED ORDER — LIDOCAINE VISCOUS HCL 2 % MT SOLN
15.0000 mL | Freq: Once | OROMUCOSAL | Status: AC
  Administered 2018-03-21: 15 mL via OROMUCOSAL
  Filled 2018-03-21: qty 15

## 2018-03-21 MED ORDER — ONDANSETRON HCL 4 MG/2ML IJ SOLN
4.0000 mg | Freq: Once | INTRAMUSCULAR | Status: AC
  Administered 2018-03-21: 4 mg via INTRAVENOUS
  Filled 2018-03-21: qty 2

## 2018-03-21 MED ORDER — AMOXICILLIN 875 MG PO TABS: 875.0000 mg | ORAL_TABLET | Freq: Two times a day (BID) | ORAL | 0 refills | Status: DC

## 2018-03-21 MED ORDER — SODIUM CHLORIDE 0.9 % IV SOLN
1.0000 g | Freq: Once | INTRAVENOUS | Status: AC
  Administered 2018-03-21: 1 g via INTRAVENOUS
  Filled 2018-03-21: qty 10

## 2018-03-21 MED ORDER — SODIUM CHLORIDE 0.9 % IV BOLUS
1000.0000 mL | Freq: Once | INTRAVENOUS | Status: AC
  Administered 2018-03-21: 1000 mL via INTRAVENOUS

## 2018-03-21 MED ORDER — CLINDAMYCIN HCL 300 MG PO CAPS: 300.0000 mg | ORAL_CAPSULE | Freq: Four times a day (QID) | ORAL | 0 refills | Status: DC

## 2018-03-21 MED ORDER — PREDNISONE 50 MG PO TABS: 50.0000 mg | ORAL_TABLET | Freq: Every day | ORAL | 0 refills | Status: DC

## 2018-03-21 MED ORDER — IOHEXOL 300 MG/ML  SOLN
75.0000 mL | Freq: Once | INTRAMUSCULAR | Status: AC | PRN
  Administered 2018-03-21: 75 mL via INTRAVENOUS
  Filled 2018-03-21: qty 75

## 2018-03-21 MED ORDER — CLINDAMYCIN HCL 150 MG PO CAPS
300.0000 mg | ORAL_CAPSULE | Freq: Once | ORAL | Status: AC
  Administered 2018-03-21: 300 mg via ORAL
  Filled 2018-03-21: qty 2

## 2018-03-21 NOTE — ED Triage Notes (Signed)
Pt states she received an injection on Friday in stomach. First time getting this injection.  Pain in throat. States neck swelling bilaterally per pt.

## 2018-03-21 NOTE — ED Notes (Signed)
Pain in neck started Friday evening and today neck swelling began. Reports symptoms kept progressing. Unlabored breathing. Patient states she can swallow but painful. Also reports she hasnt been able to eat in two days
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
## 2018-03-26 LAB — CULTURE, BLOOD (ROUTINE X 2)
Culture: NO GROWTH
Culture: NO GROWTH

## 2018-04-07 ENCOUNTER — Emergency Department
Admission: EM | Admit: 2018-04-07 | Discharge: 2018-04-07 | Disposition: A | Discharge status: Home / Self Care | Payer: Medicare Other | Attending: Emergency Medicine | Admitting: Emergency Medicine

## 2018-04-07 ENCOUNTER — Encounter: Payer: Self-pay | Admitting: Emergency Medicine

## 2018-04-07 ENCOUNTER — Other Ambulatory Visit
Admission: RE | Admit: 2018-04-07 | Discharge: 2018-04-07 | Disposition: A | Discharge status: Home / Self Care | Payer: Medicare Other | Source: Ambulatory Visit | Attending: Gastroenterology | Admitting: Gastroenterology

## 2018-04-07 ENCOUNTER — Other Ambulatory Visit: Payer: Self-pay

## 2018-04-07 DIAGNOSIS — R194 Change in bowel habit: Secondary | ICD-10-CM | POA: Insufficient documentation

## 2018-04-07 DIAGNOSIS — R197 Diarrhea, unspecified: Secondary | ICD-10-CM

## 2018-04-07 DIAGNOSIS — Z79899 Other long term (current) drug therapy: Secondary | ICD-10-CM | POA: Insufficient documentation

## 2018-04-07 DIAGNOSIS — E039 Hypothyroidism, unspecified: Secondary | ICD-10-CM | POA: Insufficient documentation

## 2018-04-07 DIAGNOSIS — I10 Essential (primary) hypertension: Secondary | ICD-10-CM | POA: Insufficient documentation

## 2018-04-07 DIAGNOSIS — Z87891 Personal history of nicotine dependence: Secondary | ICD-10-CM | POA: Insufficient documentation

## 2018-04-07 DIAGNOSIS — R300 Dysuria: Secondary | ICD-10-CM | POA: Insufficient documentation

## 2018-04-07 DIAGNOSIS — J449 Chronic obstructive pulmonary disease, unspecified: Secondary | ICD-10-CM | POA: Diagnosis not present

## 2018-04-07 LAB — GASTROINTESTINAL PANEL BY PCR, STOOL (REPLACES STOOL CULTURE)

## 2018-04-07 LAB — URINALYSIS, COMPLETE (UACMP) WITH MICROSCOPIC
Bacteria, UA: NONE SEEN
Bilirubin Urine: NEGATIVE
Glucose, UA: NEGATIVE mg/dL
Hgb urine dipstick: NEGATIVE
Ketones, ur: NEGATIVE mg/dL
Leukocytes,Ua: NEGATIVE
Nitrite: NEGATIVE
Protein, ur: NEGATIVE mg/dL
Specific Gravity, Urine: 1.002 — ABNORMAL LOW (ref 1.005–1.030)
pH: 7 (ref 5.0–8.0)

## 2018-04-07 LAB — C DIFFICILE QUICK SCREEN W PCR REFLEX
C Diff antigen: NEGATIVE
C Diff interpretation: NOT DETECTED
C Diff toxin: NEGATIVE

## 2018-04-07 NOTE — Discharge Instructions (Addendum)
Continue take your Azo, drink cranberry juice, increase her water intake, and cut back on cheese as this will help with the pressure you are feeling in the lower area.  Return if you are worsening.

## 2018-04-07 NOTE — ED Triage Notes (Signed)
Pt in via POV, reports dysuria and urinary urgency x a few days.  Reports taking AZO.  Ambulatory to triage, NAD noted.
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
## 2018-04-14 DIAGNOSIS — F3181 Bipolar II disorder: Secondary | ICD-10-CM | POA: Diagnosis not present

## 2018-05-26 ENCOUNTER — Other Ambulatory Visit: Payer: Self-pay

## 2018-05-26 ENCOUNTER — Encounter: Payer: Self-pay | Admitting: Emergency Medicine

## 2018-05-26 ENCOUNTER — Emergency Department
Admission: EM | Admit: 2018-05-26 | Discharge: 2018-05-27 | Disposition: A | Discharge status: Other Acute Inpatient Hospital | Payer: Medicare Other | Attending: Emergency Medicine | Admitting: Emergency Medicine

## 2018-05-26 DIAGNOSIS — Z87891 Personal history of nicotine dependence: Secondary | ICD-10-CM | POA: Insufficient documentation

## 2018-05-26 DIAGNOSIS — Z79899 Other long term (current) drug therapy: Secondary | ICD-10-CM | POA: Insufficient documentation

## 2018-05-26 DIAGNOSIS — F1490 Cocaine use, unspecified, uncomplicated: Secondary | ICD-10-CM | POA: Insufficient documentation

## 2018-05-26 DIAGNOSIS — J449 Chronic obstructive pulmonary disease, unspecified: Secondary | ICD-10-CM | POA: Insufficient documentation

## 2018-05-26 DIAGNOSIS — R45851 Suicidal ideations: Secondary | ICD-10-CM

## 2018-05-26 DIAGNOSIS — Z046 Encounter for general psychiatric examination, requested by authority: Secondary | ICD-10-CM | POA: Insufficient documentation

## 2018-05-26 DIAGNOSIS — Z1159 Encounter for screening for other viral diseases: Secondary | ICD-10-CM | POA: Insufficient documentation

## 2018-05-26 DIAGNOSIS — R300 Dysuria: Secondary | ICD-10-CM | POA: Insufficient documentation

## 2018-05-26 DIAGNOSIS — I10 Essential (primary) hypertension: Secondary | ICD-10-CM | POA: Insufficient documentation

## 2018-05-26 DIAGNOSIS — F329 Major depressive disorder, single episode, unspecified: Secondary | ICD-10-CM | POA: Insufficient documentation

## 2018-05-26 DIAGNOSIS — R3989 Other symptoms and signs involving the genitourinary system: Secondary | ICD-10-CM | POA: Insufficient documentation

## 2018-05-26 DIAGNOSIS — R440 Auditory hallucinations: Secondary | ICD-10-CM | POA: Insufficient documentation

## 2018-05-26 DIAGNOSIS — E039 Hypothyroidism, unspecified: Secondary | ICD-10-CM | POA: Insufficient documentation

## 2018-05-26 DIAGNOSIS — R441 Visual hallucinations: Secondary | ICD-10-CM | POA: Insufficient documentation

## 2018-05-26 LAB — COMPREHENSIVE METABOLIC PANEL
ALT: 32 U/L (ref 0–44)
AST: 24 U/L (ref 15–41)
Albumin: 4.6 g/dL (ref 3.5–5.0)
Alkaline Phosphatase: 90 U/L (ref 38–126)
Anion gap: 12 (ref 5–15)
BUN: 7 mg/dL (ref 6–20)
CO2: 26 mmol/L (ref 22–32)
Calcium: 9.3 mg/dL (ref 8.9–10.3)
Chloride: 97 mmol/L — ABNORMAL LOW (ref 98–111)
Creatinine, Ser: 0.96 mg/dL (ref 0.44–1.00)
GFR calc Af Amer: >60 mL/min (ref >60)
GFR calc non Af Amer: >60 mL/min (ref >60)
Glucose, Bld: 91 mg/dL (ref 70–99)
Potassium: 3.2 mmol/L — ABNORMAL LOW (ref 3.5–5.1)
Sodium: 135 mmol/L (ref 135–145)
Total Bilirubin: 0.7 mg/dL (ref 0.3–1.2)
Total Protein: 7.6 g/dL (ref 6.5–8.1)

## 2018-05-26 LAB — ACETAMINOPHEN LEVEL: Acetaminophen (Tylenol), Serum: <10 ug/mL — ABNORMAL LOW (ref 10–30)

## 2018-05-26 LAB — URINE DRUG SCREEN, QUALITATIVE (ARMC ONLY)
Amphetamines, Ur Screen: NOT DETECTED
Barbiturates, Ur Screen: NOT DETECTED
Benzodiazepine, Ur Scrn: NOT DETECTED
Cannabinoid 50 Ng, Ur ~~LOC~~: NOT DETECTED
Cocaine Metabolite,Ur ~~LOC~~: NOT DETECTED
MDMA (Ecstasy)Ur Screen: NOT DETECTED
Methadone Scn, Ur: NOT DETECTED
Opiate, Ur Screen: NOT DETECTED
Phencyclidine (PCP) Ur S: NOT DETECTED
Tricyclic, Ur Screen: NOT DETECTED

## 2018-05-26 LAB — CBC
HCT: 40.1 % (ref 36.0–46.0)
Hemoglobin: 12.8 g/dL (ref 12.0–15.0)
MCH: 29 pg (ref 26.0–34.0)
MCHC: 31.9 g/dL (ref 30.0–36.0)
MCV: 90.7 fL (ref 80.0–100.0)
Platelets: 469 10*3/uL — ABNORMAL HIGH (ref 150–400)
RBC: 4.42 MIL/uL (ref 3.87–5.11)
RDW: 19 % — ABNORMAL HIGH (ref 11.5–15.5)
WBC: 13.5 10*3/uL — ABNORMAL HIGH (ref 4.0–10.5)
nRBC: 0.2 % (ref 0.0–0.2)

## 2018-05-26 LAB — ETHANOL: Alcohol, Ethyl (B): <10 mg/dL (ref <10)

## 2018-05-26 LAB — SALICYLATE LEVEL: Salicylate Lvl: <7 mg/dL (ref 2.8–30.0)

## 2018-05-26 LAB — POCT PREGNANCY, URINE: Preg Test, Ur: NEGATIVE

## 2018-05-26 MED ORDER — NICOTINE 21 MG/24HR TD PT24
21.0000 mg | MEDICATED_PATCH | Freq: Once | TRANSDERMAL | Status: DC
  Administered 2018-05-26: 22:00:00 21 mg via TRANSDERMAL
  Filled 2018-05-26: qty 1

## 2018-05-26 NOTE — ED Notes (Signed)
Np is evaluating Patient, , Patient is calm and cooperative.

## 2018-05-26 NOTE — ED Notes (Signed)
Pt ambulated to the restroom at this time with no assistance

## 2018-05-26 NOTE — ED Notes (Signed)
Pt dressed out  By Clinical research associate.   Pt belongings:  Pink crocs, sweat pants, pink shirt, black bra, underwear, gold colored ring, gold and silvered colored bracelet, black purse, wallet $15 cash counted by pt and Clinical research associate, id, EBT and insurance cards, book and hair clip, and cell phone which was powered off by pt.   Pt did keep her glasses.
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
## 2018-05-26 NOTE — ED Notes (Signed)
Patient complaining of burning on urination, and pain in bladder, states " I know I have a UTI, nurse did let Dr. Scotty Court know and He states he is going look at her chart and make decisions, because she had been on ABT prior to coming to hospital, will continue to monitor.

## 2018-05-26 NOTE — ED Triage Notes (Signed)
Patient presents to the ED reporting suicidal ideation and wanting to stop using "crack cocaine."  Patient is tearful during triage.  Patient present to the ED after a friend dropped her off.  Patient states, "I think my mental health meds would be okay if I could get off the drugs."  Patient states she constantly has suicidal thoughts.  When asked about a plan patient states, "just leaving this world."

## 2018-05-26 NOTE — ED Notes (Signed)
Patient's visitor dropped by to check on her.  He states patient came over to check on patient and she had a lot of pills she was going to take.  Visitor states, "I know she was going to take those pills if I hadn't come when I did."
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
## 2018-05-27 ENCOUNTER — Inpatient Hospital Stay
Admit: 2018-05-27 | Discharge: 2018-06-01 | DRG: 885 | Disposition: A | Discharge status: Home / Self Care | Payer: Medicare Other | Source: Intra-hospital | Attending: Psychiatry | Admitting: Psychiatry

## 2018-05-27 DIAGNOSIS — R45851 Suicidal ideations: Secondary | ICD-10-CM | POA: Diagnosis present

## 2018-05-27 DIAGNOSIS — K279 Peptic ulcer, site unspecified, unspecified as acute or chronic, without hemorrhage or perforation: Secondary | ICD-10-CM | POA: Diagnosis present

## 2018-05-27 DIAGNOSIS — J449 Chronic obstructive pulmonary disease, unspecified: Secondary | ICD-10-CM | POA: Diagnosis present

## 2018-05-27 DIAGNOSIS — G4700 Insomnia, unspecified: Secondary | ICD-10-CM | POA: Diagnosis present

## 2018-05-27 DIAGNOSIS — Z1159 Encounter for screening for other viral diseases: Secondary | ICD-10-CM

## 2018-05-27 DIAGNOSIS — F32A Depression, unspecified: Secondary | ICD-10-CM

## 2018-05-27 DIAGNOSIS — Z79891 Long term (current) use of opiate analgesic: Secondary | ICD-10-CM

## 2018-05-27 DIAGNOSIS — M5136 Other intervertebral disc degeneration, lumbar region: Secondary | ICD-10-CM | POA: Diagnosis present

## 2018-05-27 DIAGNOSIS — E034 Atrophy of thyroid (acquired): Secondary | ICD-10-CM | POA: Diagnosis present

## 2018-05-27 DIAGNOSIS — I10 Essential (primary) hypertension: Secondary | ICD-10-CM | POA: Diagnosis present

## 2018-05-27 DIAGNOSIS — G8929 Other chronic pain: Secondary | ICD-10-CM | POA: Diagnosis present

## 2018-05-27 DIAGNOSIS — F603 Borderline personality disorder: Secondary | ICD-10-CM | POA: Diagnosis present

## 2018-05-27 DIAGNOSIS — Z87891 Personal history of nicotine dependence: Secondary | ICD-10-CM | POA: Diagnosis not present

## 2018-05-27 DIAGNOSIS — Z7289 Other problems related to lifestyle: Secondary | ICD-10-CM | POA: Diagnosis not present

## 2018-05-27 DIAGNOSIS — F1410 Cocaine abuse, uncomplicated: Secondary | ICD-10-CM

## 2018-05-27 DIAGNOSIS — F3181 Bipolar II disorder: Secondary | ICD-10-CM | POA: Diagnosis present

## 2018-05-27 DIAGNOSIS — F329 Major depressive disorder, single episode, unspecified: Secondary | ICD-10-CM

## 2018-05-27 LAB — LIPID PANEL
Cholesterol: 237 mg/dL — ABNORMAL HIGH (ref 0–200)
HDL: 59 mg/dL (ref >40)
LDL Cholesterol: 110 mg/dL — ABNORMAL HIGH (ref 0–99)
Total CHOL/HDL Ratio: 4 RATIO
Triglycerides: 342 mg/dL — ABNORMAL HIGH (ref <150)
VLDL: 68 mg/dL — ABNORMAL HIGH (ref 0–40)

## 2018-05-27 LAB — SARS CORONAVIRUS 2 BY RT PCR (HOSPITAL ORDER, PERFORMED IN ~~LOC~~ HOSPITAL LAB): SARS Coronavirus 2: NEGATIVE

## 2018-05-27 LAB — TSH: TSH: 0.623 u[IU]/mL (ref 0.350–4.500)

## 2018-05-27 MED ORDER — PANTOPRAZOLE SODIUM 40 MG PO TBEC
40.0000 mg | DELAYED_RELEASE_TABLET | Freq: Every day | ORAL | Status: DC
  Administered 2018-05-27 – 2018-06-01 (×6): 40 mg via ORAL
  Filled 2018-05-27 (×6): qty 1

## 2018-05-27 MED ORDER — ALBUTEROL SULFATE HFA 108 (90 BASE) MCG/ACT IN AERS
1.0000 | INHALATION_SPRAY | RESPIRATORY_TRACT | Status: DC | PRN
  Filled 2018-05-27: qty 6.7

## 2018-05-27 MED ORDER — TRAZODONE HCL 100 MG PO TABS
100.0000 mg | ORAL_TABLET | Freq: Every evening | ORAL | Status: DC | PRN
  Administered 2018-05-27 – 2018-05-31 (×2): 100 mg via ORAL
  Filled 2018-05-27 (×2): qty 1

## 2018-05-27 MED ORDER — FESOTERODINE FUMARATE ER 4 MG PO TB24
4.0000 mg | ORAL_TABLET | Freq: Every day | ORAL | Status: DC
  Administered 2018-05-27 – 2018-06-01 (×6): 4 mg via ORAL
  Filled 2018-05-27 (×6): qty 1

## 2018-05-27 MED ORDER — METHOCARBAMOL 750 MG PO TABS
750.0000 mg | ORAL_TABLET | Freq: Four times a day (QID) | ORAL | Status: DC
  Administered 2018-05-27 – 2018-06-01 (×20): 750 mg via ORAL
  Filled 2018-05-27 (×21): qty 1

## 2018-05-27 MED ORDER — MOMETASONE FURO-FORMOTEROL FUM 200-5 MCG/ACT IN AERO
2.0000 | INHALATION_SPRAY | Freq: Two times a day (BID) | RESPIRATORY_TRACT | Status: DC
  Administered 2018-05-27 – 2018-06-01 (×10): 2 via RESPIRATORY_TRACT
  Filled 2018-05-27: qty 8.8

## 2018-05-27 MED ORDER — LORATADINE 10 MG PO TABS
10.0000 mg | ORAL_TABLET | Freq: Every day | ORAL | Status: DC
  Administered 2018-05-27 – 2018-06-01 (×6): 10 mg via ORAL
  Filled 2018-05-27 (×6): qty 1

## 2018-05-27 MED ORDER — ALUM & MAG HYDROXIDE-SIMETH 200-200-20 MG/5ML PO SUSP: 30.0000 mL | ORAL | Status: DC | PRN

## 2018-05-27 MED ORDER — ONDANSETRON HCL 4 MG PO TABS
4.0000 mg | ORAL_TABLET | Freq: Three times a day (TID) | ORAL | Status: DC | PRN
  Filled 2018-05-27: qty 1

## 2018-05-27 MED ORDER — VENLAFAXINE HCL ER 75 MG PO CP24
75.0000 mg | ORAL_CAPSULE | Freq: Every day | ORAL | Status: DC
  Administered 2018-05-28 – 2018-06-01 (×5): 75 mg via ORAL
  Filled 2018-05-27 (×5): qty 1

## 2018-05-27 MED ORDER — OXYCODONE HCL 5 MG PO TABS
2.5000 mg | ORAL_TABLET | ORAL | Status: DC | PRN
  Administered 2018-05-27 – 2018-06-01 (×14): 2.5 mg via ORAL
  Filled 2018-05-27 (×16): qty 1

## 2018-05-27 MED ORDER — IBUPROFEN 600 MG PO TABS: 800.0000 mg | ORAL_TABLET | Freq: Three times a day (TID) | ORAL | Status: DC | PRN

## 2018-05-27 MED ORDER — NICOTINE 21 MG/24HR TD PT24: 21.0000 mg | MEDICATED_PATCH | Freq: Once | TRANSDERMAL | Status: DC

## 2018-05-27 MED ORDER — HYDROCHLOROTHIAZIDE 25 MG PO TABS
25.0000 mg | ORAL_TABLET | Freq: Every day | ORAL | Status: DC
  Administered 2018-05-27 – 2018-06-01 (×6): 25 mg via ORAL
  Filled 2018-05-27 (×6): qty 1

## 2018-05-27 MED ORDER — PREDNISONE 50 MG PO TABS
50.0000 mg | ORAL_TABLET | Freq: Every day | ORAL | Status: DC
  Administered 2018-05-27 – 2018-05-28 (×2): 50 mg via ORAL
  Filled 2018-05-27 (×2): qty 1

## 2018-05-27 MED ORDER — VENLAFAXINE HCL 37.5 MG PO TABS
37.5000 mg | ORAL_TABLET | Freq: Two times a day (BID) | ORAL | Status: DC
  Administered 2018-05-27 – 2018-06-01 (×11): 37.5 mg via ORAL
  Filled 2018-05-27 (×11): qty 1

## 2018-05-27 MED ORDER — ACETAMINOPHEN 325 MG PO TABS: 650.0000 mg | ORAL_TABLET | Freq: Four times a day (QID) | ORAL | Status: DC | PRN

## 2018-05-27 MED ORDER — HYDROXYZINE HCL 50 MG PO TABS: 50.0000 mg | ORAL_TABLET | Freq: Three times a day (TID) | ORAL | Status: DC | PRN

## 2018-05-27 MED ORDER — BUPROPION HCL ER (XL) 150 MG PO TB24
300.0000 mg | ORAL_TABLET | Freq: Every day | ORAL | Status: DC
  Administered 2018-05-27 – 2018-06-01 (×6): 300 mg via ORAL
  Filled 2018-05-27 (×6): qty 2

## 2018-05-27 MED ORDER — MAGNESIUM HYDROXIDE 400 MG/5ML PO SUSP: 30.0000 mL | Freq: Every day | ORAL | Status: DC | PRN

## 2018-05-27 MED ORDER — GABAPENTIN 300 MG PO CAPS
300.0000 mg | ORAL_CAPSULE | Freq: Three times a day (TID) | ORAL | Status: DC
  Administered 2018-05-27 – 2018-06-01 (×15): 300 mg via ORAL
  Filled 2018-05-27 (×15): qty 1

## 2018-05-27 MED ORDER — LEVOTHYROXINE SODIUM 50 MCG PO TABS
50.0000 ug | ORAL_TABLET | Freq: Every day | ORAL | Status: DC
  Administered 2018-05-27 – 2018-06-01 (×6): 50 ug via ORAL
  Filled 2018-05-27 (×6): qty 1

## 2018-05-27 MED ORDER — FAMOTIDINE 20 MG PO TABS
20.0000 mg | ORAL_TABLET | Freq: Every day | ORAL | Status: DC
  Administered 2018-05-27 – 2018-05-31 (×5): 20 mg via ORAL
  Filled 2018-05-27 (×5): qty 1

## 2018-05-27 MED ORDER — RISPERIDONE 1 MG PO TABS
2.0000 mg | ORAL_TABLET | Freq: Every day | ORAL | Status: DC
  Administered 2018-05-27 – 2018-05-31 (×5): 2 mg via ORAL
  Filled 2018-05-27 (×5): qty 2

## 2018-05-27 NOTE — [facility] Suicide Risk Assessment (Signed)
[facility] INPATIENT:  Family/Significant Other Suicide Prevention Education  Suicide Prevention Education:  Contact Attempts: Dellis Filbert, ex boyfriend [phone] has been identified by the patient as the family member/significant other with whom the patient will be residing, and identified as the person(s) who will aid the patient in the event of a mental health crisis.  With written consent from the patient, two attempts were made to provide suicide prevention education, prior to and/or following the patient's discharge.  We were unsuccessful in providing suicide prevention education.  A suicide education pamphlet was given to the patient to share with family/significant other.  Date and time of first attempt: 05/27/2018 at 11:36AM Date and time of second attempt:  CSW left a HIPAA compliant voicemail requesting a call back.  Charlann Lange Stephanny Tsutsui MSW LCSW 05/27/2018, 11:36 AM

## 2018-05-27 NOTE — [facility] Suicide Risk Assessment (Signed)
[facility] INPATIENT:  Family/Significant Other Suicide Prevention Education  Suicide Prevention Education:  Education Completed; Dellis Filbert, ex boyfriend [phone] has been identified by the patient as the family member/significant other with whom the patient will be residing, and identified as the person(s) who will aid the patient in the event of a mental health crisis (suicidal ideations/suicide attempt).  With written consent from the patient, the family member/significant other has been provided the following suicide prevention education, prior to the and/or following the discharge of the patient.  The suicide prevention education provided includes the following:  Suicide risk factors  Suicide prevention and interventions  National Suicide Hotline telephone number  [facility] assessment telephone number  [facility] Emergency Assistance 911  [facility] and/or Residential Mobile Crisis Unit telephone number  Request made of family/significant other to:  Remove weapons (e.g., guns, rifles, knives), all items previously/currently identified as safety concern.    Remove drugs/medications (over-the-counter, prescriptions, illicit drugs), all items previously/currently identified as a safety concern.  The family member/significant other verbalizes understanding of the suicide prevention education information provided.  The family member/significant other agrees to remove the items of safety concern listed above.  Loraine Leriche reported that pt is in the hospital because she has been smoking crack cocaine and she was going to kill herself. Loraine Leriche reported that pt spent her stimulus check and her disability check on crack cocaine and he had to help her pay her car insurance and car note payments. Per Loraine Leriche, pt bought an $800 tv and sold it for $300 for crack cocaine. He denied guns/weapons being in the home and reported that pt reported she is leaving the hospital and going to rehab.  When asked if pt could return to live with him if she did not get into rehab, he reported "I guess". Loraine Leriche express concerns for SI as pt reported to him she wanted to kill herself prior to coming to the hospital.   Charlann Lange [facility] MSW LCSW 05/27/2018, 2:17 PM

## 2018-05-27 NOTE — Plan of Care (Signed)
Patient is alert and oriented X 3, denies SI and HI. Patient positive for auditory hallucinations and visual hallucinations. Patient states, " I always hear voices but they are not mean, they don't make me do anything." Patient states she has nerve pain and also takes gabapentin usually. Patient is pleasant, states trouble sleeping during the night. Patient is eating well. Patient will continue to be monitored Q 15 minutes. Problem: Education: Goal: Knowledge of [facility] Education information/materials will improve Outcome: Progressing Goal: Emotional status will improve Outcome: Progressing Goal: Mental status will improve Outcome: Progressing Goal: Verbalization of understanding the information provided will improve Outcome: Progressing   Problem: Safety: Goal: Periods of time without injury will increase Outcome: Progressing   Problem: Self-Concept: Goal: Ability to disclose and discuss suicidal ideas will improve Outcome: Progressing Goal: Will verbalize positive feelings about self Outcome: Progressing   Problem: Education: Goal: Ability to make informed decisions regarding treatment will improve Outcome: Progressing
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
## 2018-05-27 NOTE — Progress Notes (Signed)
Recreation Therapy Notes  INPATIENT RECREATION THERAPY ASSESSMENT  Patient Details Name: Kirsten Richardson MRN: [id number] DOB: September 27, 1975 Today's Date: 05/27/2018       Information Obtained From: Patient  Able to Participate in Assessment/Interview: Yes  Patient Presentation: Responsive  Reason for Admission (Per Patient): Active Symptoms, Other (Comments)(Depression)  Patient Stressors:    Coping Skills:   Doctor, hospital, Read  Leisure Interests (2+):  Individual - Reading(Praying)  Frequency of Recreation/Participation: Monthly  Awareness of Community Resources:  Yes  Community Resources:  Church  Current Use: No  If no, Barriers?: Other (Comment)(COVID 19)  Expressed Interest in [employer] Information:    [place] of Residence:  Film/video editor  Patient Main Form of Transportation: Set designer  Patient Strengths:  Outgoing  Patient Identified Areas of Improvement:  More coping skills  Patient Goal for Hospitalization:  Not be so depressed  Current SI (including self-harm):  No  Current HI:  No  Current AVH: Yes(Seeing and hearing things)  Staff Intervention Plan: Group Attendance, Collaborate with Interdisciplinary Treatment Team  Consent to Intern Participation: N/A  Annette Liotta 05/27/2018, 1:54 PM

## 2018-05-27 NOTE — [facility] Group Notes (Signed)
LCSW Group Therapy Note  05/27/2018 11:54 AM  Type of Therapy/Topic:  Group Therapy:  Balance in Life  Participation Level:  Did Not Attend  Description of Group:    This group will address the concept of balance and how it feels and looks when one is unbalanced. Patients will be encouraged to process areas in their lives that are out of balance and identify reasons for remaining unbalanced. Facilitators will guide patients in utilizing problem-solving interventions to address and correct the stressor making their life unbalanced. Understanding and applying boundaries will be explored and addressed for obtaining and maintaining a balanced life. Patients will be encouraged to explore ways to assertively make their unbalanced needs known to significant others in their lives, using other group members and facilitator for support and feedback.  Therapeutic Goals: 1. Patient will identify two or more emotions or situations they have that consume much of in their lives. 2. Patient will identify signs/triggers that life has become out of balance:  3. Patient will identify two ways to set boundaries in order to achieve balance in their lives:  4. Patient will demonstrate ability to communicate their needs through discussion and/or role plays  Summary of Patient Progress:  x    Therapeutic Modalities:   Cognitive Behavioral Therapy Solution-Focused Therapy Assertiveness Training  Uriah Trueba, MSW, LCSW Clinical Social Work 05/27/2018 11:54 AM   

## 2018-05-27 NOTE — [facility] Suicide Risk Assessment (Signed)
[facility] Admission Suicide Risk Assessment   Nursing information obtained from:  Patient Demographic factors:  Unemployed Current Mental Status:  Suicidal ideation indicated by patient Loss Factors:  Financial problems / change in socioeconomic status Historical Factors:  NA Risk Reduction Factors:  NA  Total Time spent with patient: 1 hour Principal Problem: Bipolar 2 disorder, major depressive episode (HCC) Diagnosis:  Principal Problem:   Bipolar 2 disorder, major depressive episode (HCC) Active Problems:   Borderline personality disorder (HCC)   DDD (degenerative disc disease), lumbar   Hypothyroidism due to acquired atrophy of thyroid   Essential hypertension   COPD (chronic obstructive pulmonary disease) (HCC)   Peptic ulcer   Suicidal ideation   Depression with suicidal ideation   Cocaine abuse (HCC)  Subjective Data: Patient seen chart reviewed.  Patient reports suicidal ideation.  Says she vaguely thought of taking an overdose of pills.  Did not actually act on it.  Mood has been depressed been worse than usual for about a week.  She says she relapsed into crack cocaine recently.  Also has major life stressors involving her environment at home.  Denies homicidal ideation.  Currently she is calm appropriate and cooperative with treatment and denies any intent or plan to hurt herself in the hospital.  Continued Clinical Symptoms:  Alcohol Use Disorder Identification Test Final Score (AUDIT): 0 The "Alcohol Use Disorders Identification Test", Guidelines for Use in Primary Care, Second Edition.  World Science writer [facility]). Score between 0-7:  no or low risk or alcohol related problems. Score between 8-15:  moderate risk of alcohol related problems. Score between 16-19:  high risk of alcohol related problems. Score 20 or above:  warrants further diagnostic evaluation for alcohol dependence and treatment.   CLINICAL FACTORS:   Depression:    Anhedonia Dysthymia Alcohol/Substance Abuse/Dependencies Personality Disorders:   Cluster B   Musculoskeletal: Strength & Muscle Tone: within normal limits Gait & Station: normal Patient leans: N/A  Psychiatric Specialty Exam: Physical Exam  Nursing note and vitals reviewed. Constitutional: She appears well-developed and well-nourished.  HENT:  Head: Normocephalic and atraumatic.  Eyes: Pupils are equal, round, and reactive to light. Conjunctivae are normal.  Neck: Normal range of motion.  Cardiovascular: Regular rhythm and normal heart sounds.  Respiratory: Effort normal. No respiratory distress.  GI: Soft.  Musculoskeletal: Normal range of motion.  Neurological: She is alert.  Skin: Skin is warm and dry.  Psychiatric: Her speech is normal and behavior is normal. Her mood appears anxious. Cognition and memory are normal. She expresses impulsivity. She expresses suicidal ideation. She expresses no suicidal plans.    Review of Systems  Constitutional: Negative.   HENT: Negative.   Eyes: Negative.   Respiratory: Negative.   Cardiovascular: Negative.   Gastrointestinal: Negative.   Musculoskeletal: Negative.   Skin: Negative.   Neurological: Negative.   Psychiatric/Behavioral: Positive for depression, hallucinations, substance abuse and suicidal ideas. Negative for memory loss. The patient is nervous/anxious and has insomnia.     Blood pressure 121/90, pulse 96, temperature 98.3 F (36.8 C), temperature source Oral, resp. rate 18, height 4\' 11"  (1.499 m), weight 80.7 kg, SpO2 92 %.Body mass index is 35.95 kg/m.  General Appearance: Casual  Eye Contact:  Fair  Speech:  Clear and Coherent  Volume:  Normal  Mood:  Depressed  Affect:  Congruent  Thought Process:  Goal Directed  Orientation:  Full (Time, Place, and Person)  Thought Content:  Logical and Hallucinations: Auditory  Suicidal Thoughts:  Yes.  with intent/plan  Homicidal Thoughts:  No  Memory:  Immediate;    Fair Recent;   Fair Remote;   Fair  Judgement:  Impaired  Insight:  Shallow  Psychomotor Activity:  Normal  Concentration:  Concentration: Fair  Recall:  [employer] of Knowledge:  Fair  Language:  Fair  Akathisia:  No  Handed:  Right  AIMS (if indicated):     Assets:  Desire for Improvement Housing Resilience  ADL's:  Intact  Cognition:  WNL  Sleep:  Number of Hours: 2.75      COGNITIVE FEATURES THAT CONTRIBUTE TO RISK:  Polarized thinking    SUICIDE RISK:   Mild:  Suicidal ideation of limited frequency, intensity, duration, and specificity.  There are no identifiable plans, no associated intent, mild dysphoria and related symptoms, good self-control (both objective and subjective assessment), few other risk factors, and identifiable protective factors, including available and accessible social support.  PLAN OF CARE: Patient will be kept on 15-minute checks.  Medicines adjusted to what she normally takes outside the hospital.  Possible changes in medication to address symptoms.  Included in therapeutic groups and activities.  Work on reassessment of suicidality prior to discharge  I certify that inpatient services furnished can reasonably be expected to improve the patient's condition.   Mordecai Rasmussen, MD 05/27/2018, 1:30 PM

## 2018-05-27 NOTE — H&P (Signed)
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  Head: Normocephalic and atraumatic.  Eyes: Pupils are equal, round, and reactive to light. Conjunctivae are normal.  Neck: Normal range of motion.  Cardiovascular: Regular rhythm and normal heart sounds.  Respiratory: Effort normal. No respiratory distress.  GI: Soft.  Musculoskeletal: Normal range of motion.  Neurological: She is alert.  Skin: Skin is warm and dry.  Psychiatric: Her speech is normal and behavior is normal. Her speech is not delayed. She expresses impulsivity. She exhibits a depressed mood. She expresses suicidal ideation. She expresses suicidal plans.    Review of Systems  Constitutional: Negative.   HENT: Negative.   Eyes: Negative.   Respiratory: Negative.    Cardiovascular: Negative.   Gastrointestinal: Negative.   Musculoskeletal: Negative.   Skin: Negative.   Neurological: Negative.   Psychiatric/Behavioral: Positive for depression, hallucinations, substance abuse and suicidal ideas. The patient is nervous/anxious and has insomnia.     Blood pressure 121/90, pulse 96, temperature 98.3 F (  36.8 C), temperature source Oral, resp. rate 18, height  (1.499 m), weight 80.7 kg, SpO2 92 %.Body mass index is 35.95 kg/m.  General Appearance: Casual  Eye Contact:  Good  Speech:  Normal Rate  Volume:  Normal  Mood:  Depressed  Affect:  Constricted  Thought Process:  Goal Directed  Orientation:  Full (Time, Place, and Person)  Thought Content:  Logical and Hallucinations: Auditory  Suicidal Thoughts:  Yes.  with intent/plan  Homicidal Thoughts:  No  Memory:  Immediate;   Fair Recent;   Fair Remote;   Fair  Judgement:  Fair  Insight:  Shallow  Psychomotor Activity:  Normal  Concentration:  Concentration: Fair  Recall:  [employer] of Knowledge:  Fair  Language:  Fair  Akathisia:  No  Handed:  Right  AIMS (if indicated):     Assets:  Communication Skills Desire for Improvement Housing Resilience  ADL's:  Intact  Cognition:  WNL  Sleep:  Number of Hours: 2.75    Treatment Plan Summary: Daily contact with patient to assess and evaluate symptoms and progress in treatment, Medication management and Plan Patient with chronic psychiatric problems.  Well-known to outpatient treatment at [facility] and to inpatient treatment here.  She is currently calm behaving herself well not showing any signs of overt disorganization or agitation but of course is still, seeming to have suicidal thoughts and hallucinations.  She had a list of minor discrepancies with her between the medicine she usually takes as an outpatient and what was continued when she was admitted.  As she tells me she takes approximately 30 medications and there were a few details that  were missed.  I restarted her gabapentin and Robaxin.  She apparently is on the correct amount of Risperdal for sleep.  Uses as needed Zofran.  Patient is asking whether she can go to an inpatient rehab program.  She does not seem to have ever been to one before.  I told her it would be something we could discuss with treatment team.  Continue 15-minute checks.  I will increase her Effexor back up to the previous 150 mg dose at her request.  We will get some more labs to complete the intake.  Observation Level/Precautions:  15 minute checks  Laboratory:  HbAIC  Psychotherapy:    Medications:    Consultations:    Discharge Concerns:    Estimated LOS:  Other:     Physician Treatment Plan for Primary Diagnosis: Bipolar 2 disorder, major depressive episode (HCC) Long Term Goal(s): Improvement in symptoms so as ready for discharge  Short Term Goals: Ability to verbalize feelings will improve and Ability to disclose and discuss suicidal ideas  Physician Treatment Plan for Secondary Diagnosis: Principal Problem:   Bipolar 2 disorder, major depressive episode (HCC) Active Problems:   Borderline personality disorder (HCC)   DDD (degenerative disc disease), lumbar   Hypothyroidism due to acquired atrophy of thyroid   Essential hypertension   COPD (chronic obstructive pulmonary disease) (HCC)   Peptic ulcer   Suicidal ideation   Depression with suicidal ideation   Cocaine abuse (HCC)  Long Term Goal(s): Improvement in symptoms so as ready for discharge  Short Term Goals: Ability to maintain clinical measurements within normal limits will improve  I certify that inpatient services furnished can reasonably be expected to improve the patient's condition.    Mordecai Rasmussen, MD 5/21/20201:35 PM

## 2018-05-27 NOTE — Tx Team (Signed)
Initial Treatment Plan 05/27/2018 2:38 AM Ellwood Dense [place] TDS:[id number]    PATIENT STRESSORS: Medication change or noncompliance Substance abuse   PATIENT STRENGTHS: Ability for insight Motivation for treatment/growth   PATIENT IDENTIFIED PROBLEMS: Suicidal Ideation Substance Abuse Anxiety                     DISCHARGE CRITERIA:  Improved stabilization in mood, thinking, and/or behavior Verbal commitment to aftercare and medication compliance  PRELIMINARY DISCHARGE PLAN: Attend 12-step recovery group Placement in alternative living arrangements  PATIENT/FAMILY INVOLVEMENT: This treatment plan has been presented to and reviewed with the patient, Kirsten Richardson. The patient has been given the opportunity to ask questions and make suggestions.  Elmyra Ricks, RN 05/27/2018, 2:38 AM

## 2018-05-27 NOTE — Plan of Care (Signed)
Patient is alert and oriented, Positive for auditory hallucinations, denies SI, and HI. Patient complains of nausea and pain. Patient comes to the nurses station wanting to know what medications she can have every 2-3 hours. Patient complains of anxiety, and depression. Safety checks Q 15 minutes will continue. Patient has been out of the room on the telephone in the dayroom, interacting with peers.  Problem: Education: Goal: Knowledge of [facility] Education information/materials will improve 05/27/2018 1902 by Leamon Arnt, RN Outcome: Progressing 05/27/2018 0916 by Leamon Arnt, RN Outcome: Progressing Goal: Emotional status will improve 05/27/2018 1902 by Leamon Arnt, RN Outcome: Progressing 05/27/2018 0916 by Leamon Arnt, RN Outcome: Progressing Goal: Mental status will improve 05/27/2018 1902 by Leamon Arnt, RN Outcome: Progressing 05/27/2018 0916 by Leamon Arnt, RN Outcome: Progressing Goal: Verbalization of understanding the information provided will improve 05/27/2018 1902 by Leamon Arnt, RN Outcome: Progressing 05/27/2018 0916 by Leamon Arnt, RN Outcome: Progressing   Problem: Education: Goal: Emotional status will improve 05/27/2018 1902 by Leamon Arnt, RN Outcome: Progressing 05/27/2018 0916 by Leamon Arnt, RN Outcome: Progressing   Problem: Safety: Goal: Periods of time without injury will increase 05/27/2018 1902 by Leamon Arnt, RN Outcome: Progressing 05/27/2018 0916 by Leamon Arnt, RN Outcome: Progressing

## 2018-05-27 NOTE — Progress Notes (Signed)
CSW faxed referral to [facility] Dorisann Frames [phone].  Iris Pert, MSW, LCSW Clinical Social Work 05/27/2018 2:22 PM

## 2018-05-27 NOTE — Plan of Care (Signed)
Patient newly admitted, hasn't had time to progress.    Problem: Education: Goal: Knowledge of [facility] Education information/materials will improve Outcome: Not Progressing Goal: Emotional status will improve Outcome: Not Progressing Goal: Mental status will improve Outcome: Not Progressing Goal: Verbalization of understanding the information provided will improve Outcome: Not Progressing   Problem: Safety: Goal: Periods of time without injury will increase Outcome: Not Progressing   Problem: Education: Goal: Ability to make informed decisions regarding treatment will improve Outcome: Not Progressing   Problem: Self-Concept: Goal: Ability to disclose and discuss suicidal ideas will improve Outcome: Not Progressing Goal: Will verbalize positive feelings about self Outcome: Not Progressing   

## 2018-05-27 NOTE — BH Assessment (Signed)
Patient is to be admitted to [facility] by Dr. NP Mikki Santee.  Attending Physician will be Dr. Toni Amend.   Patient has been assigned to room 324, by [facility] Charge Nurse Alex.   Intake Paper Work has been signed and placed on patient chart.  ER staff is aware of the admission:  [facility] ER Secretary    Dr. Dolores Frame, ER MD   Victorino Dike Patient's Nurse   Tresa Endo  Patient Access.

## 2018-05-27 NOTE — ED Notes (Signed)
Patient has bed ready in behavioral unit downstairs, but awaiting covid results to be able to call report.

## 2018-05-27 NOTE — Progress Notes (Signed)
D - Patient was received from [facility] Emergency Department at 0130. Patient was pleasant during assessment but stated that they woke her up to bring her down here so she wanted to sleep. Patient denies SI/HI and pain at this time. Patient endorses some anxiety and depression because she lives with her sister which caused her cocaine use to increase. Patient said she hears some voices and seeing small animals that aren't there. Patient said she wasn't hearing any voices right now or seeing anything. Patient contracted for safety with this Clinical research associate. Patient given education. Patient given support and encouragement to be active in her treatment plan. Patient being monitored Q 15 minutes for safety per unit protocol. Patient remains safe on the unit.

## 2018-05-27 NOTE — Progress Notes (Signed)
Patient complained of nausea, RN gave patient ginger ale, crackers and told patient she could place a cool towel on her head. Patient decided to stay in dinning area and continue to eat instead.

## 2018-05-27 NOTE — Plan of Care (Signed)
  Problem: Self-Concept: Goal: Ability to disclose and discuss suicidal ideas will improve Outcome: Progressing  Patient denies suicidal ideations.  

## 2018-05-27 NOTE — Progress Notes (Signed)
Recreation Therapy Notes  Date: 05/27/2018   Time: 9:30 am   Location: Craft room   Behavioral response: N/A   Intervention Topic: Decision Making  Discussion/Intervention: Patient did not attend group.   Clinical Observations/Feedback:  Patient did not attend group.   Charee Tumblin LRT/CTRS          Patrisia Faeth 05/27/2018 10:37 AM

## 2018-05-27 NOTE — [facility] Group Notes (Signed)
[facility] Group Notes:  (Nursing/MHT/Case Management/Adjunct)  Date:  05/27/2018  Time:  2:28 PM  Type of Therapy:  Psychoeducational Skills  Participation Level:  Active  Participation Quality:  Appropriate and Supportive  Affect:  Anxious, Appropriate and Excited  Cognitive:  Appropriate and Oriented  Insight:  Appropriate, Good and Improving  Engagement in Group:  Developing/Improving, Improving and Supportive  Modes of Intervention:  Socialization and Support  Summary of Progress/Problems:  Kirsten Richardson 05/27/2018, 2:28 PM

## 2018-05-27 NOTE — ED Provider Notes (Signed)
-----------------------------------------   12:36 AM on 05/27/2018 -----------------------------------------  Patient accepted to behavioral health unit.   Irean Hong, MD 05/27/18 [phone]

## 2018-05-28 DIAGNOSIS — F3181 Bipolar II disorder: Principal | ICD-10-CM

## 2018-05-28 MED ORDER — NICOTINE 21 MG/24HR TD PT24
21.0000 mg | MEDICATED_PATCH | Freq: Every day | TRANSDERMAL | Status: DC
  Administered 2018-05-28 – 2018-06-01 (×5): 21 mg via TRANSDERMAL
  Filled 2018-05-28 (×5): qty 1

## 2018-05-28 MED ORDER — PREDNISONE 10 MG PO TABS
20.0000 mg | ORAL_TABLET | Freq: Every day | ORAL | Status: AC
  Administered 2018-05-29: 20 mg via ORAL
  Filled 2018-05-28: qty 2

## 2018-05-28 MED ORDER — PREDNISONE 10 MG PO TABS
10.0000 mg | ORAL_TABLET | Freq: Every day | ORAL | Status: AC
  Administered 2018-05-30: 10 mg via ORAL
  Filled 2018-05-28: qty 1

## 2018-05-28 NOTE — BHH Group Notes (Signed)
Feelings Around Relapse 05/28/2018 1PM  Type of Therapy and Topic:  Group Therapy:  Feelings around Relapse and Recovery  Participation Level:  Did Not Attend   Description of Group:    Patients in this group will discuss emotions they experience before and after a relapse. They will process how experiencing these feelings, or avoidance of experiencing them, relates to having a relapse. Facilitator will guide patients to explore emotions they have related to recovery. Patients will be encouraged to process which emotions are more powerful. They will be guided to discuss the emotional reaction significant others in their lives may have to patients' relapse or recovery. Patients will be assisted in exploring ways to respond to the emotions of others without this contributing to a relapse.  Therapeutic Goals: 1. Patient will identify two or more emotions that lead to a relapse for them 2. Patient will identify two emotions that result when they relapse 3. Patient will identify two emotions related to recovery 4. Patient will demonstrate ability to communicate their needs through discussion and/or role plays   Summary of Patient Progress:     Therapeutic Modalities:   Cognitive Behavioral Therapy Solution-Focused Therapy Assertiveness Training Relapse Prevention Therapy   Suzan Slick, LCSW 05/28/2018 1:58 PM

## 2018-05-28 NOTE — Progress Notes (Signed)
Patient alert and oriented x 4, affect is blunted, mood is irritable , thoughts are organized and coherent appears needy, intrusive, manipulative  and medication seeking, she approached writer in medication room to give her 0.5mg  of the oxycone instead of  Prescribed 2.5mg  dosage, she was educated and redirected that she has to take prescribed MD's order. Patient was offered some emotional support and encouraged to attend evening wrap up group. Patient didn't attend group, but was noted in the milieu interacting appropriately with peers and staff. Patient denies SI/HI/AVH, 15 minutes safety checks maintained will continue to monitor .
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
## 2018-05-28 NOTE — Tx Team (Addendum)
Interdisciplinary Treatment and Diagnostic Plan Update  05/28/2018 Time of Session: 230PM Qu[place]al Meany MRN: [id number]  Principal Diagnosis: Bipolar 2 disorder, major depressive episode (HCC)  Secondary Diagnoses: Principal Problem:   Bipolar 2 disorder, major depressive episode (HCC) Active Problems:   Borderline personality disorder (HCC)   DDD (degenerative disc disease), lumbar   Hypothyroidism due to acquired atrophy of thyroid   Essential hypertension   COPD (chronic obstructive pulmonary disease) (HCC)   Peptic ulcer   Suicidal ideation   Depression with suicidal ideation   Cocaine abuse (HCC)   Current Medications:  Current Facility-Administered Medications  Medication Dose Route Frequency Provider Last Rate Last Dose  . acetaminophen (TYLENOL) tablet 650 mg  650 mg Oral Q6H PRN Thomspon, Adela Lank, NP      . albuterol (VENTOLIN HFA) 108 (90 Base) MCG/ACT inhaler 1-2 puff  1-2 puff Inhalation Q4H PRN Catalina Gravel, NP      . alum & mag hydroxide-simeth (MAALOX/MYLANTA) 200-200-20 MG/5ML suspension 30 mL  30 mL Oral Q4H PRN Catalina Gravel, NP      . buPROPion (WELLBUTRIN XL) 24 hr tablet 300 mg  300 mg Oral Daily Thomspon, Adela Lank, NP   300 mg at 05/28/18 0851  . famotidine (PEPCID) tablet 20 mg  20 mg Oral QHS Catalina Gravel, NP   20 mg at 05/27/18 2111  . fesoterodine (TOVIAZ) tablet 4 mg  4 mg Oral Daily Catalina Gravel, NP   4 mg at 05/28/18 0853  . gabapentin (NEURONTIN) capsule 300 mg  300 mg Oral TID Clapacs, John T, MD   300 mg at 05/28/18 1216  . hydrochlorothiazide (HYDRODIURIL) tablet 25 mg  25 mg Oral Daily Catalina Gravel, NP   25 mg at 05/28/18 0851  . hydrOXYzine (ATARAX/VISTARIL) tablet 50 mg  50 mg Oral TID PRN Catalina Gravel, NP      . ibuprofen (ADVIL) tablet 800 mg  800 mg Oral Q8H PRN Catalina Gravel, NP      . levothyroxine (SYNTHROID) tablet 50 mcg  50 mcg Oral Q0600 Catalina Gravel, NP   50 mcg at  05/27/18 0616  . loratadine (CLARITIN) tablet 10 mg  10 mg Oral Daily Catalina Gravel, NP   10 mg at 05/28/18 0851  . magnesium hydroxide (MILK OF MAGNESIA) suspension 30 mL  30 mL Oral Daily PRN Catalina Gravel, NP      . methocarbamol (ROBAXIN) tablet 750 mg  750 mg Oral QID Clapacs, John T, MD   750 mg at 05/28/18 1216  . mometasone-formoterol (DULERA) 200-5 MCG/ACT inhaler 2 puff  2 puff Inhalation BID Catalina Gravel, NP   2 puff at 05/28/18 0853  . nicotine (NICODERM CQ - dosed in mg/24 hours) patch 21 mg  21 mg Transdermal Once Thomspon, Adela Lank, NP      . nicotine (NICODERM CQ - dosed in mg/24 hours) patch 21 mg  21 mg Transdermal Daily Clapacs, John T, MD      . ondansetron (ZOFRAN) tablet 4 mg  4 mg Oral Q8H PRN Clapacs, John T, MD      . oxyCODONE (Oxy IR/ROXICODONE) immediate release tablet 2.5 mg  2.5 mg Oral Q4H PRN Catalina Gravel, NP   2.5 mg at 05/28/18 1501  . pantoprazole (PROTONIX) EC tablet 40 mg  40 mg Oral Daily Catalina Gravel, NP   40 mg at 05/28/18 0851  . [START ON 05/30/2018] predniSONE (DELTASONE) tablet 10 mg  10 mg Oral Q breakfast Clapacs, Jackquline Denmark, MD      . [  START ON 05/29/2018] predniSONE (DELTASONE) tablet 20 mg  20 mg Oral Q breakfast Clapacs, John T, MD      . risperiDONE (RISPERDAL) tablet 2 mg  2 mg Oral QHS Catalina Gravelhomspon, Jacqueline, NP   2 mg at 05/27/18 2111  . traZODone (DESYREL) tablet 100 mg  100 mg Oral QHS PRN Clapacs, Jackquline DenmarkJohn T, MD   100 mg at 05/27/18 2112  . venlafaxine (EFFEXOR) tablet 37.5 mg  37.5 mg Oral BID Catalina Gravelhomspon, Jacqueline, NP   37.5 mg at 05/28/18 0852  . venlafaxine XR (EFFEXOR-XR) 24 hr capsule 75 mg  75 mg Oral Q breakfast Clapacs, Jackquline DenmarkJohn T, MD   75 mg at 05/28/18 16100851   PTA Medications: Medications Prior to Admission  Medication Sig Dispense Refill Last Dose  . albuterol (PROVENTIL HFA;VENTOLIN HFA) 108 (90 Base) MCG/ACT inhaler Inhale into the lungs.   prn at prn  . amoxicillin (AMOXIL) 875 MG tablet Take 1 tablet  (875 mg total) by mouth 2 (two) times daily. 14 tablet 0   . budesonide-formoterol (SYMBICORT) 160-4.5 MCG/ACT inhaler Inhale 2 puffs into the lungs 2 (two) times daily.   08/19/2017 at 0600  . budesonide-formoterol (SYMBICORT) 160-4.5 MCG/ACT inhaler Inhale 2 puffs into the lungs 2 (two) times daily.   09/23/2017  . buPROPion (WELLBUTRIN XL) 300 MG 24 hr tablet Take 1 tablet (300 mg total) by mouth daily. 30 tablet 1   . cetirizine (ZYRTEC) 10 MG tablet Take 10 mg by mouth daily.   08/19/2017 at 0600  . clindamycin (CLEOCIN T) 1 % lotion Apply topically 2 (two) times daily as needed.   Not Taking at Unknown time  . clindamycin (CLEOCIN) 300 MG capsule Take 1 capsule (300 mg total) by mouth 4 (four) times daily. 28 capsule 0   . dexmethylphenidate (FOCALIN) 5 MG tablet Take 1 tablet (5 mg total) by mouth 2 (two) times daily. 60 tablet 0   . diclofenac sodium (VOLTAREN) 1 % GEL Apply topically 4 (four) times daily.   08/19/2017 at Unknown time  . famotidine (PEPCID) 20 MG tablet Take by mouth.   prn at prn  . fluticasone (FLONASE) 50 MCG/ACT nasal spray SHAKE LIQUID AND USE 2 SPRAYS IN EACH NOSTRIL EVERY DAY AS NEEDED FOR RHINITIS OR ALLERGIES   08/19/2017 at Unknown time  . gabapentin (NEURONTIN) 600 MG tablet Take 0.5 tablets (300 mg total) by mouth 3 (three) times daily. 45 tablet 1   . hydrochlorothiazide (HYDRODIURIL) 25 MG tablet Take 25 mg by mouth daily.   08/19/2017 at 0600  . hydrOXYzine (ATARAX/VISTARIL) 50 MG tablet Take 1 tablet (50 mg total) by mouth 3 (three) times daily as needed for anxiety. 60 tablet 1   . ibuprofen (ADVIL,MOTRIN) 800 MG tablet Take 800 mg by mouth every 8 (eight) hours as needed.   Not Taking at Unknown time  . levonorgestrel-ethinyl estradiol (NORDETTE) 0.15-30 MG-MCG tablet Take 1 tablet by mouth daily.   08/19/2017 at 0600  . levothyroxine (SYNTHROID, LEVOTHROID) 50 MCG tablet TAKE 1 TABLET(50 MCG) BY MOUTH EVERY DAY 30 TO 60 MINUTES BEFORE BREAKFAST ON AN EMPTY STOMACH  AND WITH A GLASS OF WATER   08/19/2017 at 0600  . lisinopril (PRINIVIL,ZESTRIL) 10 MG tablet Take 10 mg by mouth daily.   08/19/2017 at 0600  . magic mouthwash w/lidocaine SOLN Take 5 mLs by mouth 4 (four) times daily. Swish, gargle and spit 240 mL 0   . methocarbamol (ROBAXIN) 500 MG tablet Take 1 tablet by mouth 2 (two) times  daily.   08/19/2017 at 0600  . mirabegron ER (MYRBETRIQ) 25 MG TB24 tablet TAKE 1 TABLET BY MOUTH DAILY   08/19/2017 at 0600  . naloxegol oxalate (MOVANTIK) 12.5 MG TABS tablet Take 25 mg by mouth daily.   08/19/2017 at 0600  . norethindrone (MICRONOR,CAMILA,ERRIN) 0.35 MG tablet Take by mouth.   Not Taking at Unknown time  . omeprazole (PRILOSEC) 40 MG capsule Take 40 mg by mouth daily.   08/19/2017 at 0600  . ondansetron (ZOFRAN) 4 MG tablet Take 4 mg by mouth every 8 (eight) hours as needed for nausea or vomiting.     Marland Kitchen oxyCODONE (OXY IR/ROXICODONE) 5 MG immediate release tablet Take 5 mg by mouth every 4 (four) hours as needed.    08/19/2017 at Unknown time  . oxyCODONE-acetaminophen (PERCOCET/ROXICET) 5-325 MG tablet Take 1 tablet by mouth as needed for pain.   Not Taking at Unknown time  . pantoprazole (PROTONIX) 40 MG tablet TAKE 1 TABLET DAILY 2 HOURS AFTER SYNTHROID AND 1 TABLET 30 MINUTES BEFOREDINNER   08/19/2017 at 0600  . polyethylene glycol powder (GLYCOLAX/MIRALAX) powder Take by mouth.   prn at prn  . predniSONE (DELTASONE) 50 MG tablet Take 1 tablet (50 mg total) by mouth daily with breakfast. 5 tablet 0   . risperiDONE (RISPERDAL) 2 MG tablet Take 1 tablet (2 mg total) by mouth at bedtime. 30 tablet 1   . tiotropium (SPIRIVA) 18 MCG inhalation capsule Place 18 mcg into inhaler and inhale daily.    Not Taking at Unknown time  . tiZANidine (ZANAFLEX) 2 MG tablet LIMIT TO 2 TO 3 TS PO PER DAY IF TOLERATED  0 Not Taking at Unknown time  . tolterodine (DETROL LA) 2 MG 24 hr capsule TAKE ONE CAPSULE BY MOUTH ONCE DAILY   08/19/2017 at 0600  . traMADol (ULTRAM) 50 MG  tablet Take 1 tablet (50 mg total) by mouth 3 (three) times daily. (Patient taking differently: Take 50 mg by mouth 4 (four) times daily. ) 90 tablet 0 08/19/2017 at Unknown time  . traZODone (DESYREL) 100 MG tablet Take 1 tablet (100 mg total) by mouth at bedtime as needed for sleep. 30 tablet 1   . umeclidinium bromide (INCRUSE ELLIPTA) 62.5 MCG/INH AEPB Inhale 1 puff into the lungs daily.   09/23/2017  . valACYclovir (VALTREX) 1000 MG tablet Take 1,000 mg by mouth as needed.   Not Taking at Unknown time  . venlafaxine (EFFEXOR) 37.5 MG tablet Take 1 tablet (37.5 mg total) by mouth 2 (two) times daily. Also on 75 mg in the morning 60 tablet 1   . venlafaxine (EFFEXOR) 75 MG tablet Take 1 tablet (75 mg total) by mouth every morning. 30 tablet 1     Patient Stressors: Medication change or noncompliance Substance abuse  Patient Strengths: Ability for insight Motivation for treatment/growth  Treatment Modalities: Medication Management, Group therapy, Case management,  1 to 1 session with clinician, Psychoeducation, Recreational therapy.   Physician Treatment Plan for Primary Diagnosis: Bipolar 2 disorder, major depressive episode (HCC) Long Term Goal(s): Improvement in symptoms so as ready for discharge Improvement in symptoms so as ready for discharge   Short Term Goals: Ability to verbalize feelings will improve Ability to disclose and discuss suicidal ideas Ability to maintain clinical measurements within normal limits will improve  Medication Management: Evaluate patient's response, side effects, and tolerance of medication regimen.  Therapeutic Interventions: 1 to 1 sessions, Unit Group sessions and Medication administration.  Evaluation of Outcomes: Progressing  Physician Treatment Plan for Secondary Diagnosis: Principal Problem:   Bipolar 2 disorder, major depressive episode (HCC) Active Problems:   Borderline personality disorder (HCC)   DDD (degenerative disc disease),  lumbar   Hypothyroidism due to acquired atrophy of thyroid   Essential hypertension   COPD (chronic obstructive pulmonary disease) (HCC)   Peptic ulcer   Suicidal ideation   Depression with suicidal ideation   Cocaine abuse (HCC)  Long Term Goal(s): Improvement in symptoms so as ready for discharge Improvement in symptoms so as ready for discharge   Short Term Goals: Ability to verbalize feelings will improve Ability to disclose and discuss suicidal ideas Ability to maintain clinical measurements within normal limits will improve     Medication Management: Evaluate patient's response, side effects, and tolerance of medication regimen.  Therapeutic Interventions: 1 to 1 sessions, Unit Group sessions and Medication administration.  Evaluation of Outcomes: Progressing   RN Treatment Plan for Primary Diagnosis: Bipolar 2 disorder, major depressive episode (HCC) Long Term Goal(s): Knowledge of disease and therapeutic regimen to maintain health will improve  Short Term Goals: Ability to demonstrate self-control, Ability to participate in decision making will improve and Ability to identify and develop effective coping behaviors will improve  Medication Management: RN will administer medications as ordered by provider, will assess and evaluate patient's response and provide education to patient for prescribed medication. RN will report any adverse and/or side effects to prescribing provider.  Therapeutic Interventions: 1 on 1 counseling sessions, Psychoeducation, Medication administration, Evaluate responses to treatment, Monitor vital signs and CBGs as ordered, Perform/monitor CIWA, COWS, AIMS and Fall Risk screenings as ordered, Perform wound care treatments as ordered.  Evaluation of Outcomes: Progressing   LCSW Treatment Plan for Primary Diagnosis: Bipolar 2 disorder, major depressive episode (HCC) Long Term Goal(s): Safe transition to appropriate next level of care at discharge,  Engage patient in therapeutic group addressing interpersonal concerns.  Short Term Goals: Engage patient in aftercare planning with referrals and resources, Increase social support, Facilitate acceptance of mental health diagnosis and concerns, Facilitate patient progression through stages of change regarding substance use diagnoses and concerns, Identify triggers associated with mental health/substance abuse issues and Increase skills for wellness and recovery  Therapeutic Interventions: Assess for all discharge needs, 1 to 1 time with Social worker, Explore available resources and support systems, Assess for adequacy in community support network, Educate family and significant other(s) on suicide prevention, Complete Psychosocial Assessment, Interpersonal group therapy.  Evaluation of Outcomes: Progressing   Progress in Treatment: Attending groups: No. Participating in groups: No. Taking medication as prescribed: Yes. Toleration medication: Yes. Family/Significant other contact made: Yes, individual(s) contacted:  Pts ex boyfriend, Dellis FilbertMark Lindley Patient understands diagnosis: Yes. Discussing patient identified problems/goals with staff: Yes. Medical problems stabilized or resolved: Yes. Denies suicidal/homicidal ideation: Yes. Issues/concerns per patient self-inventory: No. Other: N/A  New problem(s) identified: No, Describe:  none  New Short Term/Long Term Goal(s):  medication management for mood stabilization;  development of comprehensive mental wellness/sobriety plan.   Patient Goals:  "start taking initiative to do things that need to be done"  Discharge Plan or Barriers: [facility] pamphlet, Mobile Crisis information, and AA/NA information provided to patient for additional community support and resources. Pt will follow up with [facility].  Reason for Continuation of Hospitalization: Medication stabilization  Estimated Length of Stay: 5-7 days    Recreational Therapy: Patient  Stressors: N/A Patient Goal: Patient will identify 3 positive coping skills strategies to use post d/c within 5 recreation therapy  group sessions  Attendees: Patient: Shella MaximBrenda Nijjar 05/28/2018 3:10 PM  Physician: Dr Toni Amendlapacs MD 05/28/2018 3:10 PM  Nursing: Cecille AmsterdamGigi Manirattui RN 05/28/2018 3:10 PM  RN Care Manager: 05/28/2018 3:10 PM  Social Worker: Zollie Scalelivia Moton LCSW 05/28/2018 3:10 PM  Recreational Therapist: Garret ReddishShay Kristol Almanzar CTRS LRT 05/28/2018 3:10 PM  Other: Lowella Dandyarren Livingston LCSW 05/28/2018 3:10 PM  Other:  05/28/2018 3:10 PM  Other: 05/28/2018 3:10 PM    Scribe for Treatment Team: Charlann Langelivia K Moton, LCSW 05/28/2018 3:10 PM

## 2018-05-28 NOTE — Plan of Care (Signed)
  Problem: Education: Goal: Ability to make informed decisions regarding treatment will improve Outcome: Progressing  Patient has insight and she is able to make informed  decisions about her treatment plan

## 2018-05-28 NOTE — Progress Notes (Signed)
Recreation Therapy Notes          Kirsten Richardson 05/28/2018 11:39 AM

## 2018-05-28 NOTE — Plan of Care (Signed)
Patient slept till this afternoon.Patient stated that her anxiety and depression is getting better.Patient states "I have suicidal thoughts since I was 11.I am trying to get rid of this with some positive thoughts."Denies HI and AVH.Patient is appropriate with staff & peers.Compliant with medications.Appetite and energy level good.Support and encouragement given.

## 2018-05-29 DIAGNOSIS — F3181 Bipolar II disorder: Secondary | ICD-10-CM
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
## 2018-05-29 NOTE — Progress Notes (Signed)
Patient alert and oriented x 4, affect is flat but she brightens upon approach, her thoughts are organized and coherent appears less anxious and intrusive, expressed to writer " l am tired this evening"  Patient was offered some emotional support and encouragement concerning her treatment plan. Patient didn't attend group, she was complaint with night time medication. Patient denies SI/HI/AVH, 15 minutes safety checks maintained will continue to monitor .

## 2018-05-29 NOTE — [facility] Group Notes (Signed)
LCSW Group Therapy Note   05/29/2018 1:15pm   Type of Therapy and Topic:  Group Therapy:  Trust and Honesty  Participation Level:  Active  Description of Group:    In this group patients will be asked to explore the value of being honest.  Patients will be guided to discuss their thoughts, feelings, and behaviors related to honesty and trusting in others. Patients will process together how trust and honesty relate to forming relationships with peers, family members, and self. Each patient will be challenged to identify and express feelings of being vulnerable. Patients will discuss reasons why people are dishonest and identify alternative outcomes if one was truthful (to self or others). This group will be process-oriented, with patients participating in exploration of their own experiences, giving and receiving support, and processing challenge from other group members.   Therapeutic Goals: 1. Patient will identify why honesty is important to relationships and how honesty overall affects relationships.  2. Patient will identify a situation where they lied or were lied too and the  feelings, thought process, and behaviors surrounding the situation 3. Patient will identify the meaning of being vulnerable, how that feels, and how that correlates to being honest with self and others. 4. Patient will identify situations where they could have told the truth, but instead lied and explain reasons of dishonesty.   Summary of Patient Progress: The patient reported that she feels "better." The patient was able to explore the value of being honest.  Patient discussed thoughts, feelings, and behaviors related to honesty and trusting in others. The patient processed together with other group members how trust and honesty relate to forming relationships with peers, family members, and self. Pt actively and appropriately engaged in the group. Patient was able to provide support and validation to other group  members. Patient practiced active listening when interacting with the facilitator and other group members.    Therapeutic Modalities:   Cognitive Behavioral Therapy Solution Focused Therapy Motivational Interviewing Brief Therapy  Erik Nessel  CUEBAS-COLON, LCSW 05/29/2018 12:36 PM

## 2018-05-29 NOTE — Plan of Care (Signed)
Patient stated that she slept good last night.Patient rated her depression 7/10 and anxiety 8/10.Stated that she gets suicidal thoughts at times but she is trying to overcome that.Patient is more visible in the milieu today.Affect is brighter.Appetite and energy level good.Compliant with medications.Support and encouragement given.

## 2018-05-30 DIAGNOSIS — F3181 Bipolar II disorder: Secondary | ICD-10-CM

## 2018-05-30 LAB — POTASSIUM: Potassium: 4 mmol/L (ref 3.5–5.1)

## 2018-05-30 NOTE — Progress Notes (Signed)
D. Pt presents with a flat affect , brightens during interactions- calm and cooperative behavior- medicated for continued pain @ upper back- Pt currently denies SI/HI and AVH  A. Labs and vitals monitored. Pt compliant with medications. Pt supported emotionally and encouraged to express concerns and ask questions.   R. Pt remains safe with 15 minute checks. Will continue POC.

## 2018-05-30 NOTE — Progress Notes (Signed)
DAR NOTE: Patient presents with anxious affect and depressed mood. Pt complained of pain in both her hands which has been going on for awhile. Denies auditory and visual hallucinations.  Maintained on routine safety checks.  Medications given as prescribed.  Support and encouragement offered as needed.  Will continue to monitor.

## 2018-05-30 NOTE — Progress Notes (Signed)
[place] NOVEL CORONAVIRUS (COVID-19) DAILY CHECK-OFF SYMPTOMS - answer yes or no to each - every day NO YES  Have you had a fever in the past 24 hours?  . Fever (Temp > 37.80C / 100F) X   Have you had any of these symptoms in the past 24 hours? . New Cough .  Sore Throat  .  Shortness of Breath .  Difficulty Breathing .  Unexplained Body Aches   X   Have you had any one of these symptoms in the past 24 hours not related to allergies?   . Runny Nose .  Nasal Congestion .  Sneezing   X   If you have had runny nose, nasal congestion, sneezing in the past 24 hours, has it worsened?  X   EXPOSURES - check yes or no X   Have you traveled outside the state in the past 14 days?  X   Have you been in contact with someone with a confirmed diagnosis of COVID-19 or PUI in the past 14 days without wearing appropriate PPE?  X   Have you been living in the same home as a person with confirmed diagnosis of COVID-19 or a PUI (household contact)?    X   Have you been diagnosed with COVID-19?    X              What to do next: Answered NO to all: Answered YES to anything:   Proceed with unit schedule Follow the [facility] Inpatient Flowsheet.   

## 2018-05-30 NOTE — Plan of Care (Addendum)
Patient reported that she enjoyed the group led by SW today and voiced an  interest in attending an NA group  Problem: Education: Goal: Emotional status will improve Outcome: Progressing Goal: Mental status will improve Outcome: Progressing

## 2018-05-30 NOTE — [facility] Group Notes (Signed)
LCSW Group Therapy Note 05/30/2018 1:15pm  Type of Therapy and Topic: Group Therapy: Feelings Around Returning Home & Establishing a Supportive Framework and Supporting Oneself When Supports Not Available  Participation Level: Active  Description of Group:  Patients first processed thoughts and feelings about upcoming discharge. These included fears of upcoming changes, lack of change, new living environments, judgements and expectations from others and overall stigma of mental health issues. The group then discussed the definition of a supportive framework, what that looks and feels like, and how do to discern it from an unhealthy non-supportive network. The group identified different types of supports as well as what to do when your family/friends are less than helpful or unavailable  Therapeutic Goals  1. Patient will identify one healthy supportive network that they can use at discharge. 2. Patient will identify one factor of a supportive framework and how to tell it from an unhealthy network. 3. Patient able to identify one coping skill to use when they do not have positive supports from others. 4. Patient will demonstrate ability to communicate their needs through discussion and/or role plays.  Summary of Patient Progress:  Patient reported she feels "okay." She states she wants to let go of her anxiety. Pt engaged during group session. As patients processed their anxiety about discharge and described healthy supports patient shared she is ready to be discharge because she is feeling better.  Patients identified at least one self-care tool they were willing to use after discharge.   Therapeutic Modalities Cognitive Behavioral Therapy Motivational Interviewing   Kirsten Richardson  CUEBAS-COLON, LCSW 05/30/2018 11:59 AM
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
## 2018-05-31 MED ORDER — ALBUTEROL SULFATE HFA 108 (90 BASE) MCG/ACT IN AERS: 1.0000 | INHALATION_SPRAY | RESPIRATORY_TRACT | 0 refills | Status: DC | PRN

## 2018-05-31 MED ORDER — MOMETASONE FURO-FORMOTEROL FUM 200-5 MCG/ACT IN AERO: 2.0000 | INHALATION_SPRAY | Freq: Two times a day (BID) | RESPIRATORY_TRACT | 0 refills | Status: DC

## 2018-05-31 MED ORDER — LISINOPRIL 10 MG PO TABS: 10.0000 mg | ORAL_TABLET | Freq: Every day | ORAL | 0 refills | Status: DC

## 2018-05-31 MED ORDER — FAMOTIDINE 20 MG PO TABS: 20.0000 mg | ORAL_TABLET | Freq: Every day | ORAL | 0 refills | Status: DC

## 2018-05-31 MED ORDER — GABAPENTIN 300 MG PO CAPS: 300.0000 mg | ORAL_CAPSULE | Freq: Three times a day (TID) | ORAL | 0 refills | Status: DC

## 2018-05-31 MED ORDER — LEVOTHYROXINE SODIUM 50 MCG PO TABS: 50.0000 ug | ORAL_TABLET | Freq: Every day | ORAL | 0 refills | Status: DC

## 2018-05-31 MED ORDER — TRAZODONE HCL 100 MG PO TABS: 100.0000 mg | ORAL_TABLET | Freq: Every evening | ORAL | 0 refills | Status: DC | PRN

## 2018-05-31 MED ORDER — PANTOPRAZOLE SODIUM 40 MG PO TBEC: 40.0000 mg | DELAYED_RELEASE_TABLET | Freq: Every day | ORAL | 0 refills | Status: DC

## 2018-05-31 MED ORDER — HYDROCHLOROTHIAZIDE 25 MG PO TABS: 25.0000 mg | ORAL_TABLET | Freq: Every day | ORAL | 0 refills | Status: DC

## 2018-05-31 MED ORDER — VENLAFAXINE HCL ER 75 MG PO CP24: 75.0000 mg | ORAL_CAPSULE | Freq: Every day | ORAL | 0 refills | Status: DC

## 2018-05-31 MED ORDER — LORATADINE 10 MG PO TABS: 10.0000 mg | ORAL_TABLET | Freq: Every day | ORAL | 0 refills | Status: DC

## 2018-05-31 MED ORDER — RISPERIDONE 2 MG PO TABS: 2.0000 mg | ORAL_TABLET | Freq: Every day | ORAL | 0 refills | Status: DC

## 2018-05-31 MED ORDER — HYDROXYZINE HCL 50 MG PO TABS: 50.0000 mg | ORAL_TABLET | Freq: Three times a day (TID) | ORAL | 0 refills | Status: DC | PRN

## 2018-05-31 MED ORDER — BUPROPION HCL ER (XL) 300 MG PO TB24: 300.0000 mg | ORAL_TABLET | Freq: Every day | ORAL | 0 refills | Status: DC

## 2018-05-31 MED ORDER — METHOCARBAMOL 750 MG PO TABS: 750.0000 mg | ORAL_TABLET | Freq: Four times a day (QID) | ORAL | 0 refills | Status: DC

## 2018-05-31 MED ORDER — FESOTERODINE FUMARATE ER 4 MG PO TB24: 4.0000 mg | ORAL_TABLET | Freq: Every day | ORAL | 0 refills | Status: DC

## 2018-05-31 MED ORDER — VENLAFAXINE HCL 37.5 MG PO TABS: 37.5000 mg | ORAL_TABLET | Freq: Two times a day (BID) | ORAL | 0 refills | Status: DC

## 2018-05-31 NOTE — Progress Notes (Signed)
Recreation Therapy Notes   Date: 05/31/2018  Time: 9:30 am   Location: Craft room   Behavioral response: N/A   Intervention Topic: Coping skills  Discussion/Intervention: Patient did not attend group.   Clinical Observations/Feedback:  Patient did not attend group.   Teryl Mcconaghy LRT/CTRS        Zeeva Courser 05/31/2018 11:01 AM

## 2018-05-31 NOTE — [facility] Suicide Risk Assessment (Signed)
[facility] Discharge Suicide Risk Assessment   Principal Problem: Bipolar 2 disorder, major depressive episode (HCC) Discharge Diagnoses: Principal Problem:   Bipolar 2 disorder, major depressive episode (HCC) Active Problems:   Borderline personality disorder (HCC)   DDD (degenerative disc disease), lumbar   Hypothyroidism due to acquired atrophy of thyroid   Essential hypertension   COPD (chronic obstructive pulmonary disease) (HCC)   Peptic ulcer   Suicidal ideation   Depression with suicidal ideation   Cocaine abuse (HCC)   Total Time spent with patient: 45 minutes  Musculoskeletal: Strength & Muscle Tone: within normal limits Gait & Station: normal Patient leans: N/A  Psychiatric Specialty Exam: Review of Systems  Constitutional: Negative.   HENT: Negative.   Eyes: Negative.   Respiratory: Negative.   Cardiovascular: Negative.   Gastrointestinal: Negative.   Musculoskeletal: Negative.   Skin: Negative.   Neurological: Negative.   Psychiatric/Behavioral: Negative.     Blood pressure 116/62, pulse 80, temperature 97.9 F (36.6 C), temperature source Oral, resp. rate 16, height 4\' 11"  (1.499 m), weight 80.7 kg, SpO2 96 %.Body mass index is 35.95 kg/m.  General Appearance: Casual  Eye Contact::  Fair  Speech:  Normal Rate409  Volume:  Normal  Mood:  Euthymic  Affect:  Congruent  Thought Process:  Goal Directed  Orientation:  Full (Time, Place, and Person)  Thought Content:  Logical  Suicidal Thoughts:  No  Homicidal Thoughts:  No  Memory:  Immediate;   Fair Recent;   Fair Remote;   Fair  Judgement:  Fair  Insight:  Fair  Psychomotor Activity:  Normal  Concentration:  Fair  Recall:  [employer] of Knowledge:Fair  Language: Fair  Akathisia:  No  Handed:  Right  AIMS (if indicated):     Assets:  Desire for Improvement Resilience  Sleep:  Number of Hours: 7.5  Cognition: WNL  ADL's:  Intact   Mental Status Per Nursing Assessment::   On Admission:  Suicidal  ideation indicated by patient  Demographic Factors:  Unemployed  Loss Factors: Financial problems/change in socioeconomic status  Historical Factors: Impulsivity  Risk Reduction Factors:   Sense of responsibility to family, Religious beliefs about death, Living with another person, especially a relative, Positive social support and Positive therapeutic relationship  Continued Clinical Symptoms:  Depression:   Comorbid alcohol abuse/dependence Alcohol/Substance Abuse/Dependencies  Cognitive Features That Contribute To Risk:  None    Suicide Risk:  Minimal: No identifiable suicidal ideation.  Patients presenting with no risk factors but with morbid ruminations; may be classified as minimal risk based on the severity of the depressive symptoms    Plan Of Care/Follow-up recommendations:  Activity:  Activity as tolerated Diet:  Regular diet Other:  Follow-up with outpatient local mental health care including substance abuse treatment.  Kirsten Rasmussen, MD 05/31/2018, 3:01 PM
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
## 2018-05-31 NOTE — Plan of Care (Signed)
  Problem: Self-Concept: Goal: Will verbalize positive feelings about self Outcome: Progressing  Patient was bright, pleasant, appears less anxious denies SI stated " l feel better "

## 2018-05-31 NOTE — Progress Notes (Addendum)
Patient alert and oriented x 4, affect is blunted , she assertive but brightens upon approach, she denies SI/HI/AVH, she appears less anxious interacting  appropriately with peers and staff and complaint with prescribed medication regimen. Patient was offered support and encouragement and she was receptive. 15 minutes safety checks maintained will continue to monitor.

## 2018-05-31 NOTE — Plan of Care (Signed)
Patient is alert and oriented X 3, denies SI, HI but continues to hear voices and visual hallucinations. Patient is pleasant in demeanor, complains of intermittent pain. Patient is compliant with medications and cooperative with peers and staff on the unit. Safety checks Q 15 minutes. Problem: Education: Goal: Knowledge of [facility] Education information/materials will improve Outcome: Progressing Goal: Emotional status will improve Outcome: Progressing Goal: Mental status will improve Outcome: Progressing Goal: Verbalization of understanding the information provided will improve Outcome: Progressing   Problem: Safety: Goal: Periods of time without injury will increase Outcome: Progressing   Problem: Education: Goal: Ability to make informed decisions regarding treatment will improve Outcome: Progressing   Problem: Self-Concept: Goal: Ability to disclose and discuss suicidal ideas will improve Outcome: Progressing Goal: Will verbalize positive feelings about self Outcome: Progressing

## 2018-06-01 MED ORDER — VENLAFAXINE HCL 37.5 MG PO TABS: 37.5000 mg | ORAL_TABLET | Freq: Two times a day (BID) | ORAL | 1 refills | Status: DC

## 2018-06-01 MED ORDER — GABAPENTIN 300 MG PO CAPS: 300.0000 mg | ORAL_CAPSULE | Freq: Three times a day (TID) | ORAL | 1 refills | Status: DC

## 2018-06-01 MED ORDER — TRAZODONE HCL 100 MG PO TABS: 100.0000 mg | ORAL_TABLET | Freq: Every evening | ORAL | 1 refills | Status: DC | PRN

## 2018-06-01 MED ORDER — FESOTERODINE FUMARATE ER 4 MG PO TB24: 4.0000 mg | ORAL_TABLET | Freq: Every day | ORAL | 1 refills | Status: DC

## 2018-06-01 MED ORDER — PANTOPRAZOLE SODIUM 40 MG PO TBEC: 40.0000 mg | DELAYED_RELEASE_TABLET | Freq: Every day | ORAL | 1 refills | Status: DC

## 2018-06-01 MED ORDER — MOMETASONE FURO-FORMOTEROL FUM 200-5 MCG/ACT IN AERO: 2.0000 | INHALATION_SPRAY | Freq: Two times a day (BID) | RESPIRATORY_TRACT | 1 refills | Status: DC

## 2018-06-01 MED ORDER — METHOCARBAMOL 750 MG PO TABS: 750.0000 mg | ORAL_TABLET | Freq: Four times a day (QID) | ORAL | 1 refills | Status: DC

## 2018-06-01 MED ORDER — LORATADINE 10 MG PO TABS: 10.0000 mg | ORAL_TABLET | Freq: Every day | ORAL | 1 refills | Status: DC

## 2018-06-01 MED ORDER — HYDROXYZINE HCL 50 MG PO TABS: 50.0000 mg | ORAL_TABLET | Freq: Three times a day (TID) | ORAL | 1 refills | Status: DC | PRN

## 2018-06-01 MED ORDER — ALBUTEROL SULFATE HFA 108 (90 BASE) MCG/ACT IN AERS: 1.0000 | INHALATION_SPRAY | RESPIRATORY_TRACT | 1 refills | Status: DC | PRN

## 2018-06-01 MED ORDER — BUPROPION HCL ER (XL) 300 MG PO TB24: 300.0000 mg | ORAL_TABLET | Freq: Every day | ORAL | 1 refills | Status: DC

## 2018-06-01 MED ORDER — HYDROCHLOROTHIAZIDE 25 MG PO TABS: 25.0000 mg | ORAL_TABLET | Freq: Every day | ORAL | 1 refills | Status: DC

## 2018-06-01 MED ORDER — VENLAFAXINE HCL ER 75 MG PO CP24: 75.0000 mg | ORAL_CAPSULE | Freq: Every day | ORAL | 1 refills | Status: DC

## 2018-06-01 MED ORDER — FAMOTIDINE 20 MG PO TABS: 20.0000 mg | ORAL_TABLET | Freq: Every day | ORAL | 1 refills | Status: DC

## 2018-06-01 MED ORDER — LEVOTHYROXINE SODIUM 50 MCG PO TABS: 50.0000 ug | ORAL_TABLET | Freq: Every day | ORAL | 1 refills | Status: DC

## 2018-06-01 MED ORDER — RISPERIDONE 2 MG PO TABS: 2.0000 mg | ORAL_TABLET | Freq: Every day | ORAL | 1 refills | Status: DC

## 2018-06-01 MED ORDER — LISINOPRIL 10 MG PO TABS: 10.0000 mg | ORAL_TABLET | Freq: Every day | ORAL | 1 refills | Status: DC

## 2018-06-01 NOTE — Progress Notes (Signed)
D: Patient has been isolative to room. Came out for snack. Admits to having some thoughts of suicide but is able to contract for safety on the unit. Denies AVH. Affect is sad. Mood is depressed.  A: Continue to monitor for safety.  R: Safety maintained.

## 2018-06-01 NOTE — Progress Notes (Signed)
Recreation Therapy Notes  INPATIENT RECREATION TR PLAN  Patient Details Name: Kirsten Richardson MRN: [id number] DOB: 06/02/1975 Today's Date: 06/01/2018  Rec Therapy Plan Is patient appropriate for Therapeutic Recreation?: Yes Treatment times per week: At least 3 Estimated Length of Stay: 5-7 days TR Treatment/Interventions: Group participation (Comment)  Discharge Criteria Pt will be discharged from therapy if:: Discharged Treatment plan/goals/alternatives discussed and agreed upon by:: Patient/family  Discharge Summary Short term goals set: Patient will identify 3 positive coping skills strategies to use post d/c within 5 recreation therapy group sessions Short term goals met: Not met Reason goals not met: Patient did not attend any groups, Therapeutic equipment acquired: N/A Reason patient discharged from therapy: Discharge from hospital Pt/family agrees with progress & goals achieved: Yes Date patient discharged from therapy: 06/01/18      06/01/2018, 11:58 AM  

## 2018-06-01 NOTE — [facility] Group Notes (Signed)
  LCSW Group Therapy Note  06/01/2018 2:32 PM   Type of Therapy/Topic:  Group Therapy:  Feelings about Diagnosis  Participation Level:  Did Not Attend   Description of Group:   This group will allow patients to explore their thoughts and feelings about diagnoses they have received. Patients will be guided to explore their level of understanding and acceptance of these diagnoses. Facilitator will encourage patients to process their thoughts and feelings about the reactions of others to their diagnosis and will guide patients in identifying ways to discuss their diagnosis with significant others in their lives. This group will be process-oriented, with patients participating in exploration of their own experiences, giving and receiving support, and processing challenge from other group members.   Therapeutic Goals: 1. Patient will demonstrate understanding of diagnosis as evidenced by identifying two or more symptoms of the disorder 2. Patient will be able to express two feelings regarding the diagnosis 3. Patient will demonstrate their ability to communicate their needs through discussion and/or role play  Summary of Patient Progress: x   Therapeutic Modalities:   Cognitive Behavioral Therapy Brief Therapy Feelings Identification    Ayaz Sondgeroth, MSW, LCSW Clinical Social Work 06/01/2018 2:32 PM    

## 2018-06-01 NOTE — Progress Notes (Signed)
  [facility] Adult Case Management Discharge Plan :  Will you be returning to the same living situation after discharge:  Yes,  ex byfriend home At discharge, do you have transportation home?: Yes,  ex boyfriend will pick pt up at 4PM Do you have the ability to pay for your medications: Yes,  medicaid and medicare insurance  Release of information consent forms completed and in the chart;    Patient to Follow up at: Follow-up Information    [facility] Follow up on 06/02/2018.   Why:  Meet Unk Pinto Wednesday 06/02/18 at 8:00AM for peer support services. Thank you! Contact information: [address] [place] [address] [phone]           Next level of care provider has access to [facility] Link:no  Safety Planning and Suicide Prevention discussed: Yes,  SPE completed with pts ex boyfriend  Have you used any form of tobacco in the last 30 days? (Cigarettes, Smokeless Tobacco, Cigars, and/or Pipes): Yes  Has patient been referred to the Quitline?: Patient refused referral  Patient has been referred for addiction treatment: Yes , pt denied by [facility]  Mechele Dawley, LCSW 06/01/2018, 9:39 AM

## 2018-06-01 NOTE — Plan of Care (Signed)
  Problem: Education: Goal: Knowledge of [facility] Education information/materials will improve Outcome: Progressing Goal: Emotional status will improve Outcome: Progressing Goal: Mental status will improve Outcome: Progressing Goal: Verbalization of understanding the information provided will improve Outcome: Progressing   D: Patient has been isolative to room. Came out for snack. Admits to having some thoughts of suicide but is able to contract for safety on the unit. Denies AVH. Affect is sad. Mood is depressed.  A: Continue to monitor for safety.  R: Safety maintained.
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
## 2018-06-01 NOTE — Progress Notes (Signed)
Recreation Therapy Notes   Date: 06/01/2018  Time: 9:30 am   Location: Craft room   Behavioral response: N/A   Intervention Topic: Goals  Discussion/Intervention: Patient did not attend group.   Clinical Observations/Feedback:  Patient did not attend group.   Teisha Trowbridge LRT/CTRS        Pandora Mccrackin 06/01/2018 11:35 AM

## 2018-09-13 ENCOUNTER — Other Ambulatory Visit: Payer: Self-pay | Admitting: Psychiatry

## 2018-09-14 ENCOUNTER — Other Ambulatory Visit: Payer: Self-pay | Admitting: Psychiatry

## 2018-09-19 ENCOUNTER — Emergency Department
Admission: EM | Admit: 2018-09-19 | Discharge: 2018-09-19 | Disposition: A | Discharge status: Home / Self Care | Payer: Medicare Other | Attending: Student in an Organized Health Care Education/Training Program | Admitting: Student in an Organized Health Care Education/Training Program

## 2018-09-19 ENCOUNTER — Other Ambulatory Visit: Payer: Self-pay

## 2018-09-19 ENCOUNTER — Encounter: Payer: Self-pay | Admitting: Emergency Medicine

## 2018-09-19 DIAGNOSIS — E039 Hypothyroidism, unspecified: Secondary | ICD-10-CM | POA: Insufficient documentation

## 2018-09-19 DIAGNOSIS — J449 Chronic obstructive pulmonary disease, unspecified: Secondary | ICD-10-CM | POA: Insufficient documentation

## 2018-09-19 DIAGNOSIS — Z79899 Other long term (current) drug therapy: Secondary | ICD-10-CM | POA: Insufficient documentation

## 2018-09-19 DIAGNOSIS — I10 Essential (primary) hypertension: Secondary | ICD-10-CM | POA: Insufficient documentation

## 2018-09-19 DIAGNOSIS — Z87891 Personal history of nicotine dependence: Secondary | ICD-10-CM | POA: Insufficient documentation

## 2018-09-19 DIAGNOSIS — J029 Acute pharyngitis, unspecified: Secondary | ICD-10-CM

## 2018-09-19 LAB — GROUP A STREP BY PCR: Group A Strep by PCR: NOT DETECTED

## 2018-09-19 MED ORDER — FEXOFENADINE-PSEUDOEPHED ER 180-240 MG PO TB24: 1.0000 | ORAL_TABLET | Freq: Every day | ORAL | 0 refills | Status: DC

## 2018-09-19 NOTE — ED Triage Notes (Signed)
Pt arrives with complaints of sore throat that started 2 days prior. Pt denies acute cough/shortness of breath

## 2018-09-19 NOTE — ED Notes (Signed)
See triage note  Presents with 2 day hx of sore throat   No fever or congestion  Afebrile on arrival

## 2018-09-19 NOTE — Discharge Instructions (Addendum)
Follow-up with your primary care provider if any continued problems.  Discontinue taking the Zyrtec and begin trying the Allegra-D which was sent to your pharmacy.  This medication may help better than the Zyrtec since you have been on it for some time.  This also can be prescribed by your primary care if you feel like it is working better.  Drink fluids frequently.  Return to the emergency department if any severe worsening of your symptoms.
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
## 2018-10-01 ENCOUNTER — Other Ambulatory Visit: Payer: Self-pay | Admitting: Psychiatry

## 2018-10-09 ENCOUNTER — Other Ambulatory Visit: Payer: Self-pay | Admitting: Psychiatry

## 2018-10-14 ENCOUNTER — Other Ambulatory Visit: Payer: Self-pay | Admitting: Acute Care

## 2018-10-14 DIAGNOSIS — M542 Cervicalgia: Secondary | ICD-10-CM

## 2018-10-14 DIAGNOSIS — R200 Anesthesia of skin: Secondary | ICD-10-CM

## 2018-10-14 DIAGNOSIS — R202 Paresthesia of skin: Secondary | ICD-10-CM

## 2018-10-15 ENCOUNTER — Other Ambulatory Visit: Payer: Self-pay | Admitting: Psychiatry

## 2018-10-27 ENCOUNTER — Ambulatory Visit: Payer: Medicare Other

## 2018-11-18 DIAGNOSIS — M79642 Pain in left hand: Secondary | ICD-10-CM | POA: Insufficient documentation

## 2018-11-20 ENCOUNTER — Other Ambulatory Visit: Payer: Self-pay | Admitting: Psychiatry

## 2018-12-06 ENCOUNTER — Other Ambulatory Visit: Payer: Self-pay | Admitting: Psychiatry

## 2018-12-09 ENCOUNTER — Other Ambulatory Visit: Payer: Self-pay | Admitting: Obstetrics and Gynecology

## 2018-12-09 DIAGNOSIS — Z1231 Encounter for screening mammogram for malignant neoplasm of breast: Secondary | ICD-10-CM

## 2018-12-30 ENCOUNTER — Other Ambulatory Visit: Payer: Self-pay | Admitting: Psychiatry

## 2019-01-03 ENCOUNTER — Other Ambulatory Visit: Payer: Self-pay | Admitting: Psychiatry

## 2019-01-23 ENCOUNTER — Other Ambulatory Visit: Payer: Self-pay | Admitting: Psychiatry

## 2019-01-27 ENCOUNTER — Other Ambulatory Visit: Payer: Self-pay | Admitting: Psychiatry

## 2019-02-18 ENCOUNTER — Ambulatory Visit: Admission: RE | Admit: 2019-02-18 | Payer: Medicare Other | Source: Ambulatory Visit

## 2019-02-27 ENCOUNTER — Ambulatory Visit
Admission: RE | Admit: 2019-02-27 | Discharge: 2019-02-27 | Disposition: A | Discharge status: Home / Self Care | Payer: Medicare Other | Source: Ambulatory Visit | Attending: Acute Care | Admitting: Acute Care

## 2019-02-27 DIAGNOSIS — R200 Anesthesia of skin: Secondary | ICD-10-CM | POA: Insufficient documentation

## 2019-02-27 DIAGNOSIS — M542 Cervicalgia: Secondary | ICD-10-CM | POA: Insufficient documentation

## 2019-02-27 DIAGNOSIS — R202 Paresthesia of skin: Secondary | ICD-10-CM | POA: Insufficient documentation

## 2019-04-07 ENCOUNTER — Other Ambulatory Visit: Payer: Self-pay | Admitting: Student

## 2019-04-07 DIAGNOSIS — R110 Nausea: Secondary | ICD-10-CM

## 2019-04-07 DIAGNOSIS — K219 Gastro-esophageal reflux disease without esophagitis: Secondary | ICD-10-CM

## 2019-04-20 ENCOUNTER — Ambulatory Visit: Payer: Medicare Other | Admitting: Urology

## 2019-04-20 ENCOUNTER — Encounter: Payer: Self-pay | Admitting: Urology

## 2019-04-21 ENCOUNTER — Other Ambulatory Visit: Payer: Self-pay | Admitting: Psychiatry

## 2019-04-28 ENCOUNTER — Ambulatory Visit
Admission: RE | Admit: 2019-04-28 | Discharge: 2019-04-28 | Disposition: A | Discharge status: Home / Self Care | Payer: Medicare Other | Source: Ambulatory Visit | Attending: Student | Admitting: Student

## 2019-04-28 ENCOUNTER — Other Ambulatory Visit: Payer: Self-pay

## 2019-04-28 DIAGNOSIS — R110 Nausea: Secondary | ICD-10-CM | POA: Diagnosis present

## 2019-04-28 DIAGNOSIS — K219 Gastro-esophageal reflux disease without esophagitis: Secondary | ICD-10-CM

## 2019-04-29 ENCOUNTER — Encounter: Payer: Self-pay | Admitting: Emergency Medicine

## 2019-04-29 ENCOUNTER — Other Ambulatory Visit: Payer: Self-pay

## 2019-04-29 ENCOUNTER — Emergency Department
Admission: EM | Admit: 2019-04-29 | Discharge: 2019-04-30 | Disposition: A | Discharge status: Other Acute Inpatient Hospital | Payer: Medicare Other | Attending: Student | Admitting: Student

## 2019-04-29 DIAGNOSIS — Z20822 Contact with and (suspected) exposure to covid-19: Secondary | ICD-10-CM | POA: Diagnosis not present

## 2019-04-29 DIAGNOSIS — F99 Mental disorder, not otherwise specified: Secondary | ICD-10-CM | POA: Diagnosis present

## 2019-04-29 DIAGNOSIS — J449 Chronic obstructive pulmonary disease, unspecified: Secondary | ICD-10-CM | POA: Diagnosis not present

## 2019-04-29 DIAGNOSIS — R45851 Suicidal ideations: Secondary | ICD-10-CM

## 2019-04-29 DIAGNOSIS — F329 Major depressive disorder, single episode, unspecified: Secondary | ICD-10-CM | POA: Insufficient documentation

## 2019-04-29 DIAGNOSIS — R45850 Homicidal ideations: Secondary | ICD-10-CM | POA: Diagnosis not present

## 2019-04-29 DIAGNOSIS — E039 Hypothyroidism, unspecified: Secondary | ICD-10-CM | POA: Insufficient documentation

## 2019-04-29 DIAGNOSIS — Z79899 Other long term (current) drug therapy: Secondary | ICD-10-CM | POA: Diagnosis not present

## 2019-04-29 DIAGNOSIS — I10 Essential (primary) hypertension: Secondary | ICD-10-CM | POA: Insufficient documentation

## 2019-04-29 DIAGNOSIS — F32A Depression, unspecified: Secondary | ICD-10-CM

## 2019-04-29 DIAGNOSIS — Z87891 Personal history of nicotine dependence: Secondary | ICD-10-CM | POA: Insufficient documentation

## 2019-04-29 HISTORY — DX: Irritable bowel syndrome, unspecified: K58.9

## 2019-04-29 HISTORY — DX: Gastritis, unspecified, without bleeding: K29.70

## 2019-04-29 LAB — CBC
HCT: 39.5 % (ref 36.0–46.0)
Hemoglobin: 13 g/dL (ref 12.0–15.0)
MCH: 30.7 pg (ref 26.0–34.0)
MCHC: 32.9 g/dL (ref 30.0–36.0)
MCV: 93.4 fL (ref 80.0–100.0)
Platelets: 384 10*3/uL (ref 150–400)
RBC: 4.23 MIL/uL (ref 3.87–5.11)
RDW: 14.9 % (ref 11.5–15.5)
WBC: 10.9 10*3/uL — ABNORMAL HIGH (ref 4.0–10.5)
nRBC: 0 % (ref 0.0–0.2)

## 2019-04-29 LAB — URINE DRUG SCREEN, QUALITATIVE (ARMC ONLY)
Amphetamines, Ur Screen: POSITIVE — AB
Barbiturates, Ur Screen: NOT DETECTED
Benzodiazepine, Ur Scrn: NOT DETECTED
Cannabinoid 50 Ng, Ur ~~LOC~~: NOT DETECTED
Cocaine Metabolite,Ur ~~LOC~~: NOT DETECTED
MDMA (Ecstasy)Ur Screen: NOT DETECTED
Methadone Scn, Ur: NOT DETECTED
Opiate, Ur Screen: NOT DETECTED
Phencyclidine (PCP) Ur S: NOT DETECTED
Tricyclic, Ur Screen: NOT DETECTED

## 2019-04-29 LAB — ACETAMINOPHEN LEVEL: Acetaminophen (Tylenol), Serum: <10 ug/mL — ABNORMAL LOW (ref 10–30)

## 2019-04-29 LAB — COMPREHENSIVE METABOLIC PANEL
ALT: 14 U/L (ref 0–44)
AST: 20 U/L (ref 15–41)
Albumin: 4 g/dL (ref 3.5–5.0)
Alkaline Phosphatase: 65 U/L (ref 38–126)
Anion gap: 11 (ref 5–15)
BUN: 5 mg/dL — ABNORMAL LOW (ref 6–20)
CO2: 27 mmol/L (ref 22–32)
Calcium: 9.3 mg/dL (ref 8.9–10.3)
Chloride: 107 mmol/L (ref 98–111)
Creatinine, Ser: 0.88 mg/dL (ref 0.44–1.00)
GFR calc Af Amer: >60 mL/min (ref >60)
GFR calc non Af Amer: >60 mL/min (ref >60)
Glucose, Bld: 99 mg/dL (ref 70–99)
Potassium: 4.4 mmol/L (ref 3.5–5.1)
Sodium: 145 mmol/L (ref 135–145)
Total Bilirubin: 0.4 mg/dL (ref 0.3–1.2)
Total Protein: 7.1 g/dL (ref 6.5–8.1)

## 2019-04-29 LAB — RESPIRATORY PANEL BY RT PCR (FLU A&B, COVID)
Influenza A by PCR: NEGATIVE
Influenza B by PCR: NEGATIVE
SARS Coronavirus 2 by RT PCR: NEGATIVE

## 2019-04-29 LAB — ETHANOL: Alcohol, Ethyl (B): <10 mg/dL (ref <10)

## 2019-04-29 LAB — SALICYLATE LEVEL: Salicylate Lvl: <7 mg/dL — ABNORMAL LOW (ref 7.0–30.0)

## 2019-04-29 MED ORDER — NICOTINE 21 MG/24HR TD PT24
21.0000 mg | MEDICATED_PATCH | Freq: Once | TRANSDERMAL | Status: DC
  Administered 2019-04-29: 21 mg via TRANSDERMAL
  Filled 2019-04-29: qty 1
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
## 2019-04-29 NOTE — ED Notes (Signed)
Hourly rounding reveals patient sleeping in room. No complaints, stable, in no acute distress. Q15 minute rounds and monitoring via Rover and Officer to continue.  

## 2019-04-29 NOTE — ED Triage Notes (Signed)
Pt here for SI/HI. Reports plan of cutting throat.  Wanting to hurt other "irritable" people at boarding house. Voluntary. NAD. ambulatory

## 2019-04-29 NOTE — ED Notes (Signed)
Hourly rounding reveals patient awake in room. No complaints, stable, in no acute distress. Q15 minute rounds and monitoring via Rover and Officer to continue.  
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
## 2019-04-29 NOTE — ED Notes (Addendum)
Patient says been on psychiatric medications for a month and been trying to get in contact with [facility] and no response. Patient also states that she is day 2 off of Opiods and has been abusing them for a couple weeks, she used to have a prescription and is now paying for them.  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

## 2019-04-29 NOTE — ED Notes (Signed)
Report to include Situation, Background, Assessment, and Recommendations received from [facility]. Patient alert and oriented, warm and dry, in no acute distress. Patient denies SI, VH and pain but does state she has AH without command HI towards people at the [facility]. Patient made aware of Q15 minute rounds and Psychologist, counselling presence for their safety. Patient instructed to come to me with needs or concerns.

## 2019-04-29 NOTE — ED Notes (Signed)
Pt dressed into burgundy scrubs per protocol. Pts belongings include: black shirt, black bra, underwear, jeans, pair of socks, pair of shoes, pink purse with miscellaneous items, and a cell phone. Pt refusing to remove bellybutton piercing due to the possibility of it closing up. Pts belongings properly labeled and handed off to receiving rn.

## 2019-04-29 NOTE — ED Notes (Addendum)
Removed two toe rings silver plated from patient, placed in patient belongings

## 2019-04-29 NOTE — ED Triage Notes (Signed)
FIRST NURSE NOTE- here for SI/HI.  NAD. voluntary

## 2019-04-30 DIAGNOSIS — R45851 Suicidal ideations: Secondary | ICD-10-CM | POA: Diagnosis not present

## 2019-04-30 DIAGNOSIS — F329 Major depressive disorder, single episode, unspecified: Secondary | ICD-10-CM

## 2019-04-30 DIAGNOSIS — R45850 Homicidal ideations: Secondary | ICD-10-CM | POA: Diagnosis not present

## 2019-04-30 LAB — PREGNANCY, URINE: Preg Test, Ur: NEGATIVE

## 2019-04-30 NOTE — BH Assessment (Signed)
Patient has been accepted to [facility]  Patient assigned to [facility] Accepting physician is Dr. Darlys Gales.  Call report to [phone].  Representative was [employer].   ER Staff is aware of it:  Ronnie, ER Secretary  Dr. Marisa Severin, ER MD  Morrie Sheldon, Patient's Nurse     Address: [address],  [place], [place] [address]   Spoke with patient about going to [facility] and she's agreement with the plan and will sign in voluntary.

## 2019-04-30 NOTE — ED Notes (Signed)
Pt signed paper copy of transport consent and placed in medical records bin

## 2019-04-30 NOTE — ED Notes (Signed)
Hourly rounding reveals patient sleeping in room. No complaints, stable, in no acute distress. Q15 minute rounds and monitoring via Rover and Officer to continue.  

## 2019-04-30 NOTE — ED Provider Notes (Signed)
Emergency Medicine Observation Re-evaluation Note  Kirsten Richardson is a 44 y.o. female, seen on rounds today.  Pt initially presented to the ED for complaints of Suicidal Currently, the patient is resting in no acute distress.  Physical Exam  BP 103/72 (BP Location: Right Arm)   Pulse 83   Temp 98.6 F (37 C) (Oral)   Resp 18   Ht 4\' 11"  (1.499 m)   Wt 84.4 kg   SpO2 94%   BMI 37.57 kg/m  Physical Exam  ED Course / MDM  EKG:    I have reviewed the labs performed to date as well as medications administered while in observation.  No events overnight.   Plan  Current plan is for psychiatric disposition. Patient is not under full IVC at this time.   , MD 04/30/19 [phone]
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
## 2019-04-30 NOTE — ED Notes (Signed)
Referral information for Psychiatric Hospitalization faxed to;   Marland Kitchen [place] [phone]),   . [facility] [phone]- [phone]) [phone]  . [place] ([phone]---[phone]),  . [place] [phone] or [phone])  . [facility] [phone]),   . [facility] [phone] -or- [phone]),   . [facility] [phone])

## 2019-04-30 NOTE — ED Notes (Signed)
Pt changed into blue paper scrubs at this time.

## 2019-05-23 ENCOUNTER — Other Ambulatory Visit: Payer: Self-pay

## 2019-05-23 ENCOUNTER — Emergency Department: Payer: Medicare Other

## 2019-05-23 ENCOUNTER — Emergency Department
Admission: EM | Admit: 2019-05-23 | Discharge: 2019-05-23 | Disposition: A | Discharge status: Home / Self Care | Payer: Medicare Other | Attending: Emergency Medicine | Admitting: Emergency Medicine

## 2019-05-23 DIAGNOSIS — J449 Chronic obstructive pulmonary disease, unspecified: Secondary | ICD-10-CM | POA: Diagnosis not present

## 2019-05-23 DIAGNOSIS — Z87891 Personal history of nicotine dependence: Secondary | ICD-10-CM | POA: Insufficient documentation

## 2019-05-23 DIAGNOSIS — K5900 Constipation, unspecified: Secondary | ICD-10-CM | POA: Insufficient documentation

## 2019-05-23 DIAGNOSIS — Z79899 Other long term (current) drug therapy: Secondary | ICD-10-CM | POA: Insufficient documentation

## 2019-05-23 DIAGNOSIS — E039 Hypothyroidism, unspecified: Secondary | ICD-10-CM | POA: Insufficient documentation

## 2019-05-23 DIAGNOSIS — R112 Nausea with vomiting, unspecified: Secondary | ICD-10-CM | POA: Diagnosis not present

## 2019-05-23 DIAGNOSIS — R109 Unspecified abdominal pain: Secondary | ICD-10-CM | POA: Diagnosis present

## 2019-05-23 DIAGNOSIS — I10 Essential (primary) hypertension: Secondary | ICD-10-CM | POA: Insufficient documentation

## 2019-05-23 LAB — LIPASE, BLOOD: Lipase: 26 U/L (ref 11–51)

## 2019-05-23 LAB — COMPREHENSIVE METABOLIC PANEL
ALT: 17 U/L (ref 0–44)
AST: 17 U/L (ref 15–41)
Albumin: 4.2 g/dL (ref 3.5–5.0)
Alkaline Phosphatase: 74 U/L (ref 38–126)
Anion gap: 8 (ref 5–15)
BUN: 8 mg/dL (ref 6–20)
CO2: 27 mmol/L (ref 22–32)
Calcium: 9.4 mg/dL (ref 8.9–10.3)
Chloride: 98 mmol/L (ref 98–111)
Creatinine, Ser: 0.77 mg/dL (ref 0.44–1.00)
GFR calc Af Amer: >60 mL/min (ref >60)
GFR calc non Af Amer: >60 mL/min (ref >60)
Glucose, Bld: 90 mg/dL (ref 70–99)
Potassium: 5 mmol/L (ref 3.5–5.1)
Sodium: 133 mmol/L — ABNORMAL LOW (ref 135–145)
Total Bilirubin: 0.6 mg/dL (ref 0.3–1.2)
Total Protein: 7.7 g/dL (ref 6.5–8.1)

## 2019-05-23 LAB — CBC
HCT: 40.7 % (ref 36.0–46.0)
Hemoglobin: 13.5 g/dL (ref 12.0–15.0)
MCH: 29.9 pg (ref 26.0–34.0)
MCHC: 33.2 g/dL (ref 30.0–36.0)
MCV: 90 fL (ref 80.0–100.0)
Platelets: 485 10*3/uL — ABNORMAL HIGH (ref 150–400)
RBC: 4.52 MIL/uL (ref 3.87–5.11)
RDW: 13.5 % (ref 11.5–15.5)
WBC: 8.6 10*3/uL (ref 4.0–10.5)
nRBC: 0 % (ref 0.0–0.2)

## 2019-05-23 LAB — URINALYSIS, COMPLETE (UACMP) WITH MICROSCOPIC
Bacteria, UA: NONE SEEN
Bilirubin Urine: NEGATIVE
Glucose, UA: NEGATIVE mg/dL
Hgb urine dipstick: NEGATIVE
Ketones, ur: NEGATIVE mg/dL
Leukocytes,Ua: NEGATIVE
Nitrite: NEGATIVE
Protein, ur: NEGATIVE mg/dL
Specific Gravity, Urine: 1.012 (ref 1.005–1.030)
pH: 7 (ref 5.0–8.0)

## 2019-05-23 LAB — PREGNANCY, URINE: Preg Test, Ur: NEGATIVE

## 2019-05-23 MED ORDER — POLYETHYLENE GLYCOL 3350 17 GM/SCOOP PO POWD
17.0000 g | Freq: Every day | ORAL | 0 refills | Status: DC | PRN
Start: 2019-05-23 — End: 2020-02-28

## 2019-05-23 MED ORDER — SODIUM CHLORIDE 0.9% FLUSH: 3.0000 mL | Freq: Once | INTRAVENOUS | Status: DC

## 2019-05-23 MED ORDER — METOCLOPRAMIDE HCL 10 MG PO TABS
20.0000 mg | ORAL_TABLET | Freq: Once | ORAL | Status: AC
  Administered 2019-05-23: 20 mg via ORAL
  Filled 2019-05-23: qty 2

## 2019-05-23 MED ORDER — METOCLOPRAMIDE HCL 10 MG PO TABS
10.0000 mg | ORAL_TABLET | Freq: Four times a day (QID) | ORAL | 0 refills | Status: DC | PRN
Start: 2019-05-23 — End: 2019-11-16
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
## 2019-05-23 NOTE — ED Triage Notes (Signed)
Pt states she has a hx of IBS and states she had 2-3 days of diarrhea and for the past 5 days having N/V and constipation. Pt is in NAD at present.

## 2019-05-24 LAB — POCT PREGNANCY, URINE: Preg Test, Ur: NEGATIVE

## 2019-05-26 ENCOUNTER — Encounter: Payer: Self-pay | Admitting: Emergency Medicine

## 2019-05-26 ENCOUNTER — Other Ambulatory Visit: Payer: Self-pay

## 2019-05-26 ENCOUNTER — Emergency Department: Payer: Medicare Other

## 2019-05-26 ENCOUNTER — Emergency Department
Admission: EM | Admit: 2019-05-26 | Discharge: 2019-05-26 | Disposition: A | Discharge status: Home / Self Care | Payer: Medicare Other | Attending: Emergency Medicine | Admitting: Emergency Medicine

## 2019-05-26 DIAGNOSIS — I10 Essential (primary) hypertension: Secondary | ICD-10-CM | POA: Insufficient documentation

## 2019-05-26 DIAGNOSIS — R109 Unspecified abdominal pain: Secondary | ICD-10-CM | POA: Insufficient documentation

## 2019-05-26 DIAGNOSIS — R112 Nausea with vomiting, unspecified: Secondary | ICD-10-CM | POA: Insufficient documentation

## 2019-05-26 DIAGNOSIS — Z87891 Personal history of nicotine dependence: Secondary | ICD-10-CM | POA: Insufficient documentation

## 2019-05-26 DIAGNOSIS — E039 Hypothyroidism, unspecified: Secondary | ICD-10-CM | POA: Diagnosis not present

## 2019-05-26 DIAGNOSIS — J449 Chronic obstructive pulmonary disease, unspecified: Secondary | ICD-10-CM | POA: Diagnosis not present

## 2019-05-26 DIAGNOSIS — Z79899 Other long term (current) drug therapy: Secondary | ICD-10-CM | POA: Insufficient documentation

## 2019-05-26 DIAGNOSIS — K5900 Constipation, unspecified: Secondary | ICD-10-CM | POA: Diagnosis present

## 2019-05-26 LAB — COMPREHENSIVE METABOLIC PANEL
ALT: 14 U/L (ref 0–44)
AST: 20 U/L (ref 15–41)
Albumin: 4 g/dL (ref 3.5–5.0)
Alkaline Phosphatase: 67 U/L (ref 38–126)
Anion gap: 8 (ref 5–15)
BUN: 8 mg/dL (ref 6–20)
CO2: 28 mmol/L (ref 22–32)
Calcium: 9.1 mg/dL (ref 8.9–10.3)
Chloride: 99 mmol/L (ref 98–111)
Creatinine, Ser: 0.76 mg/dL (ref 0.44–1.00)
GFR calc Af Amer: >60 mL/min (ref >60)
GFR calc non Af Amer: >60 mL/min (ref >60)
Glucose, Bld: 103 mg/dL — ABNORMAL HIGH (ref 70–99)
Potassium: 5.2 mmol/L — ABNORMAL HIGH (ref 3.5–5.1)
Sodium: 135 mmol/L (ref 135–145)
Total Bilirubin: 0.4 mg/dL (ref 0.3–1.2)
Total Protein: 7.2 g/dL (ref 6.5–8.1)

## 2019-05-26 LAB — URINALYSIS, COMPLETE (UACMP) WITH MICROSCOPIC
Bacteria, UA: NONE SEEN
Bilirubin Urine: NEGATIVE
Glucose, UA: NEGATIVE mg/dL
Hgb urine dipstick: NEGATIVE
Ketones, ur: NEGATIVE mg/dL
Nitrite: NEGATIVE
Protein, ur: NEGATIVE mg/dL
Specific Gravity, Urine: 1.01 (ref 1.005–1.030)
pH: 6 (ref 5.0–8.0)

## 2019-05-26 LAB — CBC
HCT: 37.9 % (ref 36.0–46.0)
Hemoglobin: 12.8 g/dL (ref 12.0–15.0)
MCH: 29.9 pg (ref 26.0–34.0)
MCHC: 33.8 g/dL (ref 30.0–36.0)
MCV: 88.6 fL (ref 80.0–100.0)
Platelets: 407 10*3/uL — ABNORMAL HIGH (ref 150–400)
RBC: 4.28 MIL/uL (ref 3.87–5.11)
RDW: 13.7 % (ref 11.5–15.5)
WBC: 9.1 10*3/uL (ref 4.0–10.5)
nRBC: 0 % (ref 0.0–0.2)

## 2019-05-26 LAB — LIPASE, BLOOD: Lipase: 27 U/L (ref 11–51)

## 2019-05-26 MED ORDER — IOHEXOL 300 MG/ML  SOLN
100.0000 mL | Freq: Once | INTRAMUSCULAR | Status: AC | PRN
  Administered 2019-05-26: 100 mL via INTRAVENOUS
  Filled 2019-05-26: qty 100

## 2019-05-26 MED ORDER — ONDANSETRON HCL 4 MG/2ML IJ SOLN
4.0000 mg | Freq: Once | INTRAMUSCULAR | Status: AC
  Administered 2019-05-26: 4 mg via INTRAVENOUS
  Filled 2019-05-26: qty 2

## 2019-05-26 MED ORDER — ONDANSETRON 4 MG PO TBDP: 4.0000 mg | ORAL_TABLET | Freq: Three times a day (TID) | ORAL | 0 refills | Status: DC | PRN

## 2019-05-26 NOTE — ED Notes (Signed)
Pt tolerating PO food and liquid with no N/V. Pt states she is not nauseated at present.

## 2019-05-26 NOTE — ED Triage Notes (Signed)
Patient states she was seen here Monday for constipation. Reports she was given reglan and has been taking a laxative and still has not had a bowel movement. Having nausea and vomiting as well.

## 2019-05-26 NOTE — ED Notes (Addendum)
Pt with c/o nausea, EDP notitifed, see MAR. Pt given ginger ale and crackers for po challenge per EDP

## 2019-05-26 NOTE — Discharge Instructions (Addendum)
Your CT scan was negative.  I suspect she could have some gastroparesis given the vomiting occurring after eating.  You should discuss this with your GI doctor.  She can trial the Zofran and Reglan to help keep food down but there is no signs of severe dehydration or abnormalities on your CT scan at this time.  Return to the ER for worsening pain, vomiting or any other concerns   IMPRESSION: 1. No acute process in the abdomen or pelvis
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
## 2019-11-04 ENCOUNTER — Other Ambulatory Visit: Admission: RE | Admit: 2019-11-04 | Payer: Medicare Other | Source: Ambulatory Visit

## 2019-11-10 ENCOUNTER — Encounter: Payer: Self-pay | Admitting: Emergency Medicine

## 2019-11-10 ENCOUNTER — Other Ambulatory Visit: Payer: Self-pay

## 2019-11-10 ENCOUNTER — Emergency Department
Admission: EM | Admit: 2019-11-10 | Discharge: 2019-11-10 | Disposition: A | Discharge status: Home / Self Care | Payer: Medicare Other | Attending: Student in an Organized Health Care Education/Training Program | Admitting: Student in an Organized Health Care Education/Training Program

## 2019-11-10 ENCOUNTER — Emergency Department: Payer: Medicare Other

## 2019-11-10 DIAGNOSIS — R300 Dysuria: Secondary | ICD-10-CM | POA: Diagnosis not present

## 2019-11-10 DIAGNOSIS — Z87891 Personal history of nicotine dependence: Secondary | ICD-10-CM | POA: Diagnosis not present

## 2019-11-10 DIAGNOSIS — J449 Chronic obstructive pulmonary disease, unspecified: Secondary | ICD-10-CM | POA: Insufficient documentation

## 2019-11-10 DIAGNOSIS — Z79899 Other long term (current) drug therapy: Secondary | ICD-10-CM | POA: Diagnosis not present

## 2019-11-10 DIAGNOSIS — I10 Essential (primary) hypertension: Secondary | ICD-10-CM | POA: Diagnosis not present

## 2019-11-10 DIAGNOSIS — R109 Unspecified abdominal pain: Secondary | ICD-10-CM | POA: Insufficient documentation

## 2019-11-10 DIAGNOSIS — E039 Hypothyroidism, unspecified: Secondary | ICD-10-CM | POA: Insufficient documentation

## 2019-11-10 LAB — HEPATIC FUNCTION PANEL
ALT: 19 U/L (ref 0–44)
AST: 19 U/L (ref 15–41)
Albumin: 3.9 g/dL (ref 3.5–5.0)
Alkaline Phosphatase: 69 U/L (ref 38–126)
Bilirubin, Direct: <0.1 mg/dL (ref 0.0–0.2)
Total Bilirubin: 0.4 mg/dL (ref 0.3–1.2)
Total Protein: 6.8 g/dL (ref 6.5–8.1)

## 2019-11-10 LAB — URINALYSIS, COMPLETE (UACMP) WITH MICROSCOPIC: Specific Gravity, Urine: 1.02 (ref 1.005–1.030)

## 2019-11-10 LAB — BASIC METABOLIC PANEL
Anion gap: 10 (ref 5–15)
BUN: 13 mg/dL (ref 6–20)
CO2: 28 mmol/L (ref 22–32)
Calcium: 8.9 mg/dL (ref 8.9–10.3)
Chloride: 103 mmol/L (ref 98–111)
Creatinine, Ser: 0.99 mg/dL (ref 0.44–1.00)
GFR, Estimated: >60 mL/min (ref >60)
Glucose, Bld: 86 mg/dL (ref 70–99)
Potassium: 4.4 mmol/L (ref 3.5–5.1)
Sodium: 141 mmol/L (ref 135–145)

## 2019-11-10 LAB — POC URINE PREG, ED: Preg Test, Ur: NEGATIVE

## 2019-11-10 LAB — CBC
HCT: 35.7 % — ABNORMAL LOW (ref 36.0–46.0)
Hemoglobin: 11.5 g/dL — ABNORMAL LOW (ref 12.0–15.0)
MCH: 31 pg (ref 26.0–34.0)
MCHC: 32.2 g/dL (ref 30.0–36.0)
MCV: 96.2 fL (ref 80.0–100.0)
Platelets: 405 10*3/uL — ABNORMAL HIGH (ref 150–400)
RBC: 3.71 MIL/uL — ABNORMAL LOW (ref 3.87–5.11)
RDW: 14.9 % (ref 11.5–15.5)
WBC: 7.3 10*3/uL (ref 4.0–10.5)
nRBC: 0 % (ref 0.0–0.2)

## 2019-11-10 LAB — LIPASE, BLOOD: Lipase: 37 U/L (ref 11–51)

## 2019-11-10 MED ORDER — CEPHALEXIN 500 MG PO CAPS
500.0000 mg | ORAL_CAPSULE | Freq: Three times a day (TID) | ORAL | 0 refills | Status: AC
Start: 2019-11-10 — End: 2019-11-17

## 2019-11-10 NOTE — ED Triage Notes (Signed)
C/O low back pain intermittently x 1 week.  Describes urinary urgency.  Has taken AZO for symptoms.  Patient has history of UTI.

## 2019-11-10 NOTE — ED Notes (Signed)
Lab contacted about add on blood.
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
## 2019-11-10 NOTE — ED Notes (Signed)
Rainbow was sent to lab. 

## 2019-11-10 NOTE — ED Notes (Signed)
Patient states she has been taking AZO 3 days for kidney pain.

## 2019-11-16 ENCOUNTER — Ambulatory Visit (INDEPENDENT_AMBULATORY_CARE_PROVIDER_SITE_OTHER): Payer: Medicare Other | Admitting: Urology

## 2019-11-16 ENCOUNTER — Other Ambulatory Visit: Payer: Self-pay

## 2019-11-16 ENCOUNTER — Encounter: Payer: Self-pay | Admitting: Urology

## 2019-11-16 VITALS — BP 124/70 | HR 103 | Ht 59.0 in | Wt 194.0 lb

## 2019-11-16 DIAGNOSIS — N390 Urinary tract infection, site not specified: Secondary | ICD-10-CM

## 2019-11-16 DIAGNOSIS — R102 Pelvic and perineal pain: Secondary | ICD-10-CM | POA: Diagnosis not present

## 2019-11-16 DIAGNOSIS — N200 Calculus of kidney: Secondary | ICD-10-CM | POA: Diagnosis not present

## 2019-11-16 LAB — BLADDER SCAN AMB NON-IMAGING
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
## 2019-11-17 LAB — MICROSCOPIC EXAMINATION: Epithelial Cells (non renal): >10 /hpf — AB (ref 0–10)

## 2019-11-17 LAB — URINALYSIS, COMPLETE
Specific Gravity, UA: <1.005 — ABNORMAL LOW (ref 1.005–1.030)
pH, UA: 5 (ref 5.0–7.5)

## 2019-11-24 ENCOUNTER — Other Ambulatory Visit: Payer: Self-pay

## 2019-11-24 ENCOUNTER — Other Ambulatory Visit
Admission: RE | Admit: 2019-11-24 | Discharge: 2019-11-24 | Disposition: A | Discharge status: Home / Self Care | Payer: Medicare Other | Source: Ambulatory Visit | Attending: Gastroenterology | Admitting: Gastroenterology

## 2019-11-24 DIAGNOSIS — Z20822 Contact with and (suspected) exposure to covid-19: Secondary | ICD-10-CM | POA: Insufficient documentation

## 2019-11-24 DIAGNOSIS — Z01812 Encounter for preprocedural laboratory examination: Secondary | ICD-10-CM | POA: Insufficient documentation

## 2019-11-25 ENCOUNTER — Encounter: Payer: Self-pay | Admitting: *Deleted

## 2019-11-25 LAB — SARS CORONAVIRUS 2 (TAT 6-24 HRS): SARS Coronavirus 2: NEGATIVE

## 2019-11-28 ENCOUNTER — Encounter: Payer: Self-pay | Admitting: *Deleted

## 2019-11-28 ENCOUNTER — Ambulatory Visit: Payer: Medicare Other | Admitting: Anesthesiology

## 2019-11-28 ENCOUNTER — Encounter
Admission: RE | Disposition: A | Discharge status: Home / Self Care | Payer: Self-pay | Source: Home / Self Care | Attending: Gastroenterology

## 2019-11-28 ENCOUNTER — Ambulatory Visit
Admission: RE | Admit: 2019-11-28 | Discharge: 2019-11-28 | Disposition: A | Discharge status: Home / Self Care | Payer: Medicare Other | Attending: Gastroenterology | Admitting: Gastroenterology

## 2019-11-28 DIAGNOSIS — K219 Gastro-esophageal reflux disease without esophagitis: Secondary | ICD-10-CM | POA: Diagnosis not present

## 2019-11-28 DIAGNOSIS — Z79890 Hormone replacement therapy (postmenopausal): Secondary | ICD-10-CM | POA: Insufficient documentation

## 2019-11-28 DIAGNOSIS — Z7951 Long term (current) use of inhaled steroids: Secondary | ICD-10-CM | POA: Diagnosis not present

## 2019-11-28 DIAGNOSIS — Z79899 Other long term (current) drug therapy: Secondary | ICD-10-CM | POA: Diagnosis not present

## 2019-11-28 DIAGNOSIS — K921 Melena: Secondary | ICD-10-CM | POA: Diagnosis not present

## 2019-11-28 DIAGNOSIS — Z8711 Personal history of peptic ulcer disease: Secondary | ICD-10-CM | POA: Insufficient documentation

## 2019-11-28 DIAGNOSIS — K2950 Unspecified chronic gastritis without bleeding: Secondary | ICD-10-CM | POA: Diagnosis not present

## 2019-11-28 DIAGNOSIS — R1033 Periumbilical pain: Secondary | ICD-10-CM | POA: Diagnosis not present

## 2019-11-28 DIAGNOSIS — K449 Diaphragmatic hernia without obstruction or gangrene: Secondary | ICD-10-CM | POA: Diagnosis not present

## 2019-11-28 HISTORY — DX: Other forms of stomatitis: K12.1

## 2019-11-28 HISTORY — DX: Anxiety disorder, unspecified: F41.9

## 2019-11-28 HISTORY — PX: ESOPHAGOGASTRODUODENOSCOPY (EGD) WITH PROPOFOL: SHX5813

## 2019-11-28 LAB — POCT PREGNANCY, URINE: Preg Test, Ur: NEGATIVE

## 2019-11-28 SURGERY — ESOPHAGOGASTRODUODENOSCOPY (EGD) WITH PROPOFOL
Anesthesia: General

## 2019-11-28 MED ORDER — SODIUM CHLORIDE 0.9 % IV SOLN: INTRAVENOUS | Status: DC | PRN

## 2019-11-28 MED ORDER — PROPOFOL 10 MG/ML IV BOLUS
INTRAVENOUS | Status: DC | PRN
  Administered 2019-11-28: 60 mg via INTRAVENOUS

## 2019-11-28 MED ORDER — PROPOFOL 500 MG/50ML IV EMUL
INTRAVENOUS | Status: DC | PRN
  Administered 2019-11-28: 200 ug/kg/min via INTRAVENOUS

## 2019-11-28 MED ORDER — LIDOCAINE HCL (PF) 2 % IJ SOLN
INTRAMUSCULAR | Status: DC | PRN
  Administered 2019-11-28: 100 mg via INTRADERMAL

## 2019-11-28 NOTE — Transfer of Care (Signed)
Immediate Anesthesia Transfer of Care Note  Patient: Kirsten Richardson  Procedure(s) Performed: ESOPHAGOGASTRODUODENOSCOPY (EGD) WITH PROPOFOL (N/A )  Patient Location: PACU  Anesthesia Type:General  Level of Consciousness: sedated  Airway & Oxygen Therapy: Patient Spontanous Breathing and Patient connected to nasal cannula oxygen  Post-op Assessment: Report given to RN and Post -op Vital signs reviewed and stable  Post vital signs: Reviewed and stable  Last Vitals:  Vitals Value Taken Time  BP    Temp    Pulse 88 11/28/19 0935  Resp 20 11/28/19 0935  SpO2 100 % 11/28/19 0935  Vitals shown include unvalidated device data.  Last Pain:  Vitals:   11/28/19 0833  TempSrc: Tympanic  PainSc: 6       Patients Stated Pain Goal: 3 (11/28/19 2094)  Complications: No complications documented.
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
## 2019-11-28 NOTE — Addendum Note (Signed)
Addendum  created 11/28/19 1101 by Junious Silk, CRNA   Intraprocedure Event edited

## 2019-11-28 NOTE — Op Note (Signed)
[facility] Gastroenterology Patient Name: Kirsten Richardson Procedure Date: 11/28/2019 9:17 AM MRN: [id number] Account #: [id number] Date of Birth: [birth year] Admit Type: Outpatient Age: 44 Room: [facility] ENDO ROOM 3 Gender: Female Note Status: Finalized Procedure:             Upper GI endoscopy Indications:           Periumbilical abdominal pain, Melena Providers:             Andrey Farmer MD, MD Medicines:             Monitored Anesthesia Care Complications:         No immediate complications. Estimated blood loss:                         Minimal. Procedure:             Pre-Anesthesia Assessment:                        - Prior to the procedure, a History and Physical was                         performed, and patient medications and allergies were                         reviewed. The patient is competent. The risks and                         benefits of the procedure and the sedation options and                         risks were discussed with the patient. All questions                         were answered and informed consent was obtained.                         Patient identification and proposed procedure were                         verified by the physician, the nurse, the anesthetist                         and the technician in the endoscopy suite. Mental                         Status Examination: alert and oriented. Airway                         Examination: normal oropharyngeal airway and neck                         mobility. Respiratory Examination: clear to                         auscultation. CV Examination: normal. Prophylactic                         Antibiotics: The patient does not require prophylactic  antibiotics. Prior Anticoagulants: The patient has                         taken no previous anticoagulant or antiplatelet                         agents. ASA Grade Assessment: III - A patient with                          severe systemic disease. After reviewing the risks and                         benefits, the patient was deemed in satisfactory                         condition to undergo the procedure. The anesthesia                         plan was to use monitored anesthesia care (MAC).                         Immediately prior to administration of medications,                         the patient was re-assessed for adequacy to receive                         sedatives. The heart rate, respiratory rate, oxygen                         saturations, blood pressure, adequacy of pulmonary                         ventilation, and response to care were monitored                         throughout the procedure. The physical status of the                         patient was re-assessed after the procedure.                        After obtaining informed consent, the endoscope was                         passed under direct vision. Throughout the procedure,                         the patient's blood pressure, pulse, and oxygen                         saturations were monitored continuously. The Endoscope                         was introduced through the mouth, and advanced to the                         second part of duodenum. The upper GI endoscopy was  accomplished without difficulty. The patient tolerated                         the procedure well. Findings:      A small hiatal hernia was present.      The exam of the esophagus was otherwise normal.      Scattered mild inflammation characterized by erythema was found in the       gastric antrum. Biopsies were taken with a cold forceps for Helicobacter       pylori testing. Estimated blood loss was minimal.      The examined duodenum was normal. Impression:            - Small hiatal hernia.                        - Gastritis. Biopsied.                        - Normal examined duodenum. Recommendation:        - Discharge patient  to home.                        - Resume previous diet.                        - Continue present medications.                        - Await pathology results.                        - Return to referring physician as previously                         scheduled. Procedure Code(s):     --- Professional ---                        [phone], Esophagogastroduodenoscopy, flexible,                         transoral; with biopsy, single or multiple Diagnosis Code(s):     --- Professional ---                        K44.9, Diaphragmatic hernia without obstruction or                         gangrene                        K29.70, Gastritis, unspecified, without bleeding                        W09.81, Periumbilical pain                        K92.1, Melena (includes Hematochezia) CPT copyright 2019 American Medical Association. All rights reserved. The codes documented in this report are preliminary and upon coder review may  be revised to meet current compliance requirements. Andrey Farmer, MD Andrey Farmer MD, MD 11/28/2019 9:35:26 AM Number of Addenda: 0 Note Initiated On: 11/28/2019 9:17 AM Estimated Blood Loss:  Estimated blood loss was minimal.  [facility]

## 2019-11-28 NOTE — Anesthesia Postprocedure Evaluation (Signed)
Anesthesia Post Note  Patient: Kirsten Richardson  Procedure(s) Performed: ESOPHAGOGASTRODUODENOSCOPY (EGD) WITH PROPOFOL (N/A )  Patient location during evaluation: PACU Anesthesia Type: General Level of consciousness: awake and alert Pain management: pain level controlled Vital Signs Assessment: post-procedure vital signs reviewed and stable Respiratory status: spontaneous breathing, nonlabored ventilation, respiratory function stable and patient connected to nasal cannula oxygen Cardiovascular status: blood pressure returned to baseline and stable Postop Assessment: no apparent nausea or vomiting Anesthetic complications: no   No complications documented.   Last Vitals:  Vitals:   11/28/19 0833 11/28/19 0936  BP: 138/90 110/82  Pulse: 97 88  Resp: 18 20  Temp: 36.4 C 36.6 C  SpO2: 94% 100%    Last Pain:  Vitals:   11/28/19 0936  TempSrc:   PainSc: 4                  Yevette Edwards

## 2019-11-28 NOTE — Anesthesia Procedure Notes (Signed)
Date/Time: 11/28/2019 9:30 AM Performed by: Junious Silk, CRNA Pre-anesthesia Checklist: Patient identified, Emergency Drugs available, Suction available, Patient being monitored and Timeout performed Oxygen Delivery Method: Nasal cannula
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
## 2019-11-28 NOTE — Interval H&P Note (Signed)
History and Physical Interval Note:  11/28/2019 9:21 AM  Kirsten Richardson  has presented today for surgery, with the diagnosis of PERIUMBILICAL PAIN MELENA.  The various methods of treatment have been discussed with the patient and family. After consideration of risks, benefits and other options for treatment, the patient has consented to  Procedure(s): ESOPHAGOGASTRODUODENOSCOPY (EGD) WITH PROPOFOL (N/A) as a surgical intervention.  The patient's history has been reviewed, patient examined, no change in status, stable for surgery.  I have reviewed the patient's chart and labs.  Questions were answered to the patient's satisfaction.     Regis Bill  Ok to proceed with EGD

## 2019-11-29 ENCOUNTER — Ambulatory Visit
Admission: RE | Admit: 2019-11-29 | Discharge: 2019-11-29 | Disposition: A | Discharge status: Home / Self Care | Payer: Medicare Other | Attending: Urology | Admitting: Urology

## 2019-11-29 ENCOUNTER — Other Ambulatory Visit: Payer: Self-pay

## 2019-11-29 ENCOUNTER — Ambulatory Visit
Admission: RE | Admit: 2019-11-29 | Discharge: 2019-11-29 | Disposition: A | Discharge status: Home / Self Care | Payer: Medicare Other | Source: Ambulatory Visit | Attending: Urology | Admitting: Urology

## 2019-11-29 ENCOUNTER — Ambulatory Visit (INDEPENDENT_AMBULATORY_CARE_PROVIDER_SITE_OTHER): Payer: Medicare Other | Admitting: Urology

## 2019-11-29 ENCOUNTER — Encounter: Payer: Self-pay | Admitting: Gastroenterology

## 2019-11-29 VITALS — BP 118/78 | HR 93 | Ht 59.0 in | Wt 196.4 lb

## 2019-11-29 DIAGNOSIS — R102 Pelvic and perineal pain: Secondary | ICD-10-CM

## 2019-11-29 DIAGNOSIS — R350 Frequency of micturition: Secondary | ICD-10-CM

## 2019-11-29 DIAGNOSIS — N390 Urinary tract infection, site not specified: Secondary | ICD-10-CM | POA: Diagnosis not present

## 2019-11-29 DIAGNOSIS — N200 Calculus of kidney: Secondary | ICD-10-CM | POA: Diagnosis present

## 2019-11-29 DIAGNOSIS — R109 Unspecified abdominal pain: Secondary | ICD-10-CM

## 2019-11-29 LAB — URINALYSIS, COMPLETE (UACMP) WITH MICROSCOPIC
Bilirubin Urine: NEGATIVE
Glucose, UA: NEGATIVE mg/dL
Hgb urine dipstick: NEGATIVE
Ketones, ur: NEGATIVE mg/dL
Leukocytes,Ua: NEGATIVE
Nitrite: NEGATIVE
Protein, ur: NEGATIVE mg/dL
RBC / HPF: NONE SEEN RBC/hpf (ref 0–5)
Specific Gravity, Urine: 1.015 (ref 1.005–1.030)
pH: 8.5 — ABNORMAL HIGH (ref 5.0–8.0)

## 2019-11-29 MED ORDER — CEPHALEXIN 500 MG PO CAPS: 500.0000 mg | ORAL_CAPSULE | Freq: Two times a day (BID) | ORAL | 0 refills | Status: DC

## 2019-11-29 NOTE — Patient Instructions (Signed)
Pelvic Pain, Female Pelvic pain is pain in your lower abdomen, below your belly button and between your hips. The pain may start suddenly (be acute), keep coming back (be recurring), or last a long time (become chronic). Pelvic pain that lasts longer than 6 months is considered chronic. Pelvic pain may affect your:  Reproductive organs.  Urinary system.  Digestive tract.  Musculoskeletal system. There are many potential causes of pelvic pain. Sometimes, the pain can be a result of digestive or urinary conditions, strained muscles or ligaments, or reproductive conditions. Sometimes the cause of pelvic pain is not known. Follow these instructions at home:   Take over-the-counter and prescription medicines only as told by your health care provider.  Rest as told by your health care provider.  Do not have sex if it hurts.  Keep a journal of your pelvic pain. Write down: ? When the pain started. ? Where the pain is located. ? What seems to make the pain better or worse, such as food or your period (menstrual cycle). ? Any symptoms you have along with the pain.  Keep all follow-up visits as told by your health care provider. This is important. Contact a health care provider if:  Medicine does not help your pain.  Your pain comes back.  You have new symptoms.  You have abnormal vaginal discharge or bleeding, including bleeding after menopause.  You have a fever or chills.  You are constipated.  You have blood in your urine or stool.  You have foul-smelling urine.  You feel weak or light-headed. Get help right away if:  You have sudden severe pain.  Your pain gets steadily worse.  You have severe pain along with fever, nausea, vomiting, or excessive sweating.  You lose consciousness. Summary  Pelvic pain is pain in your lower abdomen, below your belly button and between your hips.  There are many potential causes of pelvic pain.  Keep a journal of your pelvic  pain. This information is not intended to replace advice given to you by your health care provider. Make sure you discuss any questions you have with your health care provider. Document Revised: 06/10/2017 Document Reviewed: 06/10/2017 Elsevier Patient Education  2020 Elsevier Inc.  

## 2019-11-29 NOTE — Progress Notes (Signed)
   11/29/2019 9:15 AM   Kirsten Richardson 1975-03-23 [id number]  Reason for visit: Follow up kidney pain, urinary symptoms  HPI: Kirsten Richardson was added to my clinic schedule today, I recently saw her just 2 weeks ago. She is a comorbid 44 year old female with a number of medical problems including bipolar disorder, borderline personality disorder, anxiety/depression, IBS, and emphysema. She has been on Myrbetriq long-term for OAB. She was recently seen in the ER with complaints of pelvic pain, " kidney pain," and feeling of incomplete emptying. A CT was benign aside from a nonobstructing right-sided 4 mm lower pole stone, and labs were also benign. Urinalysis at that time was contaminated with squamous cells, and showed no evidence of infection but she was treated with Keflex. She reports her symptoms improved initially after taking the 2 days of Keflex, then she forgot to continue taking it and her symptoms returned. PVR at our clinic visit was normal at 27 mL, and urinalysis was benign. She has trouble with constipation and drinks only ginger ale during the day. At our last visit I recommended finishing the course of Keflex, taking cranberry tablets for UTI prevention, managing constipation better, continuing Myrbetriq for OAB symptoms, and avoiding sodas in the diet.  Her primary complaint today is recurrent kidney pain that she describes worse on the left side than the right, as well as feeling of incomplete bladder emptying despite normal PVR. Urinalysis today is also relatively benign with rare bacteria, 0 RBCs, 0-5 WBCs, 0-5 squamous cells, no leukocytes, nitrite negative.  I personally viewed and interpreted her recent CT that shows no hydronephrosis or other abdominal pathology aside from mild constipation.  I had a very frank conversation with the patient that I feel strongly that her symptoms are not related to her urinary tract in the setting of multiple negative urinalyses, and benign CT. I  recommended working on the constipation and decreasing sodas in the diet. She is adamant that her kidney pain is related to infection and she is convinced that antibiotics helped her previously. Since she has not had a urine culture, I was willing to try 1 more short course of Keflex since she is convinced this improves her symptoms previously. We discussed though that if her urine culture is negative today, and she continues to have the symptoms, we need to think about other etiologies besides the kidneys and urologic tract as the source of her pain. I also think that her extensive psychiatric history plays a role in her chronic pain. She may benefit from pelvic floor physical therapy in the future.  -Follow-up urine culture, call with results -Trial of Keflex x5 days, though low suspicion for infection -Need to think about other etiologies for her chronic pain and pelvic symptoms in the setting of multiple negative urinalyses and normal CT   Sondra Come, MD  [facility] [address] [place], [place] [address] [phone]

## 2019-11-30 LAB — SURGICAL PATHOLOGY

## 2019-12-22 ENCOUNTER — Emergency Department
Admission: EM | Admit: 2019-12-22 | Discharge: 2019-12-22 | Disposition: A | Discharge status: Home / Self Care | Payer: Medicare Other | Attending: Emergency Medicine | Admitting: Emergency Medicine

## 2019-12-22 ENCOUNTER — Encounter: Payer: Self-pay | Admitting: *Deleted

## 2019-12-22 ENCOUNTER — Other Ambulatory Visit: Payer: Self-pay

## 2019-12-22 DIAGNOSIS — N200 Calculus of kidney: Secondary | ICD-10-CM | POA: Insufficient documentation

## 2019-12-22 DIAGNOSIS — Z5321 Procedure and treatment not carried out due to patient leaving prior to being seen by health care provider: Secondary | ICD-10-CM | POA: Insufficient documentation

## 2019-12-22 DIAGNOSIS — F1123 Opioid dependence with withdrawal: Secondary | ICD-10-CM | POA: Insufficient documentation

## 2019-12-22 LAB — COMPREHENSIVE METABOLIC PANEL
ALT: 29 U/L (ref 0–44)
AST: 21 U/L (ref 15–41)
Albumin: 4.5 g/dL (ref 3.5–5.0)
Alkaline Phosphatase: 60 U/L (ref 38–126)
Anion gap: 9 (ref 5–15)
BUN: 12 mg/dL (ref 6–20)
CO2: 20 mmol/L — ABNORMAL LOW (ref 22–32)
Calcium: 9.4 mg/dL (ref 8.9–10.3)
Chloride: 109 mmol/L (ref 98–111)
Creatinine, Ser: 0.97 mg/dL (ref 0.44–1.00)
GFR, Estimated: >60 mL/min (ref >60)
Glucose, Bld: 95 mg/dL (ref 70–99)
Potassium: 4.7 mmol/L (ref 3.5–5.1)
Sodium: 138 mmol/L (ref 135–145)
Total Bilirubin: 0.7 mg/dL (ref 0.3–1.2)
Total Protein: 7.5 g/dL (ref 6.5–8.1)

## 2019-12-22 LAB — URINALYSIS, COMPLETE (UACMP) WITH MICROSCOPIC
Bilirubin Urine: NEGATIVE
Glucose, UA: NEGATIVE mg/dL
Hgb urine dipstick: NEGATIVE
Ketones, ur: NEGATIVE mg/dL
Nitrite: NEGATIVE
Protein, ur: NEGATIVE mg/dL
Specific Gravity, Urine: 1.02 (ref 1.005–1.030)
pH: 6 (ref 5.0–8.0)

## 2019-12-22 LAB — URINE DRUG SCREEN, QUALITATIVE (ARMC ONLY)
Amphetamines, Ur Screen: NOT DETECTED
Barbiturates, Ur Screen: NOT DETECTED
Benzodiazepine, Ur Scrn: NOT DETECTED
Cannabinoid 50 Ng, Ur ~~LOC~~: NOT DETECTED
Cocaine Metabolite,Ur ~~LOC~~: NOT DETECTED
MDMA (Ecstasy)Ur Screen: NOT DETECTED
Methadone Scn, Ur: NOT DETECTED
Opiate, Ur Screen: POSITIVE — AB
Phencyclidine (PCP) Ur S: NOT DETECTED
Tricyclic, Ur Screen: POSITIVE — AB

## 2019-12-22 LAB — CBC
HCT: 35.5 % — ABNORMAL LOW (ref 36.0–46.0)
Hemoglobin: 11.6 g/dL — ABNORMAL LOW (ref 12.0–15.0)
MCH: 31.9 pg (ref 26.0–34.0)
MCHC: 32.7 g/dL (ref 30.0–36.0)
MCV: 97.5 fL (ref 80.0–100.0)
Platelets: 362 10*3/uL (ref 150–400)
RBC: 3.64 MIL/uL — ABNORMAL LOW (ref 3.87–5.11)
RDW: 15.9 % — ABNORMAL HIGH (ref 11.5–15.5)
WBC: 6.6 10*3/uL (ref 4.0–10.5)
nRBC: 0 % (ref 0.0–0.2)

## 2019-12-22 LAB — ETHANOL: Alcohol, Ethyl (B): <10 mg/dL (ref <10)

## 2019-12-22 LAB — POC URINE PREG, ED: Preg Test, Ur: NEGATIVE

## 2019-12-22 NOTE — ED Triage Notes (Signed)
Pt states that she would like detox for opioid use, last use today (oxycodone).  Pt also states that she has a kidney stone on the right side and would like this re-evaluated.

## 2019-12-22 NOTE — ED Notes (Signed)
Pt called with no response in the WR.  

## 2019-12-22 NOTE — ED Notes (Signed)
Pt called in the WR with no response, pt is not visualized at this time 

## 2020-01-16 ENCOUNTER — Emergency Department
Admission: EM | Admit: 2020-01-16 | Discharge: 2020-01-16 | Discharge status: Against Medical Advice | Payer: Medicare Other

## 2020-01-16 NOTE — ED Triage Notes (Signed)
First Nurse Note:  Pt states that she would like detox for opioid use.    AAOx3.  Skin warm and dry. NAD

## 2020-02-14 ENCOUNTER — Ambulatory Visit: Payer: Medicare Other | Admitting: Physician Assistant

## 2020-02-27 ENCOUNTER — Other Ambulatory Visit: Payer: Self-pay

## 2020-02-27 ENCOUNTER — Emergency Department (EMERGENCY_DEPARTMENT_HOSPITAL)
Admission: EM | Admit: 2020-02-27 | Discharge: 2020-02-28 | Disposition: A | Discharge status: Other Acute Inpatient Hospital | Payer: Medicare Other | Source: Home / Self Care | Attending: Emergency Medicine | Admitting: Emergency Medicine

## 2020-02-27 ENCOUNTER — Encounter: Payer: Self-pay | Admitting: Intensive Care

## 2020-02-27 DIAGNOSIS — J449 Chronic obstructive pulmonary disease, unspecified: Secondary | ICD-10-CM | POA: Insufficient documentation

## 2020-02-27 DIAGNOSIS — F1410 Cocaine abuse, uncomplicated: Secondary | ICD-10-CM | POA: Insufficient documentation

## 2020-02-27 DIAGNOSIS — R45851 Suicidal ideations: Secondary | ICD-10-CM

## 2020-02-27 DIAGNOSIS — E039 Hypothyroidism, unspecified: Secondary | ICD-10-CM | POA: Insufficient documentation

## 2020-02-27 DIAGNOSIS — Z046 Encounter for general psychiatric examination, requested by authority: Secondary | ICD-10-CM | POA: Insufficient documentation

## 2020-02-27 DIAGNOSIS — F1123 Opioid dependence with withdrawal: Secondary | ICD-10-CM | POA: Insufficient documentation

## 2020-02-27 DIAGNOSIS — F3181 Bipolar II disorder: Secondary | ICD-10-CM | POA: Diagnosis not present

## 2020-02-27 DIAGNOSIS — I10 Essential (primary) hypertension: Secondary | ICD-10-CM | POA: Insufficient documentation

## 2020-02-27 DIAGNOSIS — Z7951 Long term (current) use of inhaled steroids: Secondary | ICD-10-CM | POA: Insufficient documentation

## 2020-02-27 DIAGNOSIS — Z20822 Contact with and (suspected) exposure to covid-19: Secondary | ICD-10-CM | POA: Insufficient documentation

## 2020-02-27 DIAGNOSIS — F319 Bipolar disorder, unspecified: Secondary | ICD-10-CM | POA: Insufficient documentation

## 2020-02-27 DIAGNOSIS — F32A Depression, unspecified: Secondary | ICD-10-CM

## 2020-02-27 DIAGNOSIS — Z79899 Other long term (current) drug therapy: Secondary | ICD-10-CM | POA: Insufficient documentation

## 2020-02-27 DIAGNOSIS — K5900 Constipation, unspecified: Secondary | ICD-10-CM | POA: Diagnosis not present

## 2020-02-27 DIAGNOSIS — R112 Nausea with vomiting, unspecified: Secondary | ICD-10-CM | POA: Insufficient documentation

## 2020-02-27 DIAGNOSIS — Z87891 Personal history of nicotine dependence: Secondary | ICD-10-CM | POA: Insufficient documentation

## 2020-02-27 DIAGNOSIS — F1110 Opioid abuse, uncomplicated: Secondary | ICD-10-CM

## 2020-02-27 DIAGNOSIS — F1193 Opioid use, unspecified with withdrawal: Secondary | ICD-10-CM

## 2020-02-27 DIAGNOSIS — F603 Borderline personality disorder: Secondary | ICD-10-CM | POA: Diagnosis present

## 2020-02-27 LAB — COMPREHENSIVE METABOLIC PANEL
ALT: 50 U/L — ABNORMAL HIGH (ref 0–44)
AST: 39 U/L (ref 15–41)
Albumin: 4.3 g/dL (ref 3.5–5.0)
Alkaline Phosphatase: 75 U/L (ref 38–126)
Anion gap: 8 (ref 5–15)
BUN: 22 mg/dL — ABNORMAL HIGH (ref 6–20)
CO2: 24 mmol/L (ref 22–32)
Calcium: 8.6 mg/dL — ABNORMAL LOW (ref 8.9–10.3)
Chloride: 105 mmol/L (ref 98–111)
Creatinine, Ser: 1.39 mg/dL — ABNORMAL HIGH (ref 0.44–1.00)
GFR, Estimated: 48 mL/min — ABNORMAL LOW (ref >60)
Glucose, Bld: 98 mg/dL (ref 70–99)
Potassium: 3.7 mmol/L (ref 3.5–5.1)
Sodium: 137 mmol/L (ref 135–145)
Total Bilirubin: 0.6 mg/dL (ref 0.3–1.2)
Total Protein: 7.5 g/dL (ref 6.5–8.1)

## 2020-02-27 LAB — CBC
HCT: 34.8 % — ABNORMAL LOW (ref 36.0–46.0)
Hemoglobin: 11.6 g/dL — ABNORMAL LOW (ref 12.0–15.0)
MCH: 33.2 pg (ref 26.0–34.0)
MCHC: 33.3 g/dL (ref 30.0–36.0)
MCV: 99.7 fL (ref 80.0–100.0)
Platelets: 317 10*3/uL (ref 150–400)
RBC: 3.49 MIL/uL — ABNORMAL LOW (ref 3.87–5.11)
RDW: 15.1 % (ref 11.5–15.5)
WBC: 9 10*3/uL (ref 4.0–10.5)
nRBC: 0 % (ref 0.0–0.2)

## 2020-02-27 LAB — RESP PANEL BY RT-PCR (FLU A&B, COVID) ARPGX2
Influenza A by PCR: NEGATIVE
Influenza B by PCR: NEGATIVE
SARS Coronavirus 2 by RT PCR: NEGATIVE

## 2020-02-27 LAB — URINE DRUG SCREEN, QUALITATIVE (ARMC ONLY)
Amphetamines, Ur Screen: POSITIVE — AB
Barbiturates, Ur Screen: NOT DETECTED
Benzodiazepine, Ur Scrn: NOT DETECTED
Cannabinoid 50 Ng, Ur ~~LOC~~: NOT DETECTED
Cocaine Metabolite,Ur ~~LOC~~: NOT DETECTED
MDMA (Ecstasy)Ur Screen: NOT DETECTED
Methadone Scn, Ur: NOT DETECTED
Opiate, Ur Screen: NOT DETECTED
Phencyclidine (PCP) Ur S: NOT DETECTED
Tricyclic, Ur Screen: NOT DETECTED

## 2020-02-27 LAB — ETHANOL: Alcohol, Ethyl (B): <10 mg/dL (ref <10)

## 2020-02-27 LAB — PREGNANCY, URINE: Preg Test, Ur: NEGATIVE

## 2020-02-27 LAB — ACETAMINOPHEN LEVEL: Acetaminophen (Tylenol), Serum: <10 ug/mL — ABNORMAL LOW (ref 10–30)

## 2020-02-27 LAB — SALICYLATE LEVEL: Salicylate Lvl: <7 mg/dL — ABNORMAL LOW (ref 7.0–30.0)

## 2020-02-27 MED ORDER — ONDANSETRON 4 MG PO TBDP
ORAL_TABLET | ORAL | Status: AC
  Administered 2020-02-27: 4 mg via ORAL
  Filled 2020-02-27: qty 1

## 2020-02-27 MED ORDER — ONDANSETRON 4 MG PO TBDP: 4.0000 mg | ORAL_TABLET | Freq: Once | ORAL | Status: AC

## 2020-02-27 NOTE — ED Notes (Addendum)
While pt was dressing out tech noticed she had something in her hand. She held whatever it was in her hand the entire time she was getting dressed. She did put whatever she had down on the bench while she took her bra off and tried to stand in front of tech so that tech would not see what she had. Tech was aware she had something in her hand the whole time, however tech did not want to call the pt out while being the only one in the room. The female officer was right outside the door. When the tech and pt left the room the pt said she needed to go ahead to the bathroom. Tech told her to walk ahead and tech whispered to officer to get what is in pt hand, that pt had something in her right hand. Officer confronted pt and pt said it was nothing and when officer was able to open pt hand she had pieces of  blue pills. Pt said that they are her medication and that we cant take her medicine. Pt said that she is prescribed adderall and that the behavioral unit wont give her adderall when she is here. Officer and tech had to forcefully open pt hand and bear hug and pry hand open to get the pills. Tech informed Paulino Rily in quad what happened and the RN witnessed the  Disposal of the pills in the sharps container in the middle hydration station.

## 2020-02-27 NOTE — BH Assessment (Signed)
Comprehensive Clinical Assessment (CCA) Note  02/27/2020 Kirsten Richardson [id number]  Chief Complaint: Patient is a 45 year old female presenting to [facility] ED under IVC given by [facility]. Per triage note Patient arrived with IVC papers by [place] [employer]. Papers report "45 year old presented to [facility] voluntarily with BPD after CST team called a wellness check. Kruti reports current suicidal thoughts that have worsened in last two weeks. Plan is to "slit my throat and was looking at knife in kitchen this morning." Sarabelle reports no hope things will improve. Arvis reports previous suicide attempts and hospitalizations. Reports medical issues that are causing her pain. Reports she is currently prescribed medications and believes they are not working." Denies SI or HI at this moment. Reports the last several weeks have been going downhill and more depressed. Patient cooperative and calm in triage. 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 Patient reports why she presenting to the ED "because I have SI with a plan to slit my throat." Patient denies ever attempting by this method but per [facility] patient has previous attempts in the past, patient has overdosed several times in 2018, slit her wrist at age 35, and drank charcoal in 2008. When asked if patient is experiencing AH she reports "I hear voices." When asked what the voices say to her patient is unable to report "I don't know." Per [facility] assessment patient reports seeing and hearing bugs on the walls. Patient UDS is positive for Amphetamines, but patient denies using any substances today. Patient also has a history of Opioid Use. Patient continues to report SI/AH/VH denies HI.  Per Psyc NP Elenore Paddy patient is recommended for Inpatient Hospitalization   Chief Complaint  Patient presents with  . Suicidal   Visit Diagnosis: Polysubstance Use, Bipolar Disorder by hx   CCA Screening, Triage and Referral (STR)  Patient Reported Information How did you hear about [place]? Other (Comment)  Referral name: [facility]  Referral phone number: No data recorded  Whom do you see for routine medical problems? Other (Comment)  Practice/Facility Name: No data recorded Practice/Facility Phone Number: No data recorded Name of Contact: No data recorded Contact Number: No data recorded Contact Fax Number: No data recorded Prescriber Name: No data recorded Prescriber Address (if known): No data recorded  What Is the Reason for Your Visit/Call Today? Patient presents with SI with plan and Polysubstance abuse  How Long Has This Been Causing You Problems? > than 6 months  What Do You Feel Would Help You the Most Today? Assessment Only; Therapy; Medication   Have You Recently Been in Any Inpatient Treatment (Hospital/Detox/Crisis Center/28-Day Program)? No  Name/Location of Program/Hospital:No data recorded How Long Were You There? No data recorded When Were You Discharged? No data recorded  Have You Ever Received Services From [facility] Before? No  Who Do You See at [facility]? No data recorded  Have You Recently Had Any Thoughts About Hurting Yourself? Yes  Are You Planning to Commit Suicide/Harm Yourself At This time? Yes   Have you Recently Had Thoughts About Hurting Someone [name]? No  Explanation: No data recorded  Have You Used Any Alcohol or Drugs in the Past 24 Hours? Yes  How Long Ago Did You Use Drugs or Alcohol? 2240  What Did You Use and  How Much? Ampthetamines   Do You Currently Have a Therapist/Psychiatrist? No  Name of Therapist/Psychiatrist: No data recorded  Have You Been Recently Discharged From Any Office Practice or Programs? No  Explanation of Discharge From Practice/Program: No data recorded    CCA Screening  Triage Referral Assessment Type of Contact: Face-to-Face  Is this Initial or Reassessment? No data recorded Date Telepsych consult ordered in [facility]:  No data recorded Time Telepsych consult ordered in [facility]:  No data recorded  Patient Reported Information Reviewed? Yes  Patient Left Without Being Seen? No data recorded Reason for Not Completing Assessment: No data recorded  Collateral Involvement: No data recorded  Does Patient Have a Court Appointed Legal Guardian? No data recorded Name and Contact of Legal Guardian: Self  If Minor and Not Living with Parent(s), Who has Custody? No data recorded Is CPS involved or ever been involved? Never  Is APS involved or ever been involved? Never   Patient Determined To Be At Risk for Harm To Self or Others Based on Review of Patient Reported Information or Presenting Complaint? Yes, for Self-Harm  Method: No data recorded Availability of Means: No data recorded Intent: No data recorded Notification Required: No data recorded Additional Information for Danger to Others Potential: No data recorded Additional Comments for Danger to Others Potential: No data recorded Are There Guns or Other Weapons in Your Home? No data recorded Types of Guns/Weapons: No data recorded Are These Weapons Safely Secured?                            No data recorded Who Could Verify You Are Able To Have These Secured: No data recorded Do You Have any Outstanding Charges, Pending Court Dates, Parole/Probation? No data recorded Contacted To Inform of Risk of Harm To Self or Others: No data recorded  Location of Assessment: [facility] ED   Does Patient Present under Involuntary Commitment? Yes  IVC Papers Initial File Date: 02/27/2020   [place] of Residence: [place]   Patient Currently Receiving the Following Services: No data recorded  Determination of Need: Emergent (2 hours)   Options For Referral: No data recorded    CCA Biopsychosocial Intake/Chief  Complaint:  Patient is presenting under IVC from [facility] due to SI with a plan  Current Symptoms/Problems: Patient is presenting under IVC from [facility] due to SI with a plan   Patient Reported Schizophrenia/Schizoaffective Diagnosis in Past: No   Strengths: UTA  Preferences: UTA  Abilities: UTA   Type of Services Patient Feels are Needed: UTA   Initial Clinical Notes/Concerns: None   Mental Health Symptoms Depression:  Change in energy/activity; Fatigue; Hopelessness; Sleep (too much or little); Worthlessness   Duration of Depressive symptoms: Greater than two weeks   Mania:  None   Anxiety:   None   Psychosis:  Hallucinations   Duration of Psychotic symptoms: Less than six months   Trauma:  None   Obsessions:  None   Compulsions:  None   Inattention:  None   Hyperactivity/Impulsivity:  N/A   Oppositional/Defiant Behaviors:  None   Emotional Irregularity:  None   Other Mood/Personality Symptoms:  No data recorded   Mental Status Exam Appearance and self-care  Stature:  Average   Weight:  Average weight   Clothing:  Disheveled   Grooming:  Neglected   Cosmetic use:  None   Posture/gait:  Normal   Motor activity:  Not Remarkable   Sensorium  Attention:  Distractible   Concentration:  Scattered   Orientation:  X5   Recall/memory:  Normal   Affect and Mood  Affect:  Flat; Depressed   Mood:  Depressed   Relating  Eye contact:  Avoided   Facial expression:  Responsive   Attitude toward examiner:  Cooperative   Thought and Language  Speech flow: Slurred; Slow   Thought content:  Appropriate to Mood and Circumstances   Preoccupation:  None   Hallucinations:  Auditory; Visual   Organization:  No data recorded  [employer] of Knowledge:  Fair   Intelligence:  Average   Abstraction:  Functional   Judgement:  Poor   Reality Testing:  Adequate   Insight:  Lacking; Poor   Decision Making:  Impulsive   Social  Functioning  Social Maturity:  Isolates   Social Judgement:  Heedless   Stress  Stressors:  Other (Comment)   Coping Ability:  Exhausted   Skill Deficits:  None   Supports:  Support needed     Religion: Religion/Spirituality Are You A Religious Person?: No  Leisure/Recreation: Leisure / Recreation Do You Have Hobbies?: No  Exercise/Diet: Exercise/Diet Do You Exercise?: No Have You Gained or Lost A Significant Amount of Weight in the Past Six Months?: No Do You Follow a Special Diet?: No Do You Have Any Trouble Sleeping?: No   CCA Employment/Education Employment/Work Situation: Employment / Work Situation Employment situation: On disability Why is patient on disability: Unknown How long has patient been on disability: Unknown Patient's job has been impacted by current illness:  (patient is currently on disability) What is the longest time patient has a held a job?: Unknown Has patient ever been in the [employer]?: No  Education: Education Is Patient Currently Attending School?: No Did Garment/textile technologist From [employer]?:  (Unknown) Did You Have An Individualized Education Program (IIEP): No Did You Have Any Difficulty At [employer]?: No Patient's Education Has Been Impacted by Current Illness: No   CCA Family/Childhood History Family and Relationship History: Family history Marital status: Single Are you sexually active?:  (Unknown) What is your sexual orientation?: Heterosexual Has your sexual activity been affected by drugs, alcohol, medication, or emotional stress?: Unknown Does patient have children?: No  Childhood History:  Childhood History By whom was/is the patient raised?:  (Unknown) Additional childhood history information: UTA Description of patient's relationship with caregiver when they were a child: UTA Patient's description of current relationship with people who raised him/her: UTA How were you disciplined when you got in trouble as a  child/adolescent?: UTA Does patient have siblings?: No Did patient suffer any verbal/emotional/physical/sexual abuse as a child?:  (UTA) Did patient suffer from severe childhood neglect?:  (UTA) Has patient ever been sexually abused/assaulted/raped as an adolescent or adult?:  (UTA) Was the patient ever a victim of a crime or a disaster?:  (UTA) Witnessed domestic violence?:  (UTA) Has patient been affected by domestic violence as an adult?:  Industrial/product designer)  Child/Adolescent Assessment:     CCA Substance Use Alcohol/Drug Use: Alcohol / Drug Use Pain Medications: See MAR Prescriptions: See MAR Over the Counter: See MAR History of alcohol / drug use?: Yes Substance #1 Name of Substance 1: Amphetamines Substance #2 Name of Substance 2: Oxycodone                     ASAM's:  Six Dimensions of Multidimensional Assessment  Dimension 1:  Acute Intoxication and/or Withdrawal Potential:      Dimension 2:  Biomedical Conditions and Complications:      Dimension 3:  Emotional, Behavioral, or Cognitive Conditions and Complications:     Dimension 4:  Readiness to Change:     Dimension 5:  Relapse, Continued use, or Continued Problem Potential:     Dimension 6:  Recovery/Living Environment:     ASAM Severity Score:    ASAM Recommended Level of Treatment:     Substance use Disorder (SUD)    Recommendations for Services/Supports/Treatments:   Per Psyc NP Elenore Paddy patient is recommended for Inpatient Hospitalization   DSM5 Diagnoses: Patient Active Problem List   Diagnosis Date Noted  . Homicidal thoughts   . Depression with suicidal ideation 05/27/2018  . Cocaine abuse (HCC) 05/27/2018  . Suicidal thoughts 05/26/2018  . Peptic ulcer 08/19/2017  . COPD (chronic obstructive pulmonary disease) (HCC) 08/26/2016  . Constipation 08/26/2016  . Bipolar 2 disorder, major depressive episode (HCC) 08/25/2016  . Essential hypertension 08/31/2015  . DDD (degenerative disc disease),  lumbar 08/02/2015  . DDD (degenerative disc disease), cervical 08/02/2015  . Hypothyroidism due to acquired atrophy of thyroid 04/04/2015  . Hip pain, chronic 09/27/2014  . Borderline personality disorder (HCC) 09/27/2014  . SUI (stress urinary incontinence, female) 01/02/2014    Patient Centered Plan: Patient is on the following Treatment Plan(s):  Depression and Substance Abuse   Referrals to Alternative Service(s): Referred to Alternative Service(s):   Place:   Date:   Time:    Referred to Alternative Service(s):   Place:   Date:   Time:    Referred to Alternative Service(s):   Place:   Date:   Time:    Referred to Alternative Service(s):   Place:   Date:   Time:     Jarita Raval A Clinton Wahlberg, LCAS-A

## 2020-02-27 NOTE — ED Notes (Signed)
Grey tennis shoes Carney Bern shorts Brown belt White tshirt  White bra  [employer]  White socks Pink short sleeve jacket  Black contigo water bottle  Back pocket book with keys attached to side   Belongings given to quad [employer]

## 2020-02-27 NOTE — ED Triage Notes (Addendum)
Patient arrived with IVC papers by [place] [employer]. Papers report "45 year old presented to [facility] voluntarily with BPD after CST team called a wellness check. Kirsten Richardson reports current suicidal thoughts that have worsened in last two weeks. Plan is to "slit my throat and was looking at knife in kitchen this morning." Legacy reports no hope things will improve. Kirsten Richardson reports previous suicide attempts and hospitalizations. Reports medical issues that are causing her pain. Reports she is currently prescribed medications and believes they are not working." Denies SI or HI at this moment. Reports the last several weeks have been going downhill and more depressed. Patient cooperative and calm in triage. 

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
## 2020-02-27 NOTE — ED Triage Notes (Addendum)
First Nurse Note:  Arrives with BPD under IVC for mental health evaluation.  Patient is AAOx3.  Skin warm and dry.  Calm and cooperative at this time.

## 2020-02-28 ENCOUNTER — Encounter: Payer: Self-pay | Admitting: Psychiatry

## 2020-02-28 ENCOUNTER — Other Ambulatory Visit: Payer: Self-pay

## 2020-02-28 ENCOUNTER — Inpatient Hospital Stay
Admission: AD | Admit: 2020-02-28 | Discharge: 2020-03-07 | DRG: 885 | Disposition: A | Discharge status: Home / Self Care | Payer: Medicare Other | Source: Intra-hospital | Attending: Behavioral Health | Admitting: Behavioral Health

## 2020-02-28 DIAGNOSIS — E6609 Other obesity due to excess calories: Secondary | ICD-10-CM | POA: Diagnosis present

## 2020-02-28 DIAGNOSIS — F3181 Bipolar II disorder: Principal | ICD-10-CM | POA: Diagnosis present

## 2020-02-28 DIAGNOSIS — M25559 Pain in unspecified hip: Secondary | ICD-10-CM | POA: Diagnosis present

## 2020-02-28 DIAGNOSIS — R45851 Suicidal ideations: Secondary | ICD-10-CM | POA: Diagnosis present

## 2020-02-28 DIAGNOSIS — E039 Hypothyroidism, unspecified: Secondary | ICD-10-CM | POA: Diagnosis present

## 2020-02-28 DIAGNOSIS — D529 Folate deficiency anemia, unspecified: Secondary | ICD-10-CM | POA: Diagnosis present

## 2020-02-28 DIAGNOSIS — F603 Borderline personality disorder: Secondary | ICD-10-CM | POA: Diagnosis present

## 2020-02-28 DIAGNOSIS — N393 Stress incontinence (female) (male): Secondary | ICD-10-CM | POA: Diagnosis present

## 2020-02-28 DIAGNOSIS — K219 Gastro-esophageal reflux disease without esophagitis: Secondary | ICD-10-CM | POA: Diagnosis present

## 2020-02-28 DIAGNOSIS — Z6838 Body mass index (BMI) 38.0-38.9, adult: Secondary | ICD-10-CM | POA: Diagnosis not present

## 2020-02-28 DIAGNOSIS — Z9151 Personal history of suicidal behavior: Secondary | ICD-10-CM | POA: Diagnosis not present

## 2020-02-28 DIAGNOSIS — E034 Atrophy of thyroid (acquired): Secondary | ICD-10-CM | POA: Diagnosis present

## 2020-02-28 DIAGNOSIS — Z87891 Personal history of nicotine dependence: Secondary | ICD-10-CM

## 2020-02-28 DIAGNOSIS — G629 Polyneuropathy, unspecified: Secondary | ICD-10-CM | POA: Diagnosis present

## 2020-02-28 DIAGNOSIS — Z818 Family history of other mental and behavioral disorders: Secondary | ICD-10-CM | POA: Diagnosis not present

## 2020-02-28 DIAGNOSIS — Z79890 Hormone replacement therapy (postmenopausal): Secondary | ICD-10-CM

## 2020-02-28 DIAGNOSIS — Z888 Allergy status to other drugs, medicaments and biological substances status: Secondary | ICD-10-CM

## 2020-02-28 DIAGNOSIS — G8929 Other chronic pain: Secondary | ICD-10-CM | POA: Diagnosis present

## 2020-02-28 DIAGNOSIS — J439 Emphysema, unspecified: Secondary | ICD-10-CM | POA: Diagnosis present

## 2020-02-28 DIAGNOSIS — M797 Fibromyalgia: Secondary | ICD-10-CM | POA: Diagnosis present

## 2020-02-28 DIAGNOSIS — K5900 Constipation, unspecified: Secondary | ICD-10-CM | POA: Diagnosis present

## 2020-02-28 DIAGNOSIS — J449 Chronic obstructive pulmonary disease, unspecified: Secondary | ICD-10-CM | POA: Diagnosis not present

## 2020-02-28 DIAGNOSIS — Z7951 Long term (current) use of inhaled steroids: Secondary | ICD-10-CM | POA: Diagnosis not present

## 2020-02-28 DIAGNOSIS — I10 Essential (primary) hypertension: Secondary | ICD-10-CM | POA: Diagnosis present

## 2020-02-28 DIAGNOSIS — Z20822 Contact with and (suspected) exposure to covid-19: Secondary | ICD-10-CM | POA: Diagnosis present

## 2020-02-28 DIAGNOSIS — F909 Attention-deficit hyperactivity disorder, unspecified type: Secondary | ICD-10-CM | POA: Diagnosis present

## 2020-02-28 DIAGNOSIS — F1110 Opioid abuse, uncomplicated: Secondary | ICD-10-CM

## 2020-02-28 DIAGNOSIS — G4700 Insomnia, unspecified: Secondary | ICD-10-CM | POA: Diagnosis present

## 2020-02-28 DIAGNOSIS — K5909 Other constipation: Secondary | ICD-10-CM | POA: Diagnosis present

## 2020-02-28 DIAGNOSIS — Z79899 Other long term (current) drug therapy: Secondary | ICD-10-CM

## 2020-02-28 DIAGNOSIS — F419 Anxiety disorder, unspecified: Secondary | ICD-10-CM | POA: Diagnosis present

## 2020-02-28 DIAGNOSIS — M5030 Other cervical disc degeneration, unspecified cervical region: Secondary | ICD-10-CM | POA: Diagnosis present

## 2020-02-28 DIAGNOSIS — R519 Headache, unspecified: Secondary | ICD-10-CM | POA: Clinically undetermined

## 2020-02-28 DIAGNOSIS — M1990 Unspecified osteoarthritis, unspecified site: Secondary | ICD-10-CM | POA: Diagnosis present

## 2020-02-28 DIAGNOSIS — M5136 Other intervertebral disc degeneration, lumbar region: Secondary | ICD-10-CM | POA: Diagnosis present

## 2020-02-28 LAB — LIPID PANEL
Cholesterol: 191 mg/dL (ref 0–200)
Cholesterol: 178 mg/dL (ref 0–200)
HDL: 36 mg/dL — ABNORMAL LOW (ref >40)
HDL: 32 mg/dL — ABNORMAL LOW (ref >40)
LDL Cholesterol: 108 mg/dL — ABNORMAL HIGH (ref 0–99)
LDL Cholesterol: 93 mg/dL (ref 0–99)
Total CHOL/HDL Ratio: 5.3 RATIO
Total CHOL/HDL Ratio: 5.6 RATIO
Triglycerides: 235 mg/dL — ABNORMAL HIGH (ref <150)
Triglycerides: 263 mg/dL — ABNORMAL HIGH (ref <150)
VLDL: 47 mg/dL — ABNORMAL HIGH (ref 0–40)
VLDL: 53 mg/dL — ABNORMAL HIGH (ref 0–40)

## 2020-02-28 LAB — HEMOGLOBIN A1C
Hgb A1c MFr Bld: 5 % (ref 4.8–5.6)
Mean Plasma Glucose: 96.8 mg/dL

## 2020-02-28 MED ORDER — PANTOPRAZOLE SODIUM 40 MG PO TBEC
40.0000 mg | DELAYED_RELEASE_TABLET | Freq: Every day | ORAL | Status: DC
  Administered 2020-02-28: 40 mg via ORAL
  Filled 2020-02-28: qty 1

## 2020-02-28 MED ORDER — RISPERIDONE 1 MG PO TABS
1.0000 mg | ORAL_TABLET | Freq: Every day | ORAL | Status: DC
Start: 2020-02-28 — End: 2020-02-28

## 2020-02-28 MED ORDER — ALUM & MAG HYDROXIDE-SIMETH 200-200-20 MG/5ML PO SUSP: 30.0000 mL | ORAL | Status: DC | PRN

## 2020-02-28 MED ORDER — LEVOTHYROXINE SODIUM 75 MCG PO TABS: 75.0000 ug | ORAL_TABLET | Freq: Every day | ORAL | Status: DC

## 2020-02-28 MED ORDER — LUBIPROSTONE 24 MCG PO CAPS
24.0000 ug | ORAL_CAPSULE | Freq: Every day | ORAL | Status: DC
  Filled 2020-02-28: qty 1

## 2020-02-28 MED ORDER — LEVOTHYROXINE SODIUM 50 MCG PO TABS
75.0000 ug | ORAL_TABLET | Freq: Every day | ORAL | Status: DC
  Administered 2020-02-29 – 2020-03-07 (×8): 75 ug via ORAL
  Filled 2020-02-28 (×8): qty 2

## 2020-02-28 MED ORDER — MOMETASONE FURO-FORMOTEROL FUM 200-5 MCG/ACT IN AERO
2.0000 | INHALATION_SPRAY | Freq: Two times a day (BID) | RESPIRATORY_TRACT | Status: DC
  Administered 2020-02-29 – 2020-03-07 (×11): 2 via RESPIRATORY_TRACT
  Filled 2020-02-28: qty 8.8

## 2020-02-28 MED ORDER — ALBUTEROL SULFATE HFA 108 (90 BASE) MCG/ACT IN AERS
1.0000 | INHALATION_SPRAY | RESPIRATORY_TRACT | Status: DC | PRN
  Filled 2020-02-28 (×2): qty 6.7

## 2020-02-28 MED ORDER — ACETAMINOPHEN 325 MG PO TABS: 650.0000 mg | ORAL_TABLET | Freq: Four times a day (QID) | ORAL | Status: DC | PRN

## 2020-02-28 MED ORDER — UMECLIDINIUM BROMIDE 62.5 MCG/INH IN AEPB
1.0000 | INHALATION_SPRAY | Freq: Every day | RESPIRATORY_TRACT | Status: DC
  Filled 2020-02-28: qty 7

## 2020-02-28 MED ORDER — LOSARTAN POTASSIUM 50 MG PO TABS
100.0000 mg | ORAL_TABLET | Freq: Every day | ORAL | Status: DC
  Administered 2020-02-29 – 2020-03-07 (×8): 100 mg via ORAL
  Filled 2020-02-28 (×8): qty 2

## 2020-02-28 MED ORDER — FUROSEMIDE 40 MG PO TABS
40.0000 mg | ORAL_TABLET | Freq: Every day | ORAL | Status: DC
  Administered 2020-02-29 – 2020-03-07 (×8): 40 mg via ORAL
  Filled 2020-02-28 (×8): qty 1

## 2020-02-28 MED ORDER — GABAPENTIN 600 MG PO TABS
300.0000 mg | ORAL_TABLET | Freq: Three times a day (TID) | ORAL | Status: DC
  Administered 2020-02-28 (×2): 300 mg via ORAL
  Filled 2020-02-28 (×2): qty 1

## 2020-02-28 MED ORDER — FAMOTIDINE 20 MG PO TABS
20.0000 mg | ORAL_TABLET | Freq: Two times a day (BID) | ORAL | Status: DC
  Administered 2020-02-29 – 2020-03-07 (×15): 20 mg via ORAL
  Filled 2020-02-28 (×15): qty 1

## 2020-02-28 MED ORDER — MOMETASONE FURO-FORMOTEROL FUM 200-5 MCG/ACT IN AERO
2.0000 | INHALATION_SPRAY | Freq: Two times a day (BID) | RESPIRATORY_TRACT | Status: DC
  Administered 2020-02-28: 2 via RESPIRATORY_TRACT
  Filled 2020-02-28: qty 8.8

## 2020-02-28 MED ORDER — ACETAMINOPHEN 325 MG PO TABS
650.0000 mg | ORAL_TABLET | Freq: Four times a day (QID) | ORAL | Status: DC | PRN
  Administered 2020-02-28: 650 mg via ORAL
  Filled 2020-02-28: qty 2

## 2020-02-28 MED ORDER — UMECLIDINIUM BROMIDE 62.5 MCG/INH IN AEPB
1.0000 | INHALATION_SPRAY | Freq: Every day | RESPIRATORY_TRACT | Status: DC
  Administered 2020-02-29 – 2020-03-07 (×8): 1 via RESPIRATORY_TRACT
  Filled 2020-02-28 (×2): qty 7

## 2020-02-28 MED ORDER — DICLOFENAC SODIUM 1 % EX GEL
4.0000 g | Freq: Four times a day (QID) | CUTANEOUS | Status: DC | PRN
  Filled 2020-02-28: qty 100

## 2020-02-28 MED ORDER — OXCARBAZEPINE 300 MG PO TABS
300.0000 mg | ORAL_TABLET | Freq: Two times a day (BID) | ORAL | Status: DC
  Administered 2020-02-29: 300 mg via ORAL
  Filled 2020-02-28: qty 1

## 2020-02-28 MED ORDER — FUROSEMIDE 40 MG PO TABS
40.0000 mg | ORAL_TABLET | Freq: Every day | ORAL | Status: DC
  Administered 2020-02-28: 40 mg via ORAL
  Filled 2020-02-28 (×2): qty 1

## 2020-02-28 MED ORDER — ALBUTEROL SULFATE HFA 108 (90 BASE) MCG/ACT IN AERS
1.0000 | INHALATION_SPRAY | RESPIRATORY_TRACT | Status: DC | PRN
  Filled 2020-02-28: qty 6.7

## 2020-02-28 MED ORDER — MIRABEGRON ER 25 MG PO TB24
25.0000 mg | ORAL_TABLET | Freq: Every day | ORAL | Status: DC
  Administered 2020-02-29 – 2020-03-06 (×7): 25 mg via ORAL
  Filled 2020-02-28 (×9): qty 1

## 2020-02-28 MED ORDER — ONDANSETRON 4 MG PO TBDP
4.0000 mg | ORAL_TABLET | Freq: Three times a day (TID) | ORAL | Status: DC | PRN
  Administered 2020-02-28: 4 mg via ORAL
  Filled 2020-02-28 (×2): qty 1

## 2020-02-28 MED ORDER — METHOCARBAMOL 500 MG PO TABS
750.0000 mg | ORAL_TABLET | Freq: Four times a day (QID) | ORAL | Status: DC
  Administered 2020-02-28 – 2020-03-07 (×29): 750 mg via ORAL
  Filled 2020-02-28 (×27): qty 2

## 2020-02-28 MED ORDER — FLUOXETINE HCL 20 MG PO CAPS
40.0000 mg | ORAL_CAPSULE | Freq: Every day | ORAL | Status: DC
  Administered 2020-02-29: 40 mg via ORAL
  Filled 2020-02-28: qty 2

## 2020-02-28 MED ORDER — FLUOXETINE HCL 20 MG PO CAPS
40.0000 mg | ORAL_CAPSULE | Freq: Every day | ORAL | Status: DC
Start: 2020-02-28 — End: 2020-02-28
  Administered 2020-02-28: 40 mg via ORAL
  Filled 2020-02-28: qty 2

## 2020-02-28 MED ORDER — BUPRENORPHINE HCL-NALOXONE HCL 8-2 MG SL SUBL
1.0000 | SUBLINGUAL_TABLET | Freq: Every day | SUBLINGUAL | Status: DC
Start: 2020-02-28 — End: 2020-02-28
  Administered 2020-02-28: 1 via SUBLINGUAL
  Filled 2020-02-28: qty 1

## 2020-02-28 MED ORDER — BUPROPION HCL ER (XL) 300 MG PO TB24
300.0000 mg | ORAL_TABLET | Freq: Every day | ORAL | Status: DC
  Administered 2020-02-28: 300 mg via ORAL
  Filled 2020-02-28: qty 1

## 2020-02-28 MED ORDER — CYCLOBENZAPRINE HCL 10 MG PO TABS: 10.0000 mg | ORAL_TABLET | Freq: Every day | ORAL | Status: DC

## 2020-02-28 MED ORDER — FOLIC ACID 1 MG PO TABS
1.0000 mg | ORAL_TABLET | Freq: Every day | ORAL | Status: DC
  Administered 2020-02-28: 1 mg via ORAL
  Filled 2020-02-28: qty 1

## 2020-02-28 MED ORDER — LOSARTAN POTASSIUM 50 MG PO TABS
100.0000 mg | ORAL_TABLET | Freq: Every day | ORAL | Status: DC
  Administered 2020-02-28: 100 mg via ORAL
  Filled 2020-02-28 (×2): qty 2

## 2020-02-28 MED ORDER — ONDANSETRON 4 MG PO TBDP
4.0000 mg | ORAL_TABLET | Freq: Three times a day (TID) | ORAL | Status: DC | PRN
  Administered 2020-03-06: 4 mg via ORAL
  Filled 2020-02-28 (×2): qty 1

## 2020-02-28 MED ORDER — BUPRENORPHINE HCL-NALOXONE HCL 2-0.5 MG SL SUBL: 2.0000 | SUBLINGUAL_TABLET | Freq: Two times a day (BID) | SUBLINGUAL | Status: DC

## 2020-02-28 MED ORDER — BUPROPION HCL ER (XL) 150 MG PO TB24
300.0000 mg | ORAL_TABLET | Freq: Every day | ORAL | Status: DC
  Administered 2020-02-29: 300 mg via ORAL
  Filled 2020-02-28: qty 2

## 2020-02-28 MED ORDER — MIRABEGRON ER 25 MG PO TB24
25.0000 mg | ORAL_TABLET | Freq: Every day | ORAL | Status: DC
Start: 2020-02-28 — End: 2020-02-28
  Filled 2020-02-28: qty 1

## 2020-02-28 MED ORDER — FOLIC ACID 1 MG PO TABS
1.0000 mg | ORAL_TABLET | Freq: Every day | ORAL | Status: DC
  Administered 2020-02-29 – 2020-03-07 (×8): 1 mg via ORAL
  Filled 2020-02-28 (×8): qty 1

## 2020-02-28 MED ORDER — LUBIPROSTONE 24 MCG PO CAPS
24.0000 ug | ORAL_CAPSULE | Freq: Every day | ORAL | Status: DC
  Administered 2020-02-29 – 2020-03-07 (×8): 24 ug via ORAL
  Filled 2020-02-28 (×8): qty 1

## 2020-02-28 MED ORDER — TOPIRAMATE 25 MG PO TABS
150.0000 mg | ORAL_TABLET | Freq: Every day | ORAL | Status: DC
  Administered 2020-02-28: 150 mg via ORAL
  Filled 2020-02-28: qty 2

## 2020-02-28 MED ORDER — NICOTINE 21 MG/24HR TD PT24
21.0000 mg | MEDICATED_PATCH | Freq: Once | TRANSDERMAL | Status: DC
  Administered 2020-02-28: 21 mg via TRANSDERMAL

## 2020-02-28 MED ORDER — FAMOTIDINE 20 MG PO TABS
20.0000 mg | ORAL_TABLET | Freq: Two times a day (BID) | ORAL | Status: DC
  Administered 2020-02-28: 20 mg via ORAL
  Filled 2020-02-28: qty 1

## 2020-02-28 MED ORDER — MAGNESIUM HYDROXIDE 400 MG/5ML PO SUSP
30.0000 mL | Freq: Every day | ORAL | Status: DC | PRN
  Administered 2020-03-02 – 2020-03-05 (×2): 30 mL via ORAL
  Filled 2020-02-28 (×3): qty 30

## 2020-02-28 MED ORDER — ONDANSETRON 4 MG PO TBDP
4.0000 mg | ORAL_TABLET | Freq: Once | ORAL | Status: DC
  Filled 2020-02-28 (×2): qty 1

## 2020-02-28 MED ORDER — CYCLOBENZAPRINE HCL 10 MG PO TABS
10.0000 mg | ORAL_TABLET | Freq: Every day | ORAL | Status: DC
  Administered 2020-02-28 – 2020-03-06 (×8): 10 mg via ORAL
  Filled 2020-02-28 (×8): qty 1

## 2020-02-28 MED ORDER — METHOCARBAMOL 750 MG PO TABS
750.0000 mg | ORAL_TABLET | Freq: Four times a day (QID) | ORAL | Status: DC
  Administered 2020-02-28 (×2): 750 mg via ORAL
  Filled 2020-02-28 (×4): qty 1

## 2020-02-28 MED ORDER — GABAPENTIN 600 MG PO TABS
300.0000 mg | ORAL_TABLET | Freq: Three times a day (TID) | ORAL | Status: DC
  Administered 2020-02-28 – 2020-03-02 (×13): 300 mg via ORAL
  Filled 2020-02-28 (×13): qty 1

## 2020-02-28 MED ORDER — PANTOPRAZOLE SODIUM 40 MG PO TBEC
40.0000 mg | DELAYED_RELEASE_TABLET | Freq: Every day | ORAL | Status: DC
  Administered 2020-02-29 – 2020-03-07 (×8): 40 mg via ORAL
  Filled 2020-02-28 (×8): qty 1

## 2020-02-28 MED ORDER — TOPIRAMATE 25 MG PO TABS
150.0000 mg | ORAL_TABLET | Freq: Every day | ORAL | Status: DC
  Administered 2020-02-29 – 2020-03-07 (×8): 150 mg via ORAL
  Filled 2020-02-28 (×8): qty 6

## 2020-02-28 MED ORDER — HYDROXYZINE HCL 25 MG PO TABS: 25.0000 mg | ORAL_TABLET | Freq: Three times a day (TID) | ORAL | Status: DC | PRN

## 2020-02-28 MED ORDER — OXCARBAZEPINE 300 MG PO TABS
300.0000 mg | ORAL_TABLET | Freq: Two times a day (BID) | ORAL | Status: DC
  Administered 2020-02-28: 300 mg via ORAL
  Filled 2020-02-28 (×3): qty 1

## 2020-02-28 MED ORDER — RISPERIDONE 1 MG PO TABS
1.0000 mg | ORAL_TABLET | Freq: Every day | ORAL | Status: DC
  Administered 2020-02-28 – 2020-03-02 (×4): 1 mg via ORAL
  Filled 2020-02-28 (×4): qty 1

## 2020-02-28 NOTE — ED Notes (Signed)
Pharmacy on phone with patient

## 2020-02-28 NOTE — ED Notes (Signed)
IVC/Consult Completed/Pending Placement 

## 2020-02-28 NOTE — ED Notes (Signed)
Pharmacy informed of needing meds reconciled.
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
## 2020-02-28 NOTE — ED Notes (Signed)
Pt given shower supplies and denture adhesive.

## 2020-02-28 NOTE — ED Provider Notes (Signed)
Emergency Medicine Observation Re-evaluation Note  Kirsten Richardson is a 45 y.o. female, seen on rounds today.  Pt initially presented to the ED for complaints of Suicidal  Currently, the patient is is no acute distress. Denies any concerns at this time.  Patient requesting something for her oxycodone withdrawal.  Also needs for home meds reordered  Physical Exam  Blood pressure (!) 136/93, pulse 83, temperature (!) 97.5 F (36.4 C), temperature source Oral, resp. rate 16, height 4\' 11"  (1.499 m), weight 80.7 kg, SpO2 100 %.  Physical Exam General: No apparent distress HEENT: moist mucous membranes CV: RRR Pulm: Normal WOB GI: soft and non tender MSK: no edema or cyanosis Neuro: face symmetric, moving all extremities     ED Course / MDM     I have reviewed the labs performed to date as well as medications administered while in observation.  Recent changes in the last 24 hours include will reorder home meds  Plan   Current plan is to continue to wait for psych plan/placement if felt warranted  Patient is under full IVC at this time. We will discuss with psych to see if she would meet requirements to get Suboxone and restart home meds   , MD 02/28/20 1022

## 2020-02-28 NOTE — BH Assessment (Signed)
Patient under review with [facility] Joann

## 2020-02-28 NOTE — BH Assessment (Addendum)
Patient to be reviewed with [facility] later this morning 02/28/20, if no bed available patient to be referred to additional facilities

## 2020-02-28 NOTE — ED Notes (Signed)
Report received from [place], RN including  Situation, Background, Assessment, and Recommendations. Patient alert and oriented, warm and dry, in no acute distress. Patient denies HI and pain. Patient made aware of Q15 minute rounds and security cameras for their safety. Patient instructed to come to this nurse with needs or concerns.

## 2020-02-28 NOTE — ED Notes (Signed)
Pt. To BHU from ED ambulatory without difficulty, to room  BHU 3. Report from [facility]. Pt. Is alert and oriented, warm and dry in no distress. Pt. Denies SI, HI, and AVH. Pt. Calm and cooperative. Pt. Made aware of security cameras and Q15 minute rounds. Pt. Encouraged to let Nursing staff know of any concerns or needs.   ENVIRONMENTAL ASSESSMENT Potentially harmful objects out of patient reach: Yes.   Personal belongings secured: Yes.   Patient dressed in hospital provided attire only: Yes.   Plastic bags out of patient reach: Yes.   Patient care equipment (cords, cables, call bells, lines, and drains) shortened, removed, or accounted for: Yes.   Equipment and supplies removed from bottom of stretcher: Yes.   Potentially toxic materials out of patient reach: Yes.   Sharps container removed or out of patient reach: Yes.

## 2020-02-28 NOTE — BH Assessment (Signed)
Patient can come down after 8pm  Call to give report: [phone]  Patient [facility] to be [facility] to [facility] byDr. Neale Burly.  Attending Physician will be.Dr. Neale Burly.  Patient has been assigned to room319, by [facility] Charge Nurse Delorse Limber , RN.  Intake Paper Work has been signed and placed on patient chart.  ER staff is aware of the admission: 1. Nitchia, ER Secretary  2. Fuller Plan, ER MD  3. Maralyn Sago, Patient's Nurse  4. THO, Patient Access.

## 2020-02-28 NOTE — ED Notes (Signed)
Pt given breakfast tray

## 2020-02-28 NOTE — Plan of Care (Signed)
Patient new to the unit tonight, hasn't had time to progress  Problem: Education: Goal: Knowledge of [place] [facility] Education information/materials will improve Outcome: Not Progressing Goal: Emotional status will improve Outcome: Not Progressing Goal: Mental status will improve Outcome: Not Progressing Goal: Verbalization of understanding the information provided will improve Outcome: Not Progressing   Problem: Safety: Goal: Periods of time without injury will increase Outcome: Not Progressing   Problem: Education: Goal: Ability to make informed decisions regarding treatment will improve Outcome: Not Progressing   Problem: Medication: Goal: Compliance with prescribed medication regimen will improve Outcome: Not Progressing   
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
## 2020-02-28 NOTE — ED Notes (Signed)
[place] at bedside 

## 2020-02-28 NOTE — ED Notes (Signed)
IVC, pend BMU admit 

## 2020-02-28 NOTE — ED Notes (Signed)
Called lab to add on new labs ° °

## 2020-02-29 DIAGNOSIS — F3181 Bipolar II disorder: Principal | ICD-10-CM

## 2020-02-29 LAB — HEMOGLOBIN A1C
Hgb A1c MFr Bld: 5.1 % (ref 4.8–5.6)
Mean Plasma Glucose: 99.67 mg/dL

## 2020-02-29 MED ORDER — BUPRENORPHINE HCL-NALOXONE HCL 2-0.5 MG SL SUBL
1.0000 | SUBLINGUAL_TABLET | Freq: Every day | SUBLINGUAL | Status: AC
  Administered 2020-02-29 – 2020-03-01 (×2): 1 via SUBLINGUAL
  Filled 2020-02-29 (×2): qty 1

## 2020-02-29 MED ORDER — OXCARBAZEPINE 300 MG PO TABS
300.0000 mg | ORAL_TABLET | Freq: Every day | ORAL | Status: AC
  Administered 2020-03-01: 300 mg via ORAL
  Filled 2020-02-29: qty 1

## 2020-02-29 MED ORDER — DULOXETINE HCL 30 MG PO CPEP
30.0000 mg | ORAL_CAPSULE | Freq: Every day | ORAL | Status: DC
  Administered 2020-02-29 – 2020-03-01 (×2): 30 mg via ORAL
  Filled 2020-02-29 (×3): qty 1

## 2020-02-29 MED ORDER — FLUOXETINE HCL 20 MG PO CAPS
20.0000 mg | ORAL_CAPSULE | Freq: Every day | ORAL | Status: AC
  Administered 2020-03-01: 20 mg via ORAL
  Filled 2020-02-29: qty 1

## 2020-02-29 MED ORDER — LAMOTRIGINE 25 MG PO TABS
25.0000 mg | ORAL_TABLET | Freq: Every day | ORAL | Status: DC
  Administered 2020-02-29 – 2020-03-07 (×8): 25 mg via ORAL
  Filled 2020-02-29 (×8): qty 1

## 2020-02-29 MED ORDER — BUPROPION HCL ER (XL) 150 MG PO TB24
150.0000 mg | ORAL_TABLET | Freq: Every day | ORAL | Status: AC
  Administered 2020-03-01 – 2020-03-02 (×2): 150 mg via ORAL
  Filled 2020-02-29 (×2): qty 1

## 2020-02-29 NOTE — Progress Notes (Signed)
Recreation Therapy Notes   Date: 02/29/2020  Time: 9:30 am   Location: Craft room     Behavioral response: N/A   Intervention Topic: Stress Management   Discussion/Intervention: Patient did not attend group.   Clinical Observations/Feedback:  Patient did not attend group.   Della Scrivener LRT/CTRS        Emmry Hinsch 02/29/2020 11:26 AM

## 2020-02-29 NOTE — Progress Notes (Signed)
Recreation Therapy Notes  INPATIENT RECREATION THERAPY ASSESSMENT  Patient Details Name: Kirsten Richardson MRN: [id number] DOB: 05-10-1975 Today's Date: 02/29/2020       Information Obtained From: Patient  Able to Participate in Assessment/Interview: Yes  Patient Presentation: Responsive  Reason for Admission (Per Patient): Active Symptoms  Patient Stressors: Other (Comment) (Life)  Coping Skills:   [employer]  Leisure Interests (2+):  Individual - Reading,Individual - TV,Community - Movies,Community - Theater/Cinema,Social - Family  Frequency of Recreation/Participation: Monthly  Awareness of Community Resources:  Yes  Community Resources:  Child psychotherapist  Current Use: Yes  If no, Barriers?:    Expressed Interest in [employer] Information: Yes  County of Residence:  [place]  Patient Main Form of Transportation: Set designer  Patient Strengths:  Database administrator, Values  Patient Identified Areas of Improvement:  Alot of things  Patient Goal for Hospitalization:  Medication and a plan when I get home  Current SI (including self-harm):  No  Current HI:  No  Current AVH: No  Staff Intervention Plan: Group Attendance,Collaborate with Interdisciplinary Treatment Team  Consent to Intern Participation: N/A  Kirsten Richardson 02/29/2020, 1:44 PM

## 2020-02-29 NOTE — Progress Notes (Signed)
Recreation Therapy Notes  INPATIENT RECREATION TR PLAN  Patient Details Name: Kirsten Richardson MRN: [id number] DOB: Jan 31, 1975 Today's Date: 02/29/2020  Rec Therapy Plan Is patient appropriate for Therapeutic Recreation?: Yes Treatment times per week: At least 3 Estimated Length of Stay: 5-7 days TR Treatment/Interventions: Group participation (Comment)  Discharge Criteria Pt will be discharged from therapy if:: Discharged Treatment plan/goals/alternatives discussed and agreed upon by:: Patient/family  Discharge Summary     Kendyll Huettner 02/29/2020, 1:47 PM

## 2020-02-29 NOTE — [facility] Suicide Risk Assessment (Signed)
[facility] Admission Suicide Risk Assessment   Nursing information obtained from:  Patient Demographic factors:  Low socioeconomic status,Unemployed Current Mental Status:  Suicidal ideation indicated by patient (Passive SI) Loss Factors:  Loss of significant relationship Historical Factors:  Impulsivity Risk Reduction Factors:  Positive coping skills or problem solving skills  Total Time spent with patient: 1 hour Principal Problem: Bipolar 2 disorder, major depressive episode (HCC) Diagnosis:  Principal Problem:   Bipolar 2 disorder, major depressive episode (HCC) Active Problems:   Hip pain, chronic   SUI (stress urinary incontinence, female)   Hypothyroidism due to acquired atrophy of thyroid   Essential hypertension   COPD (chronic obstructive pulmonary disease) (HCC)   Constipation   Opiate abuse, continuous (HCC)  Subjective Data: 45 year old female with bipolar II disorder and opiate use disorder presenting with worsening depression and suicidal ideations with plan to slit throat. No acute events overnight, medication compliant, attending to ADLs.  Patient seen during treatment team and again one-on-one at bedside. Her goals are to get on the right medications, and learn how to help herself at home. She notes she has struggled with bipolar 2 disorder and anxiety for several years, however has felt increasingly depressed in the last two weeks. She feels her current medications are not working well for her aside from Risperdal for hallucinations. She ntoes that she is abusing oxycodone roughly 40 mg a day, and she is trading sexual favors with her half brother to receive these. This has been going on roughly 10 years. She feels she would benefit from suboxone, but notes that her provider at [facility] will not give her this medication. She has been on numerous medications in the past and feels Cymbalta and Lamictal was the best combination for her mood. She is open to tapering off current  medications to restart this combination.   Continued Clinical Symptoms:  Alcohol Use Disorder Identification Test Final Score (AUDIT): 0 The "Alcohol Use Disorders Identification Test", Guidelines for Use in Primary Care, Second Edition.  World Science writer [facility]). Score between 0-7:  no or low risk or alcohol related problems. Score between 8-15:  moderate risk of alcohol related problems. Score between 16-19:  high risk of alcohol related problems. Score 20 or above:  warrants further diagnostic evaluation for alcohol dependence and treatment.   CLINICAL FACTORS:   Severe Anxiety and/or Agitation Bipolar Disorder:   Depressive phase Alcohol/Substance Abuse/Dependencies Chronic Pain Currently Psychotic Unstable or Poor Therapeutic Relationship Previous Psychiatric Diagnoses and Treatments Medical Diagnoses and Treatments/Surgeries   Musculoskeletal: Strength & Muscle Tone: within normal limits Gait & Station: normal Patient leans: N/A  Psychiatric Specialty Exam: Physical Exam Vitals and nursing note reviewed.  Constitutional:      Appearance: Normal appearance.  HENT:     Head: Normocephalic and atraumatic.     Right Ear: External ear normal.     Left Ear: External ear normal.     Nose: Nose normal.     Mouth/Throat:     Mouth: Mucous membranes are moist.     Pharynx: Oropharynx is clear.  Eyes:     Extraocular Movements: Extraocular movements intact.     Conjunctiva/sclera: Conjunctivae normal.     Pupils: Pupils are equal, round, and reactive to light.  Cardiovascular:     Rate and Rhythm: Normal rate.     Pulses: Normal pulses.  Pulmonary:     Effort: Pulmonary effort is normal.     Breath sounds: Normal breath sounds.  Abdominal:  General: Abdomen is flat.     Palpations: Abdomen is soft.  Musculoskeletal:        General: No swelling. Normal range of motion.     Cervical back: Normal range of motion and neck supple.  Skin:    General: Skin is  warm and dry.  Neurological:     General: No focal deficit present.     Mental Status: She is alert and oriented to person, place, and time.  Psychiatric:        Attention and Perception: She perceives auditory and visual hallucinations.        Mood and Affect: Mood is depressed. Affect is tearful.        Speech: Speech normal.        Behavior: Behavior is withdrawn.        Thought Content: Thought content includes suicidal ideation.        Cognition and Memory: Cognition is impaired. Memory is impaired.        Judgment: Judgment is impulsive.     Review of Systems  Constitutional: Positive for appetite change and fatigue.  HENT: Negative for rhinorrhea and sore throat.   Eyes: Negative for photophobia and visual disturbance.  Respiratory: Negative for cough and shortness of breath.   Cardiovascular: Negative for chest pain and palpitations.  Gastrointestinal: Positive for constipation. Negative for diarrhea, nausea and vomiting.  Endocrine: Negative for cold intolerance and heat intolerance.  Genitourinary: Negative for difficulty urinating and dysuria.  Musculoskeletal: Positive for arthralgias and myalgias.  Skin: Negative for rash and wound.  Allergic/Immunologic: Negative for food allergies and immunocompromised state.  Neurological: Positive for headaches. Negative for dizziness.  Hematological: Negative for adenopathy. Does not bruise/bleed easily.  Psychiatric/Behavioral: Positive for decreased concentration, hallucinations, sleep disturbance and suicidal ideas.    Blood pressure 124/68, pulse 90, temperature 98.8 F (37.1 C), temperature source Oral, resp. rate 16, height 4\' 11"  (1.499 m), weight 78.9 kg, SpO2 96 %.Body mass index is 35.14 kg/m.  General Appearance: Casual  Eye Contact:  Fair  Speech:  Slow  Volume:  Decreased  Mood:  Anxious and Depressed  Affect:  Congruent and Tearful  Thought Process:  Coherent  Orientation:  Full (Time, Place, and Person)   Thought Content:  Hallucinations: Auditory Visual and Rumination  Suicidal Thoughts:  Yes.  with intent/plan  Homicidal Thoughts:  No  Memory:  Immediate;   Fair  Judgement:  Impaired  Insight:  Shallow  Psychomotor Activity:  Decreased  Concentration:  Concentration: Fair  Recall:  of Knowledge:  Fair  Language:  Fair  Akathisia:  Negative  Handed:  Right  AIMS (if indicated):     Assets:  Desire for Improvement Financial Resources/Insurance Housing Resilience  ADL's:  Intact  Cognition:  Impaired,  Mild  Sleep:  Number of Hours: 8.25         COGNITIVE FEATURES THAT CONTRIBUTE TO RISK:  Closed-mindedness and Loss of executive function    SUICIDE RISK:   Moderate:  Frequent suicidal ideation with limited intensity, and duration, some specificity in terms of plans, no associated intent, good self-control, limited dysphoria/symptomatology, some risk factors present, and identifiable protective factors, including available and accessible social support.  PLAN OF CARE: Continue inpatient admission. See H&P for full details.   I certify that inpatient services furnished can reasonably be expected to improve the patient's condition.   [employer], MD 02/29/2020, 2:49 PM

## 2020-02-29 NOTE — Plan of Care (Signed)
Patient stayed in bed most of the shift. Denies SI,HI and AVH. Patient stated that she is glad she is on the right medications. Support and encouragement given.

## 2020-02-29 NOTE — Tx Team (Signed)
Interdisciplinary Treatment and Diagnostic Plan Update  02/29/2020 Time of Session: 9:00AM Kirsten Richardson MRN: [id number]  Principal Diagnosis: <principal problem not specified>  Secondary Diagnoses: Active Problems:   Bipolar 2 disorder (HCC)   Current Medications:  Current Facility-Administered Medications  Medication Dose Route Frequency Provider Last Rate Last Admin  . acetaminophen (TYLENOL) tablet 650 mg  650 mg Oral Q6H PRN Clapacs, John T, MD      . acetaminophen (TYLENOL) tablet 650 mg  650 mg Oral Q6H PRN Clapacs, John T, MD      . albuterol (VENTOLIN HFA) 108 (90 Base) MCG/ACT inhaler 1-2 puff  1-2 puff Inhalation Q4H PRN Clapacs, John T, MD      . alum & mag hydroxide-simeth (MAALOX/MYLANTA) 200-200-20 MG/5ML suspension 30 mL  30 mL Oral Q4H PRN Clapacs, John T, MD      . buPROPion (WELLBUTRIN XL) 24 hr tablet 300 mg  300 mg Oral Daily Clapacs, Madie Reno, MD   300 mg at 02/29/20 0931  . cyclobenzaprine (FLEXERIL) tablet 10 mg  10 mg Oral QHS Clapacs, Madie Reno, MD   10 mg at 02/28/20 2130  . diclofenac Sodium (VOLTAREN) 1 % topical gel 4 g  4 g Topical QID PRN Clapacs, John T, MD      . famotidine (PEPCID) tablet 20 mg  20 mg Oral BID Clapacs, Madie Reno, MD   20 mg at 02/29/20 0932  . FLUoxetine (PROZAC) capsule 40 mg  40 mg Oral Daily Clapacs, Madie Reno, MD   40 mg at 02/29/20 0934  . folic acid (FOLVITE) tablet 1 mg  1 mg Oral Daily Clapacs, John T, MD   1 mg at 02/29/20 0932  . furosemide (LASIX) tablet 40 mg  40 mg Oral Daily Clapacs, Madie Reno, MD   40 mg at 02/29/20 0933  . gabapentin (NEURONTIN) tablet 300 mg  300 mg Oral TID WC & HS Clapacs, John T, MD   300 mg at 02/29/20 0932  . hydrOXYzine (ATARAX/VISTARIL) tablet 25 mg  25 mg Oral TID PRN Clapacs, Madie Reno, MD      . levothyroxine (SYNTHROID) tablet 75 mcg  75 mcg Oral QAC breakfast Clapacs, Madie Reno, MD   75 mcg at 02/29/20 [phone]  . losartan (COZAAR) tablet 100 mg  100 mg Oral Daily Clapacs, Madie Reno, MD   100 mg at 02/29/20 0935  .  lubiprostone (AMITIZA) capsule 24 mcg  24 mcg Oral Q breakfast Clapacs, Madie Reno, MD   24 mcg at 02/29/20 0935  . magnesium hydroxide (MILK OF MAGNESIA) suspension 30 mL  30 mL Oral Daily PRN Clapacs, John T, MD      . methocarbamol (ROBAXIN) tablet 750 mg  750 mg Oral QID Clapacs, Madie Reno, MD   750 mg at 02/29/20 0933  . mirabegron ER (MYRBETRIQ) tablet 25 mg  25 mg Oral QHS Clapacs, John T, MD      . mometasone-formoterol (DULERA) 200-5 MCG/ACT inhaler 2 puff  2 puff Inhalation BID Clapacs, Madie Reno, MD   2 puff at 02/29/20 [phone]  . ondansetron (ZOFRAN-ODT) disintegrating tablet 4 mg  4 mg Oral Q8H PRN Clapacs, John T, MD      . Oxcarbazepine (TRILEPTAL) tablet 300 mg  300 mg Oral BID Clapacs, Madie Reno, MD   300 mg at 02/29/20 0932  . pantoprazole (PROTONIX) EC tablet 40 mg  40 mg Oral Daily Clapacs, Madie Reno, MD   40 mg at 02/29/20 0931  . risperiDONE (RISPERDAL) tablet  1 mg  1 mg Oral QHS Clapacs, Madie Reno, MD   1 mg at 02/28/20 2130  . topiramate (TOPAMAX) tablet 150 mg  150 mg Oral Daily Clapacs, Madie Reno, MD   150 mg at 02/29/20 0931  . umeclidinium bromide (INCRUSE ELLIPTA) 62.5 MCG/INH 1 puff  1 puff Inhalation Daily Clapacs, Madie Reno, MD   1 puff at 02/29/20 [phone]   PTA Medications: Medications Prior to Admission  Medication Sig Dispense Refill Last Dose  . albuterol (VENTOLIN HFA) 108 (90 Base) MCG/ACT inhaler Inhale 1-2 puffs into the lungs every 4 (four) hours as needed for wheezing or shortness of breath. 1 Inhaler 1   . amphetamine-dextroamphetamine (ADDERALL) 10 MG tablet Take 10 mg by mouth See admin instructions. Take 59m by mouth at 0800 and 1400.     .Marland KitchenbuPROPion (WELLBUTRIN XL) 300 MG 24 hr tablet TAKE 1 TABLET BY MOUTH EVERY DAY (Patient taking differently: Take 300 mg by mouth daily.) 30 tablet 1   . cetirizine (ZYRTEC) 10 MG tablet Take 10 mg by mouth daily.     . COSENTYX SENSOREADY, 300 MG, 150 MG/ML SOAJ Inject 300 mg into the skin every 28 (twenty-eight) days.     . cyclobenzaprine  (FLEXERIL) 10 MG tablet Take 10 mg by mouth at bedtime.     . diclofenac Sodium (VOLTAREN) 1 % GEL Apply 4 g topically 4 (four) times daily as needed (back pain).     . famotidine (PEPCID) 20 MG tablet TAKE 1 TABLET BY MOUTH AT BEDTIME. (Patient taking differently: Take 20 mg by mouth 2 (two) times daily.) 30 tablet 1   . FLUoxetine (PROZAC) 40 MG capsule Take 40 mg by mouth daily.     . folic acid (FOLVITE) 1 MG tablet Take 1 mg by mouth daily.     . furosemide (LASIX) 20 MG tablet Take 40 mg by mouth daily.     .Marland Kitchengabapentin (NEURONTIN) 300 MG capsule Take 1 capsule (300 mg total) by mouth 3 (three) times daily. (Patient not taking: No sig reported) 90 capsule 1   . gabapentin (NEURONTIN) 600 MG tablet Take 300 mg by mouth in the morning, at noon, in the evening, and at bedtime.     .Marland Kitchenlevothyroxine (SYNTHROID) 75 MCG tablet Take 75 mcg by mouth daily before breakfast.     . losartan (COZAAR) 100 MG tablet Take 100 mg by mouth daily.     .Marland Kitchenlubiprostone (AMITIZA) 24 MCG capsule Take 24 mcg by mouth daily with breakfast.     . meloxicam (MOBIC) 15 MG tablet Take 15 mg by mouth daily.     . methocarbamol (ROBAXIN) 750 MG tablet Take 1 tablet (750 mg total) by mouth 4 (four) times daily. 120 tablet 1   . methotrexate (RHEUMATREX) 2.5 MG tablet Take 20 mg by mouth every Friday. (8 tablets)     . MYRBETRIQ 25 MG TB24 tablet Take 25 mg by mouth at bedtime.     .Marland Kitchenomeprazole (PRILOSEC) 40 MG capsule Take 40 mg by mouth daily.     . ondansetron (ZOFRAN ODT) 4 MG disintegrating tablet Take 1 tablet (4 mg total) by mouth every 8 (eight) hours as needed for nausea or vomiting. 20 tablet 0   . Oxcarbazepine (TRILEPTAL) 300 MG tablet Take 300 mg by mouth 2 (two) times daily.     . risperiDONE (RISPERDAL) 1 MG tablet Take 1 mg by mouth at bedtime.     . SYMBICORT 160-4.5 MCG/ACT inhaler Inhale  2 puffs into the lungs 2 (two) times daily.      Marland Kitchen topiramate (TOPAMAX) 50 MG tablet Take 150 mg by mouth daily.     Marland Kitchen  umeclidinium bromide (INCRUSE ELLIPTA) 62.5 MCG/INH AEPB Inhale 1 puff into the lungs daily.       Patient Stressors:    Patient Strengths:    Treatment Modalities: Medication Management, Group therapy, Case management,  1 to 1 session with clinician, Psychoeducation, Recreational therapy.   Physician Treatment Plan for Primary Diagnosis: <principal problem not specified> Long Term Goal(s):     Short Term Goals:    Medication Management: Evaluate patient's response, side effects, and tolerance of medication regimen.  Therapeutic Interventions: 1 to 1 sessions, Unit Group sessions and Medication administration.  Evaluation of Outcomes: Not Met  Physician Treatment Plan for Secondary Diagnosis: Active Problems:   Bipolar 2 disorder ([place])  Long Term Goal(s):     Short Term Goals:       Medication Management: Evaluate patient's response, side effects, and tolerance of medication regimen.  Therapeutic Interventions: 1 to 1 sessions, Unit Group sessions and Medication administration.  Evaluation of Outcomes: Not Met   RN Treatment Plan for Primary Diagnosis: <principal problem not specified> Long Term Goal(s): Knowledge of disease and therapeutic regimen to maintain health will improve  Short Term Goals: Ability to remain free from injury will improve, Ability to verbalize frustration and anger appropriately will improve, Ability to demonstrate self-control, Ability to participate in decision making will improve, Ability to verbalize feelings will improve, Ability to disclose and discuss suicidal ideas, Ability to identify and develop effective coping behaviors will improve and Compliance with prescribed medications will improve  Medication Management: RN will administer medications as ordered by provider, will assess and evaluate patient's response and provide education to patient for prescribed medication. RN will report any adverse and/or side effects to prescribing  provider.  Therapeutic Interventions: 1 on 1 counseling sessions, Psychoeducation, Medication administration, Evaluate responses to treatment, Monitor vital signs and CBGs as ordered, Perform/monitor CIWA, COWS, AIMS and Fall Risk screenings as ordered, Perform wound care treatments as ordered.  Evaluation of Outcomes: Not Met   LCSW Treatment Plan for Primary Diagnosis: <principal problem not specified> Long Term Goal(s): Safe transition to appropriate next level of care at discharge, Engage patient in therapeutic group addressing interpersonal concerns.  Short Term Goals: Engage patient in aftercare planning with referrals and resources, Increase social support, Increase ability to appropriately verbalize feelings, Increase emotional regulation, Facilitate acceptance of mental health diagnosis and concerns, Facilitate patient progression through stages of change regarding substance use diagnoses and concerns, Identify triggers associated with mental health/substance abuse issues and Increase skills for wellness and recovery  Therapeutic Interventions: Assess for all discharge needs, 1 to 1 time with Social worker, Explore available resources and support systems, Assess for adequacy in community support network, Educate family and significant other(s) on suicide prevention, Complete Psychosocial Assessment, Interpersonal group therapy.  Evaluation of Outcomes: Not Met   Progress in Treatment: Attending groups: No. Participating in groups: No. Taking medication as prescribed: Yes. Toleration medication: Yes. Family/Significant other contact made: No, will contact:  when given permission. Patient understands diagnosis: Yes. Discussing patient identified problems/goals with staff: Yes. Medical problems stabilized or resolved: Yes. Denies suicidal/homicidal ideation: Yes. Issues/concerns per patient self-inventory: No. Other: None.  New problem(s) identified: No, Describe:  none.  New  Short Term/Long Term Goal(s): detox, medication management for mood stabilization; elimination of SI thoughts; development of comprehensive mental wellness/sobriety  plan.  Patient Goals: "Meds and a plan for when I get home." "Steps I need to take when I get home."    Discharge Plan or Barriers: CSW will assist with development of an appropriate discharge/aftercare plan.  Reason for Continuation of Hospitalization: Depression Medication stabilization Suicidal ideation Withdrawal symptoms  Estimated Length of Stay: 1-7 days  Attendees: Patient: Kirsten Richardson 02/29/2020 10:22 AM  Physician: Selina Cooley, MD 02/29/2020 10:22 AM  Nursing: West Pugh, RN 02/29/2020 10:22 AM  RN Care Manager: 02/29/2020 10:22 AM  Social Worker: Chalmers Guest. Guerry Bruin, MSW, [place], [place] 02/29/2020 10:22 AM  Recreational Therapist: Devin Going, LRT  02/29/2020 10:22 AM  Other: Assunta Curtis, MSW, LCSW 02/29/2020 10:22 AM  Other: Kiva Martinique, MSW, LCSW-A 02/29/2020 10:22 AM  Other: 02/29/2020 10:22 AM    Scribe for Treatment Team: Shirl Harris, LCSW 02/29/2020 10:22 AM

## 2020-02-29 NOTE — Progress Notes (Signed)
Patient did not get up for breakfast. Patient states " I could not sleep good last night and I have chronic fatigue." Patient stated that none of her medicines working for her. Patient verbalized passive suicidal thoughts, contracts for safety here.Positive for auditory and visual hallucination. States that she is hearing music and seeing bugs crawling on the wall. Patient got up for treatment team. Compliant with medications. Support and encouragement given.

## 2020-02-29 NOTE — [facility] Suicide Risk Assessment (Signed)
[facility] INPATIENT:  Family/Significant Other Suicide Prevention Education  Suicide Prevention Education:  Patient Refusal for Family/Significant Other Suicide Prevention Education: The patient Kirsten Richardson has refused to provide written consent for family/significant other to be provided Family/Significant Other Suicide Prevention Education during admission and/or prior to discharge.  Physician notified.  SPE completed with pt, as pt refused to consent to family contact. SPI pamphlet provided to pt and pt was encouraged to share information with support network, ask questions, and talk about any concerns relating to SPE. Pt denies access to guns/firearms and verbalized understanding of information provided. Mobile Crisis information also provided to pt.    [name] 02/29/2020, 12:51 PM

## 2020-02-29 NOTE — Plan of Care (Signed)
  Problem: Education: Goal: Knowledge of  General Education information/materials will improve Outcome: Progressing Goal: Emotional status will improve Outcome: Progressing Goal: Mental status will improve Outcome: Progressing Goal: Verbalization of understanding the information provided will improve Outcome: Progressing   Problem: Safety: Goal: Periods of time without injury will increase Outcome: Progressing   Problem: Education: Goal: Ability to make informed decisions regarding treatment will improve Outcome: Progressing   Problem: Medication: Goal: Compliance with prescribed medication regimen will improve Outcome: Progressing   
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
## 2020-02-29 NOTE — [facility] Counselor (Signed)
CSW spoke with the patient's CST team.  She reports that patient may have to seek another agency for subaxone.  She reports that the pt moved her psychiatric care to another provider following refusing to take the advice of the [facility] psychiatrist to stop taking medication.   She reports that patient will be able to continue CST.  Penni Homans, MSW, LCSW 02/29/2020 2:11 PM
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
## 2020-03-01 MED ORDER — NICOTINE 21 MG/24HR TD PT24
21.0000 mg | MEDICATED_PATCH | Freq: Every day | TRANSDERMAL | Status: DC
  Administered 2020-03-01 – 2020-03-07 (×7): 21 mg via TRANSDERMAL
  Filled 2020-03-01 (×7): qty 1

## 2020-03-01 MED ORDER — OXCARBAZEPINE 150 MG PO TABS
150.0000 mg | ORAL_TABLET | Freq: Two times a day (BID) | ORAL | Status: AC
  Administered 2020-03-01 – 2020-03-02 (×2): 150 mg via ORAL
  Filled 2020-03-01 (×2): qty 1

## 2020-03-01 MED ORDER — POLYETHYLENE GLYCOL 3350 17 G PO PACK
17.0000 g | PACK | Freq: Every day | ORAL | Status: DC
  Administered 2020-03-01 – 2020-03-07 (×7): 17 g via ORAL
  Filled 2020-03-01 (×7): qty 1

## 2020-03-01 MED ORDER — IBUPROFEN 600 MG PO TABS: 800.0000 mg | ORAL_TABLET | Freq: Three times a day (TID) | ORAL | Status: DC | PRN

## 2020-03-01 MED ORDER — DULOXETINE HCL 30 MG PO CPEP
60.0000 mg | ORAL_CAPSULE | Freq: Every day | ORAL | Status: DC
  Administered 2020-03-02 – 2020-03-07 (×6): 60 mg via ORAL
  Filled 2020-03-01 (×6): qty 2

## 2020-03-01 NOTE — B[facility] Group Notes (Signed)
LCSW Group Therapy Note     03/01/2020 2:37 PM     Type of Therapy/Topic:  Group Therapy:  Balance in Life     Participation Level:  Did Not Attend     Description of Group:    This group will address the concept of balance and how it feels and looks when one is unbalanced. Patients will be encouraged to process areas in their lives that are out of balance and identify reasons for remaining unbalanced. Facilitators will guide patients in utilizing problem-solving interventions to address and correct the stressor making their life unbalanced. Understanding and applying boundaries will be explored and addressed for obtaining and maintaining a balanced life. Patients will be encouraged to explore ways to assertively make their unbalanced needs known to significant others in their lives, using other group members and facilitator for support and feedback.     Therapeutic Goals:  1.      Patient will identify two or more emotions or situations they have that consume much of in their lives.  2.      Patient will identify signs/triggers that life has become out of balance:  3.      Patient will identify two ways to set boundaries in order to achieve balance in their lives:  4.      Patient will demonstrate ability to communicate their needs through discussion and/or role plays     Summary of Patient Progress: X    Therapeutic Modalities:   Cognitive Behavioral Therapy  Solution-Focused Therapy  Assertiveness Training     Kiva Swaziland MSW, LCSW-A  03/01/2020 2:37 PM

## 2020-03-01 NOTE — [facility] Group Notes (Signed)
[facility] Group Notes:  (Nursing/MHT/Case Management/Adjunct)  Date:  03/01/2020  Time:  9:14 AM  Type of Therapy:  Community Meeting  Participation Level:  Active  Participation Quality:  Appropriate and Attentive  Affect:  Appropriate  Cognitive:  Alert and Appropriate  Insight:  Appropriate  Engagement in Group:  Engaged  Modes of Intervention:  Discussion, Education and Support  Summary of Progress/Problems:  Lynelle Smoke [facility] 03/01/2020, 9:14 AM

## 2020-03-01 NOTE — Progress Notes (Signed)
Pt rates her depression, anxiety and hopelessness all 9/10. Reports she slept well with good appetite, low energy and poor concentration level. Denies HI, AVH and pain when assessed "My regular medicine help". Endorsed SI with plan to "cut my throat". Pt verbally contracts for safety. Pt observed to be very somatic and needy this shift "I'm dizzy". Pt educated on safety related to falls risk / position changes. Vitals done & WNL. Scheduled medications given as ordered with verbal education and effects monitored. Emotional support, encouragement and reassurance provided to pt throughout this shift. Safety checks maintained at Q 15 minutes intervals without self harm gestures or outburst.  Pt attended scheduled groups and was engaged in activities. Cooperative with care and unit routines. Denies concerns at this time.
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
## 2020-03-01 NOTE — Progress Notes (Signed)
Patient has been isolative to her room. She did come out for snack and QHS meds without being prompted.  She is pleasant and cooperative and easy to engage when approached. She denies SI  HI AVH depression and anxiety at this encounter.  She is med compliant and tolerated her meds without incident.  She is safe on  The unit with 15 minute safety checks and encouraged to come to staff with any concerns.     Cleo Butler-Nicholson,LPN

## 2020-03-01 NOTE — B[facility] Group Notes (Signed)
[facility] Group Notes:  (Nursing/MHT/Case Management/Adjunct)  Date:  03/01/2020  Time:  9:41 PM  Type of Therapy:  Group Therapy  Participation Level:  Did Not Attend   Kirsten Richardson 03/01/2020, 9:41 PM
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
## 2020-03-02 NOTE — [facility] Group Notes (Signed)
LCSW Group Therapy Note  03/02/2020 3:27 PM  Type of Therapy and Topic:  Group Therapy:  Feelings around Relapse and Recovery  Participation Level:  Did Not Attend   Description of Group:    Patients in this group will discuss emotions they experience before and after a relapse. They will process how experiencing these feelings, or avoidance of experiencing them, relates to having a relapse. Facilitator will guide patients to explore emotions they have related to recovery. Patients will be encouraged to process which emotions are more powerful. They will be guided to discuss the emotional reaction significant others in their lives may have to their relapse or recovery. Patients will be assisted in exploring ways to respond to the emotions of others without this contributing to a relapse.  Therapeutic Goals: 1. Patient will identify two or more emotions that lead to a relapse for them 2. Patient will identify two emotions that result when they relapse 3. Patient will identify two emotions related to recovery 4. Patient will demonstrate ability to communicate their needs through discussion and/or role plays   Summary of Patient Progress: X  Therapeutic Modalities:   Cognitive Behavioral Therapy Solution-Focused Therapy Assertiveness Training Relapse Prevention Therapy   Penni Homans, MSW, LCSW 03/02/2020 3:27 PM

## 2020-03-02 NOTE — Progress Notes (Signed)
Patient alert and oriented x 4, affect is blunted thoughts are organized and coherent no distress noted ,she denies SI/HI/AVH, she appears less anxious, still isolates to self, initiates conversation and receptive to staff, complaint with night time medication, 15 minutes safety cheeks maintained will continue to monitor closely.

## 2020-03-02 NOTE — Progress Notes (Signed)
Patient alert and oriented x 4, affect is blunted thoughts are organized and coherent no distress noted , she denies SI/HI/AVH, she appears less anxious  interacting appropriately with peers and staff, 15 minutes safety cheeks maintained will continue to monitor closely .

## 2020-03-02 NOTE — Progress Notes (Signed)
Pt is alert and oriented to person, place, time and situation. Pt is calm, cooperative, denies homicidal ideation, initially denied suicidal ideation during morning medication pass, but later reported to the MD and to the nursing students that she has suicidal ideation, with plan to cut herself. Pt verbally agrees to come talk to staff if she feels like she may act on her thoughts to harm self. Pt is tearful when talking to the nursing students. Pt reports feelings of depression, rates it 8/10 on a 0-10 scale, 10 being worst, denies anxiety. Pt is out for meals, is medication compliant, affect flat. Will continue to monitor pt per Q15 minute face checks and monitor for safety and progress.

## 2020-03-02 NOTE — [facility] Group Notes (Signed)
[facility] Group Notes:  (Nursing/MHT/Case Management/Adjunct)  Date:  03/02/2020  Time:  9:51 AM  Type of Therapy:  Community Meeting  Participation Level:  Active  Participation Quality:  Appropriate  Affect:  Appropriate  Cognitive:  Appropriate  Insight:  Appropriate  Engagement in Group:  Engaged  Modes of Intervention:  Activity and Discussion  Summary of Progress/Problems:  Lynelle Smoke [facility] 03/02/2020, 9:51 AM

## 2020-03-02 NOTE — Progress Notes (Signed)
Recreation Therapy Notes  Date: 02/29/2020  Time: 9:30 am   Location: Craft room     Behavioral response: N/A   Intervention Topic: Happiness   Discussion/Intervention: Patient did not attend group.   Clinical Observations/Feedback:  Patient did not attend group.   Haivyn Oravec LRT/CTRS        Cortney Mckinney 03/02/2020 12:30 PM

## 2020-03-03 DIAGNOSIS — K5900 Constipation, unspecified: Secondary | ICD-10-CM

## 2020-03-03 DIAGNOSIS — N393 Stress incontinence (female) (male): Secondary | ICD-10-CM

## 2020-03-03 DIAGNOSIS — E034 Atrophy of thyroid (acquired): Secondary | ICD-10-CM

## 2020-03-03 DIAGNOSIS — M25552 Pain in left hip: Secondary | ICD-10-CM

## 2020-03-03 DIAGNOSIS — F1110 Opioid abuse, uncomplicated: Secondary | ICD-10-CM

## 2020-03-03 DIAGNOSIS — R519 Headache, unspecified: Secondary | ICD-10-CM

## 2020-03-03 DIAGNOSIS — I10 Essential (primary) hypertension: Secondary | ICD-10-CM

## 2020-03-03 DIAGNOSIS — G8929 Other chronic pain: Secondary | ICD-10-CM

## 2020-03-03 DIAGNOSIS — J449 Chronic obstructive pulmonary disease, unspecified: Secondary | ICD-10-CM

## 2020-03-03 MED ORDER — RISPERIDONE 1 MG PO TABS
2.0000 mg | ORAL_TABLET | Freq: Every day | ORAL | Status: DC
  Administered 2020-03-03 – 2020-03-06 (×4): 2 mg via ORAL
  Filled 2020-03-03 (×4): qty 2

## 2020-03-03 MED ORDER — GABAPENTIN 300 MG PO CAPS: 300.0000 mg | ORAL_CAPSULE | Freq: Three times a day (TID) | ORAL | Status: DC

## 2020-03-03 MED ORDER — IBUPROFEN 600 MG PO TABS
800.0000 mg | ORAL_TABLET | Freq: Three times a day (TID) | ORAL | Status: DC | PRN
  Administered 2020-03-03 – 2020-03-04 (×2): 800 mg via ORAL
  Filled 2020-03-03 (×2): qty 1

## 2020-03-03 MED ORDER — GABAPENTIN 300 MG PO CAPS
300.0000 mg | ORAL_CAPSULE | Freq: Three times a day (TID) | ORAL | Status: DC
Start: 2020-03-03 — End: 2020-03-07
  Administered 2020-03-03 – 2020-03-07 (×15): 300 mg via ORAL
  Filled 2020-03-03 (×15): qty 1
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
## 2020-03-03 NOTE — Progress Notes (Addendum)
Pt is alert and oriented to person, place, time and situation. Pt is calm, cooperative, reports passive SI at this time, no plan, reports she feels safe in the hospital. Pt is pleasant, affect is flat, eye contact is fair, reports feelings of depression, rates it 7/10 on a 0-10. Pt isolates in her room often, sits in bed quietly. Pt forwards little. Pt denies anxiety, denies hallucinations, denies homicidal ideation. Pt is medication compliant. Will continue to monitor pt per Q15 minute face checks and monitor for safety and progress.    At 18:34 follow up with pt regarding suicidal ideation. Pt states, "I'm not having any right now."

## 2020-03-03 NOTE — Plan of Care (Signed)
  Problem: Education: Goal: Knowledge of  General Education information/materials will improve Outcome: Progressing Goal: Emotional status will improve Outcome: Progressing Goal: Mental status will improve Outcome: Progressing Goal: Verbalization of understanding the information provided will improve Outcome: Progressing   Problem: Safety: Goal: Periods of time without injury will increase Outcome: Progressing   Problem: Education: Goal: Ability to make informed decisions regarding treatment will improve Outcome: Progressing   Problem: Medication: Goal: Compliance with prescribed medication regimen will improve Outcome: Progressing   

## 2020-03-03 NOTE — [facility] Group Notes (Signed)
[facility] LCSW Group Therapy Note  Date/Time:  03/03/2020 1:14 PM-2:00 PM   Type of Therapy and Topic:  Group Therapy:  Healthy and Unhealthy Supports  Participation Level:  Active   Description of Group:  Patients in this group were introduced to the idea of adding a variety of healthy supports to address the various needs in their lives.Patients discussed what additional healthy supports could be helpful in their recovery and wellness after discharge in order to prevent future hospitalizations.   An emphasis was placed on using counselor, doctor, therapy groups, 12-step groups, and problem-specific support groups to expand supports.  They also worked as a group on developing a specific plan for several patients to deal with unhealthy supports through boundary-setting, psychoeducation with loved ones, and even termination of relationships.   Therapeutic Goals:   1)  discuss importance of adding supports to stay well once out of the hospital  2)  compare healthy versus unhealthy supports and identify some examples of each  3)  generate ideas and descriptions of healthy supports that can be added  4)  offer mutual support about how to address unhealthy supports  5)  encourage active participation in and adherence to discharge plan    Summary of Patient Progress:  Patient spoke about peer support and how they did not help her. Patient spoke about praying as a support in her life.   Therapeutic Modalities:   Motivational Interviewing Brief Solution-Focused Therapy  Susa Simmonds, Theresia Majors 03/03/2020  4:17 PM

## 2020-03-04 DIAGNOSIS — M5030 Other cervical disc degeneration, unspecified cervical region: Secondary | ICD-10-CM

## 2020-03-04 DIAGNOSIS — E6609 Other obesity due to excess calories: Secondary | ICD-10-CM

## 2020-03-04 DIAGNOSIS — F603 Borderline personality disorder: Secondary | ICD-10-CM

## 2020-03-04 DIAGNOSIS — M5136 Other intervertebral disc degeneration, lumbar region: Secondary | ICD-10-CM

## 2020-03-04 DIAGNOSIS — Z6838 Body mass index (BMI) 38.0-38.9, adult: Secondary | ICD-10-CM

## 2020-03-04 NOTE — Progress Notes (Signed)
Pt is alert and oriented to person, place, time and situation. Pt is calm, cooperative, denies suicidal and homicidal ideation, denies hallucinations. Pt's affect remains flat, reports, "I always feel depressed and anxious. But I'm doing better today." Pt rates depression 5/10 on a 0-10 scale, 10 being worst. Pt rates anxiety 4/10 on a 0-10 scale, 10 being worst. Pt attended social worker group. Pt is visible on the unit and out in the dayroom for meals, appetite is good, reports she slept "okay." Pt is medication complaint. Pt requested and was given Advil for c/o generalized pain caused by her "fybromyalgia." Pt also reported that she had a normal bowel movement yesterday after being constipated and taking MOM PO PRN for it. Pt encouraged to continue to spends time out of her room and participate in unit programming, at times pt isolates at times in her room, sitting quietly in bed. Will continue to monitor pt per Q15 minute face checks and monitor for safety and progress.
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
## 2020-03-04 NOTE — Progress Notes (Signed)
Patient is alert and oriented. She continues to endorse passive SI , but does not have a plan. She denies HI  AVH and anxiety at this encounter. She does report depression rating 7/10 and appears to have flat and depressed affect. She is med compliant and tolerated her meds without incident. She is safe on the unit with 15 minute safety checks and was encouraged to come to staff with any concerns.   Cleo Butler-Nicholson, LPN

## 2020-03-04 NOTE — [facility] Group Notes (Signed)
LCSW Group Therapy 03/04/20: 1:20 PM- 2:15 PM    Type of Therapy and Topic:  Group Therapy:  Setting Goals   Participation Level:  Minimal    Description of Group: In this process group, patients discussed using strengths to work toward goals and address challenges.  Patients identified two positive things about themselves and one goal they were working on.  Patients were given the opportunity to share openly and support each other's plan for self-empowerment.  The group discussed the value of gratitude and were encouraged to have a daily reflection of positive characteristics or circumstances.  Patients were encouraged to identify a plan to utilize their strengths to work on current challenges and goals.   Therapeutic Goals 1. Patient will verbalize personal strengths/positive qualities and relate how these can assist with achieving desired personal goals 2. Patients will verbalize affirmation of peers plans for personal change and goal setting 3. Patients will explore the value of gratitude and positive focus as related to successful achievement of goals 4. Patients will verbalize a plan for regular reinforcement of personal positive qualities and circumstances.   Summary of Patient Progress: Patient was quiet during group and told CSW that she has no personal strengths. CSW was able to get patient to identify cooking as a strength of hers. Patient participated in the goal planning worksheet.        Therapeutic Modalities Cognitive Behavioral Therapy Motivational Interviewing

## 2020-03-04 NOTE — Plan of Care (Signed)
  Problem: Education: Goal: Knowledge of [facility] Education information/materials will improve Outcome: Progressing Goal: Emotional status will improve Outcome: Progressing Goal: Mental status will improve Outcome: Progressing Goal: Verbalization of understanding the information provided will improve Outcome: Progressing   Problem: Safety: Goal: Periods of time without injury will increase Outcome: Progressing   Problem: Education: Goal: Ability to make informed decisions regarding treatment will improve Outcome: Progressing   Problem: Medication: Goal: Compliance with prescribed medication regimen will improve Outcome: Progressing   

## 2020-03-05 NOTE — [facility] Group Notes (Signed)
LCSW Group Therapy Note   03/05/2020 2:15 PM  Type of Therapy and Topic:  Group Therapy:  Overcoming Obstacles   Participation Level:  Did Not Attend   Description of Group:    In this group patients will be encouraged to explore what they see as obstacles to their own wellness and recovery. They will be guided to discuss their thoughts, feelings, and behaviors related to these obstacles. The group will process together ways to cope with barriers, with attention given to specific choices patients can make. Each patient will be challenged to identify changes they are motivated to make in order to overcome their obstacles. This group will be process-oriented, with patients participating in exploration of their own experiences as well as giving and receiving support and challenge from other group members.   Therapeutic Goals: 1. Patient will identify personal and current obstacles as they relate to admission. 2. Patient will identify barriers that currently interfere with their wellness or overcoming obstacles.  3. Patient will identify feelings, thought process and behaviors related to these barriers. 4. Patient will identify two changes they are willing to make to overcome these obstacles:      Summary of Patient Progress X    Therapeutic Modalities:   Cognitive Behavioral Therapy Solution Focused Therapy Motivational Interviewing Relapse Prevention Therapy  Penni Homans, MSW, LCSW 03/05/2020 2:15 PM
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
## 2020-03-05 NOTE — [facility] Group Notes (Signed)
[facility] Group Notes:  (Nursing/MHT/Case Management/Adjunct)  Date:  03/05/2020  Time:  9:08 PM  Type of Therapy:  Group Therapy  Participation Level:  Active  Participation Quality:  Appropriate  Affect:  Appropriate  Cognitive:  Alert  Insight:  Good  Engagement in Group:  Engaged  Modes of Intervention:  Support  Summary of Progress/Problems:  Kirsten Richardson 03/05/2020, 9:08 PM

## 2020-03-05 NOTE — Progress Notes (Signed)
Patient presents with a flat affect but is pleasant and easy to engage in conversation.  She reports feeling better today with less depression and anxiety.  She has been active on the unit hanging out with other patients in the dayroom.  She is med compliant and tolerated her meds without incident. She is safe on the unit with 15 minute safety checks and was encouraged to come to staff with any concerns.     Cleo Butler-Nicholson, LPN

## 2020-03-05 NOTE — Plan of Care (Signed)
  Problem: Education: Goal: Knowledge of [place] Education information/materials will improve Outcome: Progressing Goal: Emotional status will improve Outcome: Progressing Goal: Mental status will improve Outcome: Progressing Goal: Verbalization of understanding the information provided will improve Outcome: Progressing   Problem: Safety: Goal: Periods of time without injury will increase Outcome: Progressing   Problem: Education: Goal: Ability to make informed decisions regarding treatment will improve Outcome: Progressing   Problem: Medication: Goal: Compliance with prescribed medication regimen will improve Outcome: Progressing   

## 2020-03-05 NOTE — Tx Team (Signed)
Interdisciplinary Treatment and Diagnostic Plan Update  03/05/2020 Time of Session: 8:30AM Kirsten Richardson MRN: [id number]  Principal Diagnosis: Bipolar 2 disorder, major depressive episode (HCC)  Secondary Diagnoses: Principal Problem:   Bipolar 2 disorder, major depressive episode (HCC) Active Problems:   Hip pain, chronic   Borderline personality disorder (HCC)   DDD (degenerative disc disease), lumbar   DDD (degenerative disc disease), cervical   SUI (stress urinary incontinence, female)   Hypothyroidism due to acquired atrophy of thyroid   Essential hypertension   COPD (chronic obstructive pulmonary disease) (HCC)   Constipation   Opiate abuse, continuous (HCC)   Class 2 obesity due to excess calories without serious comorbidity with body mass index (BMI) of 38.0 to 38.9 in adult   Headache   Current Medications:  Current Facility-Administered Medications  Medication Dose Route Frequency Provider Last Rate Last Admin  . acetaminophen (TYLENOL) tablet 650 mg  650 mg Oral Q6H PRN Clapacs, John T, MD      . albuterol (VENTOLIN HFA) 108 (90 Base) MCG/ACT inhaler 1-2 puff  1-2 puff Inhalation Q4H PRN Clapacs, John T, MD      . alum & mag hydroxide-simeth (MAALOX/MYLANTA) 200-200-20 MG/5ML suspension 30 mL  30 mL Oral Q4H PRN Clapacs, John T, MD      . cyclobenzaprine (FLEXERIL) tablet 10 mg  10 mg Oral QHS Clapacs, Jackquline Denmark, MD   10 mg at 03/04/20 2119  . diclofenac Sodium (VOLTAREN) 1 % topical gel 4 g  4 g Topical QID PRN Clapacs, John T, MD      . DULoxetine (CYMBALTA) DR capsule 60 mg  60 mg Oral Daily Jesse Sans, MD   60 mg at 03/05/20 0743  . famotidine (PEPCID) tablet 20 mg  20 mg Oral BID Clapacs, Jackquline Denmark, MD   20 mg at 03/05/20 0745  . folic acid (FOLVITE) tablet 1 mg  1 mg Oral Daily Clapacs, John T, MD   1 mg at 03/05/20 0745  . furosemide (LASIX) tablet 40 mg  40 mg Oral Daily Clapacs, Jackquline Denmark, MD   40 mg at 03/05/20 0745  . gabapentin (NEURONTIN) capsule 300 mg  300  mg Oral TID WC & HS Clapacs, John T, MD   300 mg at 03/05/20 0744  . hydrOXYzine (ATARAX/VISTARIL) tablet 25 mg  25 mg Oral TID PRN Clapacs, John T, MD      . ibuprofen (ADVIL) tablet 800 mg  800 mg Oral TID PRN Mariel Craft, MD   800 mg at 03/04/20 1137  . lamoTRIgine (LAMICTAL) tablet 25 mg  25 mg Oral Daily Jesse Sans, MD   25 mg at 03/05/20 0743  . levothyroxine (SYNTHROID) tablet 75 mcg  75 mcg Oral QAC breakfast Clapacs, Jackquline Denmark, MD   75 mcg at 03/05/20 0608  . losartan (COZAAR) tablet 100 mg  100 mg Oral Daily Clapacs, Jackquline Denmark, MD   100 mg at 03/05/20 0745  . lubiprostone (AMITIZA) capsule 24 mcg  24 mcg Oral Q breakfast Clapacs, Jackquline Denmark, MD   24 mcg at 03/05/20 [phone]  . magnesium hydroxide (MILK OF MAGNESIA) suspension 30 mL  30 mL Oral Daily PRN Clapacs, Jackquline Denmark, MD   30 mL at 03/05/20 0755  . methocarbamol (ROBAXIN) tablet 750 mg  750 mg Oral QID Clapacs, Jackquline Denmark, MD   750 mg at 03/05/20 0744  . mirabegron ER (MYRBETRIQ) tablet 25 mg  25 mg Oral QHS Clapacs, Jackquline Denmark, MD   25  mg at 03/04/20 2120  . mometasone-formoterol (DULERA) 200-5 MCG/ACT inhaler 2 puff  2 puff Inhalation BID Clapacs, John T, MD   2 puff at 03/05/20 0747  . nicotine (NICODERM CQ - dosed in mg/24 hours) patch 21 mg  21 mg Transdermal Daily Jesse Sans, MD   21 mg at 03/05/20 0745  . ondansetron (ZOFRAN-ODT) disintegrating tablet 4 mg  4 mg Oral Q8H PRN Clapacs, John T, MD      . pantoprazole (PROTONIX) EC tablet 40 mg  40 mg Oral Daily Clapacs, Jackquline Denmark, MD   40 mg at 03/05/20 0744  . polyethylene glycol (MIRALAX / GLYCOLAX) packet 17 g  17 g Oral Daily Jesse Sans, MD   17 g at 03/05/20 0745  . risperiDONE (RISPERDAL) tablet 2 mg  2 mg Oral QHS Mariel Craft, MD   2 mg at 03/04/20 2121  . topiramate (TOPAMAX) tablet 150 mg  150 mg Oral Daily Clapacs, Jackquline Denmark, MD   150 mg at 03/05/20 0744  . umeclidinium bromide (INCRUSE ELLIPTA) 62.5 MCG/INH 1 puff  1 puff Inhalation Daily Clapacs, Jackquline Denmark, MD   1 puff at  03/05/20 0747   PTA Medications: Medications Prior to Admission  Medication Sig Dispense Refill Last Dose  . albuterol (VENTOLIN HFA) 108 (90 Base) MCG/ACT inhaler Inhale 1-2 puffs into the lungs every 4 (four) hours as needed for wheezing or shortness of breath. 1 Inhaler 1   . amphetamine-dextroamphetamine (ADDERALL) 10 MG tablet Take 10 mg by mouth See admin instructions. Take 10mg  by mouth at 0800 and 1400.     buPROPion (WELLBUTRIN XL) 300 MG 24 hr tablet TAKE 1 TABLET BY MOUTH EVERY DAY (Patient taking differently: Take 300 mg by mouth daily.) 30 tablet 1   . cetirizine (ZYRTEC) 10 MG tablet Take 10 mg by mouth daily.     . COSENTYX SENSOREADY, 300 MG, 150 MG/ML SOAJ Inject 300 mg into the skin every 28 (twenty-eight) days.     . cyclobenzaprine (FLEXERIL) 10 MG tablet Take 10 mg by mouth at bedtime.     . diclofenac Sodium (VOLTAREN) 1 % GEL Apply 4 g topically 4 (four) times daily as needed (back pain).     . famotidine (PEPCID) 20 MG tablet TAKE 1 TABLET BY MOUTH AT BEDTIME. (Patient taking differently: Take 20 mg by mouth 2 (two) times daily.) 30 tablet 1   . FLUoxetine (PROZAC) 40 MG capsule Take 40 mg by mouth daily.     . folic acid (FOLVITE) 1 MG tablet Take 1 mg by mouth daily.     . furosemide (LASIX) 20 MG tablet Take 40 mg by mouth daily.     Marland Kitchen gabapentin (NEURONTIN) 300 MG capsule Take 1 capsule (300 mg total) by mouth 3 (three) times daily. (Patient not taking: No sig reported) 90 capsule 1   . gabapentin (NEURONTIN) 600 MG tablet Take 300 mg by mouth in the morning, at noon, in the evening, and at bedtime.     Marland Kitchen levothyroxine (SYNTHROID) 75 MCG tablet Take 75 mcg by mouth daily before breakfast.     . losartan (COZAAR) 100 MG tablet Take 100 mg by mouth daily.     Marland Kitchen lubiprostone (AMITIZA) 24 MCG capsule Take 24 mcg by mouth daily with breakfast.     . meloxicam (MOBIC) 15 MG tablet Take 15 mg by mouth daily.     . methocarbamol (ROBAXIN) 750 MG tablet Take 1 tablet (750  mg total)  by mouth 4 (four) times daily. 120 tablet 1   . methotrexate (RHEUMATREX) 2.5 MG tablet Take 20 mg by mouth every Friday. (8 tablets)     . MYRBETRIQ 25 MG TB24 tablet Take 25 mg by mouth at bedtime.     Marland Kitchen omeprazole (PRILOSEC) 40 MG capsule Take 40 mg by mouth daily.     . ondansetron (ZOFRAN ODT) 4 MG disintegrating tablet Take 1 tablet (4 mg total) by mouth every 8 (eight) hours as needed for nausea or vomiting. 20 tablet 0   . Oxcarbazepine (TRILEPTAL) 300 MG tablet Take 300 mg by mouth 2 (two) times daily.     . risperiDONE (RISPERDAL) 1 MG tablet Take 1 mg by mouth at bedtime.     . SYMBICORT 160-4.5 MCG/ACT inhaler Inhale 2 puffs into the lungs 2 (two) times daily.      Marland Kitchen topiramate (TOPAMAX) 50 MG tablet Take 150 mg by mouth daily.     Marland Kitchen umeclidinium bromide (INCRUSE ELLIPTA) 62.5 MCG/INH AEPB Inhale 1 puff into the lungs daily.       Patient Stressors:    Patient Strengths:    Treatment Modalities: Medication Management, Group therapy, Case management,  1 to 1 session with clinician, Psychoeducation, Recreational therapy.   Physician Treatment Plan for Primary Diagnosis: Bipolar 2 disorder, major depressive episode (HCC) Long Term Goal(s): Improvement in symptoms so as ready for discharge Improvement in symptoms so as ready for discharge   Short Term Goals: Ability to identify changes in lifestyle to reduce recurrence of condition will improve Ability to verbalize feelings will improve Ability to disclose and discuss suicidal ideas Ability to demonstrate self-control will improve Ability to identify and develop effective coping behaviors will improve Compliance with prescribed medications will improve Ability to identify triggers associated with substance abuse/mental health issues will improve Ability to identify changes in lifestyle to reduce recurrence of condition will improve Ability to verbalize feelings will improve Ability to disclose and discuss suicidal  ideas Ability to demonstrate self-control will improve Ability to identify and develop effective coping behaviors will improve Compliance with prescribed medications will improve Ability to identify triggers associated with substance abuse/mental health issues will improve  Medication Management: Evaluate patient's response, side effects, and tolerance of medication regimen.  Therapeutic Interventions: 1 to 1 sessions, Unit Group sessions and Medication administration.  Evaluation of Outcomes: Progressing  Physician Treatment Plan for Secondary Diagnosis: Principal Problem:   Bipolar 2 disorder, major depressive episode (HCC) Active Problems:   Hip pain, chronic   Borderline personality disorder (HCC)   DDD (degenerative disc disease), lumbar   DDD (degenerative disc disease), cervical   SUI (stress urinary incontinence, female)   Hypothyroidism due to acquired atrophy of thyroid   Essential hypertension   COPD (chronic obstructive pulmonary disease) (HCC)   Constipation   Opiate abuse, continuous (HCC)   Class 2 obesity due to excess calories without serious comorbidity with body mass index (BMI) of 38.0 to 38.9 in adult   Headache  Long Term Goal(s): Improvement in symptoms so as ready for discharge Improvement in symptoms so as ready for discharge   Short Term Goals: Ability to identify changes in lifestyle to reduce recurrence of condition will improve Ability to verbalize feelings will improve Ability to disclose and discuss suicidal ideas Ability to demonstrate self-control will improve Ability to identify and develop effective coping behaviors will improve Compliance with prescribed medications will improve Ability to identify triggers associated with substance abuse/mental health issues will improve Ability  to identify changes in lifestyle to reduce recurrence of condition will improve Ability to verbalize feelings will improve Ability to disclose and discuss suicidal  ideas Ability to demonstrate self-control will improve Ability to identify and develop effective coping behaviors will improve Compliance with prescribed medications will improve Ability to identify triggers associated with substance abuse/mental health issues will improve     Medication Management: Evaluate patient's response, side effects, and tolerance of medication regimen.  Therapeutic Interventions: 1 to 1 sessions, Unit Group sessions and Medication administration.  Evaluation of Outcomes: Progressing   RN Treatment Plan for Primary Diagnosis: Bipolar 2 disorder, major depressive episode (HCC) Long Term Goal(s): Knowledge of disease and therapeutic regimen to maintain health will improve  Short Term Goals: Ability to remain free from injury will improve, Ability to verbalize frustration and anger appropriately will improve, Ability to demonstrate self-control, Ability to participate in decision making will improve, Ability to verbalize feelings will improve, Ability to disclose and discuss suicidal ideas, Ability to identify and develop effective coping behaviors will improve and Compliance with prescribed medications will improve  Medication Management: RN will administer medications as ordered by provider, will assess and evaluate patient's response and provide education to patient for prescribed medication. RN will report any adverse and/or side effects to prescribing provider.  Therapeutic Interventions: 1 on 1 counseling sessions, Psychoeducation, Medication administration, Evaluate responses to treatment, Monitor vital signs and CBGs as ordered, Perform/monitor CIWA, COWS, AIMS and Fall Risk screenings as ordered, Perform wound care treatments as ordered.  Evaluation of Outcomes: Progressing   LCSW Treatment Plan for Primary Diagnosis: Bipolar 2 disorder, major depressive episode (HCC) Long Term Goal(s): Safe transition to appropriate next level of care at discharge, Engage  patient in therapeutic group addressing interpersonal concerns.  Short Term Goals: Engage patient in aftercare planning with referrals and resources, Increase social support, Increase ability to appropriately verbalize feelings, Increase emotional regulation, Facilitate acceptance of mental health diagnosis and concerns, Facilitate patient progression through stages of change regarding substance use diagnoses and concerns, Identify triggers associated with mental health/substance abuse issues and Increase skills for wellness and recovery  Therapeutic Interventions: Assess for all discharge needs, 1 to 1 time with Social worker, Explore available resources and support systems, Assess for adequacy in community support network, Educate family and significant other(s) on suicide prevention, Complete Psychosocial Assessment, Interpersonal group therapy.  Evaluation of Outcomes: Progressing   Progress in Treatment: Attending groups: Yes. Participating in groups: Yes. Taking medication as prescribed: Yes. Toleration medication: Yes. Family/Significant other contact made: No, will contact:  pt declined Patient understands diagnosis: Yes. Discussing patient identified problems/goals with staff: Yes. Medical problems stabilized or resolved: Yes. Denies suicidal/homicidal ideation: Yes. Issues/concerns per patient self-inventory: No. Other: None.  New problem(s) identified: No, Describe:  none.  New Short Term/Long Term Goal(s): detox, medication management for mood stabilization; elimination of SI thoughts; development of comprehensive mental wellness/sobriety plan. Update 03/05/20: None  Patient Goals: "Meds and a plan for when I get home." "Steps I need to take when I get home."   Update 03/05/20: None  Discharge Plan or Barriers: CSW will assist with development of an appropriate discharge/aftercare plan. Update 03/05/20: None  Reason for Continuation of Hospitalization: Depression Medication  stabilization Suicidal ideation Withdrawal symptoms  Estimated Length of Stay: 1-7 days  Attendees: Patient:  03/05/2020 10:07 AM  Physician: Les Pou, MD 03/05/2020 10:07 AM  Nursing:  03/05/2020 10:07 AM  RN Care Manager: 03/05/2020 10:07 AM  Social Worker: 03/05/2020 10:07 AM  Recreational Therapist:  03/05/2020 10:07 AM  Other: Penni Homans, MSW, LCSW 03/05/2020 10:07 AM  Other: Wilmer Santillo [place], MSW, LCSW-A 03/05/2020 10:07 AM  Other: 03/05/2020 10:07 AM    Scribe for Treatment Team: Illias Pantano A [place], LCSWA 03/05/2020 10:07 AM

## 2020-03-05 NOTE — Progress Notes (Signed)
Recreation Therapy Notes   Date: 03/05/2020  Time: 9:30 am   Location: Craft room     Behavioral response: N/A   Intervention Topic: Self-esteem   Discussion/Intervention: Patient did not attend group.   Clinical Observations/Feedback:  Patient did not attend group.   Manpreet Kemmer LRT/CTRS        Kirsten Richardson 03/05/2020 10:56 AM 

## 2020-03-05 NOTE — Plan of Care (Signed)
Patient rated her depression 9/10 and anxiety 7/10. Patient stated that her depression is " little " better but not ready for discharge. Patient stated that she likes to go to a place where she can get Suboxine. Patient verbalized passive suicidal thoughts,contracts for safety in the unit. Denies HI and AVH. Patient stayed in bed most of the shift. Did not attend groups. Compliant with medications. Appetite and energy level good.Support and encouragement given.

## 2020-03-06 LAB — BASIC METABOLIC PANEL
Anion gap: 7 (ref 5–15)
BUN: 14 mg/dL (ref 6–20)
CO2: 24 mmol/L (ref 22–32)
Calcium: 9 mg/dL (ref 8.9–10.3)
Chloride: 109 mmol/L (ref 98–111)
Creatinine, Ser: 1.04 mg/dL — ABNORMAL HIGH (ref 0.44–1.00)
GFR, Estimated: >60 mL/min (ref >60)
Glucose, Bld: 82 mg/dL (ref 70–99)
Potassium: 3.9 mmol/L (ref 3.5–5.1)
Sodium: 140 mmol/L (ref 135–145)

## 2020-03-06 LAB — IRON AND TIBC
Iron: 56 ug/dL (ref 28–170)
Saturation Ratios: 18 % (ref 10.4–31.8)
TIBC: 316 ug/dL (ref 250–450)
UIBC: 260 ug/dL

## 2020-03-06 MED ORDER — RISPERIDONE 2 MG PO TABS: 2.0000 mg | ORAL_TABLET | Freq: Every day | ORAL | 1 refills | Status: DC

## 2020-03-06 MED ORDER — DULOXETINE HCL 60 MG PO CPEP: 60.0000 mg | ORAL_CAPSULE | Freq: Every day | ORAL | 1 refills | Status: DC

## 2020-03-06 MED ORDER — LAMOTRIGINE 25 & 50 & 100 MG PO KIT: 25.0000 mg | PACK | ORAL | 0 refills | Status: DC

## 2020-03-06 MED ORDER — GABAPENTIN 300 MG PO CAPS: 300.0000 mg | ORAL_CAPSULE | Freq: Three times a day (TID) | ORAL | 1 refills | Status: DC

## 2020-03-06 NOTE — Progress Notes (Signed)
Recreation Therapy Notes  Date: 03/06/2020   Time: 9:30 am   Location: Craft room     Behavioral response: N/A   Intervention Topic: Relaxation  Discussion/Intervention: Patient did not attend group.   Clinical Observations/Feedback:  Patient did not attend group.   Cheynne Virden LRT/CTRS        Mozella Rexrode 03/06/2020 4:23 PM

## 2020-03-06 NOTE — Progress Notes (Signed)
Patient is alert and oriented. She states that she is feeling better this evening and explained that she had been isolating for most of the day today.  She denies SI HI AVH and pain at this encounter. She does endorse depression and anxiety rating both 7/10.  She is med compliant and tolerated her meds without incident. She is safe on the unit with 15 minute safety checks and encouraged to come to staff with any concerns.     Cleo Butler-Nicholson, LPN

## 2020-03-06 NOTE — Progress Notes (Signed)
Pt is alert and oriented to person, place, time and situation. Pt is calm, cooperative, denies suicidal and homicidal ideation, denies hallucinations. Pt reports fatigue and the psychiatrist/MD on unit is aware. Pt reports mild anxiety and depression rating them both 4/10 on a 0-10 scale, 10 being worst. Pt is visible, out for meals, quiet, hypo-verbal, isolates in her room sitting awake quietly in bed. Will continue to monitor pt per Q15 minute face checks and monitor for safety and progress.

## 2020-03-06 NOTE — Plan of Care (Signed)
°  Problem: Coping Skills °Goal: STG - Patient will identify 3 positive coping skills strategies to use post d/c within 5 recreation therapy group sessions °Description: STG - Patient will identify 3 positive coping skills strategies to use post d/c within 5 recreation therapy group sessions °Outcome: Not Progressing °  °

## 2020-03-06 NOTE — [facility] Group Notes (Signed)
[facility] Group Notes:  (Nursing/MHT/Case Management/Adjunct)  Date:  03/06/2020  Time:  9:18 AM  Type of Therapy:  Community Meeting  Participation Level:  Active  Participation Quality:  Appropriate and Attentive  Affect:  Appropriate  Cognitive:  Alert and Appropriate  Insight:  Appropriate  Engagement in Group:  Engaged  Modes of Intervention:  Discussion, Education and Support  Summary of Progress/Problems:  Lynelle Smoke Madoni 03/06/2020, 9:18 AM
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
## 2020-03-06 NOTE — BHH Counselor (Signed)
CSW contacted [facility] regarding pt upcoming discharge. Team lead Maxine Glenn stated that she will see pt within the week. She stated that pt no longer sees provider at [facility] and will need to follow up with someone regarding her medication. No other concerns expressed. Contact ended without incident.   CSW contacted [facility] regarding aftercare appointment. Appointment was scheduled with Rudi Coco on 03/15/20 at 9:00am. CSW was informed that pt would need to show up for appointment 15 minutes prior to scheduled time. No other concerns expressed. Contact ended without incident. Follow up appointment added to chart.  Kirsten Richardson. Algis Greenhouse, MSW, LCSW, LCAS 03/06/2020 2:39 PM

## 2020-03-06 NOTE — B[facility] Group Notes (Signed)
LCSW Group Therapy Note  03/06/2020 2:26 PM  Type of Therapy/Topic:  Group Therapy:  Balance in Life  Participation Level:  Did Not Attend  Description of Group:    This group will address the concept of balance and how it feels and looks when one is unbalanced. Patients will be encouraged to process areas in their lives that are out of balance and identify reasons for remaining unbalanced. Facilitators will guide patients in utilizing problem-solving interventions to address and correct the stressor making their life unbalanced. Understanding and applying boundaries will be explored and addressed for obtaining and maintaining a balanced life. Patients will be encouraged to explore ways to assertively make their unbalanced needs known to significant others in their lives, using other group members and facilitator for support and feedback.  Therapeutic Goals: 1. Patient will identify two or more emotions or situations they have that consume much of in their lives. 2. Patient will identify signs/triggers that life has become out of balance:  3. Patient will identify two ways to set boundaries in order to achieve balance in their lives:  4. Patient will demonstrate ability to communicate their needs through discussion and/or role plays  Summary of Patient Progress: X  Therapeutic Modalities:   Cognitive Behavioral Therapy Solution-Focused Therapy Assertiveness Training  Livio Ledwith MSW, LCSW 03/06/2020 2:26 PM    

## 2020-03-07 NOTE — Progress Notes (Signed)
Discharge Note:   Pt is alert and oriented to person, place, time and situation. Pt is calm, cooperative, denies suicidal and homicidal ideation, denies hallucinations, denies feelings of depression and anxiety. Pt given discharge instructions, which included pt follow up appointments, discharge mediation and medication education, prescriptions were called in to pt's pharmacy by provider. All personal belongings were returned to pt upon discharge. Pt voiced no complaints, no distress noted or reported. Pt discharged at 12:15, left via safe transport to home.
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
## 2020-03-07 NOTE — Care Management Important Message (Signed)
Important Message  Patient Details  Name: Kirsten Richardson MRN: [id number] Date of Birth: 1975/05/30   Medicare Important Message Given:  Yes     [name], LCSW 03/07/2020, 10:13 AM

## 2020-03-07 NOTE — Progress Notes (Signed)
Recreation Therapy Notes  INPATIENT RECREATION TR PLAN  Patient Details Name: Kirsten Richardson MRN: [id number] DOB: 1975-10-09 Today's Date: 03/07/2020  Rec Therapy Plan Is patient appropriate for Therapeutic Recreation?: Yes Treatment times per week: At least 3 Estimated Length of Stay: 5-7 days TR Treatment/Interventions: Group participation (Comment)  Discharge Criteria Pt will be discharged from therapy if:: Discharged Treatment plan/goals/alternatives discussed and agreed upon by:: Patient/family  Discharge Summary Short term goals set: Patient will identify 3 positive coping skills strategies to use post d/c within 5 recreation therapy group sessions Short term goals met: Not met Reason goals not met: Patient did not attend any groups. Therapeutic equipment acquired: N/A Reason patient discharged from therapy: Discharge from hospital Pt/family agrees with progress & goals achieved: Yes Date patient discharged from therapy: 03/07/20   Kirsten Richardson 03/07/2020, 2:44 PM

## 2020-03-07 NOTE — Plan of Care (Signed)
  Problem: Coping Skills Goal: STG - Patient will identify 3 positive coping skills strategies to use post d/c within 5 recreation therapy group sessions Description: STG - Patient will identify 3 positive coping skills strategies to use post d/c within 5 recreation therapy group sessions 03/07/2020 [id number] by Ernest Haber, LRT Outcome: Not Applicable 09/12/7412 [id number] by Ernest Haber, LRT Outcome: Not Met (add Reason)

## 2020-03-07 NOTE — Progress Notes (Signed)
Recreation Therapy Notes   Date: 03/07/2020  Time: 9:30 am   Location: Craft room     Behavioral response: N/A   Intervention Topic: Leisure   Discussion/Intervention: Patient did not attend group.   Clinical Observations/Feedback:  Patient did not attend group.   Diera Wirkkala LRT/CTRS        Chinwe Lope 03/07/2020 11:23 AM

## 2020-03-07 NOTE — BHH Group Notes (Signed)
LCSW Group Therapy Note  03/07/2020 2:09 PM  Type of Therapy/Topic:  Group Therapy:  Emotion Regulation  Participation Level:  Did Not Attend   Description of Group:   The purpose of this group is to assist patients in learning to regulate negative emotions and experience positive emotions. Patients will be guided to discuss ways in which they have been vulnerable to their negative emotions. These vulnerabilities will be juxtaposed with experiences of positive emotions or situations, and patients will be challenged to use positive emotions to combat negative ones. Special emphasis will be placed on coping with negative emotions in conflict situations, and patients will process healthy conflict resolution skills.  Therapeutic Goals: 1. Patient will identify two positive emotions or experiences to reflect on in order to balance out negative emotions 2. Patient will label two or more emotions that they find the most difficult to experience 3. Patient will demonstrate positive conflict resolution skills through discussion and/or role plays  Summary of Patient Progress: X  Therapeutic Modalities:   Cognitive Behavioral Therapy Feelings Identification Dialectical Behavioral Therapy  Kirsten Richardson R. Algis Greenhouse, MSW, LCSW, LCAS 03/07/2020 2:09 PM

## 2020-03-07 NOTE — Progress Notes (Signed)
  [facility] Adult Case Management Discharge Plan :  Will you be returning to the same living situation after discharge:  Yes,  pt plans to return home. At discharge, do you have transportation home?: Yes,  CSW to arrange transportation. Do you have the ability to pay for your medications: Yes,  [employer]  Release of information consent forms completed and in the chart;  Patient's signature needed at discharge.  Patient to Follow up at:  Follow-up Information    [facility] Follow up.   Why: Monica with [facility] reports that she will follow up with you within a week.  Thanks! Contact information: [address] [place] [address] [phone]        [facility] Follow up.   Why: Appointment is scheduled for 03/12/2020 at 10AM with Mr. Raoul Pitch.  Arrive at [facility] by 9:30AM for paperwork.  Thanks! Contact information: [address] [place], [place] [address] Phone: [phone] Fax: [phone]              Next level of care provider has access to [facility] Link:no  Safety Planning and Suicide Prevention discussed: Yes,  SPE completed with pt.  Have you used any form of tobacco in the last 30 days? (Cigarettes, Smokeless Tobacco, Cigars, and/or Pipes): No  Has patient been referred to the Quitline?: Patient refused referral  Patient has been referred for addiction treatment: Pt. refused referral  Glenis Smoker, LCSW 03/07/2020, 10:03 AM

## 2020-03-07 NOTE — BHH Suicide Risk Assessment (Signed)
[facility] Discharge Suicide Risk Assessment   Principal Problem: Bipolar 2 disorder, major depressive episode (HCC) Discharge Diagnoses: Principal Problem:   Bipolar 2 disorder, major depressive episode (HCC) Active Problems:   Hip pain, chronic   Borderline personality disorder (HCC)   DDD (degenerative disc disease), lumbar   DDD (degenerative disc disease), cervical   SUI (stress urinary incontinence, female)   Hypothyroidism due to acquired atrophy of thyroid   Essential hypertension   COPD (chronic obstructive pulmonary disease) (HCC)   Constipation   Opiate abuse, continuous (HCC)   Class 2 obesity due to excess calories without serious comorbidity with body mass index (BMI) of 38.0 to 38.9 in adult   Headache   Total Time spent with patient: 35 minutes- 25 minutes face-to-face contact with patient, 10 minutes documentation, coordination of care, scripts   Musculoskeletal: Strength & Muscle Tone: within normal limits Gait & Station: normal Patient leans: N/A  Psychiatric Specialty Exam: Review of Systems  Constitutional: Negative for appetite change and fever.  HENT: Negative for rhinorrhea and sore throat.   Eyes: Negative for photophobia and visual disturbance.  Respiratory: Negative for cough and shortness of breath.   Cardiovascular: Negative for chest pain and palpitations.  Gastrointestinal: Negative for constipation, diarrhea, nausea and vomiting.  Endocrine: Negative for cold intolerance and heat intolerance.  Genitourinary: Negative for difficulty urinating and dysuria.  Musculoskeletal: Negative for arthralgias and myalgias.  Skin: Negative for rash and wound.  Allergic/Immunologic: Negative for food allergies and immunocompromised state.  Neurological: Negative for dizziness and headaches.  Hematological: Negative for adenopathy. Does not bruise/bleed easily.  Psychiatric/Behavioral: Negative for behavioral problems, hallucinations, sleep disturbance and  suicidal ideas.    Blood pressure 114/67, pulse 80, temperature 97.7 F (36.5 C), temperature source Oral, resp. rate 18, height 4\' 11"  (1.499 m), weight 78.9 kg, SpO2 98 %.Body mass index is 35.14 kg/m.  General Appearance: Well Groomed  ::  Good  Speech:  Clear and Coherent and Normal Rate  Volume:  Normal  Mood:  Euthymic  Affect:  Congruent  Thought Process:  Coherent and Linear  Orientation:  Full (Time, Place, and Person)  Thought Content:  Logical  Suicidal Thoughts:  No  Homicidal Thoughts:  No  Memory:  Immediate;   Fair Recent;   Fair Remote;   Fair  Judgement:  Intact  Insight:  Present  Psychomotor Activity:  Normal  Concentration:  Fair  Recall:  [phone] of Knowledge:Fair  Language: Fair  Akathisia:  Negative  Handed:  Right  AIMS (if indicated):     Assets:  Communication Skills Desire for Improvement Financial Resources/Insurance Housing Resilience Talents/Skills Transportation  Sleep:  Number of Hours: 8.75  Cognition: WNL  ADL's:  Intact   Mental Status Per Nursing Assessment::   On Admission:  Suicidal ideation indicated by patient (Passive SI)  Demographic Factors:  Caucasian  Loss Factors: NA  Historical Factors: Impulsivity  Risk Reduction Factors:   Sense of responsibility to family, Positive social support, Positive therapeutic relationship and Positive coping skills or problem solving skills  Continued Clinical Symptoms:  Bipolar Disorder:   Depressive phase Alcohol/Substance Abuse/Dependencies Previous Psychiatric Diagnoses and Treatments  Cognitive Features That Contribute To Risk:  None    Suicide Risk:  Minimal: No identifiable suicidal ideation.  Patients presenting with no risk factors but with morbid ruminations; may be classified as minimal risk based on the severity of the depressive symptoms   Follow-up Information    Care, [phone] Behavioral Follow up.  Why: Your appointment is scheduled for 03/15/20  at 9:00AM with Rudi Coco. Arrive 15 minutes prior to appointment time. Thanks! Contact information: [address] [place] [address] [phone]               Plan Of Care/Follow-up recommendations:  Activity:  as tolerated Diet:  regular diet  Jesse Sans, MD 03/07/2020, 9:54 AM

## 2020-03-07 NOTE — Progress Notes (Signed)
Patient alert and oriented x 4, affect is blunted thoughts are organized and coherent no distress noted, she denies SI/HI/AVH, she appears less anxious, she isolated to self in her room, forwards information, rated depression a 6/10 on a scale  ( 0 low - high 10). Patient is receptive to staff, complaint with night time medication, 15 minutes safety cheeks maintained will continue to monitor closely.

## 2020-06-11 ENCOUNTER — Emergency Department: Payer: Medicare Other

## 2020-06-11 ENCOUNTER — Encounter: Payer: Self-pay | Admitting: Emergency Medicine

## 2020-06-11 ENCOUNTER — Emergency Department
Admission: EM | Admit: 2020-06-11 | Discharge: 2020-06-12 | Disposition: A | Discharge status: Home / Self Care | Payer: Medicare Other | Attending: Emergency Medicine | Admitting: Emergency Medicine

## 2020-06-11 DIAGNOSIS — I10 Essential (primary) hypertension: Secondary | ICD-10-CM | POA: Diagnosis present

## 2020-06-11 DIAGNOSIS — Z20822 Contact with and (suspected) exposure to covid-19: Secondary | ICD-10-CM | POA: Insufficient documentation

## 2020-06-11 DIAGNOSIS — M545 Low back pain, unspecified: Secondary | ICD-10-CM | POA: Insufficient documentation

## 2020-06-11 DIAGNOSIS — E039 Hypothyroidism, unspecified: Secondary | ICD-10-CM | POA: Diagnosis not present

## 2020-06-11 DIAGNOSIS — R531 Weakness: Secondary | ICD-10-CM | POA: Insufficient documentation

## 2020-06-11 DIAGNOSIS — Z87891 Personal history of nicotine dependence: Secondary | ICD-10-CM | POA: Diagnosis not present

## 2020-06-11 DIAGNOSIS — F329 Major depressive disorder, single episode, unspecified: Secondary | ICD-10-CM | POA: Diagnosis not present

## 2020-06-11 DIAGNOSIS — G8929 Other chronic pain: Secondary | ICD-10-CM | POA: Diagnosis not present

## 2020-06-11 DIAGNOSIS — F3181 Bipolar II disorder: Secondary | ICD-10-CM | POA: Diagnosis present

## 2020-06-11 DIAGNOSIS — R4781 Slurred speech: Secondary | ICD-10-CM | POA: Diagnosis not present

## 2020-06-11 DIAGNOSIS — R001 Bradycardia, unspecified: Secondary | ICD-10-CM | POA: Diagnosis not present

## 2020-06-11 DIAGNOSIS — R6889 Other general symptoms and signs: Secondary | ICD-10-CM

## 2020-06-11 DIAGNOSIS — R45851 Suicidal ideations: Secondary | ICD-10-CM

## 2020-06-11 DIAGNOSIS — Z79899 Other long term (current) drug therapy: Secondary | ICD-10-CM | POA: Insufficient documentation

## 2020-06-11 DIAGNOSIS — E034 Atrophy of thyroid (acquired): Secondary | ICD-10-CM | POA: Diagnosis present

## 2020-06-11 DIAGNOSIS — J449 Chronic obstructive pulmonary disease, unspecified: Secondary | ICD-10-CM | POA: Diagnosis not present

## 2020-06-11 DIAGNOSIS — F603 Borderline personality disorder: Secondary | ICD-10-CM | POA: Diagnosis present

## 2020-06-11 LAB — COMPREHENSIVE METABOLIC PANEL
ALT: 58 U/L — ABNORMAL HIGH (ref 0–44)
AST: 73 U/L — ABNORMAL HIGH (ref 15–41)
Albumin: 3.7 g/dL (ref 3.5–5.0)
Alkaline Phosphatase: 64 U/L (ref 38–126)
Anion gap: 6 (ref 5–15)
BUN: 9 mg/dL (ref 6–20)
CO2: 21 mmol/L — ABNORMAL LOW (ref 22–32)
Calcium: 8.8 mg/dL — ABNORMAL LOW (ref 8.9–10.3)
Chloride: 108 mmol/L (ref 98–111)
Creatinine, Ser: 0.76 mg/dL (ref 0.44–1.00)
GFR, Estimated: >60 mL/min (ref >60)
Glucose, Bld: 98 mg/dL (ref 70–99)
Potassium: 4.8 mmol/L (ref 3.5–5.1)
Sodium: 135 mmol/L (ref 135–145)
Total Bilirubin: 1.2 mg/dL (ref 0.3–1.2)
Total Protein: 6.6 g/dL (ref 6.5–8.1)

## 2020-06-11 LAB — CBC WITH DIFFERENTIAL/PLATELET
Abs Immature Granulocytes: 0.02 10*3/uL (ref 0.00–0.07)
Basophils Absolute: 0.1 10*3/uL (ref 0.0–0.1)
Basophils Relative: 1 %
Eosinophils Absolute: 0.1 10*3/uL (ref 0.0–0.5)
Eosinophils Relative: 2 %
HCT: 40.4 % (ref 36.0–46.0)
Hemoglobin: 13.5 g/dL (ref 12.0–15.0)
Immature Granulocytes: 0 %
Lymphocytes Relative: 20 %
Lymphs Abs: 1.1 10*3/uL (ref 0.7–4.0)
MCH: 31.4 pg (ref 26.0–34.0)
MCHC: 33.4 g/dL (ref 30.0–36.0)
MCV: 94 fL (ref 80.0–100.0)
Monocytes Absolute: 0.2 10*3/uL (ref 0.1–1.0)
Monocytes Relative: 3 %
Neutro Abs: 4.3 10*3/uL (ref 1.7–7.7)
Neutrophils Relative %: 74 %
Platelets: 291 10*3/uL (ref 150–400)
RBC: 4.3 MIL/uL (ref 3.87–5.11)
RDW: 13.8 % (ref 11.5–15.5)
WBC: 5.8 10*3/uL (ref 4.0–10.5)
nRBC: 0 % (ref 0.0–0.2)

## 2020-06-11 LAB — RESP PANEL BY RT-PCR (FLU A&B, COVID) ARPGX2
Influenza A by PCR: NEGATIVE
Influenza B by PCR: NEGATIVE
SARS Coronavirus 2 by RT PCR: NEGATIVE

## 2020-06-11 LAB — ETHANOL: Alcohol, Ethyl (B): <10 mg/dL (ref <10)

## 2020-06-11 LAB — MAGNESIUM: Magnesium: 2 mg/dL (ref 1.7–2.4)

## 2020-06-11 LAB — TSH: TSH: 0.496 u[IU]/mL (ref 0.350–4.500)

## 2020-06-11 LAB — SALICYLATE LEVEL: Salicylate Lvl: <7 mg/dL — ABNORMAL LOW (ref 7.0–30.0)

## 2020-06-11 LAB — ACETAMINOPHEN LEVEL: Acetaminophen (Tylenol), Serum: <10 ug/mL — ABNORMAL LOW (ref 10–30)

## 2020-06-11 MED ORDER — NICOTINE 21 MG/24HR TD PT24
21.0000 mg | MEDICATED_PATCH | Freq: Once | TRANSDERMAL | Status: AC
  Administered 2020-06-11: 21 mg via TRANSDERMAL
  Filled 2020-06-11: qty 1

## 2020-06-11 MED ORDER — MIRABEGRON ER 25 MG PO TB24
25.0000 mg | ORAL_TABLET | Freq: Every day | ORAL | Status: DC
  Administered 2020-06-11: 25 mg via ORAL
  Filled 2020-06-11 (×2): qty 1

## 2020-06-11 MED ORDER — ALBUTEROL SULFATE (2.5 MG/3ML) 0.083% IN NEBU
2.5000 mg | INHALATION_SOLUTION | RESPIRATORY_TRACT | Status: DC | PRN
  Filled 2020-06-11: qty 3

## 2020-06-11 MED ORDER — ACETAMINOPHEN 325 MG PO TABS
650.0000 mg | ORAL_TABLET | Freq: Once | ORAL | Status: AC
  Administered 2020-06-11: 650 mg via ORAL
  Filled 2020-06-11: qty 2

## 2020-06-11 MED ORDER — GABAPENTIN 300 MG PO CAPS
300.0000 mg | ORAL_CAPSULE | Freq: Three times a day (TID) | ORAL | Status: DC
  Administered 2020-06-11 – 2020-06-12 (×4): 300 mg via ORAL
  Filled 2020-06-11 (×5): qty 1

## 2020-06-11 MED ORDER — DULOXETINE HCL 60 MG PO CPEP
60.0000 mg | ORAL_CAPSULE | Freq: Every day | ORAL | Status: DC
  Administered 2020-06-12: 60 mg via ORAL
  Filled 2020-06-11 (×2): qty 1

## 2020-06-11 MED ORDER — LOSARTAN POTASSIUM 50 MG PO TABS
100.0000 mg | ORAL_TABLET | Freq: Every day | ORAL | Status: DC
  Administered 2020-06-12: 100 mg via ORAL
  Filled 2020-06-11 (×2): qty 2

## 2020-06-11 MED ORDER — RISPERIDONE 1 MG PO TABS
2.0000 mg | ORAL_TABLET | Freq: Every day | ORAL | Status: DC
  Administered 2020-06-11: 2 mg via ORAL
  Filled 2020-06-11: qty 2

## 2020-06-11 MED ORDER — LAMOTRIGINE 25 & 50 & 100 MG PO KIT: 25.0000 mg | PACK | ORAL | Status: DC

## 2020-06-11 MED ORDER — FOLIC ACID 1 MG PO TABS
1.0000 mg | ORAL_TABLET | Freq: Every day | ORAL | Status: DC
  Administered 2020-06-12: 1 mg via ORAL
  Filled 2020-06-11 (×2): qty 1

## 2020-06-11 MED ORDER — FUROSEMIDE 40 MG PO TABS
40.0000 mg | ORAL_TABLET | Freq: Every day | ORAL | Status: DC
  Administered 2020-06-12: 40 mg via ORAL
  Filled 2020-06-11 (×2): qty 1

## 2020-06-11 MED ORDER — UMECLIDINIUM BROMIDE 62.5 MCG/INH IN AEPB
1.0000 | INHALATION_SPRAY | Freq: Every day | RESPIRATORY_TRACT | Status: DC
  Filled 2020-06-11: qty 7

## 2020-06-11 MED ORDER — TOPIRAMATE 25 MG PO TABS
150.0000 mg | ORAL_TABLET | Freq: Every day | ORAL | Status: DC
  Administered 2020-06-12: 150 mg via ORAL
  Filled 2020-06-11: qty 6
  Filled 2020-06-11: qty 2

## 2020-06-11 MED ORDER — LAMOTRIGINE 100 MG PO TABS
200.0000 mg | ORAL_TABLET | Freq: Every day | ORAL | Status: DC
  Administered 2020-06-12: 200 mg via ORAL
  Filled 2020-06-11: qty 2

## 2020-06-11 MED ORDER — ALBUTEROL SULFATE HFA 108 (90 BASE) MCG/ACT IN AERS
1.0000 | INHALATION_SPRAY | RESPIRATORY_TRACT | Status: DC | PRN
Start: 2020-06-11 — End: 2020-06-11

## 2020-06-11 MED ORDER — LEVOTHYROXINE SODIUM 75 MCG PO TABS
75.0000 ug | ORAL_TABLET | Freq: Every day | ORAL | Status: DC
  Administered 2020-06-12: 75 ug via ORAL
  Filled 2020-06-11: qty 1

## 2020-06-11 MED ORDER — LUBIPROSTONE 24 MCG PO CAPS
24.0000 ug | ORAL_CAPSULE | Freq: Every day | ORAL | Status: DC
  Administered 2020-06-12: 24 ug via ORAL
  Filled 2020-06-11 (×2): qty 1

## 2020-06-11 MED ORDER — LEVOTHYROXINE SODIUM 50 MCG PO TABS: 75.0000 ug | ORAL_TABLET | Freq: Every day | ORAL | Status: DC

## 2020-06-11 NOTE — ED Notes (Signed)
Pt given dinner tray no other needs at this time  

## 2020-06-11 NOTE — ED Notes (Signed)
Pt. Transferred to BHU from ED to room after screening for contraband. Report to include Situation, Background, Assessment and Recommendations from [facility]. Pt. Oriented to unit including Q15 minute rounds as well as the security cameras for their protection. Patient is alert and oriented, warm and dry in no acute distress. Patient denies SI and HI. Reported AVH. Pt. Encouraged to let me know if needs arise.

## 2020-06-11 NOTE — ED Notes (Addendum)
This RN made aware pt was wandering the hall. Pt directed back into room by staff. Pt assisted back in bed at this time. Bed alarm activated on bed. Pt stating she does not want to stay and wants to go home. Pt refusing for this RN to place pt back on monitors at this time. Pt friend now at bedside. MD made aware.

## 2020-06-11 NOTE — BH Assessment (Signed)
Comprehensive Clinical Assessment (CCA) Note  06/11/2020 Kirsten Richardson [id number]   Shella Maxim, 44 year oldfemale who presents to [facility] ED involuntarily for treatment. Per triage note, Pt arrived from home via [facility] with c/o weakness. EMS was originally called for stroke. Pt noted to have slurred speech per friend. LKW was Thursday. Per EMS pupils were assessed as a 3 on scene, and per EMS noted that pupils became more pinpoint during transport. Equal grip strength noted by EMS. Per EMS, pt admitted to taking "a lot of my suboxin" EMS noted that pt stated she took more than prescribed amount. Per EMS, pt denied any other drug or ETOH use. Pt denied to this RN any suboxin use in the last 2 days or any other drug/ETOH use at this time.   During TTS assessment pt presents alert and oriented x 4, anxious but cooperative, and mood-congruent with affect. The pt does not appear to be responding to internal or external stimuli. Neither is the pt presenting with any delusional thinking. Pt verified the information provided to triage RN.   Pt identifies her main complaint to be that she missed taking her meds for 2 days so she doubled up and now she isn't feeling well. Patient states her therapist whom she cannot remember said that her speech was slow and she wasn't behaving like her "usual self" so she called 911. Patient reports she is not able to recall the names of her psych medication but mentioned she is prescribed Suboxone and has been taking it regularly for 4 months. Pt denies the current use of illicit substances and alcohol. Pt reports [facility] h[facility] and [facility]. Patient states she is seeing a therapist for OPT tx. Pt reports having a medical hx of lower back pain and pain in her feet. Patient reports when she first arrived she was feeling suicidal but now she is not. Pt denies HI/VH Patient reports AH where she can hear whispers, music and her name being called.    Per Dr. Toni Amend pt is  recommended for inpatient psychiatric admission.     Chief Complaint: No chief complaint on file.  Visit Diagnosis: Bipolar 2, major depressive episode    CCA Screening, Triage and Referral (STR)  Patient Reported Information How did you hear about [place]? Self  Referral name: [facility]  Referral phone number: No data recorded  Whom do you see for routine medical problems? Other (Comment)  Practice/Facility Name: No data recorded Practice/Facility Phone Number: No data recorded Name of Contact: No data recorded Contact Number: No data recorded Contact Fax Number: No data recorded Prescriber Name: No data recorded Prescriber Address (if known): No data recorded  What Is the Reason for Your Visit/Call Today? Patient reports she was brought to the ED because she wasn't feeling well. Patient states she missed taking her meds for a couple of days and doubled up.  How Long Has This Been Causing You Problems? <Week  What Do You Feel Would Help You the Most Today? Medication(s) (Assessment only)   Have You Recently Been in Any Inpatient Treatment (Hospital/Detox/Crisis Center/28-Day Program)? No  Name/Location of Program/Hospital:No data recorded How Long Were You There? No data recorded When Were You Discharged? No data recorded  Have You Ever Received Services From [facility] Before? No  Who Do You See at [facility]? No data recorded  Have You Recently Had Any Thoughts About Hurting Yourself? Yes  Are You Planning to Commit Suicide/Harm Yourself At This time? No  Have you Recently Had Thoughts About Hurting Someone [name]? No  Explanation: No data recorded  Have You Used Any Alcohol or Drugs in the Past 24 Hours? No  How Long Ago Did You Use Drugs or Alcohol? 2240  What Did You Use and How Much? Ampthetamines   Do You Currently Have a Therapist/Psychiatrist? Yes  Name of Therapist/Psychiatrist: Patient is unable to remember name.   Have You Been Recently Discharged  From Any Office Practice or Programs? No  Explanation of Discharge From Practice/Program: No data recorded    CCA Screening Triage Referral Assessment Type of Contact: Face-to-Face  Is this Initial or Reassessment? No data recorded Date Telepsych consult ordered in [facility]:  No data recorded Time Telepsych consult ordered in [facility]:  No data recorded  Patient Reported Information Reviewed? Yes  Patient Left Without Being Seen? No data recorded Reason for Not Completing Assessment: No data recorded  Collateral Involvement: None provided   Does Patient Have a Court Appointed Legal Guardian? No data recorded Name and Contact of Legal Guardian: No data recorded If Minor and Not Living with Parent(s), Who has Custody? n/a  Is CPS involved or ever been involved? Never  Is APS involved or ever been involved? Never   Patient Determined To Be At Risk for Harm To Self or Others Based on Review of Patient Reported Information or Presenting Complaint? No  Method: No data recorded Availability of Means: No data recorded Intent: No data recorded Notification Required: No data recorded Additional Information for Danger to Others Potential: No data recorded Additional Comments for Danger to Others Potential: No data recorded Are There Guns or Other Weapons in Your Home? No data recorded Types of Guns/Weapons: No data recorded Are These Weapons Safely Secured?                            No data recorded Who Could Verify You Are Able To Have These Secured: No data recorded Do You Have any Outstanding Charges, Pending Court Dates, Parole/Probation? No data recorded Contacted To Inform of Risk of Harm To Self or Others: No data recorded  Location of Assessment: [facility] ED   Does Patient Present under Involuntary Commitment? Yes  IVC Papers Initial File Date: 06/11/2020   [place] of Residence: [place]   Patient Currently Receiving the Following Services: Medication Management; Individual  Therapy   Determination of Need: Urgent (48 hours)   Options For Referral: Inpatient Hospitalization; ED Visit; Medication Management     CCA Biopsychosocial Intake/Chief Complaint:  Patient reports she isnt feeling well because she doubled up on her medication after missing 2 days.  Current Symptoms/Problems: Slow speech   Patient Reported Schizophrenia/Schizoaffective Diagnosis in Past: No   Strengths: UTA  Preferences: UTA  Abilities: UTA   Type of Services Patient Feels are Needed: UTA   Initial Clinical Notes/Concerns: None   Mental Health Symptoms Depression:  Change in energy/activity; Sleep (too much or little); Difficulty Concentrating; Increase/decrease in appetite   Duration of Depressive symptoms: Less than two weeks   Mania:  None   Anxiety:   Difficulty concentrating; Restlessness; Sleep   Psychosis:  Hallucinations   Duration of Psychotic symptoms: Less than six months   Trauma:  None   Obsessions:  None   Compulsions:  None   Inattention:  None   Hyperactivity/Impulsivity:  N/A   Oppositional/Defiant Behaviors:  None   Emotional Irregularity:  Potentially harmful impulsivity   Other Mood/Personality Symptoms:  No data recorded   Mental Status Exam Appearance and self-care  Stature:  Average   Weight:  Average weight   Clothing:  Disheveled   Grooming:  Neglected   Cosmetic use:  None   Posture/gait:  Normal   Motor activity:  Not Remarkable   Sensorium  Attention:  Distractible   Concentration:  Scattered   Orientation:  X5   Recall/memory:  Normal   Affect and Mood  Affect:  Anxious; Depressed; Flat   Mood:  Anxious; Depressed   Relating  Eye contact:  Normal   Facial expression:  Anxious   Attitude toward examiner:  Cooperative   Thought and Language  Speech flow: Slow; Slurred   Thought content:  Appropriate to Mood and Circumstances   Preoccupation:  None   Hallucinations:  Auditory    Organization:  No data recorded  [employer] of Knowledge:  Fair   Intelligence:  Average   Abstraction:  Functional   Judgement:  Impaired; Poor   Reality Testing:  Adequate   Insight:  Lacking; Poor   Decision Making:  Impulsive   Social Functioning  Social Maturity:  Isolates   Social Judgement:  Heedless   Stress  Stressors:  Other (Comment)   Coping Ability:  Exhausted   Skill Deficits:  Decision making   Supports:  Support needed     Religion:    Leisure/Recreation:    Exercise/Diet: Exercise/Diet Do You Have Any Trouble Sleeping?: Yes Explanation of Sleeping Difficulties: Patient reports she is sleeping too much.   CCA Employment/Education Employment/Work Situation: Employment / Work Situation Employment situation: On disability Why is patient on disability: Unknown How long has patient been on disability: Unknown What is the longest time patient has a held a job?: Unknown  Education: Education Is Patient Currently Attending School?: No   CCA Family/Childhood History Family and Relationship History:    Childhood History:  Childhood History Additional childhood history information: UTA Description of patient's relationship with caregiver when they were a child: UTA How were you disciplined when you got in trouble as a child/adolescent?: UTA  Child/Adolescent Assessment:     CCA Substance Use Alcohol/Drug Use: Alcohol / Drug Use Pain Medications: See MAR Prescriptions: See MAR Over the Counter: See MAR History of alcohol / drug use?: Yes Substance #1 Name of Substance 1: Opiates                       ASAM's:  Six Dimensions of Multidimensional Assessment  Dimension 1:  Acute Intoxication and/or Withdrawal Potential:      Dimension 2:  Biomedical Conditions and Complications:      Dimension 3:  Emotional, Behavioral, or Cognitive Conditions and Complications:     Dimension 4:  Readiness to Change:      Dimension 5:  Relapse, Continued use, or Continued Problem Potential:     Dimension 6:  Recovery/Living Environment:     ASAM Severity Score:    ASAM Recommended Level of Treatment:     Substance use Disorder (SUD) Substance Use Disorder (SUD)  Checklist Symptoms of Substance Use: Evidence of tolerance  Recommendations for Services/Supports/Treatments: Recommendations for Services/Supports/Treatments Recommendations For Services/Supports/Treatments: SAIOP (Substance Abuse Intensive Outpatient Program),Inpatient Hospitalization  DSM5 Diagnoses: Patient Active Problem List   Diagnosis Date Noted  . Opiate abuse, continuous (HCC) 02/28/2020  . Homicidal thoughts   . Bilateral hand pain 11/18/2018  . Depression with suicidal ideation 05/27/2018  . Cocaine abuse (HCC) 05/27/2018  . Suicidal  thoughts 05/26/2018  . Peptic ulcer 08/19/2017  . Class 2 obesity due to excess calories without serious comorbidity with body mass index (BMI) of 38.0 to 38.9 in adult 08/19/2017  . COPD (chronic obstructive pulmonary disease) (HCC) 08/26/2016  . Constipation 08/26/2016  . Bipolar 2 disorder, major depressive episode (HCC) 08/25/2016  . Essential hypertension 08/31/2015  . DDD (degenerative disc disease), lumbar 08/02/2015  . DDD (degenerative disc disease), cervical 08/02/2015  . Hypothyroidism due to acquired atrophy of thyroid 04/04/2015  . Hip pain, chronic 09/27/2014  . Borderline personality disorder (HCC) 09/27/2014  . SUI (stress urinary incontinence, female) 01/02/2014    Patient Centered Plan: Patient is on the following Treatment Plan(s):  Anxiety and Depression   Referrals to Alternative Service(s): Referred to Alternative Service(s):   Place:   Date:   Time:    Referred to Alternative Service(s):   Place:   Date:   Time:    Referred to Alternative Service(s):   Place:   Date:   Time:    Referred to Alternative Service(s):   Place:   Date:   Time:     Kyren Vaux Dierdre Searles,  Counselor, LCAS-A

## 2020-06-11 NOTE — ED Notes (Signed)
Ambulated safely into wheelchair without difficulties and taken to BHU 2 by EDT and Security. 2 belonging bags sent with patient.

## 2020-06-11 NOTE — ED Notes (Addendum)
Pt requesting medication for headache. EDP notified.  

## 2020-06-11 NOTE — BH Assessment (Signed)
Adult MH  Referral information for Psychiatric Hospitalization faxed to:   [facility] ([phone]-or- [phone]),   [facility] ([phone]),   [facility] ([phone] -or- [phone]),   [facility] [phone] -or- [phone]) [phone]   [facility] ([phone]---[phone]---[phone]),   [facility] ([phone] or [phone])   [facility] ([phone] or [phone]),   [facility] ([phone]). 

## 2020-06-11 NOTE — ED Notes (Signed)
Given fresh blanket, denies further needs. Pt states she is going to bed for night. Contracts for safety.
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
## 2020-06-11 NOTE — ED Notes (Signed)
Ambulated in room without difficulties.

## 2020-06-11 NOTE — ED Notes (Signed)
Pt dressed our by this Ambulance person, Charity fundraiser. Belonging placed in bags are as follows:  1 pair of crocs 1 toe ring  1 white shirt 1 white bra 1 cell phone 1 purse 1 pair blue shorts

## 2020-06-11 NOTE — ED Notes (Signed)
[facility]  MOVED  TO  RM  21  PENDING  CONSULT
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
## 2020-06-11 NOTE — ED Notes (Signed)
Pt medications not given at this time. Per pt, took AM meds at home PTA today.

## 2020-06-11 NOTE — ED Triage Notes (Addendum)
Pt arrived from home via [facility] with c/o weakness. EMS was originally called for stroke Pt noted to have slurred speech per friend. LKW was Thursday.  Per EMS pupils were assessed as a 3 on scene, and per EMS noted that pupils became more pinpoint during transport. Equal grip strength noted by EMS.   Per EMS, pt admitted to taking "a lot of my suboxin" EMS noted that pt stated she took more than prescribed amount. Per EMS, pt denied any other drug or ETOH use.   Pt denied to this RN any suboxin use in the last 2 days or any other drug/ETOH use at this time.

## 2020-06-12 DIAGNOSIS — F3181 Bipolar II disorder: Secondary | ICD-10-CM | POA: Diagnosis not present

## 2020-06-12 LAB — URINALYSIS, COMPLETE (UACMP) WITH MICROSCOPIC
Bilirubin Urine: NEGATIVE
Glucose, UA: NEGATIVE mg/dL
Hgb urine dipstick: NEGATIVE
Ketones, ur: NEGATIVE mg/dL
Leukocytes,Ua: NEGATIVE
Nitrite: NEGATIVE
Protein, ur: NEGATIVE mg/dL
Specific Gravity, Urine: 1.003 — ABNORMAL LOW (ref 1.005–1.030)
pH: 6 (ref 5.0–8.0)

## 2020-06-12 LAB — URINE DRUG SCREEN, QUALITATIVE (ARMC ONLY)
Amphetamines, Ur Screen: NOT DETECTED
Barbiturates, Ur Screen: NOT DETECTED
Benzodiazepine, Ur Scrn: NOT DETECTED
Cannabinoid 50 Ng, Ur ~~LOC~~: NOT DETECTED
Cocaine Metabolite,Ur ~~LOC~~: NOT DETECTED
MDMA (Ecstasy)Ur Screen: NOT DETECTED
Methadone Scn, Ur: NOT DETECTED
Opiate, Ur Screen: NOT DETECTED
Phencyclidine (PCP) Ur S: NOT DETECTED
Tricyclic, Ur Screen: POSITIVE — AB

## 2020-06-12 LAB — POC URINE PREG, ED: Preg Test, Ur: NEGATIVE

## 2020-06-12 NOTE — ED Notes (Signed)
Hourly rounding reveals patient in room. No complaints, stable, in no acute distress. Q15 minute rounds and monitoring via [employer] to continue. 

## 2020-06-12 NOTE — ED Notes (Signed)
Pt dressing for discharge.  Pt given bus route information.

## 2020-06-12 NOTE — ED Notes (Signed)
Pt discharged home with bus route information.  All belongings returned to patient.  VS stable.  Discharge instructions reviewed with patient.

## 2020-06-12 NOTE — ED Provider Notes (Deleted)
Emergency Medicine Observation Re-evaluation Note  Kirsten Richardson is a 45 y.o. female, seen on rounds today.  Pt initially presented to the ED for complaints of No chief complaint on file. Currently, the patient is resting in bed, denies any acute concerns.  Physical Exam  BP 113/64 (BP Location: Left Arm)   Pulse 70   Temp 98.2 F (36.8 C) (Oral)   Resp 18   Ht 4\' 11"  (1.499 m)   Wt 78.9 kg   SpO2 97%   BMI 35.13 kg/m  Physical Exam General: no acute distress Cardiac: normal rate Lungs: equal chest rise Psych: calm  ED Course / MDM  EKG:EKG Interpretation  Date/Time:  Monday June 11 2020 12:35:30 EDT Ventricular Rate:  54 PR Interval:  139 QRS Duration: 101 QT Interval:  450 QTC Calculation: 427 R Axis:   91 Text Interpretation: Sinus rhythm Borderline right axis deviation Confirmed by UNCONFIRMED, DOCTOR (04-13-1979), editor [id number], Tammy [phone]) on 06/11/2020 1:40:58 PM   I have reviewed the labs performed to date as well as medications administered while in observation.  Recent changes in the last 24 hours include none.  Plan  Current plan is for inpatient. Patient is under full IVC at this time.   08/11/2020, MD 06/12/20 [phone]

## 2020-06-12 NOTE — ED Provider Notes (Signed)
Patient cleared for discharge by psychiatry.  Discharged in stable condition.   Gilles Chiquito, MD 06/12/20 [phone]
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
## 2020-09-09 ENCOUNTER — Encounter: Payer: Self-pay | Admitting: Emergency Medicine

## 2020-09-09 ENCOUNTER — Emergency Department (EMERGENCY_DEPARTMENT_HOSPITAL)
Admission: EM | Admit: 2020-09-09 | Discharge: 2020-09-10 | Disposition: A | Discharge status: Other Acute Inpatient Hospital | Payer: Medicare Other | Source: Home / Self Care | Attending: Emergency Medicine | Admitting: Emergency Medicine

## 2020-09-09 ENCOUNTER — Other Ambulatory Visit: Payer: Self-pay

## 2020-09-09 DIAGNOSIS — J449 Chronic obstructive pulmonary disease, unspecified: Secondary | ICD-10-CM | POA: Insufficient documentation

## 2020-09-09 DIAGNOSIS — F3181 Bipolar II disorder: Secondary | ICD-10-CM | POA: Diagnosis not present

## 2020-09-09 DIAGNOSIS — F3163 Bipolar disorder, current episode mixed, severe, without psychotic features: Secondary | ICD-10-CM | POA: Diagnosis not present

## 2020-09-09 DIAGNOSIS — Y900 Blood alcohol level of less than 20 mg/100 ml: Secondary | ICD-10-CM | POA: Insufficient documentation

## 2020-09-09 DIAGNOSIS — Z87891 Personal history of nicotine dependence: Secondary | ICD-10-CM | POA: Insufficient documentation

## 2020-09-09 DIAGNOSIS — Z79899 Other long term (current) drug therapy: Secondary | ICD-10-CM | POA: Insufficient documentation

## 2020-09-09 DIAGNOSIS — I10 Essential (primary) hypertension: Secondary | ICD-10-CM | POA: Insufficient documentation

## 2020-09-09 DIAGNOSIS — F22 Delusional disorders: Secondary | ICD-10-CM | POA: Diagnosis not present

## 2020-09-09 DIAGNOSIS — F1110 Opioid abuse, uncomplicated: Secondary | ICD-10-CM | POA: Diagnosis present

## 2020-09-09 DIAGNOSIS — M5136 Other intervertebral disc degeneration, lumbar region: Secondary | ICD-10-CM | POA: Diagnosis present

## 2020-09-09 DIAGNOSIS — E039 Hypothyroidism, unspecified: Secondary | ICD-10-CM | POA: Insufficient documentation

## 2020-09-09 DIAGNOSIS — M51369 Other intervertebral disc degeneration, lumbar region without mention of lumbar back pain or lower extremity pain: Secondary | ICD-10-CM | POA: Diagnosis present

## 2020-09-09 DIAGNOSIS — Z20822 Contact with and (suspected) exposure to covid-19: Secondary | ICD-10-CM | POA: Insufficient documentation

## 2020-09-09 DIAGNOSIS — E034 Atrophy of thyroid (acquired): Secondary | ICD-10-CM | POA: Diagnosis present

## 2020-09-09 DIAGNOSIS — Z046 Encounter for general psychiatric examination, requested by authority: Secondary | ICD-10-CM

## 2020-09-09 LAB — COMPREHENSIVE METABOLIC PANEL
ALT: 19 U/L (ref 0–44)
AST: 26 U/L (ref 15–41)
Albumin: 3.8 g/dL (ref 3.5–5.0)
Alkaline Phosphatase: 78 U/L (ref 38–126)
Anion gap: 9 (ref 5–15)
BUN: 12 mg/dL (ref 6–20)
CO2: 24 mmol/L (ref 22–32)
Calcium: 9.5 mg/dL (ref 8.9–10.3)
Chloride: 105 mmol/L (ref 98–111)
Creatinine, Ser: 0.92 mg/dL (ref 0.44–1.00)
GFR, Estimated: >60 mL/min (ref >60)
Glucose, Bld: 92 mg/dL (ref 70–99)
Potassium: 3.2 mmol/L — ABNORMAL LOW (ref 3.5–5.1)
Sodium: 138 mmol/L (ref 135–145)
Total Bilirubin: 0.7 mg/dL (ref 0.3–1.2)
Total Protein: 6.7 g/dL (ref 6.5–8.1)

## 2020-09-09 LAB — CBC
HCT: 36.3 % (ref 36.0–46.0)
Hemoglobin: 12.7 g/dL (ref 12.0–15.0)
MCH: 33.4 pg (ref 26.0–34.0)
MCHC: 35 g/dL (ref 30.0–36.0)
MCV: 95.5 fL (ref 80.0–100.0)
Platelets: 372 10*3/uL (ref 150–400)
RBC: 3.8 MIL/uL — ABNORMAL LOW (ref 3.87–5.11)
RDW: 13.2 % (ref 11.5–15.5)
WBC: 10.7 10*3/uL — ABNORMAL HIGH (ref 4.0–10.5)
nRBC: 0 % (ref 0.0–0.2)

## 2020-09-09 LAB — RESP PANEL BY RT-PCR (FLU A&B, COVID) ARPGX2
Influenza A by PCR: NEGATIVE
Influenza B by PCR: NEGATIVE
SARS Coronavirus 2 by RT PCR: NEGATIVE

## 2020-09-09 LAB — SALICYLATE LEVEL: Salicylate Lvl: 10.4 mg/dL (ref 7.0–30.0)

## 2020-09-09 LAB — URINE DRUG SCREEN, QUALITATIVE (ARMC ONLY)
Amphetamines, Ur Screen: POSITIVE — AB
Barbiturates, Ur Screen: NOT DETECTED
Benzodiazepine, Ur Scrn: NOT DETECTED
Cannabinoid 50 Ng, Ur ~~LOC~~: NOT DETECTED
Cocaine Metabolite,Ur ~~LOC~~: NOT DETECTED
MDMA (Ecstasy)Ur Screen: NOT DETECTED
Methadone Scn, Ur: NOT DETECTED
Opiate, Ur Screen: NOT DETECTED
Phencyclidine (PCP) Ur S: NOT DETECTED
Tricyclic, Ur Screen: NOT DETECTED

## 2020-09-09 LAB — ETHANOL: Alcohol, Ethyl (B): <10 mg/dL (ref <10)

## 2020-09-09 LAB — ACETAMINOPHEN LEVEL: Acetaminophen (Tylenol), Serum: <10 ug/mL — ABNORMAL LOW (ref 10–30)

## 2020-09-09 MED ORDER — NICOTINE 21 MG/24HR TD PT24
21.0000 mg | MEDICATED_PATCH | Freq: Once | TRANSDERMAL | Status: AC
  Administered 2020-09-09: 21 mg via TRANSDERMAL
  Filled 2020-09-09: qty 1
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
## 2020-09-09 NOTE — ED Notes (Signed)
Hair scrunchy added to pt belongings.

## 2020-09-09 NOTE — ED Triage Notes (Signed)
Pt via [employer] from home. Per pt, states "my sister is jealous of me because she is going hell and I am going heaven." Pt states she was moving into her new apartment and then [employer] and told her she needed to go to the hospital. Pt denies SI/HI. Denies AVH. Pt has hx of substance abuse, BP I, and schizophrenia. Pt is on Suboxone. Denies illegal drug use and ETOH. Pt is calm and cooperative during triage.

## 2020-09-09 NOTE — ED Notes (Addendum)
Pt belongings include:   1 black cami  1 pair of colorful crocks  1 pink shorts  1 brown belt 1 blue underwear  1 black bra 2 rings and 1 toe ring 1 set of keys

## 2020-09-09 NOTE — ED Notes (Signed)
Dietary called to request dinner tray.

## 2020-09-09 NOTE — ED Notes (Signed)
[employer] Officer Medley with [employer] is a family member and cooberate information if needed. [phone]
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
## 2020-09-09 NOTE — ED Notes (Signed)
IVC pending consult   

## 2020-09-10 ENCOUNTER — Encounter: Payer: Self-pay | Admitting: Psychiatry

## 2020-09-10 ENCOUNTER — Inpatient Hospital Stay
Admission: AD | Admit: 2020-09-10 | Discharge: 2020-09-13 | DRG: 885 | Disposition: A | Discharge status: Home / Self Care | Payer: Medicare Other | Source: Intra-hospital | Attending: Behavioral Health | Admitting: Behavioral Health

## 2020-09-10 DIAGNOSIS — F22 Delusional disorders: Secondary | ICD-10-CM | POA: Diagnosis present

## 2020-09-10 DIAGNOSIS — Z818 Family history of other mental and behavioral disorders: Secondary | ICD-10-CM | POA: Diagnosis not present

## 2020-09-10 DIAGNOSIS — K5900 Constipation, unspecified: Secondary | ICD-10-CM | POA: Diagnosis present

## 2020-09-10 DIAGNOSIS — E034 Atrophy of thyroid (acquired): Secondary | ICD-10-CM | POA: Diagnosis present

## 2020-09-10 DIAGNOSIS — F1110 Opioid abuse, uncomplicated: Secondary | ICD-10-CM | POA: Diagnosis present

## 2020-09-10 DIAGNOSIS — M797 Fibromyalgia: Secondary | ICD-10-CM | POA: Diagnosis present

## 2020-09-10 DIAGNOSIS — F603 Borderline personality disorder: Secondary | ICD-10-CM | POA: Diagnosis present

## 2020-09-10 DIAGNOSIS — N393 Stress incontinence (female) (male): Secondary | ICD-10-CM | POA: Diagnosis present

## 2020-09-10 DIAGNOSIS — F1120 Opioid dependence, uncomplicated: Secondary | ICD-10-CM | POA: Diagnosis present

## 2020-09-10 DIAGNOSIS — Z20822 Contact with and (suspected) exposure to covid-19: Secondary | ICD-10-CM | POA: Diagnosis present

## 2020-09-10 DIAGNOSIS — F3181 Bipolar II disorder: Secondary | ICD-10-CM | POA: Diagnosis not present

## 2020-09-10 DIAGNOSIS — K219 Gastro-esophageal reflux disease without esophagitis: Secondary | ICD-10-CM | POA: Diagnosis present

## 2020-09-10 DIAGNOSIS — R45851 Suicidal ideations: Secondary | ICD-10-CM | POA: Diagnosis present

## 2020-09-10 DIAGNOSIS — F3163 Bipolar disorder, current episode mixed, severe, without psychotic features: Secondary | ICD-10-CM | POA: Diagnosis present

## 2020-09-10 DIAGNOSIS — I10 Essential (primary) hypertension: Secondary | ICD-10-CM | POA: Diagnosis present

## 2020-09-10 DIAGNOSIS — J449 Chronic obstructive pulmonary disease, unspecified: Secondary | ICD-10-CM | POA: Diagnosis present

## 2020-09-10 LAB — HEMOGLOBIN A1C
Hgb A1c MFr Bld: 4.9 % (ref 4.8–5.6)
Mean Plasma Glucose: 93.93 mg/dL

## 2020-09-10 LAB — LIPID PANEL
Cholesterol: 213 mg/dL — ABNORMAL HIGH (ref 0–200)
HDL: 43 mg/dL (ref >40)
LDL Cholesterol: 136 mg/dL — ABNORMAL HIGH (ref 0–99)
Total CHOL/HDL Ratio: 5 RATIO
Triglycerides: 171 mg/dL — ABNORMAL HIGH (ref <150)
VLDL: 34 mg/dL (ref 0–40)

## 2020-09-10 LAB — TSH: TSH: 1.598 u[IU]/mL (ref 0.350–4.500)

## 2020-09-10 MED ORDER — FUROSEMIDE 40 MG PO TABS
40.0000 mg | ORAL_TABLET | Freq: Every day | ORAL | Status: DC
  Administered 2020-09-11 – 2020-09-13 (×3): 40 mg via ORAL
  Filled 2020-09-10 (×3): qty 1

## 2020-09-10 MED ORDER — GABAPENTIN 300 MG PO CAPS
300.0000 mg | ORAL_CAPSULE | Freq: Four times a day (QID) | ORAL | Status: DC
  Administered 2020-09-10 (×3): 300 mg via ORAL
  Filled 2020-09-10 (×3): qty 1

## 2020-09-10 MED ORDER — PANTOPRAZOLE SODIUM 40 MG PO TBEC
40.0000 mg | DELAYED_RELEASE_TABLET | Freq: Every day | ORAL | Status: DC
  Administered 2020-09-10: 40 mg via ORAL
  Filled 2020-09-10: qty 1

## 2020-09-10 MED ORDER — PANTOPRAZOLE SODIUM 40 MG PO TBEC
40.0000 mg | DELAYED_RELEASE_TABLET | Freq: Every day | ORAL | Status: DC
  Administered 2020-09-11 – 2020-09-13 (×3): 40 mg via ORAL
  Filled 2020-09-10 (×3): qty 1

## 2020-09-10 MED ORDER — LOSARTAN POTASSIUM 50 MG PO TABS: 100.0000 mg | ORAL_TABLET | Freq: Every day | ORAL | Status: DC

## 2020-09-10 MED ORDER — ACETAMINOPHEN 500 MG PO TABS
1000.0000 mg | ORAL_TABLET | Freq: Once | ORAL | Status: AC
  Administered 2020-09-10: 1000 mg via ORAL
  Filled 2020-09-10: qty 2

## 2020-09-10 MED ORDER — ALBUTEROL SULFATE HFA 108 (90 BASE) MCG/ACT IN AERS
2.0000 | INHALATION_SPRAY | Freq: Four times a day (QID) | RESPIRATORY_TRACT | Status: DC | PRN
Start: 2020-09-10 — End: 2020-09-10
  Filled 2020-09-10: qty 6.7

## 2020-09-10 MED ORDER — FAMOTIDINE 20 MG PO TABS
20.0000 mg | ORAL_TABLET | Freq: Two times a day (BID) | ORAL | Status: DC
  Administered 2020-09-11 – 2020-09-13 (×5): 20 mg via ORAL
  Filled 2020-09-10 (×5): qty 1

## 2020-09-10 MED ORDER — MIRABEGRON ER 25 MG PO TB24
25.0000 mg | ORAL_TABLET | Freq: Every day | ORAL | Status: DC
  Administered 2020-09-10: 25 mg via ORAL
  Filled 2020-09-10: qty 1

## 2020-09-10 MED ORDER — LEVOTHYROXINE SODIUM 50 MCG PO TABS
75.0000 ug | ORAL_TABLET | Freq: Every day | ORAL | Status: DC
  Administered 2020-09-11 – 2020-09-13 (×3): 75 ug via ORAL
  Filled 2020-09-10 (×3): qty 2

## 2020-09-10 MED ORDER — FUROSEMIDE 40 MG PO TABS
40.0000 mg | ORAL_TABLET | Freq: Every day | ORAL | Status: DC
  Administered 2020-09-10: 40 mg via ORAL
  Filled 2020-09-10 (×2): qty 1

## 2020-09-10 MED ORDER — MIRABEGRON ER 25 MG PO TB24
25.0000 mg | ORAL_TABLET | Freq: Every day | ORAL | Status: DC
  Administered 2020-09-11 – 2020-09-12 (×2): 25 mg via ORAL
  Filled 2020-09-10 (×3): qty 1

## 2020-09-10 MED ORDER — HYDROXYZINE HCL 50 MG PO TABS
50.0000 mg | ORAL_TABLET | Freq: Three times a day (TID) | ORAL | Status: DC | PRN
  Administered 2020-09-11: 50 mg via ORAL
  Filled 2020-09-10: qty 1

## 2020-09-10 MED ORDER — GABAPENTIN 300 MG PO CAPS
300.0000 mg | ORAL_CAPSULE | Freq: Four times a day (QID) | ORAL | Status: DC
  Administered 2020-09-11 – 2020-09-13 (×9): 300 mg via ORAL
  Filled 2020-09-10 (×10): qty 1

## 2020-09-10 MED ORDER — ALUM & MAG HYDROXIDE-SIMETH 200-200-20 MG/5ML PO SUSP: 30.0000 mL | ORAL | Status: DC | PRN

## 2020-09-10 MED ORDER — NICOTINE 21 MG/24HR TD PT24: 21.0000 mg | MEDICATED_PATCH | Freq: Once | TRANSDERMAL | Status: DC

## 2020-09-10 MED ORDER — FAMOTIDINE 20 MG PO TABS
20.0000 mg | ORAL_TABLET | Freq: Two times a day (BID) | ORAL | Status: DC
  Administered 2020-09-10 (×2): 20 mg via ORAL
  Filled 2020-09-10: qty 1

## 2020-09-10 MED ORDER — BUPRENORPHINE HCL-NALOXONE HCL 8-2 MG SL SUBL
2.0000 | SUBLINGUAL_TABLET | Freq: Every day | SUBLINGUAL | Status: DC
  Administered 2020-09-11: 1 via SUBLINGUAL
  Filled 2020-09-10: qty 2

## 2020-09-10 MED ORDER — LISINOPRIL 5 MG PO TABS: 5.0000 mg | ORAL_TABLET | Freq: Every day | ORAL | Status: DC

## 2020-09-10 MED ORDER — LEVOTHYROXINE SODIUM 75 MCG PO TABS: 75.0000 ug | ORAL_TABLET | Freq: Every day | ORAL | Status: DC

## 2020-09-10 MED ORDER — MAGNESIUM HYDROXIDE 400 MG/5ML PO SUSP: 30.0000 mL | Freq: Every day | ORAL | Status: DC | PRN

## 2020-09-10 MED ORDER — ACETAMINOPHEN 325 MG PO TABS: 650.0000 mg | ORAL_TABLET | Freq: Four times a day (QID) | ORAL | Status: DC | PRN

## 2020-09-10 MED ORDER — LUBIPROSTONE 24 MCG PO CAPS
24.0000 ug | ORAL_CAPSULE | Freq: Two times a day (BID) | ORAL | Status: DC
  Administered 2020-09-11 – 2020-09-13 (×5): 24 ug via ORAL
  Filled 2020-09-10 (×6): qty 1

## 2020-09-10 MED ORDER — LUBIPROSTONE 24 MCG PO CAPS
24.0000 ug | ORAL_CAPSULE | Freq: Two times a day (BID) | ORAL | Status: DC
  Filled 2020-09-10 (×2): qty 1

## 2020-09-10 MED ORDER — ALBUTEROL SULFATE HFA 108 (90 BASE) MCG/ACT IN AERS
2.0000 | INHALATION_SPRAY | Freq: Four times a day (QID) | RESPIRATORY_TRACT | Status: DC | PRN
  Filled 2020-09-10: qty 6.7

## 2020-09-10 MED ORDER — NAPROXEN 500 MG PO TABS
500.0000 mg | ORAL_TABLET | Freq: Once | ORAL | Status: AC
  Administered 2020-09-10: 500 mg via ORAL
  Filled 2020-09-10 (×2): qty 1

## 2020-09-10 MED ORDER — BUPRENORPHINE HCL-NALOXONE HCL 8-2 MG SL SUBL
2.0000 | SUBLINGUAL_TABLET | Freq: Every day | SUBLINGUAL | Status: DC
  Administered 2020-09-10: 2 via SUBLINGUAL
  Filled 2020-09-10: qty 2

## 2020-09-10 MED ORDER — NICOTINE 21 MG/24HR TD PT24
21.0000 mg | MEDICATED_PATCH | TRANSDERMAL | Status: DC
  Administered 2020-09-10 – 2020-09-11 (×2): 21 mg via TRANSDERMAL
  Filled 2020-09-10: qty 1
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
## 2020-09-10 NOTE — ED Notes (Signed)
IVC/pending psych consult 

## 2020-09-10 NOTE — ED Notes (Signed)
Pt given denture gum and now states that she can eat the dinner that was previously provided and that she does not need the peanut butter sandwich.

## 2020-09-10 NOTE — ED Notes (Signed)
Unlocked bathroom door to allow patient to use restroom. Pt exited and bathroom door locked behind patient by staff.  

## 2020-09-10 NOTE — ED Notes (Signed)
PT  MOVED  TO  BHU  UNDER  IVC  PENDING  PLACEMENT

## 2020-09-10 NOTE — ED Notes (Signed)
This tech provided pt with a dinner tray. Pt requested for denture adhesive, it was provided. No other needs found at this moment.

## 2020-09-10 NOTE — BH Assessment (Addendum)
Patient is to be admitted to [facility] BMU tonight 09/10/20 after 7:30pm by Dr. Toni Amend.  Attending Physician will be Dr. Neale Burly.   Patient has not been assigned a room, by [facility] Charge Nurse [place].     ER staff is aware of the admission: Misty Stanley, ER Secretary   Dr. Sidney Ace, ER MD  Connye Burkitt, Patient's Nurse  Sue Lush, Patient Access.

## 2020-09-10 NOTE — ED Notes (Signed)
Pt requested soft food due to not having denture cream. Dietary called
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
## 2020-09-10 NOTE — ED Provider Notes (Signed)
Emergency Medicine Observation Re-evaluation Note  Kirsten Richardson is a 45 y.o. female, seen on rounds today.  Pt initially presented to the ED for complaints of No chief complaint on file.  Currently, the patient is resting   Physical Exam  Blood pressure (!) 176/86, pulse 99, temperature 98.8 F (37.1 C), temperature source Oral, resp. rate 20, height 4\' 11"  (1.499 m), weight 72.6 kg, SpO2 100 %.  Physical Exam General: No apparent distress Psych: resting   ED Course / MDM     I have reviewed the labs performed to date as well as medications administered while in observation.  Recent changes in the last 24 hours include none   Plan   Current plan is to continue to wait for psych plan. No psych overnight so pending todays assessment   Patient is under full IVC at this time.   , MD 09/10/20 [phone]

## 2020-09-10 NOTE — ED Notes (Signed)
Pt given meal tray.

## 2020-09-10 NOTE — ED Provider Notes (Signed)
Patient seen and evaluated by Dr. Toni Amend, will admit to inpatient psych.  The patient has been placed in psychiatric observation due to the need to provide a safe environment for the patient while obtaining psychiatric consultation and evaluation, as well as ongoing medical and medication management to treat the patient's condition.  The patient has been placed under full IVC at this time.    Georga Hacking, MD 09/10/20 1101

## 2020-09-10 NOTE — BH Assessment (Signed)
Patient is under review with [facility].  

## 2020-09-10 NOTE — ED Notes (Signed)
Staff asked if she wanted night time snack tray.  Pt did not wake to respond.

## 2020-09-10 NOTE — BH Assessment (Signed)
Pt is accepted to BMU room 310.

## 2020-09-11 ENCOUNTER — Encounter: Payer: Self-pay | Admitting: Psychiatry

## 2020-09-11 ENCOUNTER — Other Ambulatory Visit: Payer: Self-pay

## 2020-09-11 DIAGNOSIS — F3163 Bipolar disorder, current episode mixed, severe, without psychotic features: Secondary | ICD-10-CM | POA: Diagnosis not present

## 2020-09-11 LAB — URINALYSIS, ROUTINE W REFLEX MICROSCOPIC
Bilirubin Urine: NEGATIVE
Glucose, UA: NEGATIVE mg/dL
Hgb urine dipstick: NEGATIVE
Ketones, ur: NEGATIVE mg/dL
Leukocytes,Ua: NEGATIVE
Nitrite: NEGATIVE
Protein, ur: NEGATIVE mg/dL
Specific Gravity, Urine: <1.005 — ABNORMAL LOW (ref 1.005–1.030)
pH: 5.5 (ref 5.0–8.0)

## 2020-09-11 LAB — LIPID PANEL
Cholesterol: 240 mg/dL — ABNORMAL HIGH (ref 0–200)
HDL: 38 mg/dL — ABNORMAL LOW (ref >40)
LDL Cholesterol: 154 mg/dL — ABNORMAL HIGH (ref 0–99)
Total CHOL/HDL Ratio: 6.3 RATIO
Triglycerides: 238 mg/dL — ABNORMAL HIGH (ref <150)
VLDL: 48 mg/dL — ABNORMAL HIGH (ref 0–40)

## 2020-09-11 LAB — HEMOGLOBIN A1C
Hgb A1c MFr Bld: 4.8 % (ref 4.8–5.6)
Mean Plasma Glucose: 91.06 mg/dL

## 2020-09-11 LAB — TSH: TSH: 0.654 u[IU]/mL (ref 0.350–4.500)

## 2020-09-11 MED ORDER — CARIPRAZINE HCL 3 MG PO CAPS
3.0000 mg | ORAL_CAPSULE | Freq: Every day | ORAL | Status: DC
  Administered 2020-09-11 – 2020-09-13 (×3): 3 mg via ORAL
  Filled 2020-09-11 (×3): qty 1

## 2020-09-11 MED ORDER — BUPRENORPHINE HCL-NALOXONE HCL 8-2 MG SL SUBL: 1.0000 | SUBLINGUAL_TABLET | Freq: Two times a day (BID) | SUBLINGUAL | Status: DC

## 2020-09-11 MED ORDER — DULOXETINE HCL 30 MG PO CPEP
60.0000 mg | ORAL_CAPSULE | Freq: Every day | ORAL | Status: DC
  Administered 2020-09-11 – 2020-09-13 (×3): 60 mg via ORAL
  Filled 2020-09-11 (×3): qty 2

## 2020-09-11 MED ORDER — CLOTRIMAZOLE 1 % VA CREA
1.0000 | TOPICAL_CREAM | Freq: Every day | VAGINAL | Status: DC
  Administered 2020-09-11 – 2020-09-12 (×2): 1 via VAGINAL
  Filled 2020-09-11: qty 45

## 2020-09-11 MED ORDER — BENZOCAINE 10 % MT GEL
Freq: Three times a day (TID) | OROMUCOSAL | Status: DC | PRN
  Administered 2020-09-11: 1 via OROMUCOSAL
  Filled 2020-09-11: qty 9

## 2020-09-11 MED ORDER — HYDRALAZINE HCL 10 MG PO TABS
10.0000 mg | ORAL_TABLET | Freq: Four times a day (QID) | ORAL | Status: DC | PRN
  Filled 2020-09-11 (×3): qty 1

## 2020-09-11 MED ORDER — MELOXICAM 7.5 MG PO TABS
15.0000 mg | ORAL_TABLET | Freq: Every day | ORAL | Status: DC
  Administered 2020-09-11 – 2020-09-13 (×3): 15 mg via ORAL
  Filled 2020-09-11 (×3): qty 2

## 2020-09-11 MED ORDER — LOSARTAN POTASSIUM 50 MG PO TABS
100.0000 mg | ORAL_TABLET | Freq: Every day | ORAL | Status: DC
  Administered 2020-09-11 – 2020-09-12 (×2): 100 mg via ORAL
  Filled 2020-09-11 (×3): qty 2

## 2020-09-11 MED ORDER — LAMOTRIGINE 100 MG PO TABS
300.0000 mg | ORAL_TABLET | Freq: Every day | ORAL | Status: DC
  Administered 2020-09-11 – 2020-09-13 (×3): 300 mg via ORAL
  Filled 2020-09-11 (×3): qty 3

## 2020-09-11 MED ORDER — ATORVASTATIN CALCIUM 20 MG PO TABS
20.0000 mg | ORAL_TABLET | Freq: Every day | ORAL | Status: DC
  Administered 2020-09-11 – 2020-09-12 (×2): 20 mg via ORAL
  Filled 2020-09-11 (×2): qty 1
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
## 2020-09-11 NOTE — Group Note (Signed)
[facility] LCSW Group Therapy Note   Group Date: 09/11/2020 Start Time: 1300 End Time: 1400  Type of Therapy/Topic:  Group Therapy:  Feelings about Diagnosis  Participation Level:  Did Not Attend   Mood: n/a   Description of Group:    This group will allow patients to explore their thoughts and feelings about diagnoses they have received. Patients will be guided to explore their level of understanding and acceptance of these diagnoses. Facilitator will encourage patients to process their thoughts and feelings about the reactions of others to their diagnosis, and will guide patients in identifying ways to discuss their diagnosis with significant others in their lives. This group will be process-oriented, with patients participating in exploration of their own experiences as well as giving and receiving support and challenge from other group members.   Therapeutic Goals: 1. Patient will demonstrate understanding of diagnosis as evidence by identifying two or more symptoms of the disorder:  2. Patient will be able to express two feelings regarding the diagnosis 3. Patient will demonstrate ability to communicate their needs through discussion and/or role plays  Summary of Patient Progress: Patient did not attend group despite encouraged participation.    Therapeutic Modalities:   Cognitive Behavioral Therapy Brief Therapy Feelings Identification    Jiyaan Steinhauser W Enyah Moman, LCSWA 
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
## 2020-09-11 NOTE — BHH Suicide Risk Assessment (Signed)
[facility] Admission Suicide Risk Assessment   Nursing information obtained from:  Patient Demographic factors:  Adolescent or young adult, Caucasian, Low socioeconomic status, Unemployed Current Mental Status:  NA Loss Factors:  Decline in physical health, Financial problems / change in socioeconomic status Historical Factors:  Impulsivity Risk Reduction Factors:  Positive social support, Religious beliefs about death  Total Time spent with patient: 1 hour Principal Problem: Bipolar 1 disorder, mixed, severe (HCC) Diagnosis:  Principal Problem:   Bipolar 1 disorder, mixed, severe (HCC) Active Problems:   Borderline personality disorder (HCC)   SUI (stress urinary incontinence, female)   Hypothyroidism due to acquired atrophy of thyroid   Essential hypertension   Constipation   Opiate abuse, continuous (HCC)  Subjective Data: 45 year old female presenting under IVC for paranoia, delusions, and erratic behaviors. No acute events overnight, medication compliant, ADLs impaired. Upon entering room, patient says she is going to refuse to speak to anyone because she is not crazy, and no one believes her. However, she then goes on to explain circumstances leading to admission without prompting. Her speech is pressured, and thought process tangential. Essentially, it sounds like she believes a woman is coming into her house, taking her TV, and doing other odd things. She states she has taken video recording of this, but this woman comes into the home and deletes her footage. She notes that her own family does not believe her. She wants to move to a new location so this woman can't found her. She claims to be medication compliant, but notes there was a recent change from Risperdal to Vraylar that she felt led to a manic episode. She describes a 7-day period where she only slept 9 hours over the 7 days followed by a "crash" where she slept all day. In the course of this conversation and verification of current  prescriptions, it appears she often forgets medications and has missed some doses. She states she was on Cymbalta 60, Lamictal 300, and Vraylar 1.5 mg. Doses verified with [facility] on [address]. Patient agreeable to increasing Vraylar to assist with mood stability. Patient endorses intermittent thoughts of being better off dead though denies active plan or intent. Denies HI/AH/VH.   Continued Clinical Symptoms:  Alcohol Use Disorder Identification Test Final Score (AUDIT): 0 The "Alcohol Use Disorders Identification Test", Guidelines for Use in Primary Care, Second Edition.  World Science writer [facility]). Score between 0-7:  no or low risk or alcohol related problems. Score between 8-15:  moderate risk of alcohol related problems. Score between 16-19:  high risk of alcohol related problems. Score 20 or above:  warrants further diagnostic evaluation for alcohol dependence and treatment.   CLINICAL FACTORS:   Severe Anxiety and/or Agitation Bipolar Disorder:   Mixed State Alcohol/Substance Abuse/Dependencies Currently Psychotic Previous Psychiatric Diagnoses and Treatments Medical Diagnoses and Treatments/Surgeries   Musculoskeletal: Strength & Muscle Tone: within normal limits Gait & Station: normal Patient leans: N/A  Psychiatric Specialty Exam:  Presentation  General Appearance: Disheveled  Eye Contact:Minimal  Speech:Pressured  Speech Volume:Increased  Handedness:Right   Mood and Affect  Mood:Irritable  Affect:Congruent   Thought Process  Thought Processes:Disorganized  Descriptions of Associations:Intact  Orientation:Full (Time, Place and Person)  Thought Content:Paranoid Ideation; Delusions; Perseveration  History of Schizophrenia/Schizoaffective disorder:Yes  Duration of Psychotic Symptoms:Less than six months  Hallucinations:Hallucinations: Auditory  Ideas of Reference:Paranoia; Delusions  Suicidal Thoughts:Suicidal Thoughts:  No  Homicidal Thoughts:Homicidal Thoughts: No   Sensorium  Memory:Remote Fair; Immediate Poor; Recent Poor  Judgment:Impaired  Insight:Lacking  Executive Functions  Concentration:Poor  Attention Span:Poor  Recall:Poor  [employer] of Knowledge:Fair  Language:Fair   Psychomotor Activity  Psychomotor Activity:Psychomotor Activity: Decreased   Assets  Assets:Communication Skills; Desire for Improvement; Financial Resources/Insurance; Housing; Resilience   Sleep  Sleep:Sleep: Poor Number of Hours of Sleep: 5.25    Physical Exam: Physical Exam ROS Blood pressure (!) 162/109, pulse 79, temperature 98.5 F (36.9 C), temperature source Oral, resp. rate 18, height 4\' 11"  (1.499 m), weight 71.7 kg, last menstrual period 09/09/2020, SpO2 100 %. Body mass index is 31.91 kg/m.   COGNITIVE FEATURES THAT CONTRIBUTE TO RISK:  Loss of executive function    SUICIDE RISK:   Mild:  Suicidal ideation of limited frequency, intensity, duration, and specificity.  There are no identifiable plans, no associated intent, mild dysphoria and related symptoms, good self-control (both objective and subjective assessment), few other risk factors, and identifiable protective factors, including available and accessible social support.  PLAN OF CARE: Continue inpatient admission, see H&P for details.   I certify that inpatient services furnished can reasonably be expected to improve the patient's condition.   11/09/2020, MD 09/11/2020, 12:32 PM

## 2020-09-11 NOTE — Progress Notes (Signed)
Recreation Therapy Notes   Date: 09/11/2020  Time: 9:45am   Location: Craft room  Behavioral response: N/A   Intervention Topic: Problem-Solving   Discussion/Intervention: Patient did not attend group.   Clinical Observations/Feedback:  Patient did not attend group.   Phiona Ramnauth LRT/CTRS        Saylor Murry 09/11/2020 10:40 AM

## 2020-09-11 NOTE — Plan of Care (Addendum)
Patient alert x 4 denies SI/HI/AVH, she is compliant with medication  regimen, no physical distress noted, she appears sad, sullen and pensive, she was admitted voluntarily for Suicidal ideation, Substance Abuse and Depression. Patient's skin was checked in presence of Heather MHT her skin was warm to touch, in addition she was checked for contraband none found. Patient was offered support and oriented to the unit. 15 minutes safety checks maintained will continue to monitor.

## 2020-09-11 NOTE — BHH Counselor (Signed)
CSW attempted to meet with pt to complete PSA. However, pt was sleeping soundly, in no acute distress. CSW will attempt PSA at a later time/date.   Vilma Meckel. Algis Greenhouse, MSW, LCSW, LCAS 09/11/2020 4:15 PM

## 2020-09-12 DIAGNOSIS — F3163 Bipolar disorder, current episode mixed, severe, without psychotic features: Secondary | ICD-10-CM | POA: Diagnosis not present

## 2020-09-12 LAB — URINE CULTURE: Culture: NO GROWTH

## 2020-09-12 MED ORDER — POTASSIUM CHLORIDE CRYS ER 20 MEQ PO TBCR
40.0000 meq | EXTENDED_RELEASE_TABLET | Freq: Once | ORAL | Status: AC
  Administered 2020-09-12: 40 meq via ORAL
  Filled 2020-09-12: qty 2

## 2020-09-12 NOTE — BH IP Treatment Plan (Signed)
Interdisciplinary Treatment and Diagnostic Plan Update  09/12/2020 Time of Session: 0900 Kirsten Richardson MRN: [id number]  Principal Diagnosis: Bipolar 1 disorder, mixed, severe ([place])  Secondary Diagnoses: Principal Problem:   Bipolar 1 disorder, mixed, severe ([place]) Active Problems:   Borderline personality disorder ([place])   SUI (stress urinary incontinence, female)   Hypothyroidism due to acquired atrophy of thyroid   Essential hypertension   Constipation   Opiate abuse, continuous ([place])   Current Medications:  Current Facility-Administered Medications  Medication Dose Route Frequency Provider Last Rate Last Admin   acetaminophen (TYLENOL) tablet 650 mg  650 mg Oral Q6H PRN Clapacs, John T, MD       albuterol (VENTOLIN HFA) 108 (90 Base) MCG/ACT inhaler 2 puff  2 puff Inhalation Q6H PRN Clapacs, John T, MD       alum & mag hydroxide-simeth (MAALOX/MYLANTA) 200-200-20 MG/5ML suspension 30 mL  30 mL Oral Q4H PRN Clapacs, John T, MD       atorvastatin (LIPITOR) tablet 20 mg  20 mg Oral QHS Salley Scarlet, MD   20 mg at 09/11/20 2142   benzocaine (ORAJEL) 10 % mucosal gel   Mouth/Throat TID PRN Salley Scarlet, MD   1 application at 00/17/49 1651   buprenorphine-naloxone (SUBOXONE) 8-2 mg per SL tablet 1 tablet  1 tablet Sublingual BID Salley Scarlet, MD       cariprazine (VRAYLAR) capsule 3 mg  3 mg Oral Daily Salley Scarlet, MD   3 mg at 09/12/20 4496   clotrimazole (GYNE-LOTRIMIN) vaginal cream 1 Applicatorful  1 Applicatorful Vaginal QHS Salley Scarlet, MD   1 Applicatorful at 75/91/63 2143   DULoxetine (CYMBALTA) DR capsule 60 mg  60 mg Oral Daily Salley Scarlet, MD   60 mg at 09/12/20 0739   famotidine (PEPCID) tablet 20 mg  20 mg Oral BID Clapacs, Madie Reno, MD   20 mg at 09/12/20 0739   furosemide (LASIX) tablet 40 mg  40 mg Oral Daily Clapacs, Madie Reno, MD   40 mg at 09/12/20 0739   gabapentin (NEURONTIN) capsule 300 mg  300 mg Oral QID Clapacs, John T, MD   300 mg at 09/12/20  8466   hydrALAZINE (APRESOLINE) tablet 10 mg  10 mg Oral Q6H PRN Salley Scarlet, MD       hydrOXYzine (ATARAX/VISTARIL) tablet 50 mg  50 mg Oral TID PRN Clapacs, Madie Reno, MD   50 mg at 09/11/20 2142   lamoTRIgine (LAMICTAL) tablet 300 mg  300 mg Oral Daily Salley Scarlet, MD   300 mg at 09/12/20 0739   levothyroxine (SYNTHROID) tablet 75 mcg  75 mcg Oral Q0600 Clapacs, Madie Reno, MD   75 mcg at 09/12/20 5993   losartan (COZAAR) tablet 100 mg  100 mg Oral Daily Salley Scarlet, MD   100 mg at 09/12/20 5701   lubiprostone (AMITIZA) capsule 24 mcg  24 mcg Oral BID WC Clapacs, Madie Reno, MD   24 mcg at 09/12/20 7793   magnesium hydroxide (MILK OF MAGNESIA) suspension 30 mL  30 mL Oral Daily PRN Clapacs, Madie Reno, MD       meloxicam (MOBIC) tablet 15 mg  15 mg Oral Daily Salley Scarlet, MD   15 mg at 09/12/20 0738   mirabegron ER (MYRBETRIQ) tablet 25 mg  25 mg Oral QHS Clapacs, John T, MD   25 mg at 09/11/20 2142   nicotine (NICODERM CQ - dosed in mg/24 hours) patch 21  mg  21 mg Transdermal Q24H Selina Cooley M, MD   21 mg at 09/11/20 2300   pantoprazole (PROTONIX) EC tablet 40 mg  40 mg Oral Daily Clapacs, John T, MD   40 mg at 09/12/20 0739   potassium chloride SA (KLOR-CON) CR tablet 40 mEq  40 mEq Oral Once Salley Scarlet, MD       PTA Medications: Medications Prior to Admission  Medication Sig Dispense Refill Last Dose   meloxicam (MOBIC) 15 MG tablet Take 15 mg by mouth daily.      albuterol (VENTOLIN HFA) 108 (90 Base) MCG/ACT inhaler Inhale 1-2 puffs into the lungs every 4 (four) hours as needed for wheezing or shortness of breath. 1 Inhaler 1    amphetamine-dextroamphetamine (ADDERALL) 10 MG tablet Take 10 mg by mouth 2 (two) times daily. For 30 days      Buprenorphine HCl-Naloxone HCl 8-2 MG FILM Place 1 strip under the tongue 3 (three) times daily.      cetirizine (ZYRTEC) 10 MG tablet Take 10 mg by mouth daily.      COSENTYX SENSOREADY, 300 MG, 150 MG/ML SOAJ Inject 300 mg into the  skin every 28 (twenty-eight) days.      diclofenac Sodium (VOLTAREN) 1 % GEL Apply 4 g topically 4 (four) times daily as needed (back pain).      DULoxetine (CYMBALTA) 60 MG capsule Take 1 capsule (60 mg total) by mouth daily. 30 capsule 1    famotidine (PEPCID) 20 MG tablet TAKE 1 TABLET BY MOUTH AT BEDTIME. (Patient taking differently: Take 20 mg by mouth 2 (two) times daily.) 30 tablet 1    folic acid (FOLVITE) 1 MG tablet Take 1 mg by mouth daily.      furosemide (LASIX) 20 MG tablet Take 40 mg by mouth daily.      gabapentin (NEURONTIN) 300 MG capsule Take 1 capsule (300 mg total) by mouth 4 (four) times daily -  with meals and at bedtime. 120 capsule 1    lamoTRIgine 25 & 50 & 100 MG KIT Take 25-100 mg by mouth as directed. 1 kit 0    levothyroxine (SYNTHROID) 75 MCG tablet Take 75 mcg by mouth daily before breakfast.      losartan (COZAAR) 100 MG tablet Take 100 mg by mouth daily.      lubiprostone (AMITIZA) 24 MCG capsule Take 24 mcg by mouth daily with breakfast.      methocarbamol (ROBAXIN) 750 MG tablet Take 1 tablet (750 mg total) by mouth 4 (four) times daily. 120 tablet 1    MYRBETRIQ 25 MG TB24 tablet Take 25 mg by mouth at bedtime.      omeprazole (PRILOSEC) 40 MG capsule Take 40 mg by mouth daily.      ondansetron (ZOFRAN ODT) 4 MG disintegrating tablet Take 1 tablet (4 mg total) by mouth every 8 (eight) hours as needed for nausea or vomiting. 20 tablet 0    risperiDONE (RISPERDAL) 2 MG tablet Take 1 tablet (2 mg total) by mouth at bedtime. 30 tablet 1    SYMBICORT 160-4.5 MCG/ACT inhaler Inhale 2 puffs into the lungs 2 (two) times daily.       topiramate (TOPAMAX) 50 MG tablet Take 150 mg by mouth daily.      umeclidinium bromide (INCRUSE ELLIPTA) 62.5 MCG/INH AEPB Inhale 1 puff into the lungs daily.      VRAYLAR 1.5 MG capsule Take 1.5 mg by mouth daily.       Patient  Stressors:    Patient Strengths:    Treatment Modalities: Medication Management, Group therapy, Case  management,  1 to 1 session with clinician, Psychoeducation, Recreational therapy.   Physician Treatment Plan for Primary Diagnosis: Bipolar 1 disorder, mixed, severe ([place]) Long Term Goal(s): Improvement in symptoms so as ready for discharge   Short Term Goals: Ability to identify changes in lifestyle to reduce recurrence of condition will improve Ability to verbalize feelings will improve Ability to disclose and discuss suicidal ideas Ability to demonstrate self-control will improve Ability to identify and develop effective coping behaviors will improve Ability to maintain clinical measurements within normal limits will improve Compliance with prescribed medications will improve Ability to identify triggers associated with substance abuse/mental health issues will improve  Medication Management: Evaluate patient's response, side effects, and tolerance of medication regimen.  Therapeutic Interventions: 1 to 1 sessions, Unit Group sessions and Medication administration.  Evaluation of Outcomes: Progressing  Physician Treatment Plan for Secondary Diagnosis: Principal Problem:   Bipolar 1 disorder, mixed, severe ([place]) Active Problems:   Borderline personality disorder ([place])   SUI (stress urinary incontinence, female)   Hypothyroidism due to acquired atrophy of thyroid   Essential hypertension   Constipation   Opiate abuse, continuous ([place])  Long Term Goal(s): Improvement in symptoms so as ready for discharge   Short Term Goals: Ability to identify changes in lifestyle to reduce recurrence of condition will improve Ability to verbalize feelings will improve Ability to disclose and discuss suicidal ideas Ability to demonstrate self-control will improve Ability to identify and develop effective coping behaviors will improve Ability to maintain clinical measurements within normal limits will improve Compliance with prescribed medications will improve Ability to identify triggers  associated with substance abuse/mental health issues will improve     Medication Management: Evaluate patient's response, side effects, and tolerance of medication regimen.  Therapeutic Interventions: 1 to 1 sessions, Unit Group sessions and Medication administration.  Evaluation of Outcomes: Progressing   RN Treatment Plan for Primary Diagnosis: Bipolar 1 disorder, mixed, severe ([place]) Long Term Goal(s): Knowledge of disease and therapeutic regimen to maintain health will improve  Short Term Goals: Ability to remain free from injury will improve, Ability to verbalize frustration and anger appropriately will improve, Ability to demonstrate self-control, Ability to participate in decision making will improve, Ability to verbalize feelings will improve, Ability to disclose and discuss suicidal ideas, Ability to identify and develop effective coping behaviors will improve, and Compliance with prescribed medications will improve  Medication Management: RN will administer medications as ordered by provider, will assess and evaluate patient's response and provide education to patient for prescribed medication. RN will report any adverse and/or side effects to prescribing provider.  Therapeutic Interventions: 1 on 1 counseling sessions, Psychoeducation, Medication administration, Evaluate responses to treatment, Monitor vital signs and CBGs as ordered, Perform/monitor CIWA, COWS, AIMS and Fall Risk screenings as ordered, Perform wound care treatments as ordered.  Evaluation of Outcomes: Progressing   LCSW Treatment Plan for Primary Diagnosis: Bipolar 1 disorder, mixed, severe ([place]) Long Term Goal(s): Safe transition to appropriate next level of care at discharge, Engage patient in therapeutic group addressing interpersonal concerns.  Short Term Goals: Engage patient in aftercare planning with referrals and resources, Increase social support, Increase ability to appropriately verbalize feelings,  Increase emotional regulation, Facilitate acceptance of mental health diagnosis and concerns, Facilitate patient progression through stages of change regarding substance use diagnoses and concerns, Identify triggers associated with mental health/substance abuse issues, and Increase skills for wellness  and recovery  Therapeutic Interventions: Assess for all discharge needs, 1 to 1 time with Education officer, museum, Explore available resources and support systems, Assess for adequacy in community support network, Educate family and significant other(s) on suicide prevention, Complete Psychosocial Assessment, Interpersonal group therapy.  Evaluation of Outcomes: Progressing   Progress in Treatment: Attending groups: No. Participating in groups: No. Taking medication as prescribed: Yes. Toleration medication: Yes. Family/Significant other contact made: No, will contact:  CSW will reach colateral when provided consent from patient.  Patient understands diagnosis: Yes. Discussing patient identified problems/goals with staff: Yes. Medical problems stabilized or resolved: Yes. Denies suicidal/homicidal ideation: Yes. Issues/concerns per patient self-inventory: Yes. Other: none   New problem(s) identified: No, Describe:  No additional problems identified at this time.   New Short Term/Long Term Goal(s): detox, elimination of symptoms of psychosis, medication management for mood stabilization; elimination of SI thoughts; development of comprehensive mental wellness plan.  Patient Goals: "Nothing I need or want to work on . . . I am not suicidal or homicidal"   Discharge Plan or Barriers: None identified at this time.    Reason for Continuation of Hospitalization: Delusions  Depression Hallucinations  Estimated Length of Stay: 1-7 days    Scribe for Treatment Team: Larose Kells 09/12/2020 10:43 AM
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
## 2020-09-12 NOTE — Progress Notes (Signed)
Recreation Therapy Notes  INPATIENT RECREATION THERAPY ASSESSMENT  Patient Details Name: Kirsten Richardson MRN: [id number] DOB: July 16, 1975 Today's Date: 09/12/2020       Information Obtained From: Patient (Patient refused stating "I am okay, I do not need anything".)  Able to Participate in Assessment/Interview:    Patient Presentation:    Reason for Admission (Per Patient):    Patient Stressors:    Coping Skills:      Leisure Interests (2+):     Frequency of Recreation/Participation:    Awareness of Community Resources:     [employer]:     Current Use:    If no, Barriers?:    Expressed Interest in [employer] Information:    [place] of Residence:     Patient Main Form of Transportation:    Patient Strengths:     Patient Identified Areas of Improvement:     Patient Goal for Hospitalization:     Current SI (including self-harm):     Current HI:     Current AVH:    Staff Intervention Plan:    Consent to Intern Participation:    Zyere Jiminez 09/12/2020, 4:11 PM

## 2020-09-12 NOTE — Group Note (Signed)
[facility] LCSW Group Therapy Note   Group Date: 09/12/2020 Start Time: 1330 End Time: 1430   Type of Therapy/Topic:  Group Therapy:  Emotion Regulation  Participation Level:  Did Not Attend    Description of Group:    The purpose of this group is to assist patients in learning to regulate negative emotions and experience positive emotions. Patients will be guided to discuss ways in which they have been vulnerable to their negative emotions. These vulnerabilities will be juxtaposed with experiences of positive emotions or situations, and patients challenged to use positive emotions to combat negative ones. Special emphasis will be placed on coping with negative emotions in conflict situations, and patients will process healthy conflict resolution skills.  Therapeutic Goals: Patient will identify two positive emotions or experiences to reflect on in order to balance out negative emotions:  Patient will label two or more emotions that they find the most difficult to experience:  Patient will be able to demonstrate positive conflict resolution skills through discussion or role plays:   Summary of Patient Progress: X    Therapeutic Modalities:   Cognitive Behavioral Therapy Feelings Identification Dialectical Behavioral Therapy   Kirsten Richardson A Phuong Moffatt, LCSWA 

## 2020-09-12 NOTE — Progress Notes (Signed)
Patient denies suicidal ideations, homicidal ideations, and auditory and visual hallucinations. She rates depression and anxiety as a 7/10. Patient speech is logical and coherent. However, she is intermittently disoriented to situation. Patient is compliant with medications, except for Suboxone. Patient states that she does not need it and denies any withdrawal symptoms. Patient isolates to her room, but comes out for meals. Patient remains safe on the unit at this time.

## 2020-09-12 NOTE — Progress Notes (Signed)
Recreation Therapy Notes   Date: 09/12/2020  Time: 9:45am   Location: Courtyard   Behavioral response: N/A   Intervention Topic: Social-Skills   Discussion/Intervention: Patient did not attend group.   Clinical Observations/Feedback:  Patient did not attend group.   Shyleigh Daughtry LRT/CTRS         Yasuko Lapage 09/12/2020 11:26 AM

## 2020-09-12 NOTE — Plan of Care (Signed)
  Problem: [facility] Concurrent Medical Problem Goal: STG-Compliance with medication and/or treatment as ordered Description: (STG-Compliance with medication and/or treatment as ordered by MD) Outcome: Progressing

## 2020-09-12 NOTE — Progress Notes (Signed)
Recreation Therapy Notes  INPATIENT RECREATION TR PLAN  Patient Details Name: JILLAYNE WITTE MRN: [id number] DOB: 13-Jan-1975 Today's Date: 09/12/2020  Rec Therapy Plan Is patient appropriate for Therapeutic Recreation?: Yes Treatment times per week: at least 3 Estimated Length of Stay: 5-7 days TR Treatment/Interventions: Group participation (Comment)  Discharge Criteria Pt will be discharged from therapy if:: Discharged Treatment plan/goals/alternatives discussed and agreed upon by:: Patient/family  Discharge Summary     Hershel Corkery 09/12/2020, 4:12 PM

## 2020-09-12 NOTE — Progress Notes (Addendum)
Patient alert and oriented x 3 with periods of confusion noted to situation, her  affect is blunted thoughts are organized x 4, no distress noted, she is interacting appropriately with peers and staff, she appears less anxious, complaint with medication regimen, denies SI/HI/AVH 15 minutes safety checks maintained will continue to monitor.

## 2020-09-12 NOTE — [facility] Counselor (Signed)
CSW attempted to meet with pt to complete the PSA prior to lunch hour. Pt was sleeping soundly and in no acute distress.   CSW attempted to meet with pt after group. When asked about completing assessment, pt declined, stating that she would like to do it later. CSW stated that final attempt would be made tomorrow. No concerns expressed. Contact ended without incident.   Vilma Meckel. Algis Greenhouse, MSW, LCSW, LCAS 09/12/2020 3:51 PM

## 2020-09-13 DIAGNOSIS — F3163 Bipolar disorder, current episode mixed, severe, without psychotic features: Secondary | ICD-10-CM | POA: Diagnosis not present

## 2020-09-13 LAB — BASIC METABOLIC PANEL
Anion gap: 6 (ref 5–15)
BUN: 9 mg/dL (ref 6–20)
CO2: 32 mmol/L (ref 22–32)
Calcium: 8.8 mg/dL — ABNORMAL LOW (ref 8.9–10.3)
Chloride: 102 mmol/L (ref 98–111)
Creatinine, Ser: 0.91 mg/dL (ref 0.44–1.00)
GFR, Estimated: >60 mL/min (ref >60)
Glucose, Bld: 88 mg/dL (ref 70–99)
Potassium: 4 mmol/L (ref 3.5–5.1)
Sodium: 140 mmol/L (ref 135–145)

## 2020-09-13 MED ORDER — CARIPRAZINE HCL 3 MG PO CAPS: 3.0000 mg | ORAL_CAPSULE | Freq: Every day | ORAL | 1 refills | Status: DC

## 2020-09-13 MED ORDER — ATORVASTATIN CALCIUM 20 MG PO TABS: 20.0000 mg | ORAL_TABLET | Freq: Every day | ORAL | 1 refills | Status: DC

## 2020-09-13 MED ORDER — LAMOTRIGINE 150 MG PO TABS: 300.0000 mg | ORAL_TABLET | Freq: Every day | ORAL | 1 refills | Status: DC

## 2020-09-13 NOTE — Progress Notes (Signed)
  [facility] Adult Case Management Discharge Plan :  Will you be returning to the same living situation after discharge:  Yes,  pt plans to return home. At discharge, do you have transportation home?: Yes,  CSW to assist with transportation. Do you have the ability to pay for your medications: Yes,  [facility] Medicare  Release of information consent forms completed and in the chart;  Patient's signature needed at discharge.  Patient to Follow up at:  Follow-up Information     [facility] Follow up.   Why: Please contact CST team regarding follow up. Thanks! Contact information: [address] [place] [address] [phone]                 Next level of care provider has access to [facility] Link:no  Safety Planning and Suicide Prevention discussed: Yes,  SPE completed with pt.      Has patient been referred to the Quitline?: Patient refused referral  Patient has been referred for addiction treatment: Pt. refused referral  Glenis Smoker, LCSW 09/13/2020, 10:03 AM
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
## 2020-09-13 NOTE — Progress Notes (Signed)
Patient spent all of last evening in room. No interaction with peers, minimal with staff. Denies all. Patient remains safe on unit with q 15 min checks.

## 2020-09-13 NOTE — Care Management Important Message (Signed)
Important Message  Patient Details  Name: Kirsten Richardson MRN: [id number] Date of Birth: October 10, 1975   Medicare Important Message Given:  Yes  Pt was given IM. She denied any interest in appealing her discharge as she stated that she did not need to be here.    Glenis Smoker, LCSW 09/13/2020, 12:57 PM

## 2020-09-13 NOTE — [facility] Counselor (Signed)
CSW met with pt briefly to discuss discharge. She agreed to have [place] team contacted but declined contact with her psychiatrist, stating that she would contact the psychiatrist herself as she was sent here against her will. CSW agreed. Address was confirmed as [address], [place], [place]. No other concerns expressed. Contact ended without incident.   Chalmers Guest. Guerry Bruin, MSW, [place], [place] 09/13/2020 10:06 AM

## 2020-09-13 NOTE — Progress Notes (Signed)
Patient alert and oriented x 4.  Affect is pleasant and calm.  Denies AVH, SI or HI.  Patient reports sleeping well with no need for medication, appetite good, energy level normal, rates depression 0/10 and anxiety 0/10. Patient compliant with meds. Reports generalized pain due to fibromyalgia - scheduled meds given with relief reported.  Reviewed POC with patient.  Questions answered and understanding verbalized. Ongoing Q15 minute safety check rounds per unit protocol.

## 2020-09-13 NOTE — [facility] Suicide Risk Assessment (Signed)
[facility] Discharge Suicide Risk Assessment   Principal Problem: Bipolar 1 disorder, mixed, severe (HCC) Discharge Diagnoses: Principal Problem:   Bipolar 1 disorder, mixed, severe (HCC) Active Problems:   Borderline personality disorder (HCC)   SUI (stress urinary incontinence, female)   Hypothyroidism due to acquired atrophy of thyroid   Essential hypertension   Constipation   Opiate abuse, continuous (HCC)   Total Time spent with patient: 35 minutes- 25 minutes face-to-face contact with patient, 10 minutes documentation, coordination of care, scripts   Musculoskeletal: Strength & Muscle Tone: within normal limits Gait & Station: normal Patient leans: N/A  Psychiatric Specialty Exam  Presentation  General Appearance: Casual; Appropriate for Environment  Eye Contact:Good  Speech:Clear and Coherent; Normal Rate  Speech Volume:Normal  Handedness:Right   Mood and Affect  Mood:Euthymic  Duration of Depression Symptoms: Greater than two weeks  Affect:Congruent   Thought Process  Thought Processes:Coherent; Goal Directed  Descriptions of Associations:Intact  Orientation:Full (Time, Place and Person)  Thought Content:Logical  History of Schizophrenia/Schizoaffective disorder:Yes  Duration of Psychotic Symptoms:Less than six months  Hallucinations:Hallucinations: Auditory  Ideas of Reference:None  Suicidal Thoughts:Suicidal Thoughts: No  Homicidal Thoughts:Homicidal Thoughts: No   Sensorium  Memory:Immediate Good; Recent Good; Remote Good  Judgment:Intact  Insight:Present   Executive Functions  Concentration:Good  Attention Span:Good  Recall:Good  Fund of Knowledge:Good  Language:Good   Psychomotor Activity  Psychomotor Activity:Psychomotor Activity: Normal   Assets  Assets:Communication Skills; Desire for Improvement; Financial Resources/Insurance; Housing; Physical Health; Resilience; Social Support   Sleep  Sleep:Sleep:  Good Number of Hours of Sleep: 8.25   Physical Exam: Physical Exam ROS Blood pressure 104/67, pulse 81, temperature 98.1 F (36.7 C), temperature source Oral, resp. rate 18, height 4\' 11"  (1.499 m), weight 71.7 kg, last menstrual period 09/09/2020, SpO2 98 %. Body mass index is 31.91 kg/m.  Mental Status Per Nursing Assessment::   On Admission:  NA  Demographic Factors:  Caucasian and Living alone  Loss Factors: NA  Historical Factors: Prior suicide attempts and Impulsivity  Risk Reduction Factors:   Sense of responsibility to family, Religious beliefs about death, Positive social support, Positive therapeutic relationship, and Positive coping skills or problem solving skills  Continued Clinical Symptoms:  Bipolar Disorder:   Mixed State Previous Psychiatric Diagnoses and Treatments  Cognitive Features That Contribute To Risk:  None    Suicide Risk:  Minimal: No identifiable suicidal ideation.  Patients presenting with no risk factors but with morbid ruminations; may be classified as minimal risk based on the severity of the depressive symptoms    Plan Of Care/Follow-up recommendations:  Activity:  as tolerated Diet:  low sodium heart healthy diet  [phone], MD 09/13/2020, 8:57 AM
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
## 2020-09-13 NOTE — [facility] Suicide Risk Assessment (Signed)
[facility] INPATIENT:  Family/Significant Other Suicide Prevention Education  Suicide Prevention Education:  Patient Refusal for Family/Significant Other Suicide Prevention Education: The patient Kirsten Richardson has refused to provide written consent for family/significant other to be provided Family/Significant Other Suicide Prevention Education during admission and/or prior to discharge.  Physician notified.  SPE completed with pt, as pt refused to consent to family contact. SPI pamphlet provided to pt and pt was encouraged to share information with support network, ask questions, and talk about any concerns relating to SPE. Pt denies access to guns/firearms and verbalized understanding of information provided. Mobile Crisis information also provided to pt.  Glenis Smoker 09/13/2020, 9:45 AM

## 2020-09-13 NOTE — Plan of Care (Signed)
Patient alert and oriented x 4.  Affect is pleasant and calm. Denies anxiety, SI/HI or AVH.  Patient states they will try to keep themselves safe when they return home.  Reviewed discharge instructions with patient including follow up appointment with provider, medication and prescriptions.  Questions answered and understanding verbalized.  Discharge packet given.  All belongings returned to patient after verification completed by staff.    Patient escorted by staff off unit at this time stable without complaint.  Problem: Consults Goal: Concurrent Medical Patient Education Description: (See Patient Education Module for education specifics) Outcome: Adequate for Discharge   Problem: [facility] Concurrent Medical Problem Goal: LTG-Pt will be physically stable and he/significant other Description: (Patient will be physically stable and he/significant other will be able to verbalize understanding of follow-up care and symptoms that would warrant further treatment) Outcome: Adequate for Discharge Goal: STG-Vital signs will be within defined limits or stabilized Description: (STG- Vital signs will be within defined limits or stabilized for individual) Outcome: Adequate for Discharge Goal: STG-Compliance with medication and/or treatment as ordered Description: (STG-Compliance with medication and/or treatment as ordered by MD) Outcome: Adequate for Discharge Goal: STG-Verbalize two symptoms that would warrant further Description: (STG-Verbalize two symptoms that would warrant further treatment) Outcome: Adequate for Discharge Goal: STG-Patient will participate in management/stabilization Description: (STG-Patient will participate in management/stabilization of medical condition) Outcome: Adequate for Discharge Goal: STG-Other (Specify): Description: STG-Other Concurrent Medical (Specify): Outcome: Adequate for Discharge

## 2020-09-21 ENCOUNTER — Other Ambulatory Visit: Payer: Self-pay

## 2020-09-21 ENCOUNTER — Emergency Department
Admission: EM | Admit: 2020-09-21 | Discharge: 2020-09-24 | Disposition: A | Discharge status: Home / Self Care | Payer: Medicare Other | Attending: Emergency Medicine | Admitting: Emergency Medicine

## 2020-09-21 DIAGNOSIS — Z20822 Contact with and (suspected) exposure to covid-19: Secondary | ICD-10-CM | POA: Diagnosis not present

## 2020-09-21 DIAGNOSIS — F603 Borderline personality disorder: Secondary | ICD-10-CM | POA: Diagnosis present

## 2020-09-21 DIAGNOSIS — J449 Chronic obstructive pulmonary disease, unspecified: Secondary | ICD-10-CM | POA: Diagnosis not present

## 2020-09-21 DIAGNOSIS — Z87891 Personal history of nicotine dependence: Secondary | ICD-10-CM | POA: Diagnosis not present

## 2020-09-21 DIAGNOSIS — I10 Essential (primary) hypertension: Secondary | ICD-10-CM | POA: Diagnosis not present

## 2020-09-21 DIAGNOSIS — E039 Hypothyroidism, unspecified: Secondary | ICD-10-CM | POA: Insufficient documentation

## 2020-09-21 DIAGNOSIS — F3163 Bipolar disorder, current episode mixed, severe, without psychotic features: Secondary | ICD-10-CM | POA: Diagnosis present

## 2020-09-21 DIAGNOSIS — Y908 Blood alcohol level of 240 mg/100 ml or more: Secondary | ICD-10-CM | POA: Diagnosis not present

## 2020-09-21 DIAGNOSIS — F29 Unspecified psychosis not due to a substance or known physiological condition: Secondary | ICD-10-CM | POA: Insufficient documentation

## 2020-09-21 DIAGNOSIS — Z7951 Long term (current) use of inhaled steroids: Secondary | ICD-10-CM | POA: Insufficient documentation

## 2020-09-21 DIAGNOSIS — Z046 Encounter for general psychiatric examination, requested by authority: Secondary | ICD-10-CM | POA: Diagnosis not present

## 2020-09-21 DIAGNOSIS — Z79899 Other long term (current) drug therapy: Secondary | ICD-10-CM | POA: Insufficient documentation

## 2020-09-21 LAB — ACETAMINOPHEN LEVEL: Acetaminophen (Tylenol), Serum: <10 ug/mL — ABNORMAL LOW (ref 10–30)

## 2020-09-21 LAB — COMPREHENSIVE METABOLIC PANEL
ALT: 52 U/L — ABNORMAL HIGH (ref 0–44)
AST: 75 U/L — ABNORMAL HIGH (ref 15–41)
Albumin: 4.4 g/dL (ref 3.5–5.0)
Alkaline Phosphatase: 72 U/L (ref 38–126)
Anion gap: 9 (ref 5–15)
BUN: 9 mg/dL (ref 6–20)
CO2: 22 mmol/L (ref 22–32)
Calcium: 9 mg/dL (ref 8.9–10.3)
Chloride: 106 mmol/L (ref 98–111)
Creatinine, Ser: 1.21 mg/dL — ABNORMAL HIGH (ref 0.44–1.00)
GFR, Estimated: 57 mL/min — ABNORMAL LOW (ref >60)
Glucose, Bld: 112 mg/dL — ABNORMAL HIGH (ref 70–99)
Potassium: 3.6 mmol/L (ref 3.5–5.1)
Sodium: 137 mmol/L (ref 135–145)
Total Bilirubin: 0.7 mg/dL (ref 0.3–1.2)
Total Protein: 7 g/dL (ref 6.5–8.1)

## 2020-09-21 LAB — CBC
HCT: 38.3 % (ref 36.0–46.0)
Hemoglobin: 13.5 g/dL (ref 12.0–15.0)
MCH: 33.1 pg (ref 26.0–34.0)
MCHC: 35.2 g/dL (ref 30.0–36.0)
MCV: 93.9 fL (ref 80.0–100.0)
Platelets: 393 10*3/uL (ref 150–400)
RBC: 4.08 MIL/uL (ref 3.87–5.11)
RDW: 12.5 % (ref 11.5–15.5)
WBC: 5.5 10*3/uL (ref 4.0–10.5)
nRBC: 0 % (ref 0.0–0.2)

## 2020-09-21 LAB — SALICYLATE LEVEL: Salicylate Lvl: <7 mg/dL — ABNORMAL LOW (ref 7.0–30.0)

## 2020-09-21 LAB — ETHANOL: Alcohol, Ethyl (B): <10 mg/dL (ref <10)

## 2020-09-21 NOTE — ED Triage Notes (Signed)
Pt here with bpd with ivc papers for hoarding medication and acting delusional. Pt is rambling in triage, denies SI or HI.

## 2020-09-21 NOTE — ED Notes (Addendum)
Pt dressed out into hospital provided scrubs with this tech and Hailey,EDT in the rm. Pt belongings consist of a pink shirt, a tan bra, multicolored shorts, dark grey shoes, one bottle of perfume, 4 ink pens, 8 markers, multiple sheets of stickers, one loose key, a yellow colored ball earring, a pink key chain with five keys, and a brown bracelet. Pt has a navel piercing that she refuses to take out. Pt stated, "I am not taking it out. Nobody has ever made me take it out before and I am not doing it now." Pt has a white colored ring and a yellow colored ring on her ringers that she is unable to take off d/t her fingers being swollen. Pt belongings placed into one pt belongings bag and labeled with pt name.

## 2020-09-22 DIAGNOSIS — F3163 Bipolar disorder, current episode mixed, severe, without psychotic features: Secondary | ICD-10-CM | POA: Diagnosis not present

## 2020-09-22 LAB — RESP PANEL BY RT-PCR (FLU A&B, COVID) ARPGX2
Influenza A by PCR: NEGATIVE
Influenza B by PCR: NEGATIVE
SARS Coronavirus 2 by RT PCR: NEGATIVE

## 2020-09-22 LAB — URINE DRUG SCREEN, QUALITATIVE (ARMC ONLY)
Amphetamines, Ur Screen: NOT DETECTED
Barbiturates, Ur Screen: NOT DETECTED
Benzodiazepine, Ur Scrn: NOT DETECTED
Cannabinoid 50 Ng, Ur ~~LOC~~: NOT DETECTED
Cocaine Metabolite,Ur ~~LOC~~: NOT DETECTED
MDMA (Ecstasy)Ur Screen: NOT DETECTED
Methadone Scn, Ur: NOT DETECTED
Opiate, Ur Screen: NOT DETECTED
Phencyclidine (PCP) Ur S: NOT DETECTED
Tricyclic, Ur Screen: NOT DETECTED

## 2020-09-22 LAB — PREGNANCY, URINE: Preg Test, Ur: NEGATIVE

## 2020-09-22 MED ORDER — ATORVASTATIN CALCIUM 20 MG PO TABS
20.0000 mg | ORAL_TABLET | Freq: Every day | ORAL | Status: DC
  Administered 2020-09-22 – 2020-09-23 (×2): 20 mg via ORAL
  Filled 2020-09-22 (×2): qty 1

## 2020-09-22 MED ORDER — GABAPENTIN 300 MG PO CAPS
300.0000 mg | ORAL_CAPSULE | Freq: Three times a day (TID) | ORAL | Status: DC
  Administered 2020-09-22 – 2020-09-23 (×2): 300 mg via ORAL
  Filled 2020-09-22 (×4): qty 1

## 2020-09-22 MED ORDER — LEVOTHYROXINE SODIUM 50 MCG PO TABS
75.0000 ug | ORAL_TABLET | Freq: Every day | ORAL | Status: DC
  Administered 2020-09-23: 75 ug via ORAL
  Filled 2020-09-22: qty 2

## 2020-09-22 MED ORDER — DULOXETINE HCL 60 MG PO CPEP
60.0000 mg | ORAL_CAPSULE | Freq: Every day | ORAL | Status: DC
  Administered 2020-09-22 – 2020-09-23 (×2): 60 mg via ORAL
  Filled 2020-09-22 (×2): qty 1

## 2020-09-22 MED ORDER — LOSARTAN POTASSIUM 50 MG PO TABS
100.0000 mg | ORAL_TABLET | Freq: Every day | ORAL | Status: DC
  Administered 2020-09-22 – 2020-09-23 (×2): 100 mg via ORAL
  Filled 2020-09-22 (×2): qty 2

## 2020-09-22 MED ORDER — LAMOTRIGINE 25 MG PO TABS
250.0000 mg | ORAL_TABLET | Freq: Every day | ORAL | Status: DC
  Administered 2020-09-22 – 2020-09-23 (×2): 250 mg via ORAL
  Filled 2020-09-22 (×2): qty 2

## 2020-09-22 MED ORDER — PANTOPRAZOLE SODIUM 40 MG PO TBEC
40.0000 mg | DELAYED_RELEASE_TABLET | Freq: Every day | ORAL | Status: DC
  Administered 2020-09-22 – 2020-09-23 (×2): 40 mg via ORAL
  Filled 2020-09-22 (×2): qty 1

## 2020-09-22 MED ORDER — FAMOTIDINE 20 MG PO TABS
20.0000 mg | ORAL_TABLET | Freq: Every day | ORAL | Status: DC
Start: 2020-09-22 — End: 2020-09-24
  Administered 2020-09-22 – 2020-09-23 (×2): 20 mg via ORAL
  Filled 2020-09-22 (×2): qty 1

## 2020-09-22 MED ORDER — ALBUTEROL SULFATE (2.5 MG/3ML) 0.083% IN NEBU: 3.0000 mL | INHALATION_SOLUTION | RESPIRATORY_TRACT | Status: DC | PRN

## 2020-09-22 MED ORDER — CARIPRAZINE HCL 3 MG PO CAPS
3.0000 mg | ORAL_CAPSULE | Freq: Every day | ORAL | Status: DC
  Administered 2020-09-22 – 2020-09-23 (×2): 3 mg via ORAL
  Filled 2020-09-22 (×4): qty 1

## 2020-09-22 NOTE — ED Notes (Signed)
Snack and drink was offered to patient, but refused to get a sandwich tray and a drink.

## 2020-09-22 NOTE — BH Assessment (Addendum)
Referral information for Psychiatric Hospitalization faxed to;   [facility] [phone]- [phone]),   [facility] [phone]),  [facility] [phone]), Currently under review with Jocelyn Lamer [phone] -or- [phone]), Durenda Age reports no adult beds available currently but will be reviewing referral tonight for upcoming discharges on Monday  [facility] [phone])

## 2020-09-22 NOTE — ED Notes (Signed)
Report given to Jen RN.
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
## 2020-09-22 NOTE — ED Notes (Signed)
Psych team to room
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
## 2020-09-22 NOTE — BH Assessment (Signed)
Patient refused to talk to psych team. However, recommend psychiatric inpatient hospitalization based on [facility] assessment and recommendation.

## 2020-09-22 NOTE — ED Notes (Signed)
Report received from [place], English as a second language teacher. Patient alert and oriented, warm and dry, and in no acute distress. Patient denies SI, HI, AVH and pain. Patient made aware of Q15 minute rounds and Psychologist, counselling presence for their safety. Patient instructed to come to this nurse with needs or concerns.

## 2020-09-22 NOTE — ED Notes (Signed)
Pt reports that she believes her sister has speakers on her phone that she has used and transferred to her own phone and now her sister knows where she is. Pt is uninterested in talking with staff and is attempting to go to sleep and not talk to nurse.

## 2020-09-22 NOTE — ED Notes (Signed)
Report received from Jeannett Senior, English as a second language teacher. Patient alert and oriented, warm and dry, and in no acute distress. Patient denies SI, HI, AVH and pain. Patient made aware of Q15 minute rounds and Psychologist, counselling presence for their safety. Patient instructed to come to this nurse with needs or concerns.

## 2020-09-23 DIAGNOSIS — F3163 Bipolar disorder, current episode mixed, severe, without psychotic features: Secondary | ICD-10-CM | POA: Diagnosis not present

## 2020-09-23 NOTE — ED Notes (Signed)
Pt given a cup of water per request.  

## 2020-09-23 NOTE — ED Provider Notes (Signed)
-----------------------------------------   2:10 PM on 09/23/2020 -----------------------------------------  The patient has been evaluated by the psychiatry APP Lord and cleared for discharge.  The IVC has been rescinded.  The patient is stable for discharge at this time.  Return precautions and follow-up information have been provided.   Dionne Bucy, MD 09/23/20 [phone]

## 2020-09-23 NOTE — ED Provider Notes (Signed)
Emergency Medicine Observation Re-evaluation Note  Kirsten Richardson is a 45 y.o. female, seen on rounds today.  Pt initially presented to the ED for complaints of IVC Currently, the patient is calm and cooperative.  Physical Exam  BP (!) 110/59 (BP Location: Left Arm)   Pulse 89   Temp 98.9 F (37.2 C) (Oral)   Resp 20   Ht 5' (1.524 m)   Wt 74.8 kg   LMP 09/09/2020   SpO2 95%   BMI 32.22 kg/m  Physical Exam  General: No apparent distress HEENT: moist mucous membranes CV: RRR Pulm: Normal WOB GI: soft and non tender MSK: no edema or cyanosis Neuro: face symmetric, moving all extremities  ED Course / MDM  EKG:   I have reviewed the labs performed to date as well as medications administered while in observation.  No changes overnight or new labs this morning  Plan  Current plan is for placement. Kirsten Richardson is under involuntary commitment.      Don Perking, [place], MD 09/23/20 [phone]

## 2020-09-23 NOTE — ED Notes (Signed)
Pt not offered snack at this time d/t pt being asleep.

## 2020-09-23 NOTE — Discharge Instructions (Addendum)
Return to the ER for any thoughts of wanting hurt yourself or anyone else, or any other new or worsening symptoms that concern you.
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
## 2020-09-23 NOTE — ED Notes (Signed)
IVC pending placement 

## 2020-09-23 NOTE — ED Notes (Signed)
Dinner tray given. No other needs found.  

## 2020-09-23 NOTE — ED Notes (Signed)
Papers rescinded, pend dispo

## 2020-09-23 NOTE — ED Notes (Signed)
Pt lives in boarding house and states she has no one to pick her up.  EDP and charge made aware.

## 2020-09-23 NOTE — ED Notes (Addendum)
Pt ambulatory independently down the hallway with this tech ambulating beside her roughly 43ft. Pt with steady gait. Dawn, RN made aware.

## 2020-09-23 NOTE — ED Notes (Signed)
Patient states has no one to call for transportation home, cab service not available on Sunday night for hospital to provide transportation home.

## 2020-09-23 NOTE — BH Assessment (Signed)
Writer spoke with the patient to complete an updated/reassessment. Patient denies SI/HI and AV/H.  Writer called and left a HIPPA Compliant message with [facility] Cordelia Pen E.), requesting a return phone call. Phone call wasn't returned.

## 2020-09-23 NOTE — ED Notes (Signed)
Pt given denture cream to use in bathroom.  Tube returned to RN.

## 2020-09-23 NOTE — ED Notes (Signed)
Pt asleep at this time, unable to collect vitals. Will collect pt vitals once awake. 

## 2020-09-23 NOTE — ED Notes (Addendum)
Pt has been sleeping majority of the shift.  Pt can be aroused after calling name and touching her leg.  Falls back asleep quickly.  Refused evening medication.  Pt has not eaten any of her meals today. EDP made aware.

## 2020-09-24 NOTE — ED Notes (Signed)
Patient sleeping, vital signs deferred until patient awake.

## 2020-09-24 NOTE — ED Notes (Signed)
Pt offered her breakfast tray and states that she isn't hungry at this time, pt informed that her tray would be left on the counter and to just let me know when she is ready for it, cont to monitor

## 2020-09-24 NOTE — ED Notes (Signed)
Pt finished eating her breakfast at this time and changing into her clothing, cab has been called for pt

## 2020-09-24 NOTE — ED Notes (Signed)
Report received from [facility], pt resting quietly with her eyes closed, resp even and unlabored

## 2020-09-24 NOTE — ED Notes (Signed)
Pt's medications retrieved from pharmacy, pt belongings retrieved from locked holding area.

## 2020-09-24 NOTE — ED Provider Notes (Signed)
Emergency Medicine Observation Re-evaluation Note  Kirsten Richardson is a 45 y.o. female, seen on rounds today.  Pt initially presented to the ED for complaints of IVC Currently, the patient is sleeping.  Physical Exam  BP 127/72 (BP Location: Left Arm)   Pulse 79   Temp 98.6 F (37 C) (Oral)   Resp 18   Ht 5' (1.524 m)   Wt 74.8 kg   LMP 09/09/2020   SpO2 97%   BMI 32.22 kg/m  Physical Exam Gen: No acute distress  Resp: Normal rise and fall of chest Neuro: Moving all four extremities Psych: Resting currently, calm and cooperative when awake    ED Course / MDM  EKG:   I have reviewed the labs performed to date as well as medications administered while in observation.  Recent changes in the last 24 hours include no acute events overnight.  Plan  Current plan is for discharge in the morning when transportation available.  Kirsten Richardson is not under involuntary commitment.     Kirsten Richardson, Layla Maw, DO 09/24/20 [phone]

## 2020-10-16 ENCOUNTER — Inpatient Hospital Stay
Admission: RE | Admit: 2020-10-16 | Discharge: 2020-10-29 | DRG: 885 | Disposition: A | Discharge status: Home / Self Care | Payer: Medicare Other | Source: Intra-hospital | Attending: Behavioral Health | Admitting: Behavioral Health

## 2020-10-16 ENCOUNTER — Encounter: Payer: Self-pay | Admitting: Psychiatry

## 2020-10-16 ENCOUNTER — Emergency Department (EMERGENCY_DEPARTMENT_HOSPITAL)
Admission: EM | Admit: 2020-10-16 | Discharge: 2020-10-16 | Disposition: A | Discharge status: Other Acute Inpatient Hospital | Payer: Medicare Other | Source: Home / Self Care | Attending: Emergency Medicine | Admitting: Emergency Medicine

## 2020-10-16 ENCOUNTER — Other Ambulatory Visit: Payer: Self-pay

## 2020-10-16 DIAGNOSIS — R45850 Homicidal ideations: Secondary | ICD-10-CM

## 2020-10-16 DIAGNOSIS — J439 Emphysema, unspecified: Secondary | ICD-10-CM | POA: Diagnosis present

## 2020-10-16 DIAGNOSIS — G8929 Other chronic pain: Secondary | ICD-10-CM | POA: Diagnosis present

## 2020-10-16 DIAGNOSIS — R45851 Suicidal ideations: Secondary | ICD-10-CM | POA: Diagnosis present

## 2020-10-16 DIAGNOSIS — F315 Bipolar disorder, current episode depressed, severe, with psychotic features: Principal | ICD-10-CM | POA: Diagnosis present

## 2020-10-16 DIAGNOSIS — J449 Chronic obstructive pulmonary disease, unspecified: Secondary | ICD-10-CM | POA: Diagnosis present

## 2020-10-16 DIAGNOSIS — F319 Bipolar disorder, unspecified: Secondary | ICD-10-CM | POA: Diagnosis present

## 2020-10-16 DIAGNOSIS — Z20822 Contact with and (suspected) exposure to covid-19: Secondary | ICD-10-CM | POA: Diagnosis present

## 2020-10-16 DIAGNOSIS — I10 Essential (primary) hypertension: Secondary | ICD-10-CM | POA: Insufficient documentation

## 2020-10-16 DIAGNOSIS — Z5900 Homelessness unspecified: Secondary | ICD-10-CM

## 2020-10-16 DIAGNOSIS — Z823 Family history of stroke: Secondary | ICD-10-CM

## 2020-10-16 DIAGNOSIS — E785 Hyperlipidemia, unspecified: Secondary | ICD-10-CM | POA: Diagnosis present

## 2020-10-16 DIAGNOSIS — F1110 Opioid abuse, uncomplicated: Secondary | ICD-10-CM | POA: Insufficient documentation

## 2020-10-16 DIAGNOSIS — Y900 Blood alcohol level of less than 20 mg/100 ml: Secondary | ICD-10-CM | POA: Insufficient documentation

## 2020-10-16 DIAGNOSIS — K219 Gastro-esophageal reflux disease without esophagitis: Secondary | ICD-10-CM | POA: Diagnosis present

## 2020-10-16 DIAGNOSIS — Z79899 Other long term (current) drug therapy: Secondary | ICD-10-CM

## 2020-10-16 DIAGNOSIS — F603 Borderline personality disorder: Secondary | ICD-10-CM | POA: Diagnosis present

## 2020-10-16 DIAGNOSIS — Z7951 Long term (current) use of inhaled steroids: Secondary | ICD-10-CM | POA: Insufficient documentation

## 2020-10-16 DIAGNOSIS — E034 Atrophy of thyroid (acquired): Secondary | ICD-10-CM | POA: Diagnosis present

## 2020-10-16 DIAGNOSIS — F1410 Cocaine abuse, uncomplicated: Secondary | ICD-10-CM | POA: Diagnosis present

## 2020-10-16 DIAGNOSIS — Z818 Family history of other mental and behavioral disorders: Secondary | ICD-10-CM

## 2020-10-16 DIAGNOSIS — E538 Deficiency of other specified B group vitamins: Secondary | ICD-10-CM | POA: Diagnosis present

## 2020-10-16 DIAGNOSIS — Z801 Family history of malignant neoplasm of trachea, bronchus and lung: Secondary | ICD-10-CM | POA: Diagnosis not present

## 2020-10-16 DIAGNOSIS — Z79890 Hormone replacement therapy (postmenopausal): Secondary | ICD-10-CM

## 2020-10-16 DIAGNOSIS — M5030 Other cervical disc degeneration, unspecified cervical region: Secondary | ICD-10-CM | POA: Diagnosis present

## 2020-10-16 DIAGNOSIS — Z87891 Personal history of nicotine dependence: Secondary | ICD-10-CM

## 2020-10-16 DIAGNOSIS — K5900 Constipation, unspecified: Secondary | ICD-10-CM | POA: Diagnosis present

## 2020-10-16 DIAGNOSIS — F419 Anxiety disorder, unspecified: Secondary | ICD-10-CM | POA: Diagnosis present

## 2020-10-16 DIAGNOSIS — M069 Rheumatoid arthritis, unspecified: Secondary | ICD-10-CM | POA: Diagnosis present

## 2020-10-16 DIAGNOSIS — N393 Stress incontinence (female) (male): Secondary | ICD-10-CM | POA: Diagnosis present

## 2020-10-16 DIAGNOSIS — K5909 Other constipation: Secondary | ICD-10-CM | POA: Diagnosis present

## 2020-10-16 DIAGNOSIS — F3163 Bipolar disorder, current episode mixed, severe, without psychotic features: Secondary | ICD-10-CM | POA: Insufficient documentation

## 2020-10-16 DIAGNOSIS — Z833 Family history of diabetes mellitus: Secondary | ICD-10-CM | POA: Diagnosis not present

## 2020-10-16 DIAGNOSIS — Z8249 Family history of ischemic heart disease and other diseases of the circulatory system: Secondary | ICD-10-CM

## 2020-10-16 DIAGNOSIS — E039 Hypothyroidism, unspecified: Secondary | ICD-10-CM | POA: Insufficient documentation

## 2020-10-16 DIAGNOSIS — L4050 Arthropathic psoriasis, unspecified: Secondary | ICD-10-CM | POA: Diagnosis present

## 2020-10-16 DIAGNOSIS — Z811 Family history of alcohol abuse and dependence: Secondary | ICD-10-CM | POA: Diagnosis not present

## 2020-10-16 LAB — URINALYSIS, COMPLETE (UACMP) WITH MICROSCOPIC
Bacteria, UA: NONE SEEN
Bilirubin Urine: NEGATIVE
Glucose, UA: NEGATIVE mg/dL
Hgb urine dipstick: NEGATIVE
Ketones, ur: NEGATIVE mg/dL
Leukocytes,Ua: NEGATIVE
Nitrite: NEGATIVE
Protein, ur: NEGATIVE mg/dL
Specific Gravity, Urine: 1.025 (ref 1.005–1.030)
pH: 5 (ref 5.0–8.0)

## 2020-10-16 LAB — COMPREHENSIVE METABOLIC PANEL
ALT: 20 U/L (ref 0–44)
AST: 29 U/L (ref 15–41)
Albumin: 3.4 g/dL — ABNORMAL LOW (ref 3.5–5.0)
Alkaline Phosphatase: 81 U/L (ref 38–126)
Anion gap: 11 (ref 5–15)
BUN: 10 mg/dL (ref 6–20)
CO2: 24 mmol/L (ref 22–32)
Calcium: 8.5 mg/dL — ABNORMAL LOW (ref 8.9–10.3)
Chloride: 100 mmol/L (ref 98–111)
Creatinine, Ser: 0.84 mg/dL (ref 0.44–1.00)
GFR, Estimated: >60 mL/min (ref >60)
Glucose, Bld: 93 mg/dL (ref 70–99)
Potassium: 3.3 mmol/L — ABNORMAL LOW (ref 3.5–5.1)
Sodium: 135 mmol/L (ref 135–145)
Total Bilirubin: 0.4 mg/dL (ref 0.3–1.2)
Total Protein: 5.9 g/dL — ABNORMAL LOW (ref 6.5–8.1)

## 2020-10-16 LAB — ETHANOL: Alcohol, Ethyl (B): <10 mg/dL (ref <10)

## 2020-10-16 LAB — URINE DRUG SCREEN, QUALITATIVE (ARMC ONLY)
Amphetamines, Ur Screen: POSITIVE — AB
Barbiturates, Ur Screen: NOT DETECTED
Benzodiazepine, Ur Scrn: NOT DETECTED
Cannabinoid 50 Ng, Ur ~~LOC~~: NOT DETECTED
Cocaine Metabolite,Ur ~~LOC~~: NOT DETECTED
MDMA (Ecstasy)Ur Screen: NOT DETECTED
Methadone Scn, Ur: NOT DETECTED
Opiate, Ur Screen: NOT DETECTED
Phencyclidine (PCP) Ur S: NOT DETECTED
Tricyclic, Ur Screen: NOT DETECTED

## 2020-10-16 LAB — RESP PANEL BY RT-PCR (FLU A&B, COVID) ARPGX2
Influenza A by PCR: NEGATIVE
Influenza B by PCR: NEGATIVE
SARS Coronavirus 2 by RT PCR: NEGATIVE

## 2020-10-16 LAB — CBC
HCT: 32.6 % — ABNORMAL LOW (ref 36.0–46.0)
Hemoglobin: 11.8 g/dL — ABNORMAL LOW (ref 12.0–15.0)
MCH: 34 pg (ref 26.0–34.0)
MCHC: 36.2 g/dL — ABNORMAL HIGH (ref 30.0–36.0)
MCV: 93.9 fL (ref 80.0–100.0)
Platelets: 339 10*3/uL (ref 150–400)
RBC: 3.47 MIL/uL — ABNORMAL LOW (ref 3.87–5.11)
RDW: 13.8 % (ref 11.5–15.5)
WBC: 7 10*3/uL (ref 4.0–10.5)
nRBC: 0 % (ref 0.0–0.2)

## 2020-10-16 LAB — PREGNANCY, URINE: Preg Test, Ur: NEGATIVE

## 2020-10-16 MED ORDER — INFLUENZA VAC SPLIT QUAD 0.5 ML IM SUSY
0.5000 mL | PREFILLED_SYRINGE | INTRAMUSCULAR | Status: DC
  Filled 2020-10-16: qty 0.5

## 2020-10-16 MED ORDER — ACETAMINOPHEN 325 MG PO TABS: 650.0000 mg | ORAL_TABLET | Freq: Four times a day (QID) | ORAL | Status: DC | PRN

## 2020-10-16 MED ORDER — MAGNESIUM HYDROXIDE 400 MG/5ML PO SUSP: 30.0000 mL | Freq: Every day | ORAL | Status: DC | PRN

## 2020-10-16 MED ORDER — ALUM & MAG HYDROXIDE-SIMETH 200-200-20 MG/5ML PO SUSP: 30.0000 mL | ORAL | Status: DC | PRN

## 2020-10-16 MED ORDER — ZIPRASIDONE MESYLATE 20 MG IM SOLR
20.0000 mg | Freq: Once | INTRAMUSCULAR | Status: AC
  Administered 2020-10-16: 20 mg via INTRAMUSCULAR

## 2020-10-16 NOTE — Tx Team (Addendum)
Initial Treatment Plan 10/16/2020 4:14 PM Kirsten Richardson ZOX:[id number]   PATIENT STRESSORS: Health problems   Substance abuse     PATIENT STRENGTHS: Capable of independent living  Motivation for treatment/growth    PATIENT IDENTIFIED PROBLEMS: Unable to complete assessment due to patient being drowsy and sedated.                      DISCHARGE CRITERIA:  Improved stabilization in mood, thinking, and/or behavior Motivation to continue treatment in a less acute level of care Need for constant or close observation no longer present  PRELIMINARY DISCHARGE PLAN: Placement in alternative living arrangements  PATIENT/FAMILY INVOLVEMENT: This treatment plan has been presented to and reviewed with the patient, Kirsten Richardson.  The patient has been given the opportunity to ask questions and make suggestions.  Celene Kras, RN 10/16/2020, 4:14 PM

## 2020-10-16 NOTE — Progress Notes (Signed)
Patient isolative to her room and sleeping. Patient given PRN medications earlier today and the medication is still affecting the patient. Patient is being monitored Q 15 minutes for safety per unit protocol. Pt remains safe on the unit.
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
## 2020-10-16 NOTE — Progress Notes (Signed)
Patient is drowsy upon arrival to the unit. Patient required staff assistance to walk because she stated she was feeling dizzy since she received medication this morning. Admission assessment and skin assessment was done in patient's room with [facility], RN. No contraband found. Patient has old scars on left arm and chest. Her great toe on her left foot appears pink and irritated. Per patient, it is infected and she had been taking antibiotics at home for it.   Patient states that she sometimes has auditory hallucinations, but that they are not as bad as they usually are. She denies SI and HI and time of admission. Patient denies alcohol and drug use, but it is noted that UDS was positive for amphetamines. Patient is falling asleep during interview and speech is soft and slurred, so admission interview was cut short.   Patient was reminded to use the call bell button for bed and to stay in bed because of the dizziness. Patient remains safe on the unit at this time.
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
## 2020-10-16 NOTE — BH Assessment (Signed)
Patient is to be admitted to [facility] by Dr. Toni Amend.  Attending Physician will be Dr. Neale Burly.   Patient has been assigned to room 301, by [facility] Charge Nurse [place].    ER staff is aware of the admission: Misty Stanley, ER Eulis Canner, Patient's Nurse

## 2020-10-16 NOTE — ED Triage Notes (Signed)
Pt here for psychiatric evaluation states is SI and HI, also wants to be admitted for depression.

## 2020-10-16 NOTE — ED Notes (Signed)
Pt removed Socks Gray shoes [employer] Beige bra White pants  Jeans Panties 2 large bags  All locked in [employer] area

## 2020-10-16 NOTE — ED Notes (Signed)
VOL/pending reassessment in AM 

## 2020-10-16 NOTE — ED Notes (Signed)
IVC/  PENDING  PLACEMENT 
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
## 2020-10-17 DIAGNOSIS — F319 Bipolar disorder, unspecified: Secondary | ICD-10-CM

## 2020-10-17 MED ORDER — ALBUTEROL SULFATE HFA 108 (90 BASE) MCG/ACT IN AERS
1.0000 | INHALATION_SPRAY | RESPIRATORY_TRACT | Status: DC | PRN
  Filled 2020-10-17: qty 6.7

## 2020-10-17 MED ORDER — LAMOTRIGINE 100 MG PO TABS
300.0000 mg | ORAL_TABLET | Freq: Every day | ORAL | Status: DC
  Administered 2020-10-17 – 2020-10-29 (×13): 300 mg via ORAL
  Filled 2020-10-17 (×13): qty 3

## 2020-10-17 MED ORDER — BUPRENORPHINE HCL-NALOXONE HCL 8-2 MG SL SUBL
1.0000 | SUBLINGUAL_TABLET | Freq: Every day | SUBLINGUAL | Status: AC
Start: 2020-10-17 — End: 2020-10-17
  Administered 2020-10-17: 1 via SUBLINGUAL
  Filled 2020-10-17: qty 1

## 2020-10-17 MED ORDER — CEPHALEXIN 500 MG PO CAPS
500.0000 mg | ORAL_CAPSULE | Freq: Three times a day (TID) | ORAL | Status: AC
  Administered 2020-10-17 – 2020-10-24 (×18): 500 mg via ORAL
  Filled 2020-10-17 (×19): qty 1

## 2020-10-17 MED ORDER — LEVOTHYROXINE SODIUM 50 MCG PO TABS
75.0000 ug | ORAL_TABLET | Freq: Every day | ORAL | Status: DC
Start: 2020-10-18 — End: 2020-10-29
  Administered 2020-10-18 – 2020-10-29 (×11): 75 ug via ORAL
  Filled 2020-10-17 (×11): qty 2

## 2020-10-17 MED ORDER — FOLIC ACID 1 MG PO TABS
1.0000 mg | ORAL_TABLET | Freq: Every day | ORAL | Status: DC
  Administered 2020-10-17 – 2020-10-29 (×13): 1 mg via ORAL
  Filled 2020-10-17 (×13): qty 1

## 2020-10-17 MED ORDER — ZIPRASIDONE HCL 20 MG PO CAPS
20.0000 mg | ORAL_CAPSULE | Freq: Every day | ORAL | Status: DC
  Administered 2020-10-17: 20 mg via ORAL
  Filled 2020-10-17: qty 1

## 2020-10-17 MED ORDER — MIRABEGRON ER 25 MG PO TB24
25.0000 mg | ORAL_TABLET | Freq: Every day | ORAL | Status: DC
  Administered 2020-10-17 – 2020-10-28 (×10): 25 mg via ORAL
  Filled 2020-10-17 (×13): qty 1

## 2020-10-17 MED ORDER — DULOXETINE HCL 30 MG PO CPEP
60.0000 mg | ORAL_CAPSULE | Freq: Every day | ORAL | Status: DC
Start: 2020-10-17 — End: 2020-10-29
  Administered 2020-10-17 – 2020-10-29 (×13): 60 mg via ORAL
  Filled 2020-10-17 (×13): qty 2

## 2020-10-17 MED ORDER — BUPRENORPHINE HCL-NALOXONE HCL 8-2 MG SL SUBL: 1.0000 | SUBLINGUAL_TABLET | Freq: Every day | SUBLINGUAL | Status: DC

## 2020-10-17 MED ORDER — RISPERIDONE 1 MG PO TABS
0.5000 mg | ORAL_TABLET | Freq: Every day | ORAL | Status: DC
  Administered 2020-10-17: 0.5 mg via ORAL
  Filled 2020-10-17: qty 1

## 2020-10-17 MED ORDER — ATORVASTATIN CALCIUM 20 MG PO TABS
20.0000 mg | ORAL_TABLET | Freq: Every day | ORAL | Status: DC
  Administered 2020-10-17 – 2020-10-28 (×11): 20 mg via ORAL
  Filled 2020-10-17 (×11): qty 1

## 2020-10-17 MED ORDER — BUPRENORPHINE HCL-NALOXONE HCL 2-0.5 MG SL SUBL
2.0000 | SUBLINGUAL_TABLET | Freq: Every day | SUBLINGUAL | Status: AC
Start: 2020-10-18 — End: 2020-10-18
  Administered 2020-10-18: 2 via SUBLINGUAL
  Filled 2020-10-17: qty 2

## 2020-10-17 MED ORDER — LUBIPROSTONE 24 MCG PO CAPS
24.0000 ug | ORAL_CAPSULE | ORAL | Status: DC
  Administered 2020-10-17 – 2020-10-27 (×6): 24 ug via ORAL
  Filled 2020-10-17 (×6): qty 1

## 2020-10-17 MED ORDER — NICOTINE 14 MG/24HR TD PT24
14.0000 mg | MEDICATED_PATCH | Freq: Every day | TRANSDERMAL | Status: DC
  Administered 2020-10-17 – 2020-10-23 (×6): 14 mg via TRANSDERMAL
  Filled 2020-10-17 (×7): qty 1

## 2020-10-17 MED ORDER — IBUPROFEN 600 MG PO TABS
600.0000 mg | ORAL_TABLET | Freq: Four times a day (QID) | ORAL | Status: DC | PRN
  Administered 2020-10-17 – 2020-10-28 (×11): 600 mg via ORAL
  Filled 2020-10-17 (×11): qty 1

## 2020-10-17 MED ORDER — BUPRENORPHINE HCL-NALOXONE HCL 2-0.5 MG SL SUBL
1.0000 | SUBLINGUAL_TABLET | Freq: Every day | SUBLINGUAL | Status: AC
  Administered 2020-10-19: 1 via SUBLINGUAL
  Filled 2020-10-17: qty 1

## 2020-10-17 NOTE — BHH Counselor (Signed)
Kirsten Richardson, patient, provided written consent for CSW team to coordinate aftercare at this time. CSW to have further conversations with patient regarding care in the community.   Patient currently receives MAT (suboxone) and CST services w/ [facility].    Signed:  Corky Crafts, MSW, [place], LCASA 10/17/2020 6:23 PM
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
## 2020-10-17 NOTE — BH IP Treatment Plan (Signed)
Interdisciplinary Treatment and Diagnostic Plan Update  10/17/2020 Time of Session: 0900 Kirsten Richardson MRN: [id number]  Principal Diagnosis: <principal problem not specified>  Secondary Diagnoses: Active Problems:   Bipolar disorder with depression ([place])   Current Medications:  Current Facility-Administered Medications  Medication Dose Route Frequency Provider Last Rate Last Admin   acetaminophen (TYLENOL) tablet 650 mg  650 mg Oral Q6H PRN Sherlon Handing, NP       albuterol (VENTOLIN HFA) 108 (90 Base) MCG/ACT inhaler 1-2 puff  1-2 puff Inhalation Q4H PRN Salley Scarlet, MD       alum & mag hydroxide-simeth (MAALOX/MYLANTA) 200-200-20 MG/5ML suspension 30 mL  30 mL Oral Q4H PRN Sherlon Handing, NP       atorvastatin (LIPITOR) tablet 20 mg  20 mg Oral QHS Salley Scarlet, MD       buprenorphine-naloxone (SUBOXONE) 8-2 mg per SL tablet 1 tablet  1 tablet Sublingual Daily Salley Scarlet, MD       Followed by   Derrill Memo ON 10/18/2020] buprenorphine-naloxone (SUBOXONE) 2-0.5 mg per SL tablet 2 tablet  2 tablet Sublingual Daily Salley Scarlet, MD       Followed by   Derrill Memo ON 10/19/2020] buprenorphine-naloxone (SUBOXONE) 2-0.5 mg per SL tablet 1 tablet  1 tablet Sublingual Daily Salley Scarlet, MD       DULoxetine (CYMBALTA) DR capsule 60 mg  60 mg Oral Daily Salley Scarlet, MD       folic acid (FOLVITE) tablet 1 mg  1 mg Oral Daily Salley Scarlet, MD       influenza vac split quadrivalent PF (FLUARIX) injection 0.5 mL  0.5 mL Intramuscular Tomorrow-1000 Salley Scarlet, MD       lamoTRIgine (LAMICTAL) tablet 300 mg  300 mg Oral Daily Salley Scarlet, MD       [START ON 10/18/2020] levothyroxine (SYNTHROID) tablet 75 mcg  75 mcg Oral Q0600 Salley Scarlet, MD       lubiprostone (AMITIZA) capsule 24 mcg  24 mcg Oral Azucena Kuba, MD       magnesium hydroxide (MILK OF MAGNESIA) suspension 30 mL  30 mL Oral Daily PRN Sherlon Handing, NP       mirabegron ER  (MYRBETRIQ) tablet 25 mg  25 mg Oral QHS Salley Scarlet, MD       nicotine (NICODERM CQ - dosed in mg/24 hours) patch 14 mg  14 mg Transdermal Q0600 Salley Scarlet, MD   14 mg at 10/17/20 0636   risperiDONE (RISPERDAL) tablet 0.5 mg  0.5 mg Oral QHS Caroline Sauger, NP   0.5 mg at 10/17/20 0132   PTA Medications: Medications Prior to Admission  Medication Sig Dispense Refill Last Dose   albuterol (VENTOLIN HFA) 108 (90 Base) MCG/ACT inhaler Inhale 1-2 puffs into the lungs every 4 (four) hours as needed for wheezing or shortness of breath. 1 Inhaler 1    amphetamine-dextroamphetamine (ADDERALL) 10 MG tablet Take 10 mg by mouth 2 (two) times daily.      atorvastatin (LIPITOR) 20 MG tablet Take 1 tablet (20 mg total) by mouth at bedtime. (Patient not taking: Reported on 10/16/2020) 30 tablet 1    Buprenorphine HCl-Naloxone HCl 8-2 MG FILM Take 0.5 Film by mouth 6 (six) times daily.      cariprazine (VRAYLAR) 3 MG capsule Take 1 capsule (3 mg total) by mouth daily. (Patient not taking: Reported on 10/16/2020) 30 capsule 1  cetirizine (ZYRTEC) 10 MG tablet Take 10 mg by mouth daily.      DULoxetine (CYMBALTA) 60 MG capsule Take 1 capsule (60 mg total) by mouth daily. 30 capsule 1    famotidine (PEPCID) 20 MG tablet TAKE 1 TABLET BY MOUTH AT BEDTIME. (Patient taking differently: Take 20 mg by mouth 2 (two) times daily.) 30 tablet 1    ferrous sulfate 324 MG TBEC Take 648 mg by mouth daily with breakfast.      folic acid (FOLVITE) 1 MG tablet Take 1 mg by mouth daily.      furosemide (LASIX) 20 MG tablet Take 20 mg by mouth daily as needed for fluid or edema.      gabapentin (NEURONTIN) 300 MG capsule Take 1 capsule (300 mg total) by mouth 4 (four) times daily -  with meals and at bedtime. (Patient not taking: Reported on 10/16/2020) 120 capsule 1    gabapentin (NEURONTIN) 600 MG tablet Take 300 mg by mouth in the morning, at noon, in the evening, and at bedtime.      lamoTRIgine (LAMICTAL)  150 MG tablet Take 2 tablets (300 mg total) by mouth daily. 60 tablet 1    levothyroxine (SYNTHROID) 75 MCG tablet Take 75 mcg by mouth daily before breakfast.      losartan (COZAAR) 100 MG tablet Take 100 mg by mouth daily.      lubiprostone (AMITIZA) 24 MCG capsule Take 24 mcg by mouth every other day.      meloxicam (MOBIC) 15 MG tablet Take 15 mg by mouth daily.      methotrexate (RHEUMATREX) 2.5 MG tablet Take 20 mg by mouth every Friday.      MYRBETRIQ 25 MG TB24 tablet Take 25 mg by mouth at bedtime.      omeprazole (PRILOSEC) 40 MG capsule Take 40 mg by mouth daily.      ondansetron (ZOFRAN ODT) 4 MG disintegrating tablet Take 1 tablet (4 mg total) by mouth every 8 (eight) hours as needed for nausea or vomiting. 20 tablet 0    Potassium 99 MG TABS Take 6 tablets by mouth daily.      SYMBICORT 160-4.5 MCG/ACT inhaler Inhale 2 puffs into the lungs 2 (two) times daily.       TALTZ 80 MG/ML SOAJ Inject 80 mg into the skin every 30 (thirty) days.      topiramate (TOPAMAX) 50 MG tablet Take 150 mg by mouth daily.       Patient Stressors: Health problems   Substance abuse    Patient Strengths: Capable of independent living  Motivation for treatment/growth   Treatment Modalities: Medication Management, Group therapy, Case management,  1 to 1 session with clinician, Psychoeducation, Recreational therapy.   Physician Treatment Plan for Primary Diagnosis: <principal problem not specified> Long Term Goal(s):     Short Term Goals:    Medication Management: Evaluate patient's response, side effects, and tolerance of medication regimen.  Therapeutic Interventions: 1 to 1 sessions, Unit Group sessions and Medication administration.  Evaluation of Outcomes: Not Met  Physician Treatment Plan for Secondary Diagnosis: Active Problems:   Bipolar disorder with depression ([place])  Long Term Goal(s):     Short Term Goals:       Medication Management: Evaluate patient's response, side  effects, and tolerance of medication regimen.  Therapeutic Interventions: 1 to 1 sessions, Unit Group sessions and Medication administration.  Evaluation of Outcomes: Not Met   RN Treatment Plan for Primary Diagnosis: <principal problem not specified>  Long Term Goal(s): Knowledge of disease and therapeutic regimen to maintain health will improve  Short Term Goals: Ability to remain free from injury will improve, Ability to verbalize frustration and anger appropriately will improve, Ability to demonstrate self-control, Ability to participate in decision making will improve, Ability to verbalize feelings will improve, Ability to disclose and discuss suicidal ideas, Ability to identify and develop effective coping behaviors will improve, and Compliance with prescribed medications will improve  Medication Management: RN will administer medications as ordered by provider, will assess and evaluate patient's response and provide education to patient for prescribed medication. RN will report any adverse and/or side effects to prescribing provider.  Therapeutic Interventions: 1 on 1 counseling sessions, Psychoeducation, Medication administration, Evaluate responses to treatment, Monitor vital signs and CBGs as ordered, Perform/monitor CIWA, COWS, AIMS and Fall Risk screenings as ordered, Perform wound care treatments as ordered.  Evaluation of Outcomes: Not Met   LCSW Treatment Plan for Primary Diagnosis: <principal problem not specified> Long Term Goal(s): Safe transition to appropriate next level of care at discharge, Engage patient in therapeutic group addressing interpersonal concerns.  Short Term Goals: Engage patient in aftercare planning with referrals and resources, Increase social support, Increase ability to appropriately verbalize feelings, Increase emotional regulation, Facilitate acceptance of mental health diagnosis and concerns, Facilitate patient progression through stages of change  regarding substance use diagnoses and concerns, Identify triggers associated with mental health/substance abuse issues, and Increase skills for wellness and recovery  Therapeutic Interventions: Assess for all discharge needs, 1 to 1 time with Social worker, Explore available resources and support systems, Assess for adequacy in community support network, Educate family and significant other(s) on suicide prevention, Complete Psychosocial Assessment, Interpersonal group therapy.  Evaluation of Outcomes: Not Met   Progress in Treatment: Attending groups: No. Participating in groups: No. Taking medication as prescribed: Yes. Toleration medication: Yes. Family/Significant other contact made: No, will contact:  CSW will obtain consent to reach collateral.  Patient understands diagnosis: No. Discussing patient identified problems/goals with staff: Yes. Medical problems stabilized or resolved: Yes. Denies suicidal/homicidal ideation: Yes. Issues/concerns per patient self-inventory: Yes. Other: none   New problem(s) identified: Yes, Describe:  Patient continues to exhibit paranoid speech, states "serious stuff is going on down stairs, they are torturing me . . . They steal expensive goods."   New Short Term/Long Term Goal(s): Work towards detox, elimination of symptoms of psychosis, medication management for mood stabilization; elimination of SI thoughts; development of comprehensive mental wellness/sobriety plan.   Patient Goals:  Patient would like to stay with [facility] CST team, however, they believe she needs a higher level of care.   Discharge Plan or Barriers:None identified at this time.   Reason for Continuation of Hospitalization: Hallucinations Other; describe Paranoia   Estimated Length of Stay: 1-7 days   Scribe for Treatment Team: Larose Kells 10/17/2020 9:35 AM

## 2020-10-17 NOTE — Group Note (Signed)
[facility] LCSW Group Therapy Note   Group Date: 10/17/2020 Start Time: 1300 End Time: 1400   Type of Therapy/Topic:  Group Therapy:  Emotion Regulation  Participation Level:    Did Not Attend   Description of Group:    The purpose of this group is to assist patients in learning to regulate negative emotions and experience positive emotions. Patients will be guided to discuss ways in which they have been vulnerable to their negative emotions. These vulnerabilities will be juxtaposed with experiences of positive emotions or situations, and patients challenged to use positive emotions to combat negative ones. Special emphasis will be placed on coping with negative emotions in conflict situations, and patients will process healthy conflict resolution skills.  Therapeutic Goals: Patient will identify two positive emotions or experiences to reflect on in order to balance out negative emotions:  Patient will label two or more emotions that they find the most difficult to experience:  Patient will be able to demonstrate positive conflict resolution skills through discussion or role plays:   Summary of Patient Progress:  X    Therapeutic Modalities:   Cognitive Behavioral Therapy Feelings Identification Dialectical Behavioral Therapy   Kirsten Richardson A Mija Effertz, LCSWA 

## 2020-10-17 NOTE — Plan of Care (Signed)
Patient talks disorganized and delusional. Stated that her sister knows what is going on here that's why she told me I got Haldol injection.Patient stated that she is withdrawing from Suboxone but after the Suboxone  the symptoms subsided. Patient leave the door opened states " I am scared of him.He can get here from anywhere."  Patient states that " he " is my sister's husband. Denies  SI,HI and VH at this time. Patient states " I hear some music all the time. Its not bothering me. "  PRN medication given for ankle pain. Patient walks with and without walker. Appetite and energy level good. ADLs maintained. Support and encouragement given.

## 2020-10-17 NOTE — [facility] Suicide Risk Assessment (Signed)
[facility] Admission Suicide Risk Assessment   Nursing information obtained from:    Demographic factors:  Caucasian, Low socioeconomic status, Unemployed Current Mental Status:  NA Loss Factors:  Decline in physical health, Financial problems / change in socioeconomic status Historical Factors:  Impulsivity Risk Reduction Factors:  Religious beliefs about death  Total Time spent with patient: 35 minutes- 25 minutes face-to-face contact with patient, 10 minutes documentation, coordination of care, scripts  Principal Problem: Bipolar disorder with depression ([facility]) Diagnosis:  Principal Problem:   Bipolar disorder with depression ([facility]) Active Problems:   Hypothyroidism due to acquired atrophy of thyroid   Essential hypertension   COPD (chronic obstructive pulmonary disease) ([facility])   Constipation  Subjective Data: 45 year old female presenting with mixed episode of Bipolar I disorder with psychotic features. Patient with rapid, pressured speech, tangential thoughts, suicidal ideations, and homicidal ideations. She reports that Leafy Kindle was not working well for her, and needs new medications. She is also convinced that her sister placed microchips in her head, and that her sister's boyfriend is a Systems analyst. She feels her sister is in the walls and hears everyone speaking. She notes that she wants a medication for bipolar disorder that will not sedate her. Previously had involuntary movements with Abilify and Latuda. She is agreeable to starting Geodon. On exam, her left great toe is noted to be erythematous and swollen. She states she has an infection, and is supposed to be on antibiotics. Per chart review, was given Keflex. Will reorder now.   Continued Clinical Symptoms:  Alcohol Use Disorder Identification Test Final Score (AUDIT): 0 The "Alcohol Use Disorders Identification Test", Guidelines for Use in Primary Care, Second Edition.  World Science writer [facility]). Score between 0-7:  no or low risk or  alcohol related problems. Score between 8-15:  moderate risk of alcohol related problems. Score between 16-19:  high risk of alcohol related problems. Score 20 or above:  warrants further diagnostic evaluation for alcohol dependence and treatment.   CLINICAL FACTORS:   Severe Anxiety and/or Agitation Bipolar Disorder:   Mixed State More than one psychiatric diagnosis Currently Psychotic Unstable or Poor Therapeutic Relationship Previous Psychiatric Diagnoses and Treatments Medical Diagnoses and Treatments/Surgeries   Musculoskeletal: Strength & Muscle Tone: within normal limits Gait & Station: normal Patient leans: N/A  Psychiatric Specialty Exam:  Presentation  General Appearance: Appropriate for Environment  Eye Contact:Fair  Speech:Pressured  Speech Volume:Increased  Handedness:Right   Mood and Affect  Mood:Anxious; Irritable  Affect:Congruent   Thought Process  Thought Processes:Disorganized  Descriptions of Associations:Tangential  Orientation:Full (Time, Place and Person)  Thought Content:Tangential; Scattered; Delusions  History of Schizophrenia/Schizoaffective disorder:No  Duration of Psychotic Symptoms:Greater than six months  Hallucinations:Hallucinations: None  Ideas of Reference:Paranoia; Delusions  Suicidal Thoughts:Suicidal Thoughts: Yes, Passive SI Passive Intent and/or Plan: Without Intent; Without Plan  Homicidal Thoughts:Homicidal Thoughts: No   Sensorium  Memory:Immediate Fair; Recent Fair; Remote Fair  Judgment:Impaired  Insight:Shallow   Executive Functions  Concentration:Poor  Attention Span:Poor  Recall:Fair  Fund of Knowledge:Fair  Language:Fair   Psychomotor Activity  Psychomotor Activity:Psychomotor Activity: Restlessness   Assets  Assets:Desire for Improvement; Financial Resources/Insurance; Housing; Resilience   Sleep  Sleep:Sleep: Fair Number of Hours of Sleep: 6.75    Physical  Exam: Physical Exam ROS Blood pressure 109/60, pulse 99, temperature 98.9 F (37.2 C), temperature source Oral, resp. rate 16, height 5' (1.524 m), weight 74.8 kg, SpO2 94 %. Body mass index is 32.21 kg/m.   COGNITIVE FEATURES THAT CONTRIBUTE TO RISK:  Loss of executive function and Polarized thinking    SUICIDE RISK:   Moderate:  Frequent suicidal ideation with limited intensity, and duration, some specificity in terms of plans, no associated intent, good self-control, limited dysphoria/symptomatology, some risk factors present, and identifiable protective factors, including available and accessible social support.  PLAN OF CARE: Continue inpatient admission, see H&P for details.   I certify that inpatient services furnished can reasonably be expected to improve the patient's condition.   Jesse Sans, MD 10/17/2020, 10:40 AM

## 2020-10-17 NOTE — H&P (Signed)
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 checks  Laboratory:   TSH, iron levels, potassium, lipid panel  Psychotherapy:    Medications:    Consultations:    Discharge Concerns:    Estimated LOS:  Other:     Physician Treatment Plan for Primary Diagnosis: Bipolar disorder with depression (HCC) Long Term Goal(s): Improvement in symptoms so as ready for discharge  Short Term Goals: Ability to identify changes in lifestyle to reduce recurrence of condition will improve, Ability to verbalize feelings  will improve, Ability to disclose and discuss suicidal ideas, Ability to demonstrate self-control will improve, Ability to identify and develop effective coping behaviors will improve, Ability to maintain clinical  measurements within normal limits will improve, Compliance with prescribed medications will improve, and Ability to identify triggers associated with substance abuse/mental health issues will improve  Physician Treatment Plan for Secondary Diagnosis: Principal Problem:   Bipolar disorder with depression (HCC) Active Problems:   Hypothyroidism due to acquired atrophy of thyroid   Essential hypertension   COPD (chronic obstructive pulmonary disease) (HCC)   Constipation  Long Term Goal(s): Improvement in symptoms so as ready for discharge  Short Term Goals: Ability to identify changes in lifestyle to reduce recurrence of condition will improve, Ability to verbalize feelings will improve, Ability to disclose and discuss suicidal ideas, Ability to demonstrate self-control will improve, Ability to identify and develop effective coping behaviors will improve, Ability to maintain clinical measurements within normal limits will improve, Compliance with prescribed medications will improve, and Ability to identify triggers associated with substance abuse/mental health issues will improve  I certify that inpatient services furnished can reasonably be expected to improve the patient's condition.    Jesse Sans, MD 10/12/202210:28 AM

## 2020-10-17 NOTE — Progress Notes (Signed)
Recreation Therapy Notes   Date: 10/17/2020  Time: 9:50 am   Location: Courtyard   Behavioral response: N/A   Intervention Topic: Leisure  Discussion/Intervention: Patient did not attend group.   Clinical Observations/Feedback:  Patient did not attend group.   Annica Marinello LRT/CTRS        [place] Nordquist 10/17/2020 11:08 AM

## 2020-10-17 NOTE — [facility] Suicide Risk Assessment (Signed)
[facility] INPATIENT:  Family/Significant Other Suicide Prevention Education  Suicide Prevention Education:  Patient Refusal for Family/Significant Other Suicide Prevention Education: The patient Kirsten Richardson has refused to provide written consent for family/significant other to be provided Family/Significant Other Suicide Prevention Education during admission and/or prior to discharge.  Physician notified.  Corky Crafts 10/17/2020, 6:23 PM

## 2020-10-17 NOTE — Progress Notes (Signed)
Patient woke up and stated she was hungry and thirsty. Pt given her dinner tray and water and juice. Patient presents with paranoia stating they gave her shots upstairs before she came down here so they could take a tracking chip out of her head. Pt given education, support, and encouragement. Pt remains safe on the unit.

## 2020-10-18 LAB — LIPID PANEL
Cholesterol: 139 mg/dL (ref 0–200)
HDL: 34 mg/dL — ABNORMAL LOW (ref >40)
LDL Cholesterol: 83 mg/dL (ref 0–99)
Total CHOL/HDL Ratio: 4.1 RATIO
Triglycerides: 110 mg/dL (ref <150)
VLDL: 22 mg/dL (ref 0–40)

## 2020-10-18 LAB — IRON AND TIBC
Iron: 69 ug/dL (ref 28–170)
Saturation Ratios: 32 % — ABNORMAL HIGH (ref 10.4–31.8)
TIBC: 216 ug/dL — ABNORMAL LOW (ref 250–450)
UIBC: 147 ug/dL

## 2020-10-18 LAB — POTASSIUM: Potassium: 3.6 mmol/L (ref 3.5–5.1)

## 2020-10-18 MED ORDER — ZIPRASIDONE HCL 20 MG PO CAPS
20.0000 mg | ORAL_CAPSULE | Freq: Two times a day (BID) | ORAL | Status: DC
  Administered 2020-10-18 – 2020-10-20 (×4): 20 mg via ORAL
  Filled 2020-10-18 (×5): qty 1

## 2020-10-18 MED ORDER — POTASSIUM CHLORIDE CRYS ER 20 MEQ PO TBCR
40.0000 meq | EXTENDED_RELEASE_TABLET | Freq: Once | ORAL | Status: AC
  Administered 2020-10-18: 40 meq via ORAL
  Filled 2020-10-18: qty 2

## 2020-10-18 NOTE — Progress Notes (Signed)
D: Pt alert and oriented. Pt rates depression 9/10, hopelessness 8/10, and anxiety 8/10. Pt goal: "Talking to [place]." Pt reports energy level as low and concentration as being poor. Pt reports sleep last night as being good. Pt did not receive medications for sleep. Pt reports experiencing 8/10 Left ankle pain. Pt denies experiencing any SI/HI, or AVH at this time.   Pt is calm and cooperative. Pt is observed coming out and eating lunch and dinner. Pt shares she has chronic fatigue and that is why she is so tired and stays in the bed so much.  A: Scheduled medications administered to pt, per MD orders. Support and encouragement provided. Frequent verbal contact made. Routine safety checks conducted q15 minutes.   R: No adverse drug reactions noted. Pt verbally contracts for safety at this time. Pt complaint with medications. Pt interacts minimally with others on the unit. Pt remains safe at this time. Will continue to monitor.

## 2020-10-18 NOTE — Progress Notes (Signed)
Recreation Therapy Notes  Date: 10/18/2020  Time: 10:30 am   Location: Craft room    Behavioral response: N/A   Intervention Topic: Decision Making   Discussion/Intervention: Patient did not attend group.   Clinical Observations/Feedback:  Patient did not attend group.   Kirsten Richardson          [name] 10/18/2020 11:12 AM

## 2020-10-18 NOTE — Progress Notes (Signed)
Recreation Therapy Notes  INPATIENT RECREATION THERAPY ASSESSMENT  Patient Details Name: Kirsten Richardson MRN: [id number] DOB: Mar 15, 1975 Today's Date: 10/18/2020       Information Obtained From: Patient  Able to Participate in Assessment/Interview: Yes  Patient Presentation: Responsive  Reason for Admission (Per Patient): Active Symptoms, Med Non-Compliance  Patient Stressors: Family  Coping Skills:   Prayer  Leisure Interests (2+):  Individual - Reading (Pray)  Frequency of Recreation/Participation: Weekly  Awareness of Community Resources:  Yes  Community Resources:  Other (Comment) ([facility])  Current Use: Yes  If no, Barriers?:    Expressed Interest in [employer] Information: Yes  County of Residence:  [place]  Patient Main Form of Transportation: Car  Patient Strengths:  My faith  Patient Identified Areas of Improvement:  Get medication right  Patient Goal for Hospitalization:  Gain coping skills  Current SI (including self-harm):  No  Current HI:  No  Current AVH: No  Staff Intervention Plan: Group Attendance, Collaborate with Interdisciplinary Treatment Team  Consent to Intern Participation: N/A  [name] 10/18/2020, 12:28 PM
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
## 2020-10-18 NOTE — Progress Notes (Signed)
Pt was in bed most of this shift. Pt stated left ankle was in pain 10/10. Pt denies SI. HI and AVH at this time. Pt remains safe on the unit at this time.

## 2020-10-18 NOTE — Progress Notes (Signed)
Recreation Therapy Notes  INPATIENT RECREATION TR PLAN  Patient Details Name: Kirsten Richardson MRN: [id number] DOB: 1975/08/15 Today's Date: 10/18/2020  Rec Therapy Plan Is patient appropriate for Therapeutic Recreation?: Yes Treatment times per week: at least 3 Estimated Length of Stay: 5-7 days TR Treatment/Interventions: Group participation (Comment)  Discharge Criteria Pt will be discharged from therapy if:: Discharged Treatment plan/goals/alternatives discussed and agreed upon by:: Patient/family  Discharge Summary     Alastair Hennes 10/18/2020, 12:28 PM

## 2020-10-18 NOTE — Group Note (Signed)

## 2020-10-19 LAB — THYROID PANEL WITH TSH
Free Thyroxine Index: 1.5 (ref 1.2–4.9)
T3 Uptake Ratio: 24 % (ref 24–39)
T4, Total: 6.3 ug/dL (ref 4.5–12.0)
TSH: 0.555 u[IU]/mL (ref 0.450–4.500)

## 2020-10-19 MED ORDER — ONDANSETRON HCL 4 MG PO TABS
8.0000 mg | ORAL_TABLET | Freq: Three times a day (TID) | ORAL | Status: DC | PRN
  Administered 2020-10-23 – 2020-10-29 (×5): 8 mg via ORAL
  Filled 2020-10-19 (×5): qty 2

## 2020-10-19 MED ORDER — PANTOPRAZOLE SODIUM 40 MG PO TBEC
40.0000 mg | DELAYED_RELEASE_TABLET | Freq: Every day | ORAL | Status: DC
  Administered 2020-10-19 – 2020-10-29 (×11): 40 mg via ORAL
  Filled 2020-10-19 (×11): qty 1

## 2020-10-19 MED ORDER — DICLOFENAC SODIUM 1 % EX GEL
2.0000 g | Freq: Four times a day (QID) | CUTANEOUS | Status: DC | PRN
  Administered 2020-10-24 – 2020-10-28 (×3): 2 g via TOPICAL
  Filled 2020-10-19: qty 100
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
## 2020-10-19 NOTE — Progress Notes (Signed)
Recreation Therapy Notes  Date: 10/19/2020  Time: 10:00 am   Location: Craft room    Behavioral response: N/A   Intervention Topic: Time Management   Discussion/Intervention: Patient did not attend group.   Clinical Observations/Feedback:  Patient did not attend group.   Aquilla Shambley LRT/CTRS        Henretter Piekarski 10/19/2020 11:29 AM
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
## 2020-10-19 NOTE — BHH Counselor (Signed)
CSW met with Kirsten Richardson, with [facility], [phone], who wanted to meet with the patient.  CSW obtained permission from psychiatrist for meeting.   [name] reports that she is with DSS to assist the patient with housing, transportation, and medication needs.   Housing: Pt reports that she can not return to her boarding house and does not wish to return.  CSW informed that she can provide housing list for boarding homes and/or shelter list.  DSS reports that they do not feel like the shelter would be a good option for the patient.  DSS reports that patient is with the transitional housing program with [facility].  She has no updates on this at this time and will provide CSW with the contact information for the patient's [place], Karle Barr.  Pt reports that the patient's [place] housing worker, Wells Guiles, will assist with paying the $591 due for October.  Medication: Patient requested that medication be placed in blister pack.  DSS reports that patient currently keeps medications in a box that is unorganized and has old expired medication in it as well.  [facility] and [facility] will deliver blister packs to the patient's home, which would be beneficial since pt has limited transportation access.   Transportation: DSS reports that [employer] may provide transportation.  CSW spoke with the patient about Medicaid transportation, however, DSS then stated that [employer] would provide needed transportation.  DSS acknowledged that the patients phone is broken and DSS will assist with the phone once patient is discharged.   Assunta Curtis, MSW, LCSW 10/19/2020 2:10 PM

## 2020-10-19 NOTE — Group Note (Signed)
LCSW Group Therapy Note   Group Date: 10/19/2020 Start Time: 1300 End Time: 1400   Type of Therapy and Topic:  Group Therapy: Boundaries  Participation Level:  Did Not Attend  Description of Group: This group will address the use of boundaries in their personal lives. Patients will explore why boundaries are important, the difference between healthy and unhealthy boundaries, and negative and postive outcomes of different boundaries and will look at how boundaries can be crossed.  Patients will be encouraged to identify current boundaries in their own lives and identify what kind of boundary is being set. Facilitators will guide patients in utilizing problem-solving interventions to address and correct types boundaries being used and to address when no boundary is being used. Understanding and applying boundaries will be explored and addressed for obtaining and maintaining a balanced life. Patients will be encouraged to explore ways to assertively make their boundaries and needs known to significant others in their lives, using other group members and facilitator for role play, support, and feedback.  Therapeutic Goals:  1.  Patient will identify areas in their life where setting clear boundaries could be  used to improve their life.  2.  Patient will identify signs/triggers that a boundary is not being respected. 3.  Patient will identify two ways to set boundaries in order to achieve balance in  their lives: 4.  Patient will demonstrate ability to communicate their needs and set boundaries  through discussion and/or role plays  Summary of Patient Progress:   Patient did not attend group despite encouraged participation.   Therapeutic Modalities:   Cognitive Behavioral Therapy Solution-Focused Therapy  Zuhayr Deeney W Dionte Blaustein, LCSWA 10/19/2020  4:16 PM    

## 2020-10-19 NOTE — Progress Notes (Signed)
D: Patient is in bed sleeping. Pt denies SI, HI, and AVH at this time. Pt complained of pain in left ankle.   A: Administered medications as per MAR orders.   R: Pt is medication compliant. Pt is calm and pleasant. Pt remains safe on the unit at this time.   10/18/20 2200  Psych Admission Type (Psych Patients Only)  Admission Status Involuntary  Psychosocial Assessment  Patient Complaints None  Eye Contact Fair  Facial Expression Flat  Affect Flat  Speech Soft  Interaction Forwards little  Motor Activity Slow  Appearance/Hygiene Unremarkable  Behavior Characteristics Cooperative;Appropriate to situation  Mood Pleasant  Aggressive Behavior  Effect No apparent injury  Thought Process  Coherency WDL  Content WDL  Delusions None reported or observed  Perception WDL  Hallucination None reported or observed  Judgment Poor  Confusion WDL  Danger to Self  Current suicidal ideation? Denies  Self-Injurious Behavior No self-injurious ideation or behavior indicators observed or expressed   Agreement Not to Harm Self Yes  Description of Agreement verbal  Danger to Others  Danger to Others None reported or observed

## 2020-10-19 NOTE — Progress Notes (Signed)
Awake in bed with lights on. Walker at bedside. Pleasant and cooperative at this time. Denies SI/HI/AVH. Refused night time snack. Remains safe on the unit with q15 minute safety checks.

## 2020-10-20 MED ORDER — ZIPRASIDONE HCL 40 MG PO CAPS
40.0000 mg | ORAL_CAPSULE | Freq: Every evening | ORAL | Status: DC
  Administered 2020-10-20: 40 mg via ORAL
  Filled 2020-10-20: qty 1

## 2020-10-20 MED ORDER — ZIPRASIDONE HCL 20 MG PO CAPS: 20.0000 mg | ORAL_CAPSULE | Freq: Two times a day (BID) | ORAL | Status: DC
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
## 2020-10-20 NOTE — Plan of Care (Signed)
Pt rates depression 8/10, hopelessness 7/10 and anxiety 9/10. Pt denies SI, HI and AVH. Pt was educated on care plan and verbalizes understanding. Torrie Mayers RN  Problem: Activity: Goal: Will verbalize the importance of balancing activity with adequate rest periods 10/20/2020 0837 by Chalmers Cater, RN Outcome: Not Progressing 10/20/2020 0837 by Chalmers Cater, RN Outcome: Progressing   Problem: Education: Goal: Will be free of psychotic symptoms 10/20/2020 0837 by Chalmers Cater, RN Outcome: Progressing 10/20/2020 0837 by Chalmers Cater, RN Outcome: Progressing Goal: Knowledge of the prescribed therapeutic regimen will improve 10/20/2020 0837 by Chalmers Cater, RN Outcome: Progressing 10/20/2020 0837 by Chalmers Cater, RN Outcome: Progressing   Problem: Coping: Goal: Coping ability will improve 10/20/2020 0837 by Chalmers Cater, RN Outcome: Progressing 10/20/2020 0837 by Chalmers Cater, RN Outcome: Progressing Goal: Will verbalize feelings 10/20/2020 0837 by Chalmers Cater, RN Outcome: Progressing 10/20/2020 0837 by Chalmers Cater, RN Outcome: Progressing   Problem: Health Behavior/Discharge Planning: Goal: Compliance with prescribed medication regimen will improve 10/20/2020 0837 by Chalmers Cater, RN Outcome: Progressing 10/20/2020 0837 by Chalmers Cater, RN Outcome: Progressing   Problem: Nutritional: Goal: Ability to achieve adequate nutritional intake will improve 10/20/2020 0837 by Chalmers Cater, RN Outcome: Not Progressing 10/20/2020 0837 by Chalmers Cater, RN Outcome: Progressing   Problem: Role Relationship: Goal: Ability to communicate needs accurately will improve 10/20/2020 0837 by Chalmers Cater, RN Outcome: Progressing 10/20/2020 0837 by Chalmers Cater, RN Outcome: Progressing Goal: Ability to interact with others will improve 10/20/2020 0837 by Chalmers Cater, RN Outcome: Progressing 10/20/2020 0837 by Chalmers Cater,  RN Outcome: Progressing   Problem: Safety: Goal: Ability to redirect hostility and anger into socially appropriate behaviors will improve 10/20/2020 0837 by Chalmers Cater, RN Outcome: Progressing 10/20/2020 0837 by Chalmers Cater, RN Outcome: Progressing Goal: Ability to remain free from injury will improve 10/20/2020 0837 by Chalmers Cater, RN Outcome: Progressing 10/20/2020 0837 by Chalmers Cater, RN Outcome: Progressing   Problem: Self-Care: Goal: Ability to participate in self-care as condition permits will improve 10/20/2020 0837 by Chalmers Cater, RN Outcome: Progressing 10/20/2020 0837 by Chalmers Cater, RN Outcome: Progressing   Problem: Self-Concept: Goal: Will verbalize positive feelings about self 10/20/2020 0837 by Chalmers Cater, RN Outcome: Progressing 10/20/2020 0837 by Chalmers Cater, RN Outcome: Progressing   Problem: Education: Goal: Knowledge of General Education information will improve Description: Including pain rating scale, medication(s)/side effects and non-pharmacologic comfort measures 10/20/2020 0837 by Chalmers Cater, RN Outcome: Progressing 10/20/2020 0837 by Chalmers Cater, RN Outcome: Progressing   Problem: Health Behavior/Discharge Planning: Goal: Ability to manage health-related needs will improve 10/20/2020 0837 by Chalmers Cater, RN Outcome: Progressing 10/20/2020 0837 by Chalmers Cater, RN Outcome: Progressing   Problem: Clinical Measurements: Goal: Ability to maintain clinical measurements within normal limits will improve Outcome: Progressing Goal: Will remain free from infection Outcome: Progressing Goal: Diagnostic test results will improve Outcome: Progressing Goal: Respiratory complications will improve Outcome: Progressing Goal: Cardiovascular complication will be avoided Outcome: Progressing   Problem: Activity: Goal: Risk for activity intolerance will decrease 10/20/2020 0837 by Chalmers Cater,  RN Outcome: Progressing 10/20/2020 0837 by Chalmers Cater, RN Outcome: Progressing   Problem: Nutrition: Goal: Adequate nutrition will be maintained 10/20/2020 0837 by Chalmers Cater, RN Outcome: Not Progressing 10/20/2020 0837 by Chalmers Cater, RN Outcome: Progressing   Problem: Coping: Goal: Level of  anxiety will decrease 10/20/2020 0837 by Chalmers Cater, RN Outcome: Not Progressing 10/20/2020 0837 by Chalmers Cater, RN Outcome: Progressing   Problem: Elimination: Goal: Will not experience complications related to bowel motility 10/20/2020 0837 by Chalmers Cater, RN Outcome: Progressing 10/20/2020 0837 by Chalmers Cater, RN Outcome: Progressing Goal: Will not experience complications related to urinary retention 10/20/2020 0837 by Chalmers Cater, RN Outcome: Progressing 10/20/2020 0837 by Chalmers Cater, RN Outcome: Progressing   Problem: Pain Managment: Goal: General experience of comfort will improve 10/20/2020 0837 by Chalmers Cater, RN Outcome: Progressing 10/20/2020 0837 by Chalmers Cater, RN Outcome: Progressing   Problem: Safety: Goal: Ability to remain free from injury will improve 10/20/2020 0837 by Chalmers Cater, RN Outcome: Progressing 10/20/2020 0837 by Chalmers Cater, RN Outcome: Progressing   Problem: Skin Integrity: Goal: Risk for impaired skin integrity will decrease 10/20/2020 0837 by Chalmers Cater, RN Outcome: Progressing 10/20/2020 0837 by Chalmers Cater, RN Outcome: Progressing   Problem: Education: Goal: Ability to state activities that reduce stress will improve 10/20/2020 0837 by Chalmers Cater, RN Outcome: Progressing 10/20/2020 0837 by Chalmers Cater, RN Outcome: Progressing   Problem: Coping: Goal: Ability to identify and develop effective coping behavior will improve 10/20/2020 0837 by Chalmers Cater, RN Outcome: Progressing 10/20/2020 0837 by Chalmers Cater, RN Outcome: Progressing   Problem:  Self-Concept: Goal: Ability to identify factors that promote anxiety will improve 10/20/2020 0837 by Chalmers Cater, RN Outcome: Progressing 10/20/2020 0837 by Chalmers Cater, RN Outcome: Progressing Goal: Level of anxiety will decrease 10/20/2020 0837 by Chalmers Cater, RN Outcome: Progressing 10/20/2020 0837 by Chalmers Cater, RN Outcome: Progressing Goal: Ability to modify response to factors that promote anxiety will improve 10/20/2020 0837 by Chalmers Cater, RN Outcome: Progressing 10/20/2020 0837 by Chalmers Cater, RN Outcome: Progressing

## 2020-10-20 NOTE — Progress Notes (Signed)
Ambulates to medication room with walker for scheduled medications. Client is pleasant and cooperative at this time; complains of moderate lower back pain (see MAR). Denies SI/HI/AVH. Remains safe on the unit with q15 minute safety checks.

## 2020-10-20 NOTE — Progress Notes (Signed)
Pt mainly slept today, decreased appetite.  Pt has a blunt affect. Pt complains of being tired and and she states that she has not had a change in her mood this evening compared to this morning. Torrie Mayers RN

## 2020-10-20 NOTE — Group Note (Signed)
LCSW Group Therapy Note  Group Date: 10/20/2020 Start Time: 1300 End Time: 1400   Type of Therapy and Topic:  Group Therapy - Healthy vs Unhealthy Coping Skills  Participation Level:  Did Not Attend   Description of Group The focus of this group was to determine what unhealthy coping techniques typically are used by group members and what healthy coping techniques would be helpful in coping with various problems. Patients were guided in becoming aware of the differences between healthy and unhealthy coping techniques. Patients were asked to identify 2-3 healthy coping skills they would like to learn to use more effectively.  Therapeutic Goals Patients learned that coping is what human beings do all day long to deal with various situations in their lives Patients defined and discussed healthy vs unhealthy coping techniques Patients identified their preferred coping techniques and identified whether these were healthy or unhealthy Patients determined 2-3 healthy coping skills they would like to become more familiar with and use more often. Patients provided support and ideas to each other   Summary of Patient Progress:  Patient did not attend group despite encouraged participation.   Therapeutic Modalities Cognitive Behavioral Therapy Motivational Interviewing  Norberto Sorenson, Theresia Majors 10/20/2020  3:49 PM

## 2020-10-21 MED ORDER — RISPERIDONE 1 MG PO TBDP
0.5000 mg | ORAL_TABLET | Freq: Three times a day (TID) | ORAL | Status: DC | PRN
  Administered 2020-10-23: 0.5 mg via ORAL
  Filled 2020-10-21: qty 1

## 2020-10-21 MED ORDER — RISPERIDONE 1 MG PO TBDP
1.0000 mg | ORAL_TABLET | Freq: Every day | ORAL | Status: DC
  Administered 2020-10-22: 1 mg via ORAL
  Filled 2020-10-21: qty 1
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
## 2020-10-21 NOTE — Group Note (Signed)
[facility] LCSW Group Therapy Note   Group Date: 10/21/2020 Start Time: 1310 End Time: 1410   Type of Therapy and Topic: Group Therapy: Avoiding Self-Sabotaging and Enabling Behaviors  Participation Level: Did Not Attend  Mood:  Description of Group:  In this group, patients will learn how to identify obstacles, self-sabotaging and enabling behaviors, as well as: what are they, why do we do them and what needs these behaviors meet. Discuss unhealthy relationships and how to have positive healthy boundaries with those that sabotage and enable. Explore aspects of self-sabotage and enabling in yourself and how to limit these self-destructive behaviors in everyday life.   Therapeutic Goals: 1. Patient will identify one obstacle that relates to self-sabotage and enabling behaviors 2. Patient will identify one personal self-sabotaging or enabling behavior they did prior to admission 3. Patient will state a plan to change the above identified behavior 4. Patient will demonstrate ability to communicate their needs through discussion and/or role play.    Summary of Patient Progress: Patient did not attend group despite encouraged participation.     Therapeutic Modalities:  Cognitive Behavioral Therapy Person-Centered Therapy Motivational Interviewing    Dezaria Methot K Nima Kemppainen, LCSWA 

## 2020-10-21 NOTE — Progress Notes (Signed)
D: Pt alert and oriented. Pt rates depression 10/10, hopelessness 10/10, and anxiety 10/10. Pt goal: "Talk to someone about people staying out of my room in the mornings/nights." Pt reports energy level as low and concentration as being poor. Pt reports sleep last night as being fair. Pt did not receive medications for sleep. Pt reports 9/10 left ankle experiencing, prn meds given prior to shift change. Pt reports experiencing any SI/HI at this time and denies experiencing AVH at this time. Pt denies having a plan while here and contracts for safety. Pt shares HI is toward her sister and sister's boyfriend and does not share if she has a plan or any further details.  Pt refused morning dose of Geodon stating that she was not going to take it because she doesn't want to be tired all day. MD was notified and orders were placed.  This afternoon pt refused her antibiotic cephalexin. When asked why she did not want to take it pt reported that she didn't trust anyone and wasn't going to take anymore medication even if that means that she was going to stay here longer. When asked what changed from last week and why she didn't trust anyone pt stated she heard someone, a girl, talking on the phone. When asked what she heard she said I only remember bits and pieces but wasn't going to repeat it, that she didn't trust anyone. Pt showed medication still in package and educated on importance of medication. Pt looked at package and medication stated it doesn't look right and she's not going to take it. Pt followed that statement by saying she probably wasn't even going to eat anything here anymore either because she doesn't trust anyone. MD was notified and is aware, prn risperidone ordered and refused by pt as well.   After dinner scheduled meds offered and refused again. MD notified and aware.  A: Scheduled medications administered to pt, per MD orders. Support and encouragement provided. Frequent verbal contact made.  Routine safety checks conducted q15 minutes.   R: No adverse drug reactions noted. Pt verbally contracts for safety at this time. Pt non-complaint with medications and treatment plan. Pt interacts minimally with others on the unit, mostly self isolating to room. Pt remains safe at this time. Will continue to monitor.

## 2020-10-22 NOTE — Progress Notes (Signed)
Pt just took her meds from this morning except for two. She also took her antibiotic. She stated that she would take "whatever you give me so I can get out". Torrie Mayers RN

## 2020-10-22 NOTE — Progress Notes (Signed)
Pt is being more calm and cooperative. Pt anticipates discharge so is not compliant with medications. Pt has a worried sullen affect. Pt mainly stays withdrawn and sleps. Torrie Mayers RN

## 2020-10-22 NOTE — Plan of Care (Signed)
  Problem: Group Participation Goal: STG - Patient will engage in groups without prompting or encouragement from LRT x3 group sessions within 5 recreation therapy group sessions Description: STG - Patient will engage in groups without prompting or encouragement from LRT x3 group sessions within 5 recreation therapy group sessions Outcome: Not Progressing   

## 2020-10-22 NOTE — Plan of Care (Signed)
  Problem: Activity: Goal: Will verbalize the importance of balancing activity with adequate rest periods Outcome: Not Progressing   Problem: Education: Goal: Will be free of psychotic symptoms Outcome: Not Progressing Goal: Knowledge of the prescribed therapeutic regimen will improve Outcome: Not Progressing   Problem: Coping: Goal: Coping ability will improve Outcome: Not Progressing Goal: Will verbalize feelings Outcome: Not Progressing   Problem: Health Behavior/Discharge Planning: Goal: Compliance with prescribed medication regimen will improve Outcome: Not Progressing   Problem: Nutritional: Goal: Ability to achieve adequate nutritional intake will improve Outcome: Not Progressing   Problem: Role Relationship: Goal: Ability to communicate needs accurately will improve Outcome: Not Progressing Goal: Ability to interact with others will improve Outcome: Not Progressing   Problem: Safety: Goal: Ability to redirect hostility and anger into socially appropriate behaviors will improve Outcome: Not Progressing Goal: Ability to remain free from injury will improve Outcome: Not Progressing   Problem: Self-Care: Goal: Ability to participate in self-care as condition permits will improve Outcome: Not Progressing   Problem: Self-Concept: Goal: Will verbalize positive feelings about self Outcome: Not Progressing   Problem: Education: Goal: Knowledge of General Education information will improve Description: Including pain rating scale, medication(s)/side effects and non-pharmacologic comfort measures Outcome: Not Progressing   Problem: Health Behavior/Discharge Planning: Goal: Ability to manage health-related needs will improve Outcome: Not Progressing   Problem: Clinical Measurements: Goal: Ability to maintain clinical measurements within normal limits will improve Outcome: Not Progressing Goal: Will remain free from infection Outcome: Not Progressing Goal:  Diagnostic test results will improve Outcome: Not Progressing Goal: Respiratory complications will improve Outcome: Not Progressing Goal: Cardiovascular complication will be avoided Outcome: Not Progressing   Problem: Activity: Goal: Risk for activity intolerance will decrease Outcome: Not Progressing   Problem: Nutrition: Goal: Adequate nutrition will be maintained Outcome: Not Progressing   Problem: Coping: Goal: Level of anxiety will decrease Outcome: Not Progressing   Problem: Elimination: Goal: Will not experience complications related to bowel motility Outcome: Not Progressing Goal: Will not experience complications related to urinary retention Outcome: Not Progressing   Problem: Pain Managment: Goal: General experience of comfort will improve Outcome: Not Progressing   Problem: Safety: Goal: Ability to remain free from injury will improve Outcome: Not Progressing   Problem: Skin Integrity: Goal: Risk for impaired skin integrity will decrease Outcome: Not Progressing   Problem: Education: Goal: Ability to state activities that reduce stress will improve Outcome: Not Progressing   Problem: Coping: Goal: Ability to identify and develop effective coping behavior will improve Outcome: Not Progressing   Problem: Self-Concept: Goal: Ability to identify factors that promote anxiety will improve Outcome: Not Progressing Goal: Level of anxiety will decrease Outcome: Not Progressing Goal: Ability to modify response to factors that promote anxiety will improve Outcome: Not Progressing

## 2020-10-22 NOTE — Plan of Care (Signed)
  Problem: Activity: Goal: Will verbalize the importance of balancing activity with adequate rest periods Outcome: Progressing   Problem: Education: Goal: Will be free of psychotic symptoms Outcome: Progressing Goal: Knowledge of the prescribed therapeutic regimen will improve Outcome: Progressing   Problem: Coping: Goal: Coping ability will improve Outcome: Progressing Goal: Will verbalize feelings Outcome: Progressing   Problem: Health Behavior/Discharge Planning: Goal: Compliance with prescribed medication regimen will improve Outcome: Progressing   

## 2020-10-22 NOTE — Progress Notes (Signed)
Patient is calm and cooperative. She is med compliant and received her medication without incident. She denies si/hi/avh. She endorses anxiety, depression and mild pain in L ankle.  She is safe on the unit with Q15 minute safety rounds and encouraged to come to staff with any concerns.     Cleo Butler-Nicholson, LPN

## 2020-10-22 NOTE — [facility] Group Notes (Signed)
[facility] Group Notes:  (Nursing/MHT/Case Management/Adjunct)  Date:  10/22/2020  Time:  9:42 AM  Type of Therapy:   Community Meeting  Participation Level:  Did Not Attend   Lynelle [facility] 10/22/2020, 9:42 AM

## 2020-10-22 NOTE — Progress Notes (Signed)
Recreation Therapy Notes    Date: 10/22/2020   Time: 9:45 am   Location: Craft room     Behavioral response: N/A   Intervention Topic: Self-Care   Discussion/Intervention: Patient did not attend group.   Clinical Observations/Feedback:  Patient did not attend group.   Wilford Merryfield LRT/CTRS        Kirsten Richardson 10/22/2020 11:08 AM
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
## 2020-10-22 NOTE — Progress Notes (Signed)
Patient remains flat and depressed. Has refused all of her medications adamantly. Gives no reason for refusal. Denies SI, HI and AVH. Has spent most of shift either sitting on the chair in front of her room or in her room. Guarded and preoccupied

## 2020-10-22 NOTE — BH IP Treatment Plan (Signed)
Interdisciplinary Treatment and Diagnostic Plan Update  10/22/2020 Time of Session: 8:30AM  Kirsten Richardson MRN: [id number]  Principal Diagnosis: Bipolar disorder with depression [facility])  Secondary Diagnoses: Principal Problem:   Bipolar disorder with depression (HCC) Active Problems:   Hypothyroidism due to acquired atrophy of thyroid   Essential hypertension   COPD (chronic obstructive pulmonary disease) (HCC)   Constipation   Current Medications:  Current Facility-Administered Medications  Medication Dose Route Frequency Provider Last Rate Last Admin   acetaminophen (TYLENOL) tablet 650 mg  650 mg Oral Q6H PRN Vanetta Mulders, NP       albuterol (VENTOLIN HFA) 108 (90 Base) MCG/ACT inhaler 1-2 puff  1-2 puff Inhalation Q4H PRN Jesse Sans, MD       alum & mag hydroxide-simeth (MAALOX/MYLANTA) 200-200-20 MG/5ML suspension 30 mL  30 mL Oral Q4H PRN Gabriel Cirri F, NP       atorvastatin (LIPITOR) tablet 20 mg  20 mg Oral QHS Jesse Sans, MD   20 mg at 10/20/20 2237   cephALEXin (KEFLEX) capsule 500 mg  500 mg Oral Q8H Jesse Sans, MD   500 mg at 10/21/20 7829   diclofenac Sodium (VOLTAREN) 1 % topical gel 2 g  2 g Topical QID PRN Jesse Sans, MD       DULoxetine (CYMBALTA) DR capsule 60 mg  60 mg Oral Daily Jesse Sans, MD   60 mg at 10/21/20 0734   folic acid (FOLVITE) tablet 1 mg  1 mg Oral Daily Jesse Sans, MD   1 mg at 10/21/20 0734   ibuprofen (ADVIL) tablet 600 mg  600 mg Oral Q6H PRN Jesse Sans, MD   600 mg at 10/21/20 0735   influenza vac split quadrivalent PF (FLUARIX) injection 0.5 mL  0.5 mL Intramuscular Tomorrow-1000 Jesse Sans, MD       lamoTRIgine (LAMICTAL) tablet 300 mg  300 mg Oral Daily Jesse Sans, MD   300 mg at 10/21/20 0734   levothyroxine (SYNTHROID) tablet 75 mcg  75 mcg Oral Q0600 Jesse Sans, MD   75 mcg at 10/21/20 5621   lubiprostone (AMITIZA) capsule 24 mcg  24 mcg Oral Gretchen Portela, MD    24 mcg at 10/21/20 0949   magnesium hydroxide (MILK OF MAGNESIA) suspension 30 mL  30 mL Oral Daily PRN Vanetta Mulders, NP       mirabegron ER (MYRBETRIQ) tablet 25 mg  25 mg Oral QHS Jesse Sans, MD   25 mg at 10/20/20 2237   nicotine (NICODERM CQ - dosed in mg/24 hours) patch 14 mg  14 mg Transdermal Q0600 Jesse Sans, MD   14 mg at 10/21/20 0623   ondansetron (ZOFRAN) tablet 8 mg  8 mg Oral Q8H PRN Jesse Sans, MD       pantoprazole (PROTONIX) EC tablet 40 mg  40 mg Oral Daily Jesse Sans, MD   40 mg at 10/21/20 3086   risperiDONE (RISPERDAL M-TABS) disintegrating tablet 0.5 mg  0.5 mg Oral TID PRN He, Jun, MD       risperiDONE (RISPERDAL M-TABS) disintegrating tablet 1 mg  1 mg Oral QHS He, Jun, MD       PTA Medications: Medications Prior to Admission  Medication Sig Dispense Refill Last Dose   albuterol (VENTOLIN HFA) 108 (90 Base) MCG/ACT inhaler Inhale 1-2 puffs into the lungs every 4 (four) hours as needed for wheezing or shortness of breath.  1 Inhaler 1    amphetamine-dextroamphetamine (ADDERALL) 10 MG tablet Take 10 mg by mouth 2 (two) times daily.      atorvastatin (LIPITOR) 20 MG tablet Take 1 tablet (20 mg total) by mouth at bedtime. (Patient not taking: Reported on 10/16/2020) 30 tablet 1    Buprenorphine HCl-Naloxone HCl 8-2 MG FILM Take 0.5 Film by mouth 6 (six) times daily.      cariprazine (VRAYLAR) 3 MG capsule Take 1 capsule (3 mg total) by mouth daily. (Patient not taking: Reported on 10/16/2020) 30 capsule 1    cetirizine (ZYRTEC) 10 MG tablet Take 10 mg by mouth daily.      DULoxetine (CYMBALTA) 60 MG capsule Take 1 capsule (60 mg total) by mouth daily. 30 capsule 1    famotidine (PEPCID) 20 MG tablet TAKE 1 TABLET BY MOUTH AT BEDTIME. (Patient taking differently: Take 20 mg by mouth 2 (two) times daily.) 30 tablet 1    ferrous sulfate 324 MG TBEC Take 648 mg by mouth daily with breakfast.      folic acid (FOLVITE) 1 MG tablet Take 1 mg by mouth  daily.      furosemide (LASIX) 20 MG tablet Take 20 mg by mouth daily as needed for fluid or edema.      gabapentin (NEURONTIN) 300 MG capsule Take 1 capsule (300 mg total) by mouth 4 (four) times daily -  with meals and at bedtime. (Patient not taking: Reported on 10/16/2020) 120 capsule 1    gabapentin (NEURONTIN) 600 MG tablet Take 300 mg by mouth in the morning, at noon, in the evening, and at bedtime.      lamoTRIgine (LAMICTAL) 150 MG tablet Take 2 tablets (300 mg total) by mouth daily. 60 tablet 1    levothyroxine (SYNTHROID) 75 MCG tablet Take 75 mcg by mouth daily before breakfast.      losartan (COZAAR) 100 MG tablet Take 100 mg by mouth daily.      lubiprostone (AMITIZA) 24 MCG capsule Take 24 mcg by mouth every other day.      meloxicam (MOBIC) 15 MG tablet Take 15 mg by mouth daily.      methotrexate (RHEUMATREX) 2.5 MG tablet Take 20 mg by mouth every Friday.      MYRBETRIQ 25 MG TB24 tablet Take 25 mg by mouth at bedtime.      omeprazole (PRILOSEC) 40 MG capsule Take 40 mg by mouth daily.      ondansetron (ZOFRAN ODT) 4 MG disintegrating tablet Take 1 tablet (4 mg total) by mouth every 8 (eight) hours as needed for nausea or vomiting. 20 tablet 0    Potassium 99 MG TABS Take 6 tablets by mouth daily.      SYMBICORT 160-4.5 MCG/ACT inhaler Inhale 2 puffs into the lungs 2 (two) times daily.       TALTZ 80 MG/ML SOAJ Inject 80 mg into the skin every 30 (thirty) days.      topiramate (TOPAMAX) 50 MG tablet Take 150 mg by mouth daily.       Patient Stressors: Health problems   Substance abuse    Patient Strengths: Capable of independent living  Motivation for treatment/growth   Treatment Modalities: Medication Management, Group therapy, Case management,  1 to 1 session with clinician, Psychoeducation, Recreational therapy.   Physician Treatment Plan for Primary Diagnosis: Bipolar disorder with depression (HCC) Long Term Goal(s): Improvement in symptoms so as ready for  discharge   Short Term Goals: Ability to identify changes in  lifestyle to reduce recurrence of condition will improve Ability to verbalize feelings will improve Ability to disclose and discuss suicidal ideas Ability to demonstrate self-control will improve Ability to identify and develop effective coping behaviors will improve Ability to maintain clinical measurements within normal limits will improve Compliance with prescribed medications will improve Ability to identify triggers associated with substance abuse/mental health issues will improve  Medication Management: Evaluate patient's response, side effects, and tolerance of medication regimen.  Therapeutic Interventions: 1 to 1 sessions, Unit Group sessions and Medication administration.  Evaluation of Outcomes: Progressing  Physician Treatment Plan for Secondary Diagnosis: Principal Problem:   Bipolar disorder with depression (HCC) Active Problems:   Hypothyroidism due to acquired atrophy of thyroid   Essential hypertension   COPD (chronic obstructive pulmonary disease) (HCC)   Constipation  Long Term Goal(s): Improvement in symptoms so as ready for discharge   Short Term Goals: Ability to identify changes in lifestyle to reduce recurrence of condition will improve Ability to verbalize feelings will improve Ability to disclose and discuss suicidal ideas Ability to demonstrate self-control will improve Ability to identify and develop effective coping behaviors will improve Ability to maintain clinical measurements within normal limits will improve Compliance with prescribed medications will improve Ability to identify triggers associated with substance abuse/mental health issues will improve     Medication Management: Evaluate patient's response, side effects, and tolerance of medication regimen.  Therapeutic Interventions: 1 to 1 sessions, Unit Group sessions and Medication administration.  Evaluation of Outcomes:  Progressing   RN Treatment Plan for Primary Diagnosis: Bipolar disorder with depression (HCC) Long Term Goal(s): Knowledge of disease and therapeutic regimen to maintain health will improve  Short Term Goals: Ability to demonstrate self-control, Ability to participate in decision making will improve, Ability to verbalize feelings will improve, Ability to disclose and discuss suicidal ideas, Ability to identify and develop effective coping behaviors will improve, and Compliance with prescribed medications will improve  Medication Management: RN will administer medications as ordered by provider, will assess and evaluate patient's response and provide education to patient for prescribed medication. RN will report any adverse and/or side effects to prescribing provider.  Therapeutic Interventions: 1 on 1 counseling sessions, Psychoeducation, Medication administration, Evaluate responses to treatment, Monitor vital signs and CBGs as ordered, Perform/monitor CIWA, COWS, AIMS and Fall Risk screenings as ordered, Perform wound care treatments as ordered.  Evaluation of Outcomes: Progressing   LCSW Treatment Plan for Primary Diagnosis: Bipolar disorder with depression (HCC) Long Term Goal(s): Safe transition to appropriate next level of care at discharge, Engage patient in therapeutic group addressing interpersonal concerns.  Short Term Goals: Engage patient in aftercare planning with referrals and resources, Increase social support, Increase ability to appropriately verbalize feelings, Increase emotional regulation, Facilitate acceptance of mental health diagnosis and concerns, Facilitate patient progression through stages of change regarding substance use diagnoses and concerns, Identify triggers associated with mental health/substance abuse issues, and Increase skills for wellness and recovery  Therapeutic Interventions: Assess for all discharge needs, 1 to 1 time with Social worker, Explore available  resources and support systems, Assess for adequacy in community support network, Educate family and significant other(s) on suicide prevention, Complete Psychosocial Assessment, Interpersonal group therapy.  Evaluation of Outcomes: Progressing   Progress in Treatment: Attending groups: No. Participating in groups: No. Taking medication as prescribed: Yes. Toleration medication: Yes. Family/Significant other contact made: No, will contact:  once permission is given. Patient understands diagnosis: Yes. Discussing patient identified problems/goals with staff: Yes. Medical problems  stabilized or resolved: Yes. Denies suicidal/homicidal ideation: Yes. Issues/concerns per patient self-inventory: No. Other: noe  New problem(s) identified: No, Describe:  none Update 10/22/2020:   New Short Term/Long Term Goal(s): detox, elimination of symptoms of psychosis, medication management for mood stabilization; elimination of SI thoughts; development of comprehensive mental wellness/sobriety plan. Update 10/22/2020:  No changes at this time.   Patient Goals:  Patient would like to stay with [facility] CST team, however, they believe she needs a higher level of care. Update 10/22/2020: No changes at this time.    Discharge Plan or Barriers:None identified at this time.  Update 10/22/2020: Patient remains psychotic at this time.  She reports that she can not return to her home.  She reports that she is in need of transportation, a new phone and aftercare plans.  Patient has DSS involvement who is assisting with housing needs.    Reason for Continuation of Hospitalization: Hallucinations Other; describe Paranoia    Estimated Length of Stay: 1-7 days   Scribe for Treatment Team: [name], LCSW 10/22/2020 10:38 AM

## 2020-10-22 NOTE — Plan of Care (Signed)
Pt rates depression and anxiety 2/10. Pt denies SI, HI and AVH. Pt was educated on care plan and verbalizes understanding. Torrie Mayers RN Problem: Activity: Goal: Will verbalize the importance of balancing activity with adequate rest periods Outcome: Not Progressing   Problem: Education: Goal: Will be free of psychotic symptoms Outcome: Not Progressing Goal: Knowledge of the prescribed therapeutic regimen will improve Outcome: Not Progressing   Problem: Coping: Goal: Coping ability will improve Outcome: Not Progressing Goal: Will verbalize feelings Outcome: Not Progressing   Problem: Health Behavior/Discharge Planning: Goal: Compliance with prescribed medication regimen will improve Outcome: Not Progressing   Problem: Nutritional: Goal: Ability to achieve adequate nutritional intake will improve Outcome: Not Progressing   Problem: Role Relationship: Goal: Ability to communicate needs accurately will improve Outcome: Not Progressing Goal: Ability to interact with others will improve Outcome: Not Progressing   Problem: Safety: Goal: Ability to redirect hostility and anger into socially appropriate behaviors will improve Outcome: Not Progressing Goal: Ability to remain free from injury will improve Outcome: Not Progressing   Problem: Self-Care: Goal: Ability to participate in self-care as condition permits will improve Outcome: Not Progressing   Problem: Self-Concept: Goal: Will verbalize positive feelings about self Outcome: Not Progressing   Problem: Education: Goal: Knowledge of General Education information will improve Description: Including pain rating scale, medication(s)/side effects and non-pharmacologic comfort measures Outcome: Not Progressing   Problem: Health Behavior/Discharge Planning: Goal: Ability to manage health-related needs will improve Outcome: Not Progressing   Problem: Clinical Measurements: Goal: Ability to maintain clinical measurements  within normal limits will improve Outcome: Not Progressing Goal: Will remain free from infection Outcome: Not Progressing Goal: Diagnostic test results will improve Outcome: Not Progressing Goal: Respiratory complications will improve Outcome: Not Progressing Goal: Cardiovascular complication will be avoided Outcome: Not Progressing   Problem: Activity: Goal: Risk for activity intolerance will decrease Outcome: Not Progressing   Problem: Nutrition: Goal: Adequate nutrition will be maintained Outcome: Not Progressing   Problem: Coping: Goal: Level of anxiety will decrease Outcome: Not Progressing   Problem: Elimination: Goal: Will not experience complications related to bowel motility Outcome: Not Progressing Goal: Will not experience complications related to urinary retention Outcome: Not Progressing   Problem: Pain Managment: Goal: General experience of comfort will improve Outcome: Not Progressing   Problem: Safety: Goal: Ability to remain free from injury will improve Outcome: Not Progressing   Problem: Skin Integrity: Goal: Risk for impaired skin integrity will decrease Outcome: Not Progressing   Problem: Education: Goal: Ability to state activities that reduce stress will improve Outcome: Not Progressing   Problem: Coping: Goal: Ability to identify and develop effective coping behavior will improve Outcome: Not Progressing   Problem: Self-Concept: Goal: Ability to identify factors that promote anxiety will improve Outcome: Not Progressing Goal: Level of anxiety will decrease Outcome: Not Progressing Goal: Ability to modify response to factors that promote anxiety will improve Outcome: Not Progressing

## 2020-10-23 MED ORDER — ASPIRIN-ACETAMINOPHEN-CAFFEINE 250-250-65 MG PO TABS
1.0000 | ORAL_TABLET | Freq: Four times a day (QID) | ORAL | Status: DC | PRN
  Filled 2020-10-23: qty 1

## 2020-10-23 MED ORDER — RISPERIDONE 1 MG PO TBDP
2.0000 mg | ORAL_TABLET | Freq: Every day | ORAL | Status: DC
  Administered 2020-10-23 – 2020-10-28 (×6): 2 mg via ORAL
  Filled 2020-10-23 (×6): qty 2

## 2020-10-23 NOTE — Progress Notes (Signed)
Pt reports feeling lightheaded and dizzy. VSS, The called will be notified. Pt uses a walker and  I assisted to her room. I instructed her to rest in bed until I speak with her doctor.

## 2020-10-23 NOTE — Plan of Care (Signed)
See progress notes. Problem: Activity: Goal: Will verbalize the importance of balancing activity with adequate rest periods Outcome: Not Progressing   Problem: Education: Goal: Will be free of psychotic symptoms Outcome: Not Progressing Goal: Knowledge of the prescribed therapeutic regimen will improve Outcome: Not Progressing   Problem: Coping: Goal: Coping ability will improve Outcome: Not Progressing Goal: Will verbalize feelings Outcome: Not Progressing   Problem: Health Behavior/Discharge Planning: Goal: Compliance with prescribed medication regimen will improve Outcome: Not Progressing   Problem: Nutritional: Goal: Ability to achieve adequate nutritional intake will improve Outcome: Not Progressing   Problem: Role Relationship: Goal: Ability to communicate needs accurately will improve Outcome: Not Progressing Goal: Ability to interact with others will improve Outcome: Not Progressing   Problem: Safety: Goal: Ability to redirect hostility and anger into socially appropriate behaviors will improve Outcome: Not Progressing Goal: Ability to remain free from injury will improve Outcome: Not Progressing   Problem: Self-Care: Goal: Ability to participate in self-care as condition permits will improve Outcome: Not Progressing   Problem: Self-Concept: Goal: Will verbalize positive feelings about self Outcome: Not Progressing   Problem: Education: Goal: Knowledge of General Education information will improve Description: Including pain rating scale, medication(s)/side effects and non-pharmacologic comfort measures Outcome: Not Progressing   Problem: Health Behavior/Discharge Planning: Goal: Ability to manage health-related needs will improve Outcome: Not Progressing   Problem: Clinical Measurements: Goal: Ability to maintain clinical measurements within normal limits will improve Outcome: Not Progressing Goal: Will remain free from infection Outcome: Not  Progressing Goal: Diagnostic test results will improve Outcome: Not Progressing Goal: Respiratory complications will improve Outcome: Not Progressing Goal: Cardiovascular complication will be avoided Outcome: Not Progressing   Problem: Activity: Goal: Risk for activity intolerance will decrease Outcome: Not Progressing   Problem: Nutrition: Goal: Adequate nutrition will be maintained Outcome: Not Progressing   Problem: Coping: Goal: Level of anxiety will decrease Outcome: Not Progressing   Problem: Elimination: Goal: Will not experience complications related to bowel motility Outcome: Not Progressing Goal: Will not experience complications related to urinary retention Outcome: Not Progressing   Problem: Pain Managment: Goal: General experience of comfort will improve Outcome: Not Progressing   Problem: Safety: Goal: Ability to remain free from injury will improve Outcome: Not Progressing   Problem: Skin Integrity: Goal: Risk for impaired skin integrity will decrease Outcome: Not Progressing   Problem: Education: Goal: Ability to state activities that reduce stress will improve Outcome: Not Progressing   Problem: Coping: Goal: Ability to identify and develop effective coping behavior will improve Outcome: Not Progressing   Problem: Self-Concept: Goal: Ability to identify factors that promote anxiety will improve Outcome: Not Progressing Goal: Level of anxiety will decrease Outcome: Not Progressing Goal: Ability to modify response to factors that promote anxiety will improve Outcome: Not Progressing

## 2020-10-23 NOTE — Progress Notes (Addendum)
Pt was in bed this am, I got her up to take her medication. Pt pleasant on appropriate and engaging. She denies SI/HI and A/V hallucinations. Pt verbalized delusional thoughts about her sister. She stated, "My sister is messing with me, and causing me problems." Pt did not go into details about these comments. Pt's gait is steady and she ambulates well with her walker. Pt has cold sores on her lips.

## 2020-10-23 NOTE — Group Note (Signed)
[facility] LCSW Group Therapy Note   Group Date: 10/23/2020 Start Time: 1300 End Time: 1400  Type of Therapy/Topic:  Group Therapy:  Feelings about Diagnosis  Participation Level:  Did Not Attend   Description of Group:    This group will allow patients to explore their thoughts and feelings about diagnoses they have received. Patients will be guided to explore their level of understanding and acceptance of these diagnoses. Facilitator will encourage patients to process their thoughts and feelings about the reactions of others to their diagnosis, and will guide patients in identifying ways to discuss their diagnosis with significant others in their lives. This group will be process-oriented, with patients participating in exploration of their own experiences as well as giving and receiving support and challenge from other group members.   Therapeutic Goals: 1. Patient will demonstrate understanding of diagnosis as evidence by identifying two or more symptoms of the disorder:  2. Patient will be able to express two feelings regarding the diagnosis 3. Patient will demonstrate ability to communicate their needs through discussion and/or role plays  Summary of Patient Progress: X  Therapeutic Modalities:   Cognitive Behavioral Therapy Brief Therapy Feelings Identification    Rosalin Buster J Dmari Schubring, LCSW 

## 2020-10-23 NOTE — Progress Notes (Signed)
Pt continues to be delusional, but more redirectable  this afternoon. She attended the afternoon groups. She asked for something for nausea, because "moving around so quickly makes her nauseous."
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
## 2020-10-23 NOTE — Plan of Care (Signed)
  Problem: Activity: Goal: Will verbalize the importance of balancing activity with adequate rest periods Outcome: Not Progressing   Problem: Education: Goal: Will be free of psychotic symptoms Outcome: Not Progressing   Problem: Safety: Goal: Ability to redirect hostility and anger into socially appropriate behaviors will improve Outcome: Progressing Goal: Ability to remain free from injury will improve Outcome: Progressing
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
## 2020-10-23 NOTE — Progress Notes (Signed)
Recreation Therapy Notes  Date: 10/23/2020  Time: 9:45 am   Location: Craft room    Behavioral response: N/A   Intervention Topic: Stress Management    Discussion/Intervention: Patient did not attend group.   Clinical Observations/Feedback:  Patient did not attend group.   [name] LRT/CTRS        Kirsten Richardson 10/23/2020 11:05 AM

## 2020-10-23 NOTE — [facility] Group Notes (Signed)
[facility] Group Notes:  (Nursing/MHT/Case Management/Adjunct)  Date:  10/23/2020  Time:  9:31 AM  Type of Therapy:  Community Meeting  Participation Level:  Did Not Attend   Lynelle Smoke [facility] 10/23/2020, 9:31 AM

## 2020-10-23 NOTE — Progress Notes (Signed)
At 1230 pm patient risperidone for anxiety/agitation. At 1330 pm pt had rested in bed, was calm, more coherent and able to follow directions.

## 2020-10-23 NOTE — Group Note (Signed)
[facility] LCSW Group Therapy Note    Group Date: 10/22/2020 Start Time: 1300 End Time: 1400  Type of Therapy and Topic:  Group Therapy:  Overcoming Obstacles  Participation Level:  [facility] PARTICIPATION LEVEL: Did Not Attend  Mood:  Description of Group:   In this group patients will be encouraged to explore what they see as obstacles to their own wellness and recovery. They will be guided to discuss their thoughts, feelings, and behaviors related to these obstacles. The group will process together ways to cope with barriers, with attention given to specific choices patients can make. Each patient will be challenged to identify changes they are motivated to make in order to overcome their obstacles. This group will be process-oriented, with patients participating in exploration of their own experiences as well as giving and receiving support and challenge from other group members.  Therapeutic Goals: 1. Patient will identify personal and current obstacles as they relate to admission. 2. Patient will identify barriers that currently interfere with their wellness or overcoming obstacles.  3. Patient will identify feelings, thought process and behaviors related to these barriers. 4. Patient will identify two changes they are willing to make to overcome these obstacles:    Summary of Patient Progress   CSW notes that group was not held 10/22/2020 due to acuity on unit.     Therapeutic Modalities:   Cognitive Behavioral Therapy Solution Focused Therapy Motivational Interviewing Relapse Prevention Therapy   Navayah Sok J Tenasia Aull, LCSW 

## 2020-10-24 MED ORDER — NICOTINE 21 MG/24HR TD PT24
21.0000 mg | MEDICATED_PATCH | Freq: Every day | TRANSDERMAL | Status: DC
  Administered 2020-10-24 – 2020-10-29 (×6): 21 mg via TRANSDERMAL
  Filled 2020-10-24 (×6): qty 1

## 2020-10-24 NOTE — Progress Notes (Signed)
D: Pt alert and oriented. Pt rates depression 0/10, hopelessness 0/10, and anxiety 2/10. Pt goal: "Talk to ACT and call medicare." Pt reports energy level as low and concentration as being good. Pt reports sleep last night as being good. Pt did not receive medications for sleep. Pt reports experiencing 4/10 left ankle pain and 6/10 right shoulder pain, per pt she received prn meds. Pt denies experiencing any SI/HI, or AVH at this time.   Pt is still observed as being paranoid in regards to her sister. Pt believed she was on the unit making the morning meal announcement. Pt also thought she heard her sister having conversation down the hall from her room and laughing. Pt was reassured her sister was not on the unit and that it was our MHT. Pt did not believe this Clinical research associate and continuing with it was her sister and that she was trying to keep her here.  A: Scheduled medications administered to pt, per MD orders. Support and encouragement provided. Frequent verbal contact made. Routine safety checks conducted q15 minutes.   R: No adverse drug reactions noted. Pt verbally contracts for safety at this time. Pt complaint with medications during this shift. Pt interacts minimally with others on the unit. Pt only comes out for meals, otherwise she self isolates to her room. Pt remains safe at this time. Will continue to monitor.

## 2020-10-24 NOTE — Progress Notes (Signed)
Continues to be paranoid, thinking that sister is coming out of tiles in ceiling and recording patient and showing them to peers on unit. Patient states she was going to stay up all night, later deciding to push the bed to the bookshelf so that staff could watch her bed all night. Patient up in middle of night drinking water. No other concerns voiced.  Encouragement and support provided. Safety checks maintained. Medications given as prescribed. Pt receptive and remains safe on unit with q 15 min checks.

## 2020-10-24 NOTE — Group Note (Signed)
[facility] LCSW Group Therapy Note   Group Date: 10/24/2020 Start Time: 1300 End Time: 1400   Type of Therapy/Topic:  Group Therapy:  Emotion Regulation  Participation Level:  Did Not Attend   Mood:  Description of Group:    The purpose of this group is to assist patients in learning to regulate negative emotions and experience positive emotions. Patients will be guided to discuss ways in which they have been vulnerable to their negative emotions. These vulnerabilities will be juxtaposed with experiences of positive emotions or situations, and patients challenged to use positive emotions to combat negative ones. Special emphasis will be placed on coping with negative emotions in conflict situations, and patients will process healthy conflict resolution skills.  Therapeutic Goals: Patient will identify two positive emotions or experiences to reflect on in order to balance out negative emotions:  Patient will label two or more emotions that they find the most difficult to experience:  Patient will be able to demonstrate positive conflict resolution skills through discussion or role plays:   Summary of Patient Progress: Patient did not attend group despite encouraged participation.     Therapeutic Modalities:   Cognitive Behavioral Therapy Feelings Identification Dialectical Behavioral Therapy   Payzlee Ryder W Deseray Daponte, LCSWA 
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
## 2020-10-24 NOTE — Progress Notes (Signed)
Recreation Therapy Notes   Date: 10/24/2020  Time: 10:00 am   Location: Craft room    Behavioral response: N/A   Intervention Topic: Communication    Discussion/Intervention: Patient did not attend group.   Clinical Observations/Feedback:  Patient did not attend group.   Lucy Boardman LRT/CTRS        Neri Vieyra 10/24/2020 11:54 AM

## 2020-10-25 MED ORDER — HYDROXYZINE HCL 50 MG PO TABS
50.0000 mg | ORAL_TABLET | Freq: Three times a day (TID) | ORAL | Status: DC | PRN
  Administered 2020-10-28: 50 mg via ORAL
  Filled 2020-10-25: qty 1

## 2020-10-25 MED ORDER — METHOTREXATE 2.5 MG PO TABS
20.0000 mg | ORAL_TABLET | ORAL | Status: DC
  Administered 2020-10-26: 20 mg via ORAL
  Filled 2020-10-25: qty 8

## 2020-10-25 NOTE — Progress Notes (Signed)
Patient pleasant and cooperative. Appropriate conversation. Medication compliant. Appropriate with staff and peers. Denies SI, HI, AVH. No complaints of pain or anxiety. Encouragement and support provided, safety checks maintained. Medications given as prescribed. Pt receptive and remains safe on unit with q 15 min checks.

## 2020-10-25 NOTE — [facility] Group Notes (Signed)
[facility] Group Notes:  (Nursing/MHT/Case Management/Adjunct)  Date:  10/25/2020  Time:  11:02 AM  Type of Therapy:  Community Meeting   Participation Level:  Did Not Attend   Summary of Progress/Problems:  Lynelle Smoke [facility] 10/25/2020, 11:02 AM

## 2020-10-25 NOTE — Progress Notes (Signed)
D: Pt alert and oriented. Pt rates depression 0/10, hopelessness 0/10, and anxiety 2/10. Pt goal: "Call a bunch of people." Pt reports energy level as low and concentration as being . Pt reports sleep last night as being good. Pt did receive medications for sleep and did find them helpful. Pt reports experiencing 6/10 Left ankle pain, prn pain meds offered and refused by pt. Pt denies experiencing any SI/HI, or AVH at this time.   A: Scheduled medications administered to pt, per MD orders. Support and encouragement provided. Frequent verbal contact made. Routine safety checks conducted q15 minutes.   R: No adverse drug reactions noted. Pt verbally contracts for safety at this time. Pt complaint with medications. Pt interacts minimally with others on the unit. Pt remains safe at this time. Will continue to monitor.

## 2020-10-25 NOTE — Progress Notes (Signed)
Patient is pleasant and cooperative. She was in an upbeat mood as her friend had just left from a visitation visit.  She continues to be paranoid r/t her sister and a female friend that she reports are coming through the vents to visit her. She currently denies si/hi/avh.  She does endorse anxiety and pain.  Will continue to monitor with Q 15 minute safety rounds. Encouraged her to come to staff with any concerns.    Cleo Butler-Nicholson, LPN

## 2020-10-25 NOTE — Plan of Care (Signed)
  Problem: Education: Goal: Will be free of psychotic symptoms Outcome: Progressing Goal: Knowledge of the prescribed therapeutic regimen will improve Outcome: Progressing   Problem: Coping: Goal: Coping ability will improve Outcome: Progressing Goal: Will verbalize feelings Outcome: Progressing   Problem: Health Behavior/Discharge Planning: Goal: Compliance with prescribed medication regimen will improve Outcome: Progressing   

## 2020-10-25 NOTE — Group Note (Signed)
LCSW Group Therapy Note   Group Date: 10/25/2020 Start Time: 1300 End Time: 1400   Type of Therapy and Topic:  Group Therapy:   Participation Level:  Did Not Attend  Description of Group: CSW assisted patients in making SMART goals. CSW opened up group with an ice breaker before starting group in order to increase group cohesion. At the beginning of group we all went around to discuss what are goals were and how to set and achieve each goal. Members were able to provide feedback to each other and amend their goals appropriately.    Therapeutic Goals:  1. Patient will learn how to develop goals.  2. Patient will be able to identify each achieved goal. 3 Patient will be able to tie goal back into reason for hospital admission.    Summary of Patient Progress:    Patient did not attend group despite encouraged participation.   Therapeutic Modalities:   Almedia Balls 10/25/2020  2:26 PM
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
## 2020-10-25 NOTE — Progress Notes (Signed)
Recreation Therapy Notes  Date: 10/25/2020  Time: 9:30    Location: Craft room    Behavioral response: N/A   Intervention Topic: Animal Assisted therapy    Discussion/Intervention: Patient did not attend group.   Clinical Observations/Feedback:  Patient did not attend group.   Petrea Fredenburg LRT/CTRS        Devonda Pequignot 10/25/2020 12:38 PM

## 2020-10-26 MED ORDER — MENTHOL 3 MG MT LOZG
1.0000 | LOZENGE | OROMUCOSAL | Status: DC | PRN
  Filled 2020-10-26: qty 9

## 2020-10-26 NOTE — [facility] Counselor (Signed)
CSW returned call from [place] with [place], [phone].  CSW was informed that patient was a current patient of [facility] Benetta Spar).  She reports that patient missed an appointment on 10/24/2020.  She reports that she would like patient to be linked to anew medication due to pt declining to take Vraylar.  She reports that the patent was evidenced to have done well on Vraylar, however, felt that the medication had a negative effect on her.    She reports that Amalie, with [facility] will email a medication list so that everyone is on the same page.   Penni Homans, MSW, LCSW 10/26/2020 2:24 PM

## 2020-10-26 NOTE — [facility] Group Notes (Signed)
[facility] Group Notes:  (Nursing/MHT/Case Management/Adjunct)  Date:  10/26/2020  Time:  9:12 AM  Type of Therapy:  Group Therapy  Participation Level:  Did Not Attend  Participation Quality:    Affect:    Cognitive:    Insight:    Engagement in Group:  None  Modes of Intervention:  Support  Summary of Progress/Problems: Patient was sleep during the group. Staff encouraged patient to participate.  Sharee Pimple 10/26/2020, 9:12 AM

## 2020-10-26 NOTE — Group Note (Signed)
[facility] LCSW Group Therapy Note   Group Date: 10/26/2020 Start Time: 1300 End Time: 1400  Type of Therapy and Topic:  Group Therapy:  Feelings around Relapse and Recovery  Participation Level:  Did Not Attend   Mood:  Description of Group:    Patients in this group will discuss emotions they experience before and after a relapse. They will process how experiencing these feelings, or avoidance of experiencing them, relates to having a relapse. Facilitator will guide patients to explore emotions they have related to recovery. Patients will be encouraged to process which emotions are more powerful. They will be guided to discuss the emotional reaction significant others in their lives may have to patients' relapse or recovery. Patients will be assisted in exploring ways to respond to the emotions of others without this contributing to a relapse.  Therapeutic Goals: Patient will identify two or more emotions that lead to relapse for them:  Patient will identify two emotions that result when they relapse:  Patient will identify two emotions related to recovery:  Patient will demonstrate ability to communicate their needs through discussion and/or role plays.   Summary of Patient Progress:  Group was not held due to acuity of unit.      Therapeutic Modalities:   Cognitive Behavioral Therapy Solution-Focused Therapy Assertiveness Training Relapse Prevention Therapy   Keryl Gholson J Carmine Youngberg, LCSW 

## 2020-10-26 NOTE — Plan of Care (Signed)
Pt rates anxiety 2/10. Pt denies depression, SI, HI and AVH.  Pt was educated on care plan and verbalizes understanding. Torrie Mayers RN Problem: Activity: Goal: Will verbalize the importance of balancing activity with adequate rest periods Outcome: Progressing   Problem: Education: Goal: Will be free of psychotic symptoms Outcome: Progressing Goal: Knowledge of the prescribed therapeutic regimen will improve Outcome: Progressing   Problem: Coping: Goal: Coping ability will improve Outcome: Progressing Goal: Will verbalize feelings Outcome: Progressing   Problem: Health Behavior/Discharge Planning: Goal: Compliance with prescribed medication regimen will improve Outcome: Progressing   Problem: Nutritional: Goal: Ability to achieve adequate nutritional intake will improve Outcome: Progressing   Problem: Role Relationship: Goal: Ability to communicate needs accurately will improve Outcome: Progressing Goal: Ability to interact with others will improve Outcome: Progressing   Problem: Safety: Goal: Ability to redirect hostility and anger into socially appropriate behaviors will improve Outcome: Progressing Goal: Ability to remain free from injury will improve Outcome: Progressing   Problem: Self-Care: Goal: Ability to participate in self-care as condition permits will improve Outcome: Progressing   Problem: Self-Concept: Goal: Will verbalize positive feelings about self Outcome: Progressing   Problem: Education: Goal: Knowledge of General Education information will improve Description: Including pain rating scale, medication(s)/side effects and non-pharmacologic comfort measures Outcome: Progressing   Problem: Health Behavior/Discharge Planning: Goal: Ability to manage health-related needs will improve Outcome: Progressing   Problem: Clinical Measurements: Goal: Ability to maintain clinical measurements within normal limits will improve Outcome: Progressing Goal:  Will remain free from infection Outcome: Progressing Goal: Diagnostic test results will improve Outcome: Progressing Goal: Respiratory complications will improve Outcome: Progressing Goal: Cardiovascular complication will be avoided Outcome: Progressing   Problem: Activity: Goal: Risk for activity intolerance will decrease Outcome: Progressing   Problem: Nutrition: Goal: Adequate nutrition will be maintained Outcome: Progressing   Problem: Coping: Goal: Level of anxiety will decrease Outcome: Progressing   Problem: Elimination: Goal: Will not experience complications related to bowel motility Outcome: Progressing Goal: Will not experience complications related to urinary retention Outcome: Progressing   Problem: Pain Managment: Goal: General experience of comfort will improve Outcome: Progressing   Problem: Safety: Goal: Ability to remain free from injury will improve Outcome: Progressing   Problem: Skin Integrity: Goal: Risk for impaired skin integrity will decrease Outcome: Progressing   Problem: Education: Goal: Ability to state activities that reduce stress will improve Outcome: Progressing   Problem: Coping: Goal: Ability to identify and develop effective coping behavior will improve Outcome: Progressing   Problem: Self-Concept: Goal: Ability to identify factors that promote anxiety will improve Outcome: Progressing Goal: Level of anxiety will decrease Outcome: Progressing Goal: Ability to modify response to factors that promote anxiety will improve Outcome: Progressing

## 2020-10-26 NOTE — Progress Notes (Addendum)
Recreation Therapy Notes  Date: 10/26/2020  Time: 10:15 am   Location: Craft room   Behavioral response: Appropriate  Intervention Topic: Teamwork    Discussion/Intervention:  Group content on today was focused on teamwork. The group identified what teamwork is. Individuals described who is a part of their team. Patients expressed why they thought teamwork is important. The group stated reasons why they thought it was easier to work with a Comptroller team. Individuals discussed some positives and negatives of working with a team. Patients gave examples of past experiences they had while working with a team. The group participated in the intervention "Story in a bag", patients were in groups and were able to test their skill in a team setting.  Clinical Observations/Feedback: Patient came to group and define team work as being in a group to achieve a specific goals. Participant identified her counselor as a team member. Individual was social with staff while participating in the intervention.  Jaielle Dlouhy LRT/CTRS         Xavion Muscat 10/26/2020 12:28 PM

## 2020-10-26 NOTE — Progress Notes (Signed)
Pt has been calm, cooperative and med compliant today. Pt attended group. Pt states that she is feeling much better Appetite is good. Torrie Mayers RN
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
## 2020-10-27 NOTE — BH IP Treatment Plan (Signed)
Interdisciplinary Treatment and Diagnostic Plan Update  10/27/2020 Time of Session: 9:30AM Kirsten Richardson MRN: [id number]  Principal Diagnosis: Bipolar disorder with depression [facility])  Secondary Diagnoses: Principal Problem:   Bipolar disorder with depression (HCC) Active Problems:   Hypothyroidism due to acquired atrophy of thyroid   Essential hypertension   COPD (chronic obstructive pulmonary disease) (HCC)   Constipation   Current Medications:  Current Facility-Administered Medications  Medication Dose Route Frequency Provider Last Rate Last Admin   acetaminophen (TYLENOL) tablet 650 mg  650 mg Oral Q6H PRN Vanetta Mulders, NP       albuterol (VENTOLIN HFA) 108 (90 Base) MCG/ACT inhaler 1-2 puff  1-2 puff Inhalation Q4H PRN Jesse Sans, MD       alum & mag hydroxide-simeth (MAALOX/MYLANTA) 200-200-20 MG/5ML suspension 30 mL  30 mL Oral Q4H PRN Vanetta Mulders, NP       aspirin-acetaminophen-caffeine (EXCEDRIN MIGRAINE) per tablet 1 tablet  1 tablet Oral Q6H PRN Jesse Sans, MD       atorvastatin (LIPITOR) tablet 20 mg  20 mg Oral QHS Les Pou M, MD   20 mg at 10/26/20 2121   diclofenac Sodium (VOLTAREN) 1 % topical gel 2 g  2 g Topical QID PRN Jesse Sans, MD   2 g at 10/24/20 2118   DULoxetine (CYMBALTA) DR capsule 60 mg  60 mg Oral Daily Jesse Sans, MD   60 mg at 10/27/20 1026   folic acid (FOLVITE) tablet 1 mg  1 mg Oral Daily Jesse Sans, MD   1 mg at 10/27/20 1026   hydrOXYzine (ATARAX/VISTARIL) tablet 50 mg  50 mg Oral TID PRN Jesse Sans, MD       ibuprofen (ADVIL) tablet 600 mg  600 mg Oral Q6H PRN Jesse Sans, MD   600 mg at 10/26/20 2121   influenza vac split quadrivalent PF (FLUARIX) injection 0.5 mL  0.5 mL Intramuscular Tomorrow-1000 Jesse Sans, MD       lamoTRIgine (LAMICTAL) tablet 300 mg  300 mg Oral Daily Les Pou M, MD   300 mg at 10/27/20 1025   levothyroxine (SYNTHROID) tablet 75 mcg  75 mcg Oral Q0600  Jesse Sans, MD   75 mcg at 10/27/20 0636   lubiprostone (AMITIZA) capsule 24 mcg  24 mcg Oral Gretchen Portela, MD   24 mcg at 10/27/20 1027   magnesium hydroxide (MILK OF MAGNESIA) suspension 30 mL  30 mL Oral Daily PRN Gabriel Cirri F, NP       menthol-cetylpyridinium (CEPACOL) lozenge 3 mg  1 lozenge Oral PRN Jesse Sans, MD       methotrexate (RHEUMATREX) tablet 20 mg  20 mg Oral Weekly Les Pou M, MD   20 mg at 10/26/20 1008   mirabegron ER (MYRBETRIQ) tablet 25 mg  25 mg Oral QHS Jesse Sans, MD   25 mg at 10/26/20 2120   nicotine (NICODERM CQ - dosed in mg/24 hours) patch 21 mg  21 mg Transdermal Daily Clapacs, John T, MD   21 mg at 10/27/20 1027   ondansetron (ZOFRAN) tablet 8 mg  8 mg Oral Q8H PRN Jesse Sans, MD   8 mg at 10/23/20 1815   pantoprazole (PROTONIX) EC tablet 40 mg  40 mg Oral Daily Jesse Sans, MD   40 mg at 10/27/20 1026   risperiDONE (RISPERDAL M-TABS) disintegrating tablet 2 mg  2 mg Oral QHS Les Pou M,  MD   2 mg at 10/26/20 2120   PTA Medications: Medications Prior to Admission  Medication Sig Dispense Refill Last Dose   albuterol (VENTOLIN HFA) 108 (90 Base) MCG/ACT inhaler Inhale 1-2 puffs into the lungs every 4 (four) hours as needed for wheezing or shortness of breath. 1 Inhaler 1    amphetamine-dextroamphetamine (ADDERALL) 10 MG tablet Take 10 mg by mouth 2 (two) times daily.      atorvastatin (LIPITOR) 20 MG tablet Take 1 tablet (20 mg total) by mouth at bedtime. (Patient not taking: Reported on 10/16/2020) 30 tablet 1    Buprenorphine HCl-Naloxone HCl 8-2 MG FILM Take 0.5 Film by mouth 6 (six) times daily.      cariprazine (VRAYLAR) 3 MG capsule Take 1 capsule (3 mg total) by mouth daily. (Patient not taking: Reported on 10/16/2020) 30 capsule 1    cetirizine (ZYRTEC) 10 MG tablet Take 10 mg by mouth daily.      DULoxetine (CYMBALTA) 60 MG capsule Take 1 capsule (60 mg total) by mouth daily. 30 capsule 1     famotidine (PEPCID) 20 MG tablet TAKE 1 TABLET BY MOUTH AT BEDTIME. (Patient taking differently: Take 20 mg by mouth 2 (two) times daily.) 30 tablet 1    ferrous sulfate 324 MG TBEC Take 648 mg by mouth daily with breakfast.      folic acid (FOLVITE) 1 MG tablet Take 1 mg by mouth daily.      furosemide (LASIX) 20 MG tablet Take 20 mg by mouth daily as needed for fluid or edema.      gabapentin (NEURONTIN) 300 MG capsule Take 1 capsule (300 mg total) by mouth 4 (four) times daily -  with meals and at bedtime. (Patient not taking: Reported on 10/16/2020) 120 capsule 1    gabapentin (NEURONTIN) 600 MG tablet Take 300 mg by mouth in the morning, at noon, in the evening, and at bedtime.      lamoTRIgine (LAMICTAL) 150 MG tablet Take 2 tablets (300 mg total) by mouth daily. 60 tablet 1    levothyroxine (SYNTHROID) 75 MCG tablet Take 75 mcg by mouth daily before breakfast.      losartan (COZAAR) 100 MG tablet Take 100 mg by mouth daily.      lubiprostone (AMITIZA) 24 MCG capsule Take 24 mcg by mouth every other day.      meloxicam (MOBIC) 15 MG tablet Take 15 mg by mouth daily.      methotrexate (RHEUMATREX) 2.5 MG tablet Take 20 mg by mouth every Friday.      MYRBETRIQ 25 MG TB24 tablet Take 25 mg by mouth at bedtime.      omeprazole (PRILOSEC) 40 MG capsule Take 40 mg by mouth daily.      ondansetron (ZOFRAN ODT) 4 MG disintegrating tablet Take 1 tablet (4 mg total) by mouth every 8 (eight) hours as needed for nausea or vomiting. 20 tablet 0    Potassium 99 MG TABS Take 6 tablets by mouth daily.      SYMBICORT 160-4.5 MCG/ACT inhaler Inhale 2 puffs into the lungs 2 (two) times daily.       TALTZ 80 MG/ML SOAJ Inject 80 mg into the skin every 30 (thirty) days.      topiramate (TOPAMAX) 50 MG tablet Take 150 mg by mouth daily.       Patient Stressors: Health problems   Substance abuse    Patient Strengths: Capable of independent living  Motivation for treatment/growth   Treatment Modalities:  Medication Management, Group therapy, Case management,  1 to 1 session with clinician, Psychoeducation, Recreational therapy.   Physician Treatment Plan for Primary Diagnosis: Bipolar disorder with depression (HCC) Long Term Goal(s): Improvement in symptoms so as ready for discharge   Short Term Goals: Ability to identify changes in lifestyle to reduce recurrence of condition will improve Ability to verbalize feelings will improve Ability to disclose and discuss suicidal ideas Ability to demonstrate self-control will improve Ability to identify and develop effective coping behaviors will improve Ability to maintain clinical measurements within normal limits will improve Compliance with prescribed medications will improve Ability to identify triggers associated with substance abuse/mental health issues will improve  Medication Management: Evaluate patient's response, side effects, and tolerance of medication regimen.  Therapeutic Interventions: 1 to 1 sessions, Unit Group sessions and Medication administration.  Evaluation of Outcomes: Progressing  Physician Treatment Plan for Secondary Diagnosis: Principal Problem:   Bipolar disorder with depression (HCC) Active Problems:   Hypothyroidism due to acquired atrophy of thyroid   Essential hypertension   COPD (chronic obstructive pulmonary disease) (HCC)   Constipation  Long Term Goal(s): Improvement in symptoms so as ready for discharge   Short Term Goals: Ability to identify changes in lifestyle to reduce recurrence of condition will improve Ability to verbalize feelings will improve Ability to disclose and discuss suicidal ideas Ability to demonstrate self-control will improve Ability to identify and develop effective coping behaviors will improve Ability to maintain clinical measurements within normal limits will improve Compliance with prescribed medications will improve Ability to identify triggers associated with substance  abuse/mental health issues will improve     Medication Management: Evaluate patient's response, side effects, and tolerance of medication regimen.  Therapeutic Interventions: 1 to 1 sessions, Unit Group sessions and Medication administration.  Evaluation of Outcomes: Progressing   RN Treatment Plan for Primary Diagnosis: Bipolar disorder with depression (HCC) Long Term Goal(s): Knowledge of disease and therapeutic regimen to maintain health will improve  Short Term Goals: Ability to demonstrate self-control, Ability to participate in decision making will improve, Ability to verbalize feelings will improve, Ability to disclose and discuss suicidal ideas, Ability to identify and develop effective coping behaviors will improve, and Compliance with prescribed medications will improve  Medication Management: RN will administer medications as ordered by provider, will assess and evaluate patient's response and provide education to patient for prescribed medication. RN will report any adverse and/or side effects to prescribing provider.  Therapeutic Interventions: 1 on 1 counseling sessions, Psychoeducation, Medication administration, Evaluate responses to treatment, Monitor vital signs and CBGs as ordered, Perform/monitor CIWA, COWS, AIMS and Fall Risk screenings as ordered, Perform wound care treatments as ordered.  Evaluation of Outcomes: Progressing   LCSW Treatment Plan for Primary Diagnosis: Bipolar disorder with depression (HCC) Long Term Goal(s): Safe transition to appropriate next level of care at discharge, Engage patient in therapeutic group addressing interpersonal concerns.  Short Term Goals: Engage patient in aftercare planning with referrals and resources, Increase social support, Increase ability to appropriately verbalize feelings, Increase emotional regulation, Facilitate acceptance of mental health diagnosis and concerns, Facilitate patient progression through stages of change  regarding substance use diagnoses and concerns, Identify triggers associated with mental health/substance abuse issues, and Increase skills for wellness and recovery  Therapeutic Interventions: Assess for all discharge needs, 1 to 1 time with Social worker, Explore available resources and support systems, Assess for adequacy in community support network, Educate family and significant other(s) on suicide prevention, Complete Psychosocial Assessment, Interpersonal group therapy.  Evaluation of Outcomes: Progressing   Progress in Treatment: Attending groups: No. Participating in groups: No. Taking medication as prescribed: Yes. Toleration medication: Yes. Family/Significant other contact made: No, will contact:  once permission is given. Patient understands diagnosis: Yes. Discussing patient identified problems/goals with staff: Yes. Medical problems stabilized or resolved: Yes. Denies suicidal/homicidal ideation: Yes. Issues/concerns per patient self-inventory: No. Other: none.  New problem(s) identified: No, Describe:  none.  Update 10/27/2020: No new problems identified at this time.   New Short Term/Long Term Goal(s): detox, elimination of symptoms of psychosis, medication management for mood stabilization; elimination of SI thoughts; development of comprehensive mental wellness/sobriety plan. Update 10/22/2020:  No changes at this time. Update 10/27/2020: No changes at this time.   Patient Goals:  Patient would like to stay with [facility] CST team, however, they believe she needs a higher level of care. Update 10/22/2020: No changes at this time. Update 10/27/2020: No changes at this time.   Discharge Plan or Barriers: None identified at this time.  Update 10/22/2020: Patient remains psychotic at this time.  She reports that she can not return to her home.  She reports that she is in need of transportation, a new phone and aftercare plans.  Patient has DSS involvement who is assisting with  housing needs. Update 10/27/2020: No changes at this time.   Reason for Continuation of Hospitalization: Hallucinations Other; describe paranoia  Estimated Length of Stay: TBD   Scribe for Treatment Team: Marletta Lor 10/27/2020 3:38 PM

## 2020-10-27 NOTE — Progress Notes (Signed)
Pt slept late today but took her meds when she got up,. She was mostly withdrawn today. She seemed to be paranoid about money in her bank. She has been calm and cooperative. Torrie Mayers RN

## 2020-10-27 NOTE — Plan of Care (Signed)
Pt denies depression, SI, HI and AVH. Pt rates anxiety 2/10. Pt was educated on care plan and verbalizes understanding. Torrie Mayers RN Problem: Activity: Goal: Will verbalize the importance of balancing activity with adequate rest periods Outcome: Progressing   Problem: Education: Goal: Will be free of psychotic symptoms Outcome: Progressing Goal: Knowledge of the prescribed therapeutic regimen will improve Outcome: Progressing   Problem: Coping: Goal: Coping ability will improve Outcome: Progressing Goal: Will verbalize feelings Outcome: Progressing   Problem: Health Behavior/Discharge Planning: Goal: Compliance with prescribed medication regimen will improve Outcome: Progressing   Problem: Nutritional: Goal: Ability to achieve adequate nutritional intake will improve Outcome: Progressing   Problem: Role Relationship: Goal: Ability to communicate needs accurately will improve Outcome: Progressing Goal: Ability to interact with others will improve Outcome: Progressing   Problem: Safety: Goal: Ability to redirect hostility and anger into socially appropriate behaviors will improve Outcome: Progressing Goal: Ability to remain free from injury will improve Outcome: Progressing   Problem: Self-Care: Goal: Ability to participate in self-care as condition permits will improve Outcome: Progressing   Problem: Self-Concept: Goal: Will verbalize positive feelings about self Outcome: Progressing   Problem: Education: Goal: Knowledge of General Education information will improve Description: Including pain rating scale, medication(s)/side effects and non-pharmacologic comfort measures Outcome: Progressing   Problem: Health Behavior/Discharge Planning: Goal: Ability to manage health-related needs will improve Outcome: Progressing   Problem: Clinical Measurements: Goal: Ability to maintain clinical measurements within normal limits will improve Outcome: Progressing Goal:  Will remain free from infection Outcome: Progressing Goal: Diagnostic test results will improve Outcome: Progressing Goal: Respiratory complications will improve Outcome: Progressing Goal: Cardiovascular complication will be avoided Outcome: Progressing   Problem: Activity: Goal: Risk for activity intolerance will decrease Outcome: Progressing   Problem: Nutrition: Goal: Adequate nutrition will be maintained Outcome: Progressing   Problem: Coping: Goal: Level of anxiety will decrease Outcome: Progressing   Problem: Elimination: Goal: Will not experience complications related to bowel motility Outcome: Progressing Goal: Will not experience complications related to urinary retention Outcome: Progressing   Problem: Pain Managment: Goal: General experience of comfort will improve Outcome: Progressing   Problem: Safety: Goal: Ability to remain free from injury will improve Outcome: Progressing   Problem: Skin Integrity: Goal: Risk for impaired skin integrity will decrease Outcome: Progressing   Problem: Education: Goal: Ability to state activities that reduce stress will improve Outcome: Progressing   Problem: Coping: Goal: Ability to identify and develop effective coping behavior will improve Outcome: Progressing   Problem: Self-Concept: Goal: Ability to identify factors that promote anxiety will improve Outcome: Progressing Goal: Level of anxiety will decrease Outcome: Progressing Goal: Ability to modify response to factors that promote anxiety will improve Outcome: Progressing

## 2020-10-27 NOTE — Group Note (Signed)
LCSW Group Therapy Note  Group Date: 10/27/2020 Start Time: 1315 End Time: 1415   Type of Therapy and Topic:  Group Therapy - Healthy vs Unhealthy Coping Skills  Participation Level:  Did Not Attend   Description of Group The focus of this group was to determine what unhealthy coping techniques typically are used by group members and what healthy coping techniques would be helpful in coping with various problems. Patients were guided in becoming aware of the differences between healthy and unhealthy coping techniques. Patients were asked to identify 2-3 healthy coping skills they would like to learn to use more effectively.  Therapeutic Goals Patients learned that coping is what human beings do all day long to deal with various situations in their lives Patients defined and discussed healthy vs unhealthy coping techniques Patients identified their preferred coping techniques and identified whether these were healthy or unhealthy Patients determined 2-3 healthy coping skills they would like to become more familiar with and use more often. Patients provided support and ideas to each other   Summary of Patient Progress: Patient did not attend group despite encouraged participation.    Therapeutic Modalities Cognitive Behavioral Therapy Motivational Interviewing  Marletta Lor 10/27/2020  5:39 PM
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
## 2020-10-27 NOTE — Plan of Care (Signed)
  Problem: Coping: Goal: Coping ability will improve Outcome: Progressing Goal: Will verbalize feelings Outcome: Progressing   Problem: Nutritional: Goal: Ability to achieve adequate nutritional intake will improve Outcome: Progressing   Problem: Safety: Goal: Ability to redirect hostility and anger into socially appropriate behaviors will improve Outcome: Progressing Goal: Ability to remain free from injury will improve Outcome: Progressing   Problem: Self-Care: Goal: Ability to participate in self-care as condition permits will improve Outcome: Progressing   Problem: Self-Concept: Goal: Will verbalize positive feelings about self Outcome: Progressing
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
## 2020-10-27 NOTE — Progress Notes (Signed)
Patient has been isolative to her room this evening. Did come out for at snack time. She is med compliant and received her without incident. Denies si/hi/avh. She does endorse anxiety, pain and depression at this encounter. Will continue to monitor with Q 15 minute safety checks.  Encouraged her to come to staff with any concerns.   Cleo Butler-Nicholson, LPN

## 2020-10-28 MED ORDER — LUBIPROSTONE 24 MCG PO CAPS
24.0000 ug | ORAL_CAPSULE | Freq: Every day | ORAL | Status: DC
  Administered 2020-10-29: 24 ug via ORAL
  Filled 2020-10-28: qty 1

## 2020-10-28 NOTE — Plan of Care (Signed)
  Problem: Activity: Goal: Will verbalize the importance of balancing activity with adequate rest periods Outcome: Progressing   Problem: Education: Goal: Will be free of psychotic symptoms Outcome: Progressing Goal: Knowledge of the prescribed therapeutic regimen will improve Outcome: Progressing   Problem: Coping: Goal: Coping ability will improve Outcome: Progressing Goal: Will verbalize feelings Outcome: Progressing   Problem: Health Behavior/Discharge Planning: Goal: Compliance with prescribed medication regimen will improve Outcome: Progressing   Problem: Nutritional: Goal: Ability to achieve adequate nutritional intake will improve Outcome: Progressing   Problem: Role Relationship: Goal: Ability to communicate needs accurately will improve Outcome: Progressing Goal: Ability to interact with others will improve Outcome: Progressing   Problem: Safety: Goal: Ability to redirect hostility and anger into socially appropriate behaviors will improve Outcome: Progressing Goal: Ability to remain free from injury will improve Outcome: Progressing   Problem: Self-Care: Goal: Ability to participate in self-care as condition permits will improve Outcome: Progressing   Problem: Self-Concept: Goal: Will verbalize positive feelings about self Outcome: Progressing   Problem: Education: Goal: Knowledge of General Education information will improve Description: Including pain rating scale, medication(s)/side effects and non-pharmacologic comfort measures Outcome: Progressing   Problem: Health Behavior/Discharge Planning: Goal: Ability to manage health-related needs will improve Outcome: Progressing   Problem: Clinical Measurements: Goal: Ability to maintain clinical measurements within normal limits will improve Outcome: Progressing Goal: Will remain free from infection Outcome: Progressing Goal: Diagnostic test results will improve Outcome: Progressing Goal:  Respiratory complications will improve Outcome: Progressing Goal: Cardiovascular complication will be avoided Outcome: Progressing   Problem: Activity: Goal: Risk for activity intolerance will decrease Outcome: Progressing   Problem: Nutrition: Goal: Adequate nutrition will be maintained Outcome: Progressing   Problem: Coping: Goal: Level of anxiety will decrease Outcome: Progressing   Problem: Elimination: Goal: Will not experience complications related to bowel motility Outcome: Progressing Goal: Will not experience complications related to urinary retention Outcome: Progressing   Problem: Pain Managment: Goal: General experience of comfort will improve Outcome: Progressing   Problem: Safety: Goal: Ability to remain free from injury will improve Outcome: Progressing   Problem: Skin Integrity: Goal: Risk for impaired skin integrity will decrease Outcome: Progressing   Problem: Education: Goal: Ability to state activities that reduce stress will improve Outcome: Progressing   Problem: Coping: Goal: Ability to identify and develop effective coping behavior will improve Outcome: Progressing   Problem: Self-Concept: Goal: Ability to identify factors that promote anxiety will improve Outcome: Progressing Goal: Level of anxiety will decrease Outcome: Progressing Goal: Ability to modify response to factors that promote anxiety will improve Outcome: Progressing

## 2020-10-28 NOTE — Plan of Care (Signed)
  Problem: Activity: Goal: Will verbalize the importance of balancing activity with adequate rest periods Outcome: Progressing   Problem: Education: Goal: Will be free of psychotic symptoms Outcome: Progressing Goal: Knowledge of the prescribed therapeutic regimen will improve Outcome: Progressing   Problem: Health Behavior/Discharge Planning: Goal: Compliance with prescribed medication regimen will improve Outcome: Progressing

## 2020-10-28 NOTE — Group Note (Signed)
LCSW Group Therapy Note  Group Date: 10/28/2020 Start Time: 1300 End Time: 1400   Type of Therapy and Topic:  Group Therapy - How To Cope with Nervousness about Discharge   Participation Level:  Did Not Attend   Description of Group This process group involved identification of patients' feelings about discharge. Some of them are scheduled to be discharged soon, while others are new admissions, but each of them was asked to share thoughts and feelings surrounding discharge from the hospital. One common theme was that they are excited at the prospect of going home, while another was that many of them are apprehensive about sharing why they were hospitalized. Patients were given the opportunity to discuss these feelings with their peers in preparation for discharge.  Therapeutic Goals  Patient will identify their overall feelings about pending discharge. Patient will think about how they might proactively address issues that they believe will once again arise once they get home (i.e. with parents). Patients will participate in discussion about having hope for change.   Summary of Patient Progress: Patient did not attend group despite encouraged participation.    Therapeutic Modalities Cognitive Behavioral Therapy   Jeidy Hoerner K Junell Cullifer, LCSWA 10/28/2020  3:38 PM   

## 2020-10-28 NOTE — Progress Notes (Signed)
D: Pt alert and oriented. Pt rates depression 0/10, hopelessness 0/10, and anxiety 3/10. Pt goal: "Call Amanda-my emergency contact." Pt reports energy level as low and concentration as being poor. Pt reports sleep last night as being good. Pt did not receive medications for sleep. Pt reports experiencing 5/10 Left ankle pain and 8/10 Right shoulder pain at this time, prn meds given. Pt denies experiencing any SI/HI, or AVH at this time.   A: Scheduled medications administered to pt, per MD orders. Support and encouragement provided. Frequent verbal contact made. Routine safety checks conducted q15 minutes.   R: No adverse drug reactions noted. Pt verbally contracts for safety at this time. Pt complaint with medications. Pt interacts minimally with others on the unit. Pt remains safe at this time. Will continue to monitor.

## 2020-10-28 NOTE — Progress Notes (Signed)
Patient asked to get her medication early so she could go to bed. Not complaining of any audio or visual hallucinations. Denies SI. Later requested vistaril because she said that her medicine usually knocks her out but now she is having difficulty falling asleep.
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
## 2020-10-29 MED ORDER — HYDROXYZINE HCL 50 MG PO TABS: 50.0000 mg | ORAL_TABLET | Freq: Three times a day (TID) | ORAL | 1 refills | Status: DC | PRN

## 2020-10-29 MED ORDER — LAMOTRIGINE 150 MG PO TABS: 300.0000 mg | ORAL_TABLET | Freq: Every day | ORAL | 1 refills | Status: DC

## 2020-10-29 MED ORDER — RISPERIDONE 2 MG PO TABS: 2.0000 mg | ORAL_TABLET | Freq: Every day | ORAL | 1 refills | Status: DC

## 2020-10-29 MED ORDER — LEVOTHYROXINE SODIUM 75 MCG PO TABS: 75.0000 ug | ORAL_TABLET | Freq: Every day | ORAL | 1 refills | Status: DC

## 2020-10-29 MED ORDER — DULOXETINE HCL 60 MG PO CPEP: 60.0000 mg | ORAL_CAPSULE | Freq: Every day | ORAL | 1 refills | Status: DC

## 2020-10-29 NOTE — Progress Notes (Signed)
Recreation Therapy Notes  Date: 10/29/2020  Time: 10:00   Location: Craft room    Behavioral response: N/A   Intervention Topic: Coping skills   Discussion/Intervention: Patient did not attend group.   Clinical Observations/Feedback:  Patient did not attend group.   Dakiya Puopolo LRT/CTRS         Ipek Westra 10/29/2020 11:19 AM

## 2020-10-29 NOTE — Progress Notes (Signed)
Recreation Therapy Notes  INPATIENT RECREATION TR PLAN  Patient Details Name: Kirsten Richardson MRN: [id number] DOB: May 25, 1975 Today's Date: 10/29/2020  Rec Therapy Plan Is patient appropriate for Therapeutic Recreation?: Yes Treatment times per week: at least 3 Estimated Length of Stay: 5-7 days TR Treatment/Interventions: Group participation (Comment)  Discharge Criteria Pt will be discharged from therapy if:: Discharged Treatment plan/goals/alternatives discussed and agreed upon by:: Patient/family  Discharge Summary Short term goals set: Patient will engage in groups without prompting or encouragement from LRT x3 group sessions within 5 recreation therapy group sessions Short term goals met: Not met Progress toward goals comments: Groups attended Which groups?: Other (Comment) (Team work) Reason goals not met: Patient spent most of her time in her room. Therapeutic equipment acquired: N/A Reason patient discharged from therapy: Discharge from hospital Pt/family agrees with progress & goals achieved: Yes Date patient discharged from therapy: 10/29/20   Orlan Aversa 10/29/2020, 4:14 PM

## 2020-10-29 NOTE — Plan of Care (Signed)
Pt denies depression, SI, HI and AVH. Pt rates anxiety 2/10. Pt was educated on care plan and verbalizes understanding. Kirsten Mayers RN Problem: Activity: Goal: Will verbalize the importance of balancing activity with adequate rest periods Outcome: Adequate for Discharge   Problem: Education: Goal: Will be free of psychotic symptoms Outcome: Adequate for Discharge Goal: Knowledge of the prescribed therapeutic regimen will improve Outcome: Adequate for Discharge   Problem: Coping: Goal: Coping ability will improve Outcome: Adequate for Discharge Goal: Will verbalize feelings Outcome: Adequate for Discharge   Problem: Health Behavior/Discharge Planning: Goal: Compliance with prescribed medication regimen will improve Outcome: Adequate for Discharge   Problem: Nutritional: Goal: Ability to achieve adequate nutritional intake will improve Outcome: Adequate for Discharge   Problem: Role Relationship: Goal: Ability to communicate needs accurately will improve Outcome: Adequate for Discharge Goal: Ability to interact with others will improve Outcome: Adequate for Discharge   Problem: Safety: Goal: Ability to redirect hostility and anger into socially appropriate behaviors will improve Outcome: Adequate for Discharge Goal: Ability to remain free from injury will improve Outcome: Adequate for Discharge   Problem: Self-Care: Goal: Ability to participate in self-care as condition permits will improve Outcome: Adequate for Discharge   Problem: Self-Concept: Goal: Will verbalize positive feelings about self Outcome: Adequate for Discharge   Problem: Education: Goal: Knowledge of General Education information will improve Description: Including pain rating scale, medication(s)/side effects and non-pharmacologic comfort measures Outcome: Adequate for Discharge   Problem: Health Behavior/Discharge Planning: Goal: Ability to manage health-related needs will improve Outcome:  Adequate for Discharge   Problem: Clinical Measurements: Goal: Ability to maintain clinical measurements within normal limits will improve Outcome: Adequate for Discharge Goal: Will remain free from infection Outcome: Adequate for Discharge Goal: Diagnostic test results will improve Outcome: Adequate for Discharge Goal: Respiratory complications will improve Outcome: Adequate for Discharge Goal: Cardiovascular complication will be avoided Outcome: Adequate for Discharge   Problem: Activity: Goal: Risk for activity intolerance will decrease Outcome: Adequate for Discharge   Problem: Nutrition: Goal: Adequate nutrition will be maintained Outcome: Adequate for Discharge   Problem: Coping: Goal: Level of anxiety will decrease Outcome: Adequate for Discharge   Problem: Elimination: Goal: Will not experience complications related to bowel motility Outcome: Adequate for Discharge Goal: Will not experience complications related to urinary retention Outcome: Adequate for Discharge   Problem: Pain Managment: Goal: General experience of comfort will improve Outcome: Adequate for Discharge   Problem: Safety: Goal: Ability to remain free from injury will improve Outcome: Adequate for Discharge   Problem: Skin Integrity: Goal: Risk for impaired skin integrity will decrease Outcome: Adequate for Discharge   Problem: Education: Goal: Ability to state activities that reduce stress will improve Outcome: Adequate for Discharge   Problem: Coping: Goal: Ability to identify and develop effective coping behavior will improve Outcome: Adequate for Discharge   Problem: Self-Concept: Goal: Ability to identify factors that promote anxiety will improve Outcome: Adequate for Discharge Goal: Level of anxiety will decrease Outcome: Adequate for Discharge Goal: Ability to modify response to factors that promote anxiety will improve Outcome: Adequate for Discharge

## 2020-10-29 NOTE — Progress Notes (Signed)
   10/29/20 0981  Important Message  Medicare important message given? Yes    Important Message  Patient Details  Name: Kirsten Richardson MRN: [id number] Date of Birth: 05-20-1975   Medicare Important Message Given:  (P) Yes Patient provided IM form w/ [facility] contact information. Patient expressed no interest in appealing discharge at this time.     Corky Crafts, LCSWA 10/29/2020, 9:37 AM

## 2020-10-29 NOTE — Progress Notes (Signed)
  [facility] Adult Case Management Discharge Plan :  Will you be returning to the same living situation after discharge:  No. At discharge, do you have transportation home?: Yes,  Brendia Sacks to assist with transportation between 1100-1300.  Do you have the ability to pay for your medications: Yes,  [employer]  Release of information consent forms completed and in the chart;  Patient's signature needed at discharge.  Patient to Follow up at:  Follow-up Information     [place] to.   Why: Please present for scheduled hospital discharge appointment on 26 Oct 20222 at 1230PM. Contact information: [place] [address] [place], [place]. Go to.   Specialty: Psychiatry Why: Please call [phone] after discharge to schedule hopital discharge appointment. Contact information: [place] [address] [phone]                 Next level of care provider has access to [place] and Suicide Prevention discussed: Yes,  SPE completed with patient, declined consent for CSW to reach collateral.   Has patient been referred to the Quitline?: Patient refused referral  Patient has been referred for addiction treatment: N/A  CSW met with patient to discuss discharge plan. CSW completed Medicare IM notification with patient. Patient reports she plans on living with her son at [address] [place] "for a couple of days" until she finds alternative housing.  Durenda Hurt, LCSWA 10/29/2020, 9:40 AM
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
## 2020-10-29 NOTE — Progress Notes (Signed)
Pt denies SI, HI and AVH. Pt was educated on discharge plan and verbalizes understanding. Pt received her money, medication from pharmacy, belongings, AVS and d/c packet. Torrie Mayers RN

## 2020-10-29 NOTE — [facility] Suicide Risk Assessment (Signed)
[facility] Discharge Suicide Risk Assessment   Principal Problem: Bipolar disorder with depression [facility]) Discharge Diagnoses: Principal Problem:   Bipolar disorder with depression (HCC) Active Problems:   Hypothyroidism due to acquired atrophy of thyroid   Essential hypertension   COPD (chronic obstructive pulmonary disease) (HCC)   Constipation   Total Time spent with patient: 35 minutes- 25 minutes face-to-face contact with patient, 10 minutes documentation, coordination of care, scripts   Musculoskeletal: Strength & Muscle Tone: within normal limits Gait & Station: normal Patient leans: N/A  Psychiatric Specialty Exam  Presentation  General Appearance: Casual  Eye Contact:Good  Speech:Normal Rate  Speech Volume:Normal  Handedness:Right   Mood and Affect  Mood:Euthymic  Duration of Depression Symptoms: N/A  Affect:Congruent   Thought Process  Thought Processes:Goal Directed; Coherent  Descriptions of Associations:Intact  Orientation:Full (Time, Place and Person)  Thought Content:Logical  History of Schizophrenia/Schizoaffective disorder:No  Duration of Psychotic Symptoms:Greater than six months  Hallucinations:Hallucinations: None  Ideas of Reference:None  Suicidal Thoughts:Suicidal Thoughts: No  Homicidal Thoughts:Homicidal Thoughts: No   Sensorium  Memory:Immediate Fair; Recent Fair; Remote Fair  Judgment:Intact  Insight:Present   Executive Functions  Concentration:Fair  Attention Span:Fair  Recall:Fair  Fund of Knowledge:Fair  Language:Fair   Psychomotor Activity  Psychomotor Activity:Psychomotor Activity: Normal   Assets  Assets:Communication Skills; Desire for Improvement; Financial Resources/Insurance; Housing; Resilience; Social Support   Sleep  Sleep:Sleep: Good Number of Hours of Sleep: 8   Physical Exam: Physical Exam ROS Blood pressure 105/68, pulse 90, temperature 98.5 F (36.9 C), temperature source Oral,  resp. rate 18, height 5' (1.524 m), weight 74.8 kg, SpO2 96 %. Body mass index is 32.21 kg/m.  Mental Status Per Nursing Assessment::   On Admission:  NA  Demographic Factors:  Caucasian  Loss Factors: NA  Historical Factors: Impulsivity  Risk Reduction Factors:   Sense of responsibility to family, Living with another person, especially a relative, Positive social support, Positive therapeutic relationship, and Positive coping skills or problem solving skills  Continued Clinical Symptoms:  Bipolar Disorder:   Mixed State Previous Psychiatric Diagnoses and Treatments Medical Diagnoses and Treatments/Surgeries  Cognitive Features That Contribute To Risk:  None    Suicide Risk:  Minimal: No identifiable suicidal ideation.  Patients presenting with no risk factors but with morbid ruminations; may be classified as minimal risk based on the severity of the depressive symptoms    Plan Of Care/Follow-up recommendations:  Activity:  as tolerated Diet:  low sodium heart healthy diet  Jesse Sans, MD 10/29/2020, 8:53 AM

## 2020-10-29 NOTE — Plan of Care (Signed)
  Problem: Group Participation Goal: STG - Patient will engage in groups without prompting or encouragement from LRT x3 group sessions within 5 recreation therapy group sessions Description: STG - Patient will engage in groups without prompting or encouragement from LRT x3 group sessions within 5 recreation therapy group sessions 10/29/2020 1613 by Ernest Haber, LRT Outcome: Not Applicable [id number] by Ernest Haber, LRT Outcome: Not Met (add Reason) Note: Patient spent most of her time in her room.

## 2020-10-31 ENCOUNTER — Emergency Department (EMERGENCY_DEPARTMENT_HOSPITAL)
Admission: EM | Admit: 2020-10-31 | Discharge: 2020-11-01 | Disposition: A | Discharge status: Other Acute Inpatient Hospital | Payer: Medicare Other | Source: Home / Self Care | Attending: Emergency Medicine | Admitting: Emergency Medicine

## 2020-10-31 ENCOUNTER — Other Ambulatory Visit: Payer: Self-pay

## 2020-10-31 DIAGNOSIS — M5136 Other intervertebral disc degeneration, lumbar region: Secondary | ICD-10-CM | POA: Insufficient documentation

## 2020-10-31 DIAGNOSIS — Z20822 Contact with and (suspected) exposure to covid-19: Secondary | ICD-10-CM | POA: Insufficient documentation

## 2020-10-31 DIAGNOSIS — R45851 Suicidal ideations: Secondary | ICD-10-CM | POA: Diagnosis not present

## 2020-10-31 DIAGNOSIS — G8929 Other chronic pain: Secondary | ICD-10-CM | POA: Diagnosis present

## 2020-10-31 DIAGNOSIS — F32A Depression, unspecified: Secondary | ICD-10-CM | POA: Insufficient documentation

## 2020-10-31 DIAGNOSIS — F315 Bipolar disorder, current episode depressed, severe, with psychotic features: Secondary | ICD-10-CM | POA: Diagnosis not present

## 2020-10-31 DIAGNOSIS — M5030 Other cervical disc degeneration, unspecified cervical region: Secondary | ICD-10-CM | POA: Diagnosis present

## 2020-10-31 DIAGNOSIS — Z87891 Personal history of nicotine dependence: Secondary | ICD-10-CM | POA: Insufficient documentation

## 2020-10-31 DIAGNOSIS — J449 Chronic obstructive pulmonary disease, unspecified: Secondary | ICD-10-CM | POA: Insufficient documentation

## 2020-10-31 DIAGNOSIS — R45850 Homicidal ideations: Secondary | ICD-10-CM | POA: Insufficient documentation

## 2020-10-31 DIAGNOSIS — F29 Unspecified psychosis not due to a substance or known physiological condition: Secondary | ICD-10-CM | POA: Insufficient documentation

## 2020-10-31 DIAGNOSIS — I10 Essential (primary) hypertension: Secondary | ICD-10-CM | POA: Insufficient documentation

## 2020-10-31 DIAGNOSIS — Z79899 Other long term (current) drug therapy: Secondary | ICD-10-CM | POA: Insufficient documentation

## 2020-10-31 DIAGNOSIS — F603 Borderline personality disorder: Secondary | ICD-10-CM | POA: Insufficient documentation

## 2020-10-31 DIAGNOSIS — F3163 Bipolar disorder, current episode mixed, severe, without psychotic features: Secondary | ICD-10-CM | POA: Diagnosis not present

## 2020-10-31 DIAGNOSIS — F319 Bipolar disorder, unspecified: Secondary | ICD-10-CM | POA: Diagnosis present

## 2020-10-31 DIAGNOSIS — E039 Hypothyroidism, unspecified: Secondary | ICD-10-CM | POA: Insufficient documentation

## 2020-10-31 DIAGNOSIS — M25559 Pain in unspecified hip: Secondary | ICD-10-CM | POA: Diagnosis present

## 2020-10-31 LAB — COMPREHENSIVE METABOLIC PANEL
ALT: 25 U/L (ref 0–44)
AST: 28 U/L (ref 15–41)
Albumin: 4.2 g/dL (ref 3.5–5.0)
Alkaline Phosphatase: 89 U/L (ref 38–126)
Anion gap: 5 (ref 5–15)
BUN: <5 mg/dL — ABNORMAL LOW (ref 6–20)
CO2: 31 mmol/L (ref 22–32)
Calcium: 9.6 mg/dL (ref 8.9–10.3)
Chloride: 103 mmol/L (ref 98–111)
Creatinine, Ser: 0.75 mg/dL (ref 0.44–1.00)
GFR, Estimated: >60 mL/min (ref >60)
Glucose, Bld: 90 mg/dL (ref 70–99)
Potassium: 4.6 mmol/L (ref 3.5–5.1)
Sodium: 139 mmol/L (ref 135–145)
Total Bilirubin: 0.6 mg/dL (ref 0.3–1.2)
Total Protein: 7.5 g/dL (ref 6.5–8.1)

## 2020-10-31 LAB — CBC
HCT: 40 % (ref 36.0–46.0)
Hemoglobin: 13.9 g/dL (ref 12.0–15.0)
MCH: 33.2 pg (ref 26.0–34.0)
MCHC: 34.8 g/dL (ref 30.0–36.0)
MCV: 95.5 fL (ref 80.0–100.0)
Platelets: 436 10*3/uL — ABNORMAL HIGH (ref 150–400)
RBC: 4.19 MIL/uL (ref 3.87–5.11)
RDW: 12.6 % (ref 11.5–15.5)
WBC: 6.7 10*3/uL (ref 4.0–10.5)
nRBC: 0 % (ref 0.0–0.2)

## 2020-10-31 LAB — URINE DRUG SCREEN, QUALITATIVE (ARMC ONLY)
Amphetamines, Ur Screen: NOT DETECTED
Barbiturates, Ur Screen: NOT DETECTED
Benzodiazepine, Ur Scrn: NOT DETECTED
Cannabinoid 50 Ng, Ur ~~LOC~~: NOT DETECTED
Cocaine Metabolite,Ur ~~LOC~~: NOT DETECTED
MDMA (Ecstasy)Ur Screen: NOT DETECTED
Methadone Scn, Ur: NOT DETECTED
Opiate, Ur Screen: NOT DETECTED
Phencyclidine (PCP) Ur S: NOT DETECTED
Tricyclic, Ur Screen: NOT DETECTED

## 2020-10-31 LAB — RESP PANEL BY RT-PCR (FLU A&B, COVID) ARPGX2
Influenza A by PCR: NEGATIVE
Influenza B by PCR: NEGATIVE
SARS Coronavirus 2 by RT PCR: NEGATIVE

## 2020-10-31 LAB — ETHANOL: Alcohol, Ethyl (B): <10 mg/dL (ref <10)

## 2020-10-31 LAB — ACETAMINOPHEN LEVEL: Acetaminophen (Tylenol), Serum: <10 ug/mL — ABNORMAL LOW (ref 10–30)

## 2020-10-31 LAB — SALICYLATE LEVEL: Salicylate Lvl: <7 mg/dL — ABNORMAL LOW (ref 7.0–30.0)

## 2020-10-31 MED ORDER — ATORVASTATIN CALCIUM 20 MG PO TABS
20.0000 mg | ORAL_TABLET | Freq: Every day | ORAL | Status: DC
Start: 2020-10-31 — End: 2020-11-01
  Filled 2020-10-31: qty 1

## 2020-10-31 MED ORDER — LEVOTHYROXINE SODIUM 75 MCG PO TABS
75.0000 ug | ORAL_TABLET | Freq: Every day | ORAL | Status: DC
  Administered 2020-11-01: 75 ug via ORAL
  Filled 2020-10-31: qty 1

## 2020-10-31 MED ORDER — RISPERIDONE 1 MG PO TABS: 2.0000 mg | ORAL_TABLET | Freq: Every day | ORAL | Status: DC

## 2020-10-31 MED ORDER — LUBIPROSTONE 24 MCG PO CAPS
24.0000 ug | ORAL_CAPSULE | ORAL | Status: DC
  Administered 2020-11-01: 24 ug via ORAL
  Filled 2020-10-31: qty 1

## 2020-10-31 MED ORDER — HYDROXYZINE HCL 25 MG PO TABS
50.0000 mg | ORAL_TABLET | Freq: Three times a day (TID) | ORAL | Status: DC | PRN
  Administered 2020-11-01: 50 mg via ORAL
  Filled 2020-10-31: qty 2

## 2020-10-31 MED ORDER — DULOXETINE HCL 60 MG PO CPEP
60.0000 mg | ORAL_CAPSULE | Freq: Every day | ORAL | Status: DC
Start: 2020-11-01 — End: 2020-11-01
  Administered 2020-11-01: 60 mg via ORAL
  Filled 2020-10-31: qty 1

## 2020-10-31 MED ORDER — LAMOTRIGINE 100 MG PO TABS
300.0000 mg | ORAL_TABLET | Freq: Every day | ORAL | Status: DC
  Administered 2020-11-01: 300 mg via ORAL
  Filled 2020-10-31: qty 3

## 2020-10-31 MED ORDER — NICOTINE 21 MG/24HR TD PT24
21.0000 mg | MEDICATED_PATCH | Freq: Once | TRANSDERMAL | Status: DC
  Administered 2020-10-31: 21 mg via TRANSDERMAL
  Filled 2020-10-31: qty 1

## 2020-10-31 NOTE — ED Notes (Signed)
[facility] PENDING  CONSULT ?
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
## 2020-10-31 NOTE — ED Notes (Addendum)
Dressed out by [employer].1 bag of belongings correctly labeled contains: socks, crocks, pants, belt, underwear, bra, sweater, tank top, drink cup, 2 silver colored earrings, 2 rings, purse, cell phone inside purse.

## 2020-10-31 NOTE — ED Notes (Signed)
Given dinner tray
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
## 2020-11-01 ENCOUNTER — Inpatient Hospital Stay
Admission: AD | Admit: 2020-11-01 | Discharge: 2020-11-06 | DRG: 885 | Disposition: A | Discharge status: Home / Self Care | Payer: Medicare Other | Source: Intra-hospital | Attending: Behavioral Health | Admitting: Behavioral Health

## 2020-11-01 ENCOUNTER — Encounter: Payer: Self-pay | Admitting: Behavioral Health

## 2020-11-01 DIAGNOSIS — F1120 Opioid dependence, uncomplicated: Secondary | ICD-10-CM | POA: Diagnosis present

## 2020-11-01 DIAGNOSIS — R45850 Homicidal ideations: Secondary | ICD-10-CM | POA: Diagnosis present

## 2020-11-01 DIAGNOSIS — J449 Chronic obstructive pulmonary disease, unspecified: Secondary | ICD-10-CM | POA: Diagnosis present

## 2020-11-01 DIAGNOSIS — R45851 Suicidal ideations: Secondary | ICD-10-CM | POA: Diagnosis present

## 2020-11-01 DIAGNOSIS — Z87891 Personal history of nicotine dependence: Secondary | ICD-10-CM

## 2020-11-01 DIAGNOSIS — E034 Atrophy of thyroid (acquired): Secondary | ICD-10-CM | POA: Diagnosis present

## 2020-11-01 DIAGNOSIS — N393 Stress incontinence (female) (male): Secondary | ICD-10-CM | POA: Diagnosis present

## 2020-11-01 DIAGNOSIS — K219 Gastro-esophageal reflux disease without esophagitis: Secondary | ICD-10-CM | POA: Diagnosis present

## 2020-11-01 DIAGNOSIS — F314 Bipolar disorder, current episode depressed, severe, without psychotic features: Secondary | ICD-10-CM | POA: Insufficient documentation

## 2020-11-01 DIAGNOSIS — G8929 Other chronic pain: Secondary | ICD-10-CM | POA: Diagnosis present

## 2020-11-01 DIAGNOSIS — M25559 Pain in unspecified hip: Secondary | ICD-10-CM | POA: Diagnosis present

## 2020-11-01 DIAGNOSIS — L4050 Arthropathic psoriasis, unspecified: Secondary | ICD-10-CM | POA: Diagnosis present

## 2020-11-01 DIAGNOSIS — M5030 Other cervical disc degeneration, unspecified cervical region: Secondary | ICD-10-CM | POA: Diagnosis present

## 2020-11-01 DIAGNOSIS — Z818 Family history of other mental and behavioral disorders: Secondary | ICD-10-CM | POA: Diagnosis not present

## 2020-11-01 DIAGNOSIS — I10 Essential (primary) hypertension: Secondary | ICD-10-CM | POA: Diagnosis present

## 2020-11-01 DIAGNOSIS — F315 Bipolar disorder, current episode depressed, severe, with psychotic features: Principal | ICD-10-CM | POA: Diagnosis present

## 2020-11-01 DIAGNOSIS — F3163 Bipolar disorder, current episode mixed, severe, without psychotic features: Secondary | ICD-10-CM

## 2020-11-01 DIAGNOSIS — E785 Hyperlipidemia, unspecified: Secondary | ICD-10-CM | POA: Diagnosis present

## 2020-11-01 DIAGNOSIS — Z20822 Contact with and (suspected) exposure to covid-19: Secondary | ICD-10-CM | POA: Diagnosis present

## 2020-11-01 DIAGNOSIS — F603 Borderline personality disorder: Secondary | ICD-10-CM | POA: Diagnosis present

## 2020-11-01 MED ORDER — PANTOPRAZOLE SODIUM 40 MG PO TBEC
40.0000 mg | DELAYED_RELEASE_TABLET | Freq: Every day | ORAL | Status: DC
  Administered 2020-11-02 – 2020-11-06 (×5): 40 mg via ORAL
  Filled 2020-11-01 (×6): qty 1

## 2020-11-01 MED ORDER — DULOXETINE HCL 30 MG PO CPEP
60.0000 mg | ORAL_CAPSULE | Freq: Every day | ORAL | Status: DC
  Administered 2020-11-02 – 2020-11-06 (×5): 60 mg via ORAL
  Filled 2020-11-01 (×5): qty 2

## 2020-11-01 MED ORDER — ALBUTEROL SULFATE HFA 108 (90 BASE) MCG/ACT IN AERS
1.0000 | INHALATION_SPRAY | RESPIRATORY_TRACT | Status: DC | PRN
  Filled 2020-11-01: qty 6.7

## 2020-11-01 MED ORDER — LUBIPROSTONE 24 MCG PO CAPS
24.0000 ug | ORAL_CAPSULE | ORAL | Status: DC
  Administered 2020-11-03 – 2020-11-05 (×2): 24 ug via ORAL
  Filled 2020-11-01 (×2): qty 1

## 2020-11-01 MED ORDER — METHOTREXATE 2.5 MG PO TABS
20.0000 mg | ORAL_TABLET | ORAL | Status: DC
Start: 2020-11-02 — End: 2020-11-06
  Administered 2020-11-02: 20 mg via ORAL
  Filled 2020-11-01: qty 8

## 2020-11-01 MED ORDER — MAGNESIUM HYDROXIDE 400 MG/5ML PO SUSP: 30.0000 mL | Freq: Every day | ORAL | Status: DC | PRN

## 2020-11-01 MED ORDER — LEVOTHYROXINE SODIUM 50 MCG PO TABS
75.0000 ug | ORAL_TABLET | Freq: Every day | ORAL | Status: DC
Start: 2020-11-02 — End: 2020-11-06
  Administered 2020-11-02 – 2020-11-06 (×5): 75 ug via ORAL
  Filled 2020-11-01 (×5): qty 2

## 2020-11-01 MED ORDER — LAMOTRIGINE 100 MG PO TABS
300.0000 mg | ORAL_TABLET | Freq: Every day | ORAL | Status: DC
Start: 2020-11-02 — End: 2020-11-06
  Administered 2020-11-02 – 2020-11-06 (×5): 300 mg via ORAL
  Filled 2020-11-01 (×5): qty 3

## 2020-11-01 MED ORDER — ATORVASTATIN CALCIUM 20 MG PO TABS
20.0000 mg | ORAL_TABLET | Freq: Every day | ORAL | Status: DC
  Administered 2020-11-01 – 2020-11-05 (×6): 20 mg via ORAL
  Filled 2020-11-01 (×6): qty 1

## 2020-11-01 MED ORDER — MIRABEGRON ER 25 MG PO TB24
25.0000 mg | ORAL_TABLET | Freq: Every day | ORAL | Status: DC
  Administered 2020-11-01 – 2020-11-05 (×5): 25 mg via ORAL
  Filled 2020-11-01 (×6): qty 1

## 2020-11-01 MED ORDER — FOLIC ACID 1 MG PO TABS
1.0000 mg | ORAL_TABLET | Freq: Every day | ORAL | Status: DC
  Administered 2020-11-02 – 2020-11-06 (×5): 1 mg via ORAL
  Filled 2020-11-01 (×6): qty 1

## 2020-11-01 MED ORDER — ACETAMINOPHEN 325 MG PO TABS
650.0000 mg | ORAL_TABLET | Freq: Four times a day (QID) | ORAL | Status: DC | PRN
  Administered 2020-11-03: 650 mg via ORAL
  Filled 2020-11-01: qty 2

## 2020-11-01 MED ORDER — ALUM & MAG HYDROXIDE-SIMETH 200-200-20 MG/5ML PO SUSP: 30.0000 mL | ORAL | Status: DC | PRN

## 2020-11-01 MED ORDER — RISPERIDONE 1 MG PO TABS
2.0000 mg | ORAL_TABLET | Freq: Every day | ORAL | Status: DC
  Administered 2020-11-01 – 2020-11-04 (×4): 2 mg via ORAL
  Filled 2020-11-01 (×4): qty 2

## 2020-11-01 MED ORDER — HYDROXYZINE HCL 50 MG PO TABS
50.0000 mg | ORAL_TABLET | Freq: Three times a day (TID) | ORAL | Status: DC | PRN
  Administered 2020-11-01 – 2020-11-05 (×3): 50 mg via ORAL
  Filled 2020-11-01 (×3): qty 1

## 2020-11-01 MED ORDER — ONDANSETRON 4 MG PO TBDP: 4.0000 mg | ORAL_TABLET | Freq: Three times a day (TID) | ORAL | Status: DC | PRN

## 2020-11-01 MED ORDER — FERROUS SULFATE 325 (65 FE) MG PO TABS
650.0000 mg | ORAL_TABLET | Freq: Every day | ORAL | Status: DC
  Administered 2020-11-02 – 2020-11-06 (×5): 650 mg via ORAL
  Filled 2020-11-01 (×5): qty 2
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
## 2020-11-01 NOTE — ED Notes (Addendum)
Error in charting.
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
## 2020-11-01 NOTE — ED Notes (Signed)
Hospital meal provided.  100% consumed, pt tolerated w/o complaints.  Waste discarded appropriately.   

## 2020-11-01 NOTE — ED Notes (Signed)
IVC/Consult completed/Observe overnight re-assess in Am  

## 2020-11-01 NOTE — ED Notes (Signed)
Report to [place], [place]

## 2020-11-01 NOTE — ED Notes (Signed)
Unlocked bathroom door to allow patient to use restroom. Pt exited and bathroom door locked behind patient by staff.  

## 2020-11-01 NOTE — Plan of Care (Signed)
  Problem: Education: Goal: Emotional status will improve Outcome: Progressing Goal: Mental status will improve Outcome: Progressing   Problem: Activity: Goal: Interest or engagement in activities will improve Outcome: Progressing   Problem: Coping: Goal: Ability to verbalize frustrations and anger appropriately will improve Outcome: Progressing Goal: Ability to demonstrate self-control will improve Outcome: Progressing   
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
## 2020-11-01 NOTE — BH Assessment (Signed)
Patient is to be admitted to [facility] by Psychiatric Nurse Practitioner  Valetta Mole .  Attending Physician will be Dr. Neale Burly.   Patient has been assigned to room 325, by [facility] Charge Nurse Geraldo Docker   ER staff is aware of the admission: Dr. Fanny Bien, ER MD  Hassan Buckler., Patient's Nurse  Sue Lush, Patient Access.

## 2020-11-01 NOTE — Progress Notes (Signed)
Patient pleasant and cooperative. Denies SI, HI, AVH. In bed resting most of shift. Did get up around 9pm thinking it was morning. Reports she heard someone say vital signs. Reports she must have been dreaming. Pt request prn for anxiety. Given with good relief. No other concerns or complaints voiced. Pt states she is tired and went back to bed. Pt currently resting, eyes closed in no distress.

## 2020-11-01 NOTE — Tx Team (Signed)
Initial Treatment Plan 11/01/2020 4:04 PM Kirsten Richardson [id number]    PATIENT STRESSORS: Marital or family conflict   Medication change or noncompliance     PATIENT STRENGTHS: Communication skills  Motivation for treatment/growth    PATIENT IDENTIFIED PROBLEMS: Ineffective coping skills  Non-medication compliance                    DISCHARGE CRITERIA:  Improved stabilization in mood, thinking, and/or behavior Motivation to continue treatment in a less acute level of care  PRELIMINARY DISCHARGE PLAN: Attend aftercare/continuing care group Outpatient therapy  PATIENT/FAMILY INVOLVEMENT: This treatment plan has been presented to and reviewed with the patient, Kirsten Richardson, and/or family member.  The patient and family have been given the opportunity to ask questions and make suggestions.  Sharin Mons, RN 11/01/2020, 4:04 PM

## 2020-11-02 DIAGNOSIS — F315 Bipolar disorder, current episode depressed, severe, with psychotic features: Principal | ICD-10-CM

## 2020-11-02 MED ORDER — NICOTINE 21 MG/24HR TD PT24
21.0000 mg | MEDICATED_PATCH | Freq: Every day | TRANSDERMAL | Status: DC
  Administered 2020-11-02 – 2020-11-06 (×5): 21 mg via TRANSDERMAL
  Filled 2020-11-02 (×5): qty 1

## 2020-11-02 MED ORDER — DICLOFENAC SODIUM 1 % EX GEL
2.0000 g | Freq: Two times a day (BID) | CUTANEOUS | Status: DC | PRN
  Filled 2020-11-02: qty 100

## 2020-11-02 NOTE — Progress Notes (Signed)
Recreation Therapy Notes   Date: 11/02/2020  Time: 9:45 am  Location: Craft room    Behavioral response: N/A   Intervention Topic: Time Management   Discussion/Intervention: Patient did not attend group.   Clinical Observations/Feedback:  Patient did not attend group.   Marializ Ferrebee LRT/CTRS        Hollyanne Schloesser 11/02/2020 10:58 AM

## 2020-11-02 NOTE — BHH Counselor (Signed)
CSW attempted to complete PSA with patient on 2 separate occasions. Patient has remained in bed for majority of day w/ head covered.    Signed:  Corky Crafts, MSW, [place], LCASA 11/02/2020 3:27 PM

## 2020-11-02 NOTE — H&P (Signed)
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 associated with substance abuse/mental health issues will improve  I certify that inpatient services furnished can reasonably be expected to improve the patient's condition.    Jesse Sans, MD 10/28/202210:11 AM

## 2020-11-02 NOTE — Plan of Care (Signed)
Patient stays in bed most of the shift states that she is sleepy. Patient stated that her main stress now is living arrangement after discharge. Patient denies SI,HI and AVH at this time.Patient able to make a lucid conversation with staff. Appetite good. Support and encouragement given.

## 2020-11-02 NOTE — BH IP Treatment Plan (Signed)
Interdisciplinary Treatment and Diagnostic Plan Update  11/02/2020 Time of Session: 0900  Kirsten Richardson MRN: [id number]  Principal Diagnosis: Bipolar affective disorder, depressed, severe, with psychotic behavior ([place])  Secondary Diagnoses: Principal Problem:   Bipolar affective disorder, depressed, severe, with psychotic behavior ([place]) Active Problems:   Hip pain, chronic   Borderline personality disorder ([place])   Hypothyroidism due to acquired atrophy of thyroid   Essential hypertension   COPD (chronic obstructive pulmonary disease) ([place])   Current Medications:  Current Facility-Administered Medications  Medication Dose Route Frequency Provider Last Rate Last Admin   acetaminophen (TYLENOL) tablet 650 mg  650 mg Oral Q6H PRN Salley Scarlet, MD       albuterol (VENTOLIN HFA) 108 (90 Base) MCG/ACT inhaler 1-2 puff  1-2 puff Inhalation Q4H PRN Salley Scarlet, MD       alum & mag hydroxide-simeth (MAALOX/MYLANTA) 200-200-20 MG/5ML suspension 30 mL  30 mL Oral Q4H PRN Salley Scarlet, MD       atorvastatin (LIPITOR) tablet 20 mg  20 mg Oral QHS Selina Cooley M, MD   20 mg at 11/01/20 2133   diclofenac Sodium (VOLTAREN) 1 % topical gel 2 g  2 g Topical BID PRN Salley Scarlet, MD       DULoxetine (CYMBALTA) DR capsule 60 mg  60 mg Oral Daily Salley Scarlet, MD   60 mg at 11/02/20 0751   ferrous sulfate tablet 650 mg  650 mg Oral Q breakfast Salley Scarlet, MD   650 mg at 88/32/54 9826   folic acid (FOLVITE) tablet 1 mg  1 mg Oral Daily Salley Scarlet, MD   1 mg at 11/02/20 4158   hydrOXYzine (ATARAX/VISTARIL) tablet 50 mg  50 mg Oral TID PRN Salley Scarlet, MD   50 mg at 11/01/20 2133   lamoTRIgine (LAMICTAL) tablet 300 mg  300 mg Oral Daily Salley Scarlet, MD   300 mg at 11/02/20 0751   levothyroxine (SYNTHROID) tablet 75 mcg  75 mcg Oral Q0600 Salley Scarlet, MD   75 mcg at 11/02/20 3094   [START ON 11/03/2020] lubiprostone (AMITIZA) capsule 24 mcg  24 mcg Oral Azucena Kuba, MD       magnesium hydroxide (MILK OF MAGNESIA) suspension 30 mL  30 mL Oral Daily PRN Salley Scarlet, MD       methotrexate (RHEUMATREX) tablet 20 mg  20 mg Oral Q Fri Freeman, Megan M, MD       mirabegron ER [facility]) tablet 25 mg  25 mg Oral QHS Salley Scarlet, MD   25 mg at 11/01/20 2133   ondansetron (ZOFRAN-ODT) disintegrating tablet 4 mg  4 mg Oral Q8H PRN Salley Scarlet, MD       pantoprazole (PROTONIX) EC tablet 40 mg  40 mg Oral Daily Salley Scarlet, MD   40 mg at 11/02/20 0768   risperiDONE (RISPERDAL) tablet 2 mg  2 mg Oral QHS Salley Scarlet, MD   2 mg at 11/01/20 2133   PTA Medications: Medications Prior to Admission  Medication Sig Dispense Refill Last Dose   albuterol (VENTOLIN HFA) 108 (90 Base) MCG/ACT inhaler Inhale 1-2 puffs into the lungs every 4 (four) hours as needed for wheezing or shortness of breath. 1 Inhaler 1    atorvastatin (LIPITOR) 20 MG tablet Take 1 tablet (20 mg total) by mouth at bedtime. (Patient not taking: Reported on 10/16/2020) 30 tablet 1    DULoxetine (  CYMBALTA) 60 MG capsule Take 1 capsule (60 mg total) by mouth daily. 30 capsule 1    ferrous sulfate 324 MG TBEC Take 648 mg by mouth daily with breakfast.      folic acid (FOLVITE) 1 MG tablet Take 1 mg by mouth daily.      hydrOXYzine (ATARAX/VISTARIL) 50 MG tablet Take 1 tablet (50 mg total) by mouth 3 (three) times daily as needed for anxiety. 90 tablet 1    lamoTRIgine (LAMICTAL) 150 MG tablet Take 2 tablets (300 mg total) by mouth daily. 60 tablet 1    levothyroxine (SYNTHROID) 75 MCG tablet Take 1 tablet (75 mcg total) by mouth daily at 6 (six) AM. 30 tablet 1    lubiprostone (AMITIZA) 24 MCG capsule Take 24 mcg by mouth every other day.      meloxicam (MOBIC) 15 MG tablet Take 15 mg by mouth daily.      methotrexate (RHEUMATREX) 2.5 MG tablet Take 20 mg by mouth every Friday.      MYRBETRIQ 25 MG TB24 tablet Take 25 mg by mouth at bedtime.      omeprazole (PRILOSEC)  40 MG capsule Take 40 mg by mouth daily.      ondansetron (ZOFRAN ODT) 4 MG disintegrating tablet Take 1 tablet (4 mg total) by mouth every 8 (eight) hours as needed for nausea or vomiting. 20 tablet 0    risperiDONE (RISPERDAL) 2 MG tablet Take 1 tablet (2 mg total) by mouth at bedtime. 30 tablet 1     Patient Stressors: Marital or family conflict   Medication change or noncompliance    Patient Strengths: Hydrographic surveyor for treatment/growth   Treatment Modalities: Medication Management, Group therapy, Case management,  1 to 1 session with clinician, Psychoeducation, Recreational therapy.   Physician Treatment Plan for Primary Diagnosis: Bipolar affective disorder, depressed, severe, with psychotic behavior ([place]) Long Term Goal(s): Improvement in symptoms so as ready for discharge   Short Term Goals: Ability to identify changes in lifestyle to reduce recurrence of condition will improve Ability to verbalize feelings will improve Ability to disclose and discuss suicidal ideas Ability to demonstrate self-control will improve Ability to identify and develop effective coping behaviors will improve Ability to maintain clinical measurements within normal limits will improve Compliance with prescribed medications will improve Ability to identify triggers associated with substance abuse/mental health issues will improve  Medication Management: Evaluate patient's response, side effects, and tolerance of medication regimen.  Therapeutic Interventions: 1 to 1 sessions, Unit Group sessions and Medication administration.  Evaluation of Outcomes: Not Met  Physician Treatment Plan for Secondary Diagnosis: Principal Problem:   Bipolar affective disorder, depressed, severe, with psychotic behavior ([place]) Active Problems:   Hip pain, chronic   Borderline personality disorder ([place])   Hypothyroidism due to acquired atrophy of thyroid   Essential hypertension   COPD (chronic  obstructive pulmonary disease) ([place])  Long Term Goal(s): Improvement in symptoms so as ready for discharge   Short Term Goals: Ability to identify changes in lifestyle to reduce recurrence of condition will improve Ability to verbalize feelings will improve Ability to disclose and discuss suicidal ideas Ability to demonstrate self-control will improve Ability to identify and develop effective coping behaviors will improve Ability to maintain clinical measurements within normal limits will improve Compliance with prescribed medications will improve Ability to identify triggers associated with substance abuse/mental health issues will improve     Medication Management: Evaluate patient's response, side effects, and tolerance of medication regimen.  Therapeutic Interventions: 1 to 1 sessions, Unit Group sessions and Medication administration.  Evaluation of Outcomes: Not Met   RN Treatment Plan for Primary Diagnosis: Bipolar affective disorder, depressed, severe, with psychotic behavior ([place]) Long Term Goal(s): Knowledge of disease and therapeutic regimen to maintain health will improve  Short Term Goals: Ability to remain free from injury will improve, Ability to verbalize frustration and anger appropriately will improve, Ability to demonstrate self-control, Ability to participate in decision making will improve, Ability to verbalize feelings will improve, Ability to disclose and discuss suicidal ideas, Ability to identify and develop effective coping behaviors will improve, and Compliance with prescribed medications will improve  Medication Management: RN will administer medications as ordered by provider, will assess and evaluate patient's response and provide education to patient for prescribed medication. RN will report any adverse and/or side effects to prescribing provider.  Therapeutic Interventions: 1 on 1 counseling sessions, Psychoeducation, Medication administration, Evaluate  responses to treatment, Monitor vital signs and CBGs as ordered, Perform/monitor CIWA, COWS, AIMS and Fall Risk screenings as ordered, Perform wound care treatments as ordered.  Evaluation of Outcomes: Not Met   LCSW Treatment Plan for Primary Diagnosis: Bipolar affective disorder, depressed, severe, with psychotic behavior ([place]) Long Term Goal(s): Safe transition to appropriate next level of care at discharge, Engage patient in therapeutic group addressing interpersonal concerns.  Short Term Goals: Engage patient in aftercare planning with referrals and resources, Increase social support, Increase ability to appropriately verbalize feelings, Increase emotional regulation, Facilitate acceptance of mental health diagnosis and concerns, Facilitate patient progression through stages of change regarding substance use diagnoses and concerns, Identify triggers associated with mental health/substance abuse issues, and Increase skills for wellness and recovery  Therapeutic Interventions: Assess for all discharge needs, 1 to 1 time with Social worker, Explore available resources and support systems, Assess for adequacy in community support network, Educate family and significant other(s) on suicide prevention, Complete Psychosocial Assessment, Interpersonal group therapy.  Evaluation of Outcomes: Not Met   Progress in Treatment: Attending groups: No. Participating in groups: No. Taking medication as prescribed: Yes. Toleration medication: Yes. Family/Significant other contact made: No, will contact:  CSW will obtain consent to reach collateral contact  Patient understands diagnosis: No. Discussing patient identified problems/goals with staff: Yes. Medical problems stabilized or resolved: Yes. Denies suicidal/homicidal ideation: Yes. Issues/concerns per patient self-inventory: Yes. Other: none   New problem(s) identified: Yes, Describe:  Patient reports issues with housing at this time.   New  Short Term/Long Term Goal(s): Patient to work towards elimination of symptoms of psychosis, medication management for mood stabilization; elimination of SI thoughts; development of comprehensive mental wellness plan.  Patient Goals: "Find some where that is more permanent than where I am at now."    Discharge Plan or Barriers: Patient reports inadequate housing.   Reason for Continuation of Hospitalization: Delusions   Estimated Length of Stay: 1-7 days      Scribe for Treatment Team: Larose Kells 11/02/2020 10:42 AM

## 2020-11-02 NOTE — Progress Notes (Signed)
Recreation Therapy Notes  INPATIENT RECREATION TR PLAN  Patient Details Name: Kirsten Richardson MRN: [id number] DOB: 06-09-1975 Today's Date: 11/02/2020  Rec Therapy Plan Is patient appropriate for Therapeutic Recreation?: Yes Treatment times per week: at least 3 Estimated Length of Stay: 5-7 days TR Treatment/Interventions: Group participation (Comment)  Discharge Criteria Pt will be discharged from therapy if:: Discharged Treatment plan/goals/alternatives discussed and agreed upon by:: Patient/family  Discharge Summary     Kirsten Richardson 11/02/2020, 11:20 AM

## 2020-11-02 NOTE — BHH Counselor (Signed)
DSS social worker requested to speak with patient related to ongoing case management.    CSW met patient and explained the situation. CSW made it explicitly clear that the she has the option to decline contact with DSS while hospitalized and that if she chooses to meet with DSS, she has the option to leave meeting at any time. Patient agreed to meet with DSS social worker who obtained update on patient's psychosocial stressors, reason for most recent hospitalization, follow up appointments, and medications. Patient cooperated thoroughly.   No further action.   Signed:  Durenda Hurt, MSW, [place], LCASA 11/02/2020 2:55 PM
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
## 2020-11-02 NOTE — Progress Notes (Signed)
Recreation Therapy Notes  INPATIENT RECREATION THERAPY ASSESSMENT  Patient Details Name: KAELEA GATHRIGHT MRN: [id number] DOB: 1975-03-07 Today's Date: 11/02/2020       Information Obtained From: Patient  Able to Participate in Assessment/Interview: Yes  Patient Presentation: Responsive  Reason for Admission (Per Patient): Active Symptoms, Med Non-Compliance  Patient Stressors:    Coping Skills:   Doctor, hospital, Read  Leisure Interests (2+):   (Going to church)  Frequency of Recreation/Participation: Weekly  Awareness of Community Resources:  Yes  Community Resources:  [employer]  Current Use: Yes  If no, Barriers?:    Expressed Interest in [employer] Information: Yes  [place]idence[place]  Patient Main Form of Transportation: Walk  Patient Strengths:  Honest  Patient Identified Areas of Improvement:  Wisdom  Patient Goal for Hospitalization:  Get medication right  Current SI (including self-harm):  No  Current HI:  No  Current AVH: No  Staff Intervention Plan: Group Attendance, Collaborate with Interdisciplinary Treatment Team  Consent to Intern Participation: N/A  Shaterica Mcclatchy 11/02/2020, 11:19 AM

## 2020-11-03 LAB — URINALYSIS, ROUTINE W REFLEX MICROSCOPIC
Bilirubin Urine: NEGATIVE
Glucose, UA: NEGATIVE mg/dL
Ketones, ur: NEGATIVE mg/dL
Leukocytes,Ua: NEGATIVE
Nitrite: NEGATIVE
Protein, ur: NEGATIVE mg/dL
RBC / HPF: >50 RBC/hpf — ABNORMAL HIGH (ref 0–5)
Specific Gravity, Urine: 1.004 — ABNORMAL LOW (ref 1.005–1.030)
pH: 6 (ref 5.0–8.0)

## 2020-11-03 NOTE — Plan of Care (Signed)
  Problem: Education: Goal: Knowledge of [place] [facility] Education information/materials will improve Outcome: Progressing Goal: Emotional status will improve Outcome: Progressing Goal: Mental status will improve Outcome: Progressing Goal: Verbalization of understanding the information provided will improve Outcome: Progressing   Problem: Activity: Goal: Interest or engagement in activities will improve Outcome: Progressing Goal: Sleeping patterns will improve Outcome: Progressing   Problem: Coping: Goal: Ability to verbalize frustrations and anger appropriately will improve Outcome: Progressing Goal: Ability to demonstrate self-control will improve Outcome: Progressing   Problem: Health Behavior/Discharge Planning: Goal: Identification of resources available to assist in meeting health care needs will improve Outcome: Progressing Goal: Compliance with treatment plan for underlying cause of condition will improve Outcome: Progressing   Problem: Physical Regulation: Goal: Ability to maintain clinical measurements within normal limits will improve Outcome: Progressing   

## 2020-11-03 NOTE — Progress Notes (Signed)
Patient alert and oriented x 4, affect is blunted, she brightens upon approach, no distress noted she denies SI/HI/AVH interacting appropriately with peers and staff, she's complaint with medication. 15 minutes safety checks maintained will continue to monitor

## 2020-11-03 NOTE — Group Note (Signed)
LCSW Group Therapy Note  Group Date: 11/03/2020 Start Time: 1315 End Time: 1415   Type of Therapy and Topic:  Group Therapy - Healthy vs Unhealthy Coping Skills  Participation Level:  Did Not Attend   Description of Group The focus of this group was to determine what unhealthy coping techniques typically are used by group members and what healthy coping techniques would be helpful in coping with various problems. Patients were guided in becoming aware of the differences between healthy and unhealthy coping techniques. Patients were asked to identify 2-3 healthy coping skills they would like to learn to use more effectively.  Therapeutic Goals Patients learned that coping is what human beings do all day long to deal with various situations in their lives Patients defined and discussed healthy vs unhealthy coping techniques Patients identified their preferred coping techniques and identified whether these were healthy or unhealthy Patients determined 2-3 healthy coping skills they would like to become more familiar with and use more often. Patients provided support and ideas to each other   Summary of Patient Progress: Patient did not attend group despite encouraged participation.   Therapeutic Modalities Cognitive Behavioral Therapy Motivational Interviewing  Norberto Sorenson, Theresia Majors 11/03/2020  4:48 PM
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
## 2020-11-03 NOTE — BHH Counselor (Signed)
CSW attempted to complete PSA with patient. Patient appeared asleep in bed with her head covered. Patient did not acknowledge CSW upon approach despite attempts to engage patient. CSW will make a second attempt at a later time.   Kirsten Richardson, MSW, [place], [place] 11/03/2020 10:28AM

## 2020-11-03 NOTE — Progress Notes (Signed)
Patient awake and alert today. Eating all meals and present on milieu. Denies SI/HI/AH/VH. Rates depression 8/10 and anxiety 9/10. Encores ankle and back 7/10 pain . Rates pain goal as a 5/10. Performs hygiene without any prompting. Takes medications as prescribed. No adverse reactions to medications noted. No s/s of distress. Cont Q 15 minute check for safety.

## 2020-11-04 LAB — URINALYSIS, COMPLETE (UACMP) WITH MICROSCOPIC
Bacteria, UA: NONE SEEN
Bilirubin Urine: NEGATIVE
Glucose, UA: NEGATIVE mg/dL
Hgb urine dipstick: NEGATIVE
Ketones, ur: NEGATIVE mg/dL
Leukocytes,Ua: NEGATIVE
Nitrite: NEGATIVE
Protein, ur: NEGATIVE mg/dL
Specific Gravity, Urine: 1.005 (ref 1.005–1.030)
pH: 6 (ref 5.0–8.0)

## 2020-11-04 MED ORDER — IBUPROFEN 600 MG PO TABS
600.0000 mg | ORAL_TABLET | Freq: Four times a day (QID) | ORAL | Status: DC | PRN
  Administered 2020-11-05 – 2020-11-06 (×3): 600 mg via ORAL
  Filled 2020-11-04 (×3): qty 1

## 2020-11-04 NOTE — Group Note (Signed)
LCSW Group Therapy Note  Group Date: 11/04/2020 Start Time: 1310 End Time: 1410   Type of Therapy and Topic:  Group Therapy - How To Cope with Nervousness about Discharge   Participation Level:  Active   Description of Group This process group involved identification of patients' feelings about discharge. Some of them are scheduled to be discharged soon, while others are new admissions, but each of them was asked to share thoughts and feelings surrounding discharge from the hospital. One common theme was that they are excited at the prospect of going home, while another was that many of them are apprehensive about sharing why they were hospitalized. Patients were given the opportunity to discuss these feelings with their peers in preparation for discharge.  Therapeutic Goals  Patient will identify their overall feelings about pending discharge. Patient will think about how they might proactively address issues that they believe will once again arise once they get home (i.e. with parents). Patients will participate in discussion about having hope for change.   Summary of Patient Progress:  Patient was present for the entirety of the group session. Patient was an active listener and participated in the topic of discussion. Patient presented with a flat affect. Patient expressed anxiety regarding discharge as she shared that she is unsure of where she will live. Patient shared that her utilities are not currently working in her home and she feels stressed about the steps she will need to take to move. Patient stated she plans to be compliant with her medications upon hospital discharge.     Therapeutic Modalities Cognitive Behavioral Therapy   Marletta Lor 11/04/2020  6:43 PM

## 2020-11-04 NOTE — [facility] Suicide Risk Assessment (Signed)
[facility] INPATIENT:  Family/Significant Other Suicide Prevention Education  Suicide Prevention Education:  Education Completed; Lilli Few (friend), [phone] has been identified by the patient as the family member/significant other with whom the patient will be residing, and identified as the person(s) who will aid the patient in the event of a mental health crisis (suicidal ideations/suicide attempt).  With written consent from the patient, the family member/significant other has been provided the following suicide prevention education, prior to the and/or following the discharge of the patient.  The suicide prevention education provided includes the following: Suicide risk factors Suicide prevention and interventions National Suicide Hotline telephone number [facility] assessment telephone number [facility] Emergency Assistance 911  [facility] and/or Residential Mobile Crisis Unit telephone number  Request made of family/significant other to: Remove weapons (e.g., guns, rifles, knives), all items previously/currently identified as safety concern.   Remove drugs/medications (over-the-counter, prescriptions, illicit drugs), all items previously/currently identified as a safety concern.  The family member/significant other verbalizes understanding of the suicide prevention education information provided.  The family member/significant other agrees to remove the items of safety concern listed above.  ALovell Sheehan states that she does not feel that patient is suicidal and believes that patient "just does not feel safe" when she is experiencing delusions that someone is "after her". However, patient reports contradicting information during PSA - patient reports that she attempted suicide via drug overdose of oxycodone, suboxone, tramadol, and other medications of unknown amounts on 10/30/20. It does not appear that A. Lovell Sheehan is aware of this incident.  ALovell Sheehan states  patient does not have weapons or drugs/medications that pose as a safety concern in patient's home or possession.   Ileana Ladd Meron Bocchino 11/04/2020, 11:21 AM

## 2020-11-04 NOTE — Progress Notes (Signed)
Patient awake this morning but complains of being extremely tired. States, "Risperdal dose messed up my sleep. It makes me sleep too long and too much but I don't know what they will do because I have to have it. Patient resting in bed off and on today and spends some time on the milieu. Denies SI/HI/AH/VH. Endorses  anxiety 3/10 and depression 4/10 as well as  pain to left ankle that is an 8/10. Reports pain is worse when standing. Request Ibuprofen but none present at time. Patient encouraged to rest leg and staff off of it. Participates in groups and interacts minimally with staff and peers. No adverse reactions noted. Cont Q15 minute check for safety.
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
## 2020-11-04 NOTE — Progress Notes (Signed)
Patient alert and oriented x 4, affect is blunted, she brightens upon approach, no distress noted she denies SI/HI/AVH interacting appropriately with peers and staff, she's complaint with medication. She is receptive to staff, complaint with medication. 15 minutes safety checks maintained will continue to monitor

## 2020-11-04 NOTE — Plan of Care (Signed)
  Problem: Education: Goal: Knowledge of [place] [facility] Education information/materials will improve Outcome: Progressing Goal: Emotional status will improve Outcome: Progressing Goal: Mental status will improve Outcome: Progressing Goal: Verbalization of understanding the information provided will improve Outcome: Progressing   Problem: Activity: Goal: Interest or engagement in activities will improve Outcome: Progressing Goal: Sleeping patterns will improve Outcome: Progressing   Problem: Coping: Goal: Ability to verbalize frustrations and anger appropriately will improve Outcome: Progressing Goal: Ability to demonstrate self-control will improve Outcome: Progressing   Problem: Health Behavior/Discharge Planning: Goal: Identification of resources available to assist in meeting health care needs will improve Outcome: Progressing Goal: Compliance with treatment plan for underlying cause of condition will improve Outcome: Progressing   Problem: Physical Regulation: Goal: Ability to maintain clinical measurements within normal limits will improve Outcome: Progressing   Problem: Safety: Goal: Periods of time without injury will increase Outcome: Progressing   Problem: Activity: Goal: Will verbalize the importance of balancing activity with adequate rest periods Outcome: Progressing   Problem: Education: Goal: Will be free of psychotic symptoms Outcome: Progressing Goal: Knowledge of the prescribed therapeutic regimen will improve Outcome: Progressing

## 2020-11-05 LAB — POTASSIUM: Potassium: 4.3 mmol/L (ref 3.5–5.1)

## 2020-11-05 MED ORDER — RISPERIDONE 1 MG PO TABS
2.0000 mg | ORAL_TABLET | Freq: Every evening | ORAL | Status: DC
Start: 2020-11-05 — End: 2020-11-06
  Administered 2020-11-05: 2 mg via ORAL
  Filled 2020-11-05: qty 2

## 2020-11-05 NOTE — Progress Notes (Signed)
Patient resting in bed throughout the day. Only up for lunch and dinner. Denies SI/HI/AH/VH anxiety and depression. Rates pain 8/10 to L ankle and 6/10 to R shoulder.   Reports missing breakfast due to difficulty waking up. States, "This Risperdal has me sleeping. I told my doctor. ""But I got to keep taking it."   Patient takes all medication as prescribed. Encouraged to verbalize needs. All needs addressed. Will cont Q15 minute check for safety.

## 2020-11-05 NOTE — Group Note (Signed)
[facility] LCSW Group Therapy Note    Group Date: 11/05/2020 Start Time: 1300 End Time: 1400  Type of Therapy and Topic:  Group Therapy:  Overcoming Obstacles  Participation Level:  [facility] PARTICIPATION LEVEL: Did Not Attend  Mood:  Description of Group:   In this group patients will be encouraged to explore what they see as obstacles to their own wellness and recovery. They will be guided to discuss their thoughts, feelings, and behaviors related to these obstacles. The group will process together ways to cope with barriers, with attention given to specific choices patients can make. Each patient will be challenged to identify changes they are motivated to make in order to overcome their obstacles. This group will be process-oriented, with patients participating in exploration of their own experiences as well as giving and receiving support and challenge from other group members.  Therapeutic Goals: 1. Patient will identify personal and current obstacles as they relate to admission. 2. Patient will identify barriers that currently interfere with their wellness or overcoming obstacles.  3. Patient will identify feelings, thought process and behaviors related to these barriers. 4. Patient will identify two changes they are willing to make to overcome these obstacles:    Summary of Patient Progress Patient did not attend group despite encouraged participation.    Therapeutic Modalities:   Cognitive Behavioral Therapy Solution Focused Therapy Motivational Interviewing Relapse Prevention Therapy   Corky Crafts, [place]
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
## 2020-11-05 NOTE — Progress Notes (Signed)
Patient alert and oriented x 4, affect is blunted, her thoughts are organized and coherent, she brightens upon approach, no distress noted she denies SI/HI/AVH interacting appropriately with peers and staff, she's complaint with medication. 15 minutes safety checks maintained will continue to monitor

## 2020-11-05 NOTE — Plan of Care (Signed)
  Problem: Education: Goal: Knowledge of [facility] Education information/materials will improve Outcome: Progressing Goal: Emotional status will improve Outcome: Progressing Goal: Mental status will improve Outcome: Progressing Goal: Verbalization of understanding the information provided will improve Outcome: Progressing   Problem: Activity: Goal: Interest or engagement in activities will improve Outcome: Progressing Goal: Sleeping patterns will improve Outcome: Progressing   Problem: Coping: Goal: Ability to verbalize frustrations and anger appropriately will improve Outcome: Progressing Goal: Ability to demonstrate self-control will improve Outcome: Progressing   Problem: Physical Regulation: Goal: Ability to maintain clinical measurements within normal limits will improve Outcome: Progressing   Problem: Safety: Goal: Periods of time without injury will increase Outcome: Progressing   Problem: Activity: Goal: Will verbalize the importance of balancing activity with adequate rest periods Outcome: Progressing   Problem: Education: Goal: Will be free of psychotic symptoms Outcome: Progressing Goal: Knowledge of the prescribed therapeutic regimen will improve Outcome: Progressing   Problem: Education: Goal: Will be free of psychotic symptoms Outcome: Progressing Goal: Knowledge of the prescribed therapeutic regimen will improve Outcome: Progressing   Problem: Coping: Goal: Coping ability will improve Outcome: Progressing Goal: Will verbalize feelings Outcome: Progressing   Problem: Health Behavior/Discharge Planning: Goal: Compliance with prescribed medication regimen will improve Outcome: Progressing   Problem: Health Behavior/Discharge Planning: Goal: Compliance with prescribed medication regimen will improve Outcome: Progressing   Problem: Nutritional: Goal: Ability to achieve adequate nutritional intake will improve Outcome: Progressing    Problem: Role Relationship: Goal: Ability to communicate needs accurately will improve Outcome: Progressing Goal: Ability to interact with others will improve Outcome: Progressing   Problem: Safety: Goal: Ability to redirect hostility and anger into socially appropriate behaviors will improve Outcome: Progressing Goal: Ability to remain free from injury will improve Outcome: Progressing   Problem: Role Relationship: Goal: Ability to communicate needs accurately will improve Outcome: Progressing Goal: Ability to interact with others will improve Outcome: Progressing   Problem: Self-Care: Goal: Ability to participate in self-care as condition permits will improve Outcome: Progressing   Problem: Safety: Goal: Ability to redirect hostility and anger into socially appropriate behaviors will improve Outcome: Progressing Goal: Ability to remain free from injury will improve Outcome: Progressing

## 2020-11-05 NOTE — Progress Notes (Signed)
Recreation Therapy Notes   Date: 11/05/2020  Time: 10:00 am     Location: Craft room    Behavioral response: N/A   Intervention Topic: Stress Management    Discussion/Intervention: Patient did not attend group.   Clinical Observations/Feedback:  Patient did not attend group.   Rosemarie Galvis LRT/CTRS        Clevester Helzer 11/05/2020 12:16 PM

## 2020-11-05 NOTE — BHH Counselor (Signed)
CSW provided patient with boarding house list and encouraged to call, CSW also provided patient with bus schedule.   Signed:  Corky Crafts, MSW, [place], LCASA 11/05/2020 9:50 AM

## 2020-11-06 NOTE — Plan of Care (Signed)
  Problem: Group Participation Goal: STG - Patient will engage in groups without prompting or encouragement from LRT x3 group sessions within 5 recreation therapy group sessions Description: STG - Patient will engage in groups without prompting or encouragement from LRT x3 group sessions within 5 recreation therapy group sessions 11/06/2020 1607 by Ernest Haber, LRT Outcome: Not Applicable 36/07/2548 [id number] by Ernest Haber, LRT Outcome: Not Met (add Reason) Note: Patient did not attend any groups

## 2020-11-06 NOTE — Progress Notes (Signed)
  [facility] Adult Case Management Discharge Plan :  Will you be returning to the same living situation after discharge:  Yes,  pt reports that she is returning to her home, and plans on following up with her previous boarding house to see if she can return.  At discharge, do you have transportation home?: Yes,  CSW will assist with transportation needs.  Do you have the ability to pay for your medications: Yes,  [facility] Medicare/[employer] Medicaid.   Release of information consent forms completed and in the chart;  Patient's signature needed at discharge.  Patient to Follow up at:  Follow-up Information     [facility] Follow up.   Why: Your appointment is scheduled for 11/12/2020 at 2:30PM. Contact information: [address] [facility] [address] [phone]                 Next level of care provider has access to [facility] Link:no  Safety Planning and Suicide Prevention discussed: Yes,  SPE completed with the patient.      Has patient been referred to the Quitline?: Patient refused referral  Patient has been referred for addiction treatment: Pt. refused referral  [name], LCSW 11/06/2020, 9:36 AM

## 2020-11-06 NOTE — Plan of Care (Signed)
  Problem: Education: Goal: Emotional status will improve Outcome: Progressing   Problem: Education: Goal: Mental status will improve Outcome: Progressing   Problem: Education: Goal: Verbalization of understanding the information provided will improve Outcome: Progressing   Problem: Activity: Goal: Interest or engagement in activities will improve Outcome: Progressing Goal: Sleeping patterns will improve Outcome: Progressing   Problem: Coping: Goal: Ability to verbalize frustrations and anger appropriately will improve Outcome: Progressing Goal: Ability to demonstrate self-control will improve Outcome: Progressing

## 2020-11-06 NOTE — BHH Suicide Risk Assessment (Signed)
[facility] Discharge Suicide Risk Assessment   Principal Problem: Bipolar affective disorder, depressed, severe, with psychotic behavior (HCC) Discharge Diagnoses: Principal Problem:   Bipolar affective disorder, depressed, severe, with psychotic behavior (HCC) Active Problems:   Hip pain, chronic   Borderline personality disorder (HCC)   Hypothyroidism due to acquired atrophy of thyroid   Essential hypertension   COPD (chronic obstructive pulmonary disease) (HCC)   Total Time spent with patient: 35 minutes- 25 minutes face-to-face contact with patient, 10 minutes documentation, coordination of care, scripts   Musculoskeletal: Strength & Muscle Tone: within normal limits Gait & Station: normal Patient leans: N/A  Psychiatric Specialty Exam  Presentation  General Appearance: Appropriate for Environment; Casual  Eye Contact:Good  Speech:Clear and Coherent; Normal Rate  Speech Volume:Normal  Handedness:Right   Mood and Affect  Mood:Euthymic  Duration of Depression Symptoms: N/A  Affect:Congruent   Thought Process  Thought Processes:Coherent; Goal Directed  Descriptions of Associations:Intact  Orientation:Full (Time, Place and Person)  Thought Content:Logical  History of Schizophrenia/Schizoaffective disorder:No  Duration of Psychotic Symptoms:Greater than six months  Hallucinations:Hallucinations: None  Ideas of Reference:None  Suicidal Thoughts:Suicidal Thoughts: No  Homicidal Thoughts:Homicidal Thoughts: No   Sensorium  Memory:Immediate Good; Recent Good; Remote Good  Judgment:Intact  Insight:Present   Executive Functions  Concentration:Good  Attention Span:Good  Recall:Good  Fund of Knowledge:Good  Language:Good   Psychomotor Activity  Psychomotor Activity:Psychomotor Activity: Normal   Assets  Assets:Communication Skills; Desire for Improvement; Financial Resources/Insurance; Housing; Resilience; Social Support   Sleep   Sleep:Sleep: Good Number of Hours of Sleep: 9   Physical Exam: Physical Exam ROS Blood pressure 97/64, pulse 83, temperature 98.7 F (37.1 C), temperature source Oral, resp. rate 18, height 5' (1.524 m), weight 74.8 kg, SpO2 99 %. Body mass index is 32.21 kg/m.  Mental Status Per Nursing Assessment::   On Admission:  Self-harm thoughts  Demographic Factors:  Caucasian and Living alone  Loss Factors: NA  Historical Factors: Prior suicide attempts and Impulsivity  Risk Reduction Factors:   Religious beliefs about death, Positive social support, Positive therapeutic relationship, and Positive coping skills or problem solving skills  Continued Clinical Symptoms:  Bipolar Disorder:   Depressive phase Previous Psychiatric Diagnoses and Treatments Medical Diagnoses and Treatments/Surgeries  Cognitive Features That Contribute To Risk:  None    Suicide Risk:  Minimal: No identifiable suicidal ideation.  Patients presenting with no risk factors but with morbid ruminations; may be classified as minimal risk based on the severity of the depressive symptoms    Plan Of Care/Follow-up recommendations:  Activity:  as tolerated Diet:  low sodium heart healthy diet  Jesse Sans, MD 11/06/2020, 8:58 AM

## 2020-11-06 NOTE — BHH Counselor (Signed)
CSW received call from [facility] representative who reports that she was scheduled for an interview with the patient at 1:00PM.    CSW informed that patient was discharged from the hospital earlier in the day.  CSW was informed that the patient has provided the address of [address] [place], [place], [place] [address].  [employer] representative reported that this address does not have "running water, electricity or heat". She requested that CSW "update our records".    Penni Homans, MSW, LCSW 11/06/2020 3:07 PM

## 2020-11-06 NOTE — Progress Notes (Signed)
Recreation Therapy Notes  INPATIENT RECREATION TR PLAN  Patient Details Name: Kirsten Richardson MRN: [id number] DOB: 10-20-1975 Today's Date: 11/06/2020  Rec Therapy Plan Is patient appropriate for Therapeutic Recreation?: Yes Treatment times per week: at least 3 Estimated Length of Stay: 5-7 days TR Treatment/Interventions: Group participation (Comment)  Discharge Criteria Pt will be discharged from therapy if:: Discharged Treatment plan/goals/alternatives discussed and agreed upon by:: Patient/family  Discharge Summary Short term goals set: Patient will engage in groups without prompting or encouragement from LRT x3 group sessions within 5 recreation therapy group sessions Short term goals met: Not met Reason goals not met: Patient did not attend any groups. Therapeutic equipment acquired: N/A Reason patient discharged from therapy: Discharge from hospital Pt/family agrees with progress & goals achieved: Yes Date patient discharged from therapy: 11/06/20   Javiana Anwar 11/06/2020, 4:09 PM

## 2020-11-06 NOTE — Progress Notes (Signed)
Discharge Note Patient alert and oriented this shift. Prepared for discharge today. Denies SI/HI/AH/VH anxiety and depression. Rates pain 6/10. See MAR and pain assessment for details.  Discharge instructions reviewed (AVS, BH Transition Record and Suicide Risk assessment)  with patient and patient and patient verbalized understanding. No medication changes made by provider.     All belongings returned to patient. Items present in belongings included 71 SL suboxone. Medications counted and confirmed with RN-Skylee Cooper and by patient.   Patient discharged to a friends home. No s/s of distress at discharge.

## 2020-11-06 NOTE — Progress Notes (Signed)
Patient has been isolative to her room. She is pleasant and cooperative and easy to engage in conversation. She denies si/hi/avh anxiety and depression at this encounter. She has been med compliant and received all of her med without incident. Encouraged her to seek nursing staff with any concerns.  Will continue to monitor with Q 15 minute safety checks.    Cleo Butler-Nicholson, LPN

## 2020-11-06 NOTE — Group Note (Signed)
[facility] LCSW Group Therapy Note   Group Date: 11/06/2020 Start Time: 1300 End Time: 1400  Type of Therapy/Topic:  Group Therapy:  Feelings about Diagnosis  Participation Level:  Did Not Attend   Description of Group:    This group will allow patients to explore their thoughts and feelings about diagnoses they have received. Patients will be guided to explore their level of understanding and acceptance of these diagnoses. Facilitator will encourage patients to process their thoughts and feelings about the reactions of others to their diagnosis, and will guide patients in identifying ways to discuss their diagnosis with significant others in their lives. This group will be process-oriented, with patients participating in exploration of their own experiences as well as giving and receiving support and challenge from other group members.   Therapeutic Goals: 1. Patient will demonstrate understanding of diagnosis as evidence by identifying two or more symptoms of the disorder:  2. Patient will be able to express two feelings regarding the diagnosis 3. Patient will demonstrate ability to communicate their needs through discussion and/or role plays  Summary of Patient Progress: CSW offered group, however, patient declined to attend.   Therapeutic Modalities:   Cognitive Behavioral Therapy Brief Therapy Feelings Identification    Dekota Shenk J Izel Eisenhardt, LCSW 

## 2020-11-06 NOTE — Progress Notes (Signed)
Recreation Therapy Notes   Date: 11/06/2020  Time: 10:15 am   Location: Craft room    Behavioral response: N/A   Intervention Topic: Goals    Discussion/Intervention: Patient did not attend group.   Clinical Observations/Feedback:  Patient did not attend group.   Charliegh Vasudevan LRT/CTRS        Miyonna Ormiston 11/06/2020 12:48 PM
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
## 2020-11-06 NOTE — Progress Notes (Signed)
Patient alert and oriented this shift. Denies SI/HI/AH/VH, depression and anxiety. Rates pain 6/10 to L ankle and shoulder. Pain medication given. See MAR.   Takes all medication as prescribed. No adverse reactions noted.   Supported emotionally and encouraged to verbalize concerns.   Cont Q15 minute check for safety.

## 2020-11-06 NOTE — Plan of Care (Addendum)
Patient preparing for discharge today.  Problem: Education: Goal: Knowledge of [facility] Education information/materials will improve Outcome: Adequate for Discharge Goal: Emotional status will improve Outcome: Adequate for Discharge Goal: Mental status will improve Outcome: Adequate for Discharge Goal: Verbalization of understanding the information provided will improve Outcome: Adequate for Discharge   Problem: Activity: Goal: Interest or engagement in activities will improve Outcome: Adequate for Discharge Goal: Sleeping patterns will improve Outcome: Adequate for Discharge   Problem: Coping: Goal: Ability to verbalize frustrations and anger appropriately will improve Outcome: Adequate for Discharge Goal: Ability to demonstrate self-control will improve Outcome: Adequate for Discharge   Problem: Health Behavior/Discharge Planning: Goal: Identification of resources available to assist in meeting health care needs will improve Outcome: Adequate for Discharge Goal: Compliance with treatment plan for underlying cause of condition will improve Outcome: Adequate for Discharge   Problem: Physical Regulation: Goal: Ability to maintain clinical measurements within normal limits will improve Outcome: Adequate for Discharge   Problem: Safety: Goal: Periods of time without injury will increase Outcome: Adequate for Discharge   Problem: Activity: Goal: Will verbalize the importance of balancing activity with adequate rest periods Outcome: Adequate for Discharge   Problem: Education: Goal: Will be free of psychotic symptoms Outcome: Adequate for Discharge Goal: Knowledge of the prescribed therapeutic regimen will improve Outcome: Adequate for Discharge   Problem: Coping: Goal: Coping ability will improve Outcome: Adequate for Discharge Goal: Will verbalize feelings Outcome: Adequate for Discharge   Problem: Coping: Goal: Coping ability will improve Outcome:  Adequate for Discharge Goal: Will verbalize feelings Outcome: Adequate for Discharge   Problem: Health Behavior/Discharge Planning: Goal: Compliance with prescribed medication regimen will improve Outcome: Adequate for Discharge   Problem: Nutritional: Goal: Ability to achieve adequate nutritional intake will improve Outcome: Adequate for Discharge   Problem: Role Relationship: Goal: Ability to communicate needs accurately will improve Outcome: Adequate for Discharge Goal: Ability to interact with others will improve Outcome: Adequate for Discharge   Problem: Nutritional: Goal: Ability to achieve adequate nutritional intake will improve Outcome: Adequate for Discharge   Problem: Safety: Goal: Ability to redirect hostility and anger into socially appropriate behaviors will improve Outcome: Adequate for Discharge Goal: Ability to remain free from injury will improve Outcome: Adequate for Discharge   Problem: Self-Care: Goal: Ability to participate in self-care as condition permits will improve Outcome: Adequate for Discharge   Problem: Self-Concept: Goal: Will verbalize positive feelings about self Outcome: Adequate for Discharge   Problem: Self-Concept: Goal: Will verbalize positive feelings about self Outcome: Adequate for Discharge   Problem: Education: Goal: Utilization of techniques to improve thought processes will improve Outcome: Adequate for Discharge Goal: Knowledge of the prescribed therapeutic regimen will improve Outcome: Adequate for Discharge

## 2020-11-06 NOTE — Care Management Important Message (Signed)
Important Message  Patient Details  Name: Kirsten Richardson MRN: [id number] Date of Birth: 10/04/1975   Medicare Important Message Given:  Yes   CSW reviewed appeal process with the patient. Patient reports that she will not appeal. CSW explained that it is the patient's choice.     [name], LCSW 11/06/2020, 10:08 AM

## 2020-11-18 ENCOUNTER — Emergency Department (EMERGENCY_DEPARTMENT_HOSPITAL)
Admission: EM | Admit: 2020-11-18 | Discharge: 2020-11-20 | Disposition: A | Discharge status: Home / Self Care | Payer: Medicare Other | Source: Home / Self Care | Attending: Emergency Medicine | Admitting: Emergency Medicine

## 2020-11-18 ENCOUNTER — Other Ambulatory Visit: Payer: Self-pay

## 2020-11-18 DIAGNOSIS — Z20822 Contact with and (suspected) exposure to covid-19: Secondary | ICD-10-CM | POA: Insufficient documentation

## 2020-11-18 DIAGNOSIS — Z79899 Other long term (current) drug therapy: Secondary | ICD-10-CM | POA: Insufficient documentation

## 2020-11-18 DIAGNOSIS — F603 Borderline personality disorder: Secondary | ICD-10-CM | POA: Insufficient documentation

## 2020-11-18 DIAGNOSIS — N3000 Acute cystitis without hematuria: Secondary | ICD-10-CM

## 2020-11-18 DIAGNOSIS — F3160 Bipolar disorder, current episode mixed, unspecified: Secondary | ICD-10-CM | POA: Diagnosis not present

## 2020-11-18 DIAGNOSIS — E039 Hypothyroidism, unspecified: Secondary | ICD-10-CM | POA: Insufficient documentation

## 2020-11-18 DIAGNOSIS — J449 Chronic obstructive pulmonary disease, unspecified: Secondary | ICD-10-CM | POA: Insufficient documentation

## 2020-11-18 DIAGNOSIS — I10 Essential (primary) hypertension: Secondary | ICD-10-CM | POA: Insufficient documentation

## 2020-11-18 DIAGNOSIS — Z87891 Personal history of nicotine dependence: Secondary | ICD-10-CM | POA: Insufficient documentation

## 2020-11-18 DIAGNOSIS — F22 Delusional disorders: Secondary | ICD-10-CM | POA: Diagnosis not present

## 2020-11-18 DIAGNOSIS — F319 Bipolar disorder, unspecified: Secondary | ICD-10-CM | POA: Insufficient documentation

## 2020-11-18 LAB — COMPREHENSIVE METABOLIC PANEL
ALT: 21 U/L (ref 0–44)
AST: 33 U/L (ref 15–41)
Albumin: 4.2 g/dL (ref 3.5–5.0)
Alkaline Phosphatase: 77 U/L (ref 38–126)
Anion gap: 6 (ref 5–15)
BUN: 7 mg/dL (ref 6–20)
CO2: 26 mmol/L (ref 22–32)
Calcium: 9.4 mg/dL (ref 8.9–10.3)
Chloride: 105 mmol/L (ref 98–111)
Creatinine, Ser: 0.75 mg/dL (ref 0.44–1.00)
GFR, Estimated: >60 mL/min (ref >60)
Glucose, Bld: 81 mg/dL (ref 70–99)
Potassium: 3.9 mmol/L (ref 3.5–5.1)
Sodium: 137 mmol/L (ref 135–145)
Total Bilirubin: 0.9 mg/dL (ref 0.3–1.2)
Total Protein: 6.8 g/dL (ref 6.5–8.1)

## 2020-11-18 LAB — RESP PANEL BY RT-PCR (FLU A&B, COVID) ARPGX2
Influenza A by PCR: NEGATIVE
Influenza B by PCR: NEGATIVE
SARS Coronavirus 2 by RT PCR: NEGATIVE

## 2020-11-18 LAB — URINE DRUG SCREEN, QUALITATIVE (ARMC ONLY)
Amphetamines, Ur Screen: POSITIVE — AB
Barbiturates, Ur Screen: NOT DETECTED
Benzodiazepine, Ur Scrn: NOT DETECTED
Cannabinoid 50 Ng, Ur ~~LOC~~: NOT DETECTED
Cocaine Metabolite,Ur ~~LOC~~: NOT DETECTED
MDMA (Ecstasy)Ur Screen: NOT DETECTED
Methadone Scn, Ur: NOT DETECTED
Opiate, Ur Screen: NOT DETECTED
Phencyclidine (PCP) Ur S: NOT DETECTED
Tricyclic, Ur Screen: NOT DETECTED

## 2020-11-18 LAB — CBC
HCT: 39.1 % (ref 36.0–46.0)
Hemoglobin: 13.1 g/dL (ref 12.0–15.0)
MCH: 30.8 pg (ref 26.0–34.0)
MCHC: 33.5 g/dL (ref 30.0–36.0)
MCV: 92 fL (ref 80.0–100.0)
Platelets: 302 10*3/uL (ref 150–400)
RBC: 4.25 MIL/uL (ref 3.87–5.11)
RDW: 13 % (ref 11.5–15.5)
WBC: 6.4 10*3/uL (ref 4.0–10.5)
nRBC: 0 % (ref 0.0–0.2)

## 2020-11-18 LAB — ACETAMINOPHEN LEVEL: Acetaminophen (Tylenol), Serum: <10 ug/mL — ABNORMAL LOW (ref 10–30)

## 2020-11-18 LAB — POC URINE PREG, ED: Preg Test, Ur: NEGATIVE

## 2020-11-18 LAB — SALICYLATE LEVEL: Salicylate Lvl: <7 mg/dL — ABNORMAL LOW (ref 7.0–30.0)

## 2020-11-18 LAB — ETHANOL: Alcohol, Ethyl (B): <10 mg/dL (ref <10)

## 2020-11-18 MED ORDER — DULOXETINE HCL 30 MG PO CPEP
30.0000 mg | ORAL_CAPSULE | Freq: Every day | ORAL | Status: DC
  Administered 2020-11-18 – 2020-11-20 (×3): 30 mg via ORAL
  Filled 2020-11-18 (×3): qty 1

## 2020-11-18 MED ORDER — HYDROXYZINE HCL 25 MG PO TABS
50.0000 mg | ORAL_TABLET | Freq: Three times a day (TID) | ORAL | Status: DC | PRN
  Administered 2020-11-18: 50 mg via ORAL
  Filled 2020-11-18: qty 2

## 2020-11-18 MED ORDER — LAMOTRIGINE 100 MG PO TABS
200.0000 mg | ORAL_TABLET | Freq: Every day | ORAL | Status: DC
  Administered 2020-11-18 – 2020-11-20 (×3): 200 mg via ORAL
  Filled 2020-11-18 (×3): qty 2

## 2020-11-18 MED ORDER — RISPERIDONE 1 MG PO TABS
2.0000 mg | ORAL_TABLET | Freq: Every day | ORAL | Status: DC
  Administered 2020-11-18: 2 mg via ORAL
  Filled 2020-11-18: qty 2

## 2020-11-18 MED ORDER — ONDANSETRON 8 MG PO TBDP
8.0000 mg | ORAL_TABLET | Freq: Once | ORAL | Status: DC
  Filled 2020-11-18: qty 2

## 2020-11-18 NOTE — ED Notes (Signed)
Unable to assess, as patient is currently in the waiting room and has not been assigned a room at this time.

## 2020-11-18 NOTE — ED Notes (Signed)
Hourly rounding reveals patient in room. No complaints, stable, in no acute distress. Q15 minute rounds and monitoring via [employer] to continue. 

## 2020-11-18 NOTE — ED Notes (Signed)
Pt dressed into burgundy scrubs- belongings to include:  1 pair blue underwear 1 pair black pain 1 black coat 1 blue long sleeve 1 purple shirt 1 red tank top 1 black flower shirt 1 pair multicolor croc-like shoes 1 pair grey socks 1 black backpack 1 black drawstring bag 1 black plastic bag 1 tan bra 1 pair yellow earrings 1 yellow and 1 grey ring
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
## 2020-11-18 NOTE — ED Triage Notes (Signed)
Pt states that she was recently released from the hospital but did not have a place to stay, a car, or a phone so she was unable to pick up her meds- pt states she has been without her meds for 2-3 days- pt states that she is starting to have feelings of paranoia build back up- pt states she felt safe here- pt states d/t having to walk everywhere her L ankle has swollen and is painful

## 2020-11-18 NOTE — ED Notes (Signed)
Report to include Situation, Background, Assessment, and Recommendations received from Amy RN. Patient alert and oriented, warm and dry, in no acute distress. Patient denies SI, HI, AVH and pain. Patient made aware of Q15 minute rounds and security cameras for their safety. Patient instructed to come to me with needs or concerns.  

## 2020-11-18 NOTE — ED Notes (Signed)
VOL  pending  consult 
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
## 2020-11-18 NOTE — ED Notes (Signed)
VOL NP did consult but not in computer  moved to [facility]

## 2020-11-19 DIAGNOSIS — F22 Delusional disorders: Secondary | ICD-10-CM | POA: Diagnosis not present

## 2020-11-19 LAB — URINALYSIS, ROUTINE W REFLEX MICROSCOPIC
Bilirubin Urine: NEGATIVE
Glucose, UA: NEGATIVE mg/dL
Hgb urine dipstick: NEGATIVE
Ketones, ur: NEGATIVE mg/dL
Leukocytes,Ua: NEGATIVE
Nitrite: POSITIVE — AB
Protein, ur: NEGATIVE mg/dL
Specific Gravity, Urine: 1.005 (ref 1.005–1.030)
pH: 5 (ref 5.0–8.0)

## 2020-11-19 MED ORDER — LUBIPROSTONE 24 MCG PO CAPS
24.0000 ug | ORAL_CAPSULE | ORAL | Status: DC
  Administered 2020-11-19: 24 ug via ORAL
  Filled 2020-11-19: qty 1

## 2020-11-19 MED ORDER — PHENAZOPYRIDINE HCL 100 MG PO TABS
95.0000 mg | ORAL_TABLET | Freq: Once | ORAL | Status: AC
  Administered 2020-11-19: 100 mg via ORAL
  Filled 2020-11-19: qty 1

## 2020-11-19 MED ORDER — NICOTINE 14 MG/24HR TD PT24
14.0000 mg | MEDICATED_PATCH | Freq: Every day | TRANSDERMAL | Status: DC
  Administered 2020-11-19 – 2020-11-20 (×2): 14 mg via TRANSDERMAL
  Filled 2020-11-19 (×2): qty 1

## 2020-11-19 MED ORDER — RISPERIDONE 1 MG PO TABS
2.0000 mg | ORAL_TABLET | Freq: Every day | ORAL | Status: DC
  Administered 2020-11-19: 2 mg via ORAL
  Filled 2020-11-19: qty 2

## 2020-11-19 MED ORDER — LEVOTHYROXINE SODIUM 75 MCG PO TABS
75.0000 ug | ORAL_TABLET | Freq: Every day | ORAL | Status: DC
  Administered 2020-11-20: 75 ug via ORAL
  Filled 2020-11-19: qty 1

## 2020-11-19 MED ORDER — CEPHALEXIN 500 MG PO CAPS
500.0000 mg | ORAL_CAPSULE | Freq: Four times a day (QID) | ORAL | Status: DC
  Administered 2020-11-19 – 2020-11-20 (×4): 500 mg via ORAL
  Filled 2020-11-19 (×7): qty 1

## 2020-11-19 MED ORDER — MIRABEGRON ER 25 MG PO TB24
25.0000 mg | ORAL_TABLET | Freq: Every day | ORAL | Status: DC
  Administered 2020-11-20 (×2): 25 mg via ORAL
  Filled 2020-11-19 (×3): qty 1

## 2020-11-19 NOTE — ED Notes (Signed)
PT  VOL °

## 2020-11-19 NOTE — ED Provider Notes (Signed)
Emergency Medicine Observation Re-evaluation Note  Kirsten Richardson is a 45 y.o. female, seen on rounds today.  Pt initially presented to the ED for complaints of Mental Health Problem She is also complaining of some burning with urination today.  Physical Exam  BP 96/68   Pulse 90   Temp 98.8 F (37.1 C) (Oral)   Resp 16   Ht 4\' 11"  (1.499 m)   Wt 65.3 kg   SpO2 96%   BMI 29.08 kg/m  Physical Exam General: no acute distress Lungs: normal effort Psych: calm  ED Course / MDM  EKG:   I have reviewed the labs performed to date as well as medications administered while in observation.  Recent changes in the last 24 hours include none. UA is concerning for cystitis.  Urine culture sent and patient started on Keflex and Pyridium. Plan  Current plan is for social work to assist w/ housing needs as not candidate for admission per psych.  is not under involuntary commitment.     Justus Memory, MD 11/19/20 [phone]

## 2020-11-19 NOTE — ED Notes (Signed)
Hourly rounding reveals patient in room. No complaints, stable, in no acute distress. Q15 minute rounds and monitoring via [employer] to continue. 
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
## 2020-11-19 NOTE — ED Notes (Signed)
Report to include Situation, Background, Assessment, and Recommendations received from [facility]. Patient alert and oriented, warm and dry, in no acute distress. Patient is sleeping with eyes closed in her room. Q 15 minute rounds and security cameras for safety.

## 2020-11-19 NOTE — ED Notes (Signed)
Pt to unit door asking to go to the bathroom. Bathroom door unlocked by this tech. Pt returned to room.

## 2020-11-19 NOTE — ED Notes (Signed)
Awakened by RN, declined breakfast at this time. States that she wants to sleep at this time. Will do vitals and medications when pt awakens.

## 2020-11-19 NOTE — BH Assessment (Signed)
Patient is under review for [facility]. Awaiting accepting details.

## 2020-11-19 NOTE — ED Notes (Signed)
Unlocked bathroom door to allow patient to use restroom. Pt exited and bathroom door locked behind patient by staff.  

## 2020-11-19 NOTE — ED Notes (Signed)
Pt given denture cream, applied by Pt, tube and lid given back to this tech.

## 2020-11-19 NOTE — ED Notes (Signed)
VS not taken, patient asleep 

## 2020-11-19 NOTE — ED Notes (Signed)
Dinner tray along with soda and water given.

## 2020-11-19 NOTE — ED Notes (Signed)
Given journal per request of patient and approval of psych NP.

## 2020-11-19 NOTE — Progress Notes (Signed)
   11/19/20 1345  Clinical Encounter Type  Visited With Patient  Visit Type Other (Comment) (Pt declined)  Spiritual Encounters  Spiritual Needs Other (Comment) (not assessed)  Chaplain Burris attempted to visit with Pt or at least make an introduction. Pt not engaging at this time. Will check in later to offer support.

## 2020-11-19 NOTE — ED Notes (Signed)
Hospital meal provided.  100% consumed, pt tolerated w/o complaints.  Waste discarded appropriately.   

## 2020-11-19 NOTE — ED Notes (Signed)
Patient went to restroom 

## 2020-11-20 ENCOUNTER — Encounter: Payer: Self-pay | Admitting: Psychiatry

## 2020-11-20 ENCOUNTER — Inpatient Hospital Stay: Admit: 2020-11-20 | Payer: Medicare Other | Source: Intra-hospital | Admitting: Psychiatry

## 2020-11-20 ENCOUNTER — Inpatient Hospital Stay
Admission: RE | Admit: 2020-11-20 | Discharge: 2020-11-22 | DRG: 885 | Disposition: A | Discharge status: Home / Self Care | Payer: Medicare Other | Source: Intra-hospital | Attending: Psychiatry | Admitting: Psychiatry

## 2020-11-20 ENCOUNTER — Other Ambulatory Visit: Payer: Self-pay

## 2020-11-20 DIAGNOSIS — Z975 Presence of (intrauterine) contraceptive device: Secondary | ICD-10-CM | POA: Diagnosis not present

## 2020-11-20 DIAGNOSIS — F3162 Bipolar disorder, current episode mixed, moderate: Secondary | ICD-10-CM | POA: Diagnosis not present

## 2020-11-20 DIAGNOSIS — K219 Gastro-esophageal reflux disease without esophagitis: Secondary | ICD-10-CM | POA: Diagnosis present

## 2020-11-20 DIAGNOSIS — M5136 Other intervertebral disc degeneration, lumbar region: Secondary | ICD-10-CM | POA: Diagnosis present

## 2020-11-20 DIAGNOSIS — M1990 Unspecified osteoarthritis, unspecified site: Secondary | ICD-10-CM | POA: Diagnosis present

## 2020-11-20 DIAGNOSIS — J449 Chronic obstructive pulmonary disease, unspecified: Secondary | ICD-10-CM | POA: Diagnosis present

## 2020-11-20 DIAGNOSIS — M51369 Other intervertebral disc degeneration, lumbar region without mention of lumbar back pain or lower extremity pain: Secondary | ICD-10-CM | POA: Diagnosis present

## 2020-11-20 DIAGNOSIS — F603 Borderline personality disorder: Secondary | ICD-10-CM | POA: Diagnosis present

## 2020-11-20 DIAGNOSIS — F17210 Nicotine dependence, cigarettes, uncomplicated: Secondary | ICD-10-CM | POA: Diagnosis present

## 2020-11-20 DIAGNOSIS — Z818 Family history of other mental and behavioral disorders: Secondary | ICD-10-CM

## 2020-11-20 DIAGNOSIS — M25559 Pain in unspecified hip: Secondary | ICD-10-CM | POA: Diagnosis present

## 2020-11-20 DIAGNOSIS — J439 Emphysema, unspecified: Secondary | ICD-10-CM | POA: Diagnosis present

## 2020-11-20 DIAGNOSIS — F3160 Bipolar disorder, current episode mixed, unspecified: Secondary | ICD-10-CM | POA: Diagnosis present

## 2020-11-20 DIAGNOSIS — Z91199 Patient's noncompliance with other medical treatment and regimen due to unspecified reason: Secondary | ICD-10-CM

## 2020-11-20 DIAGNOSIS — Z9151 Personal history of suicidal behavior: Secondary | ICD-10-CM

## 2020-11-20 DIAGNOSIS — E034 Atrophy of thyroid (acquired): Secondary | ICD-10-CM | POA: Diagnosis present

## 2020-11-20 DIAGNOSIS — Z20822 Contact with and (suspected) exposure to covid-19: Secondary | ICD-10-CM | POA: Diagnosis present

## 2020-11-20 DIAGNOSIS — Z9114 Patient's other noncompliance with medication regimen: Secondary | ICD-10-CM | POA: Diagnosis not present

## 2020-11-20 DIAGNOSIS — Z888 Allergy status to other drugs, medicaments and biological substances status: Secondary | ICD-10-CM | POA: Diagnosis not present

## 2020-11-20 DIAGNOSIS — M797 Fibromyalgia: Secondary | ICD-10-CM | POA: Diagnosis present

## 2020-11-20 DIAGNOSIS — F1510 Other stimulant abuse, uncomplicated: Secondary | ICD-10-CM | POA: Diagnosis present

## 2020-11-20 DIAGNOSIS — I10 Essential (primary) hypertension: Secondary | ICD-10-CM | POA: Diagnosis present

## 2020-11-20 DIAGNOSIS — G8929 Other chronic pain: Secondary | ICD-10-CM | POA: Diagnosis present

## 2020-11-20 DIAGNOSIS — K5900 Constipation, unspecified: Secondary | ICD-10-CM | POA: Diagnosis present

## 2020-11-20 DIAGNOSIS — F419 Anxiety disorder, unspecified: Secondary | ICD-10-CM | POA: Diagnosis present

## 2020-11-20 DIAGNOSIS — K581 Irritable bowel syndrome with constipation: Secondary | ICD-10-CM | POA: Diagnosis present

## 2020-11-20 DIAGNOSIS — L4050 Arthropathic psoriasis, unspecified: Secondary | ICD-10-CM | POA: Diagnosis present

## 2020-11-20 DIAGNOSIS — R45851 Suicidal ideations: Secondary | ICD-10-CM | POA: Diagnosis present

## 2020-11-20 DIAGNOSIS — Z79890 Hormone replacement therapy (postmenopausal): Secondary | ICD-10-CM | POA: Diagnosis not present

## 2020-11-20 DIAGNOSIS — F22 Delusional disorders: Secondary | ICD-10-CM | POA: Diagnosis not present

## 2020-11-20 MED ORDER — ONDANSETRON HCL 4 MG PO TABS
4.0000 mg | ORAL_TABLET | Freq: Two times a day (BID) | ORAL | Status: DC | PRN
  Administered 2020-11-21 – 2020-11-22 (×2): 4 mg via ORAL
  Filled 2020-11-20 (×2): qty 1

## 2020-11-20 MED ORDER — RISPERIDONE 1 MG PO TABS
1.0000 mg | ORAL_TABLET | ORAL | Status: DC
  Administered 2020-11-21 – 2020-11-22 (×2): 1 mg via ORAL
  Filled 2020-11-20 (×2): qty 1

## 2020-11-20 MED ORDER — METHOCARBAMOL 500 MG PO TABS
750.0000 mg | ORAL_TABLET | Freq: Two times a day (BID) | ORAL | Status: DC
  Administered 2020-11-20 – 2020-11-22 (×4): 750 mg via ORAL
  Filled 2020-11-20 (×4): qty 2

## 2020-11-20 MED ORDER — RISPERIDONE 1 MG PO TABS
2.0000 mg | ORAL_TABLET | Freq: Every day | ORAL | Status: DC
  Administered 2020-11-20 – 2020-11-21 (×2): 2 mg via ORAL
  Filled 2020-11-20 (×2): qty 2

## 2020-11-20 MED ORDER — HYDROXYZINE HCL 50 MG PO TABS
50.0000 mg | ORAL_TABLET | Freq: Three times a day (TID) | ORAL | Status: DC | PRN
  Administered 2020-11-20: 50 mg via ORAL
  Filled 2020-11-20: qty 1

## 2020-11-20 MED ORDER — LUBIPROSTONE 24 MCG PO CAPS
24.0000 ug | ORAL_CAPSULE | ORAL | Status: DC
  Administered 2020-11-21: 24 ug via ORAL
  Filled 2020-11-20: qty 1

## 2020-11-20 MED ORDER — DULOXETINE HCL 30 MG PO CPEP: 30.0000 mg | ORAL_CAPSULE | Freq: Every day | ORAL | Status: DC

## 2020-11-20 MED ORDER — LAMOTRIGINE 25 MG PO TABS
150.0000 mg | ORAL_TABLET | Freq: Two times a day (BID) | ORAL | Status: DC
  Administered 2020-11-20 – 2020-11-22 (×4): 150 mg via ORAL
  Filled 2020-11-20 (×4): qty 2

## 2020-11-20 MED ORDER — DULOXETINE HCL 30 MG PO CPEP
60.0000 mg | ORAL_CAPSULE | Freq: Every day | ORAL | Status: DC
  Administered 2020-11-21 – 2020-11-22 (×2): 60 mg via ORAL
  Filled 2020-11-20 (×3): qty 2

## 2020-11-20 MED ORDER — MELOXICAM 7.5 MG PO TABS
15.0000 mg | ORAL_TABLET | Freq: Every day | ORAL | Status: DC
  Administered 2020-11-20 – 2020-11-22 (×3): 15 mg via ORAL
  Filled 2020-11-20 (×3): qty 2

## 2020-11-20 MED ORDER — ACETAMINOPHEN 325 MG PO TABS: 650.0000 mg | ORAL_TABLET | Freq: Four times a day (QID) | ORAL | Status: DC | PRN

## 2020-11-20 MED ORDER — LEVOTHYROXINE SODIUM 50 MCG PO TABS
75.0000 ug | ORAL_TABLET | Freq: Every day | ORAL | Status: DC
  Administered 2020-11-21 – 2020-11-22 (×2): 75 ug via ORAL
  Filled 2020-11-20 (×2): qty 2

## 2020-11-20 MED ORDER — LAMOTRIGINE 100 MG PO TABS: 200.0000 mg | ORAL_TABLET | Freq: Every day | ORAL | Status: DC

## 2020-11-20 MED ORDER — ALUM & MAG HYDROXIDE-SIMETH 200-200-20 MG/5ML PO SUSP: 30.0000 mL | ORAL | Status: DC | PRN

## 2020-11-20 MED ORDER — MIRABEGRON ER 25 MG PO TB24
25.0000 mg | ORAL_TABLET | Freq: Every day | ORAL | Status: DC
  Administered 2020-11-20 – 2020-11-21 (×2): 25 mg via ORAL
  Filled 2020-11-20 (×3): qty 1

## 2020-11-20 MED ORDER — MAGNESIUM HYDROXIDE 400 MG/5ML PO SUSP: 30.0000 mL | Freq: Every day | ORAL | Status: DC | PRN

## 2020-11-20 MED ORDER — CEPHALEXIN 500 MG PO CAPS
500.0000 mg | ORAL_CAPSULE | Freq: Four times a day (QID) | ORAL | Status: DC
  Administered 2020-11-20 – 2020-11-22 (×7): 500 mg via ORAL
  Filled 2020-11-20 (×7): qty 1

## 2020-11-20 MED ORDER — NICOTINE 14 MG/24HR TD PT24
14.0000 mg | MEDICATED_PATCH | Freq: Every day | TRANSDERMAL | Status: DC
  Administered 2020-11-21 – 2020-11-22 (×2): 14 mg via TRANSDERMAL
  Filled 2020-11-20 (×2): qty 1

## 2020-11-20 NOTE — ED Notes (Signed)
Hourly rounding reveals patient in room. No complaints, stable, in no acute distress. Q15 minute rounds and monitoring via [employer] to continue. 
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
## 2020-11-20 NOTE — Group Note (Signed)
[facility] LCSW Group Therapy Note   Group Date: 11/20/2020 Start Time: 1310 End Time: 1420  Type of Therapy/Topic:  Group Therapy:  Feelings about Diagnosis  Participation Level:  Did Not Attend      Description of Group:    This group will allow patients to explore their thoughts and feelings about diagnoses they have received. Patients will be guided to explore their level of understanding and acceptance of these diagnoses. Facilitator will encourage patients to process their thoughts and feelings about the reactions of others to their diagnosis, and will guide patients in identifying ways to discuss their diagnosis with significant others in their lives. This group will be process-oriented, with patients participating in exploration of their own experiences as well as giving and receiving support and challenge from other group members.   Therapeutic Goals: 1. Patient will demonstrate understanding of diagnosis as evidence by identifying two or more symptoms of the disorder:  2. Patient will be able to express two feelings regarding the diagnosis 3. Patient will demonstrate ability to communicate their needs through discussion and/or role plays  Summary of Patient Progress:    x    Therapeutic Modalities:   Cognitive Behavioral Therapy Brief Therapy Feelings Identification    Kirsten Richardson, Student-Social Work 

## 2020-11-20 NOTE — Tx Team (Signed)
Initial Treatment Plan 11/20/2020 11:31 AM Kirsten Richardson YEB:[id number]    PATIENT STRESSORS: Medication change or noncompliance   Other: Conflict with family     PATIENT STRENGTHS: Ability for insight  Average or above average intelligence  Supportive family/friends    PATIENT IDENTIFIED PROBLEMS: Non compliance with medication  Negative thoughts about family                   DISCHARGE CRITERIA:  Ability to meet basic life and health needs Improved stabilization in mood, thinking, and/or behavior Safe-care adequate arrangements made Verbal commitment to aftercare and medication compliance  PRELIMINARY DISCHARGE PLAN: Return to previous living arrangement  PATIENT/FAMILY INVOLVEMENT: This treatment plan has been presented to and reviewed with the patient Kirsten Richardson.  The patient has been given the opportunity to ask questions and make suggestions.  Angeline Slim, [place] 11/20/2020, 11:31 AM

## 2020-11-20 NOTE — Progress Notes (Addendum)
Patient presents with paranoia and delusions about her sister watching her. Patient isolative to her room tonight. Pt denies SI/HI/AVH. Patient given education, support, and encouragement to be active in her treatment plan. Patient refused her night medications, pt given education, still refused. Pt being monitored Q 15 minutes for safety per unit protocol. Pt remains safe on the unit

## 2020-11-20 NOTE — BH Assessment (Signed)
Patient is to be admitted to [facility] BMU today 11/20/20 by Dr. Toni Amend.  Attending Physician will be Dr.  Toni Amend .   Patient has been assigned to room 325, by [facility] Charge Nurse, Ivonne Andrew.    ER staff is aware of the admission: N[place], ER Secretary   Dr. Sidney Ace, ER MD  Connye Burkitt, Patient's Nurse  Sue Lush, Patient Access.

## 2020-11-20 NOTE — ED Notes (Signed)
Unlocked bathroom door to allow patient to use restroom. Pt exited and bathroom door locked behind patient by staff.  Given breakfast. Offered shower, denies further complaints.
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
## 2020-11-20 NOTE — ED Notes (Signed)
Report to [place], RN 

## 2020-11-20 NOTE — Plan of Care (Signed)
  Problem: Education: Goal: Ability to state activities that reduce stress will improve Outcome: Not Progressing   Problem: Coping: Goal: Ability to identify and develop effective coping behavior will improve Outcome: Not Progressing   Problem: Self-Concept: Goal: Ability to identify factors that promote anxiety will improve Outcome: Not Progressing Goal: Level of anxiety will decrease Outcome: Not Progressing Goal: Ability to modify response to factors that promote anxiety will improve Outcome: Not Progressing   Problem: Education: Goal: Emotional status will improve Outcome: Not Progressing Goal: Mental status will improve Outcome: Not Progressing Goal: Verbalization of understanding the information provided will improve Outcome: Not Progressing   Problem: Activity: Goal: Will verbalize the importance of balancing activity with adequate rest periods Outcome: Not Progressing   Problem: Education: Goal: Will be free of psychotic symptoms Outcome: Not Progressing Goal: Knowledge of the prescribed therapeutic regimen will improve Outcome: Not Progressing   Problem: Coping: Goal: Coping ability will improve Outcome: Not Progressing Goal: Will verbalize feelings Outcome: Not Progressing   Problem: Health Behavior/Discharge Planning: Goal: Compliance with prescribed medication regimen will improve Outcome: Not Progressing   Problem: Nutritional: Goal: Ability to achieve adequate nutritional intake will improve Outcome: Not Progressing

## 2020-11-20 NOTE — ED Notes (Signed)
VOL/Pending Placement 

## 2020-11-21 LAB — LIPID PANEL
Cholesterol: 151 mg/dL (ref 0–200)
HDL: 30 mg/dL — ABNORMAL LOW (ref >40)
LDL Cholesterol: 93 mg/dL (ref 0–99)
Total CHOL/HDL Ratio: 5 RATIO
Triglycerides: 139 mg/dL (ref <150)
VLDL: 28 mg/dL (ref 0–40)

## 2020-11-21 LAB — URINE CULTURE: Culture: NO GROWTH

## 2020-11-21 LAB — HEMOGLOBIN A1C
Hgb A1c MFr Bld: 4.6 % — ABNORMAL LOW (ref 4.8–5.6)
Mean Plasma Glucose: 85.32 mg/dL

## 2020-11-21 MED ORDER — ENSURE ENLIVE PO LIQD
237.0000 mL | Freq: Three times a day (TID) | ORAL | Status: DC
  Administered 2020-11-21 – 2020-11-22 (×4): 237 mL via ORAL

## 2020-11-21 MED ORDER — ADULT MULTIVITAMIN W/MINERALS CH
1.0000 | ORAL_TABLET | Freq: Every day | ORAL | Status: DC
  Administered 2020-11-21 – 2020-11-22 (×2): 1 via ORAL
  Filled 2020-11-21 (×2): qty 1

## 2020-11-21 NOTE — Progress Notes (Signed)
Patient presents with paranoia and delusions about her sister watching her. Patient isolative to her room tonight. Pt denies SI/HI/AVH. Patient given education, support, and encouragement to be active in her treatment plan. Patient compliant with medication administration per MD orders. Pt being monitored Q 15 minutes for safety per unit protocol. Pt remains safe on the unit

## 2020-11-21 NOTE — BH IP Treatment Plan (Signed)
Interdisciplinary Treatment and Diagnostic Plan Update  11/21/2020 Time of Session: 9:00 AM Kirsten Richardson MRN: [id number]  Principal Diagnosis: Bipolar mixed affective disorder, moderate ([place])  Secondary Diagnoses: Principal Problem:   Bipolar mixed affective disorder, moderate ([facility]) Active Problems:   Hip pain, chronic   Borderline personality disorder ([place])   DDD (degenerative disc disease), lumbar   Hypothyroidism due to acquired atrophy of thyroid   Essential hypertension   COPD (chronic obstructive pulmonary disease) ([facility])   Constipation   Current Medications:  Current Facility-Administered Medications  Medication Dose Route Frequency Provider Last Rate Last Admin   acetaminophen (TYLENOL) tablet 650 mg  650 mg Oral Q6H PRN Clapacs, Madie Reno, MD       alum & mag hydroxide-simeth (MAALOX/MYLANTA) 200-200-20 MG/5ML suspension 30 mL  30 mL Oral Q4H PRN Clapacs, Madie Reno, MD       cephALEXin (KEFLEX) capsule 500 mg  500 mg Oral Q6H Clapacs, John T, MD   500 mg at 11/21/20 0618   DULoxetine (CYMBALTA) DR capsule 60 mg  60 mg Oral Daily Clapacs, Madie Reno, MD   60 mg at 11/21/20 0754   feeding supplement (ENSURE ENLIVE / ENSURE PLUS) liquid 237 mL  237 mL Oral TID BM Clapacs, John T, MD       hydrOXYzine (ATARAX/VISTARIL) tablet 50 mg  50 mg Oral TID PRN Clapacs, Madie Reno, MD       lamoTRIgine (LAMICTAL) tablet 150 mg  150 mg Oral BID Clapacs, Madie Reno, MD   150 mg at 11/21/20 0754   levothyroxine (SYNTHROID) tablet 75 mcg  75 mcg Oral Q0600 Clapacs, Madie Reno, MD   75 mcg at 11/21/20 0618   lubiprostone (AMITIZA) capsule 24 mcg  24 mcg Oral QODAY Clapacs, John T, MD       magnesium hydroxide (MILK OF MAGNESIA) suspension 30 mL  30 mL Oral Daily PRN Clapacs, Madie Reno, MD       meloxicam (MOBIC) tablet 15 mg  15 mg Oral Daily Clapacs, Madie Reno, MD   15 mg at 11/21/20 0755   methocarbamol (ROBAXIN) tablet 750 mg  750 mg Oral BID Clapacs, Madie Reno, MD   750 mg at 11/21/20 0755   mirabegron ER (MYRBETRIQ)  tablet 25 mg  25 mg Oral Daily Clapacs, Madie Reno, MD       multivitamin with minerals tablet 1 tablet  1 tablet Oral Daily Clapacs, Madie Reno, MD       nicotine (NICODERM CQ - dosed in mg/24 hours) patch 14 mg  14 mg Transdermal Daily Clapacs, Madie Reno, MD   14 mg at 11/21/20 0758   ondansetron (ZOFRAN) tablet 4 mg  4 mg Oral Q12H PRN Clapacs, Madie Reno, MD       risperiDONE (RISPERDAL) tablet 1 mg  1 mg Oral BH-q7a Clapacs, John T, MD   1 mg at 11/21/20 0755   risperiDONE (RISPERDAL) tablet 2 mg  2 mg Oral QHS Clapacs, John T, MD       PTA Medications: Medications Prior to Admission  Medication Sig Dispense Refill Last Dose   albuterol (VENTOLIN HFA) 108 (90 Base) MCG/ACT inhaler Inhale 1-2 puffs into the lungs every 4 (four) hours as needed for wheezing or shortness of breath. 1 Inhaler 1    amphetamine-dextroamphetamine (ADDERALL) 10 MG tablet Take 10 mg by mouth 2 (two) times daily.      atorvastatin (LIPITOR) 20 MG tablet Take 1 tablet (20 mg total) by mouth at bedtime. (Patient not  taking: No sig reported) 30 tablet 1    DULoxetine (CYMBALTA) 60 MG capsule Take 1 capsule (60 mg total) by mouth daily. 30 capsule 1    ferrous sulfate 324 MG TBEC Take 648 mg by mouth daily with breakfast.      folic acid (FOLVITE) 1 MG tablet Take 1 mg by mouth daily.      hydrOXYzine (ATARAX/VISTARIL) 50 MG tablet Take 1 tablet (50 mg total) by mouth 3 (three) times daily as needed for anxiety. 90 tablet 1    lamoTRIgine (LAMICTAL) 150 MG tablet Take 2 tablets (300 mg total) by mouth daily. 60 tablet 1    levothyroxine (SYNTHROID) 75 MCG tablet Take 1 tablet (75 mcg total) by mouth daily at 6 (six) AM. 30 tablet 1    lubiprostone (AMITIZA) 24 MCG capsule Take 24 mcg by mouth every other day.      meloxicam (MOBIC) 15 MG tablet Take 15 mg by mouth daily.      methotrexate (RHEUMATREX) 2.5 MG tablet Take 20 mg by mouth every Friday.      ondansetron (ZOFRAN ODT) 4 MG disintegrating tablet Take 1 tablet (4 mg total) by  mouth every 8 (eight) hours as needed for nausea or vomiting. 20 tablet 0    risperiDONE (RISPERDAL) 2 MG tablet Take 1 tablet (2 mg total) by mouth at bedtime. 30 tablet 1     Patient Stressors: Medication change or noncompliance   Other: Conflict with family    Patient Strengths: Ability for insight  Average or above average intelligence  Supportive family/friends   Treatment Modalities: Medication Management, Group therapy, Case management,  1 to 1 session with clinician, Psychoeducation, Recreational therapy.   Physician Treatment Plan for Primary Diagnosis: Bipolar mixed affective disorder, moderate ([place]) Long Term Goal(s): Improvement in symptoms so as ready for discharge   Short Term Goals: Ability to identify and develop effective coping behaviors will improve Ability to disclose and discuss suicidal ideas Ability to demonstrate self-control will improve  Medication Management: Evaluate patient's response, side effects, and tolerance of medication regimen.  Therapeutic Interventions: 1 to 1 sessions, Unit Group sessions and Medication administration.  Evaluation of Outcomes: Not Met  Physician Treatment Plan for Secondary Diagnosis: Principal Problem:   Bipolar mixed affective disorder, moderate ([facility]) Active Problems:   Hip pain, chronic   Borderline personality disorder ([place])   DDD (degenerative disc disease), lumbar   Hypothyroidism due to acquired atrophy of thyroid   Essential hypertension   COPD (chronic obstructive pulmonary disease) ([place])   Constipation  Long Term Goal(s): Improvement in symptoms so as ready for discharge   Short Term Goals: Ability to identify and develop effective coping behaviors will improve Ability to disclose and discuss suicidal ideas Ability to demonstrate self-control will improve     Medication Management: Evaluate patient's response, side effects, and tolerance of medication regimen.  Therapeutic Interventions: 1 to 1 sessions,  Unit Group sessions and Medication administration.  Evaluation of Outcomes: Not Met   RN Treatment Plan for Primary Diagnosis: Bipolar mixed affective disorder, moderate ([place]) Long Term Goal(s): Knowledge of disease and therapeutic regimen to maintain health will improve  Short Term Goals: Ability to remain free from injury will improve, Ability to verbalize frustration and anger appropriately will improve, Ability to demonstrate self-control, Ability to participate in decision making will improve, Ability to verbalize feelings will improve, Ability to disclose and discuss suicidal ideas, Ability to identify and develop effective coping behaviors will improve, and Compliance with prescribed  medications will improve  Medication Management: RN will administer medications as ordered by provider, will assess and evaluate patient's response and provide education to patient for prescribed medication. RN will report any adverse and/or side effects to prescribing provider.  Therapeutic Interventions: 1 on 1 counseling sessions, Psychoeducation, Medication administration, Evaluate responses to treatment, Monitor vital signs and CBGs as ordered, Perform/monitor CIWA, COWS, AIMS and Fall Risk screenings as ordered, Perform wound care treatments as ordered.  Evaluation of Outcomes: Not Met   LCSW Treatment Plan for Primary Diagnosis: Bipolar mixed affective disorder, moderate ([place]) Long Term Goal(s): Safe transition to appropriate next level of care at discharge, Engage patient in therapeutic group addressing interpersonal concerns.  Short Term Goals: Engage patient in aftercare planning with referrals and resources, Increase social support, Increase ability to appropriately verbalize feelings, Increase emotional regulation, Facilitate acceptance of mental health diagnosis and concerns, and Increase skills for wellness and recovery  Therapeutic Interventions: Assess for all discharge needs, 1 to 1 time  with Social worker, Explore available resources and support systems, Assess for adequacy in community support network, Educate family and significant other(s) on suicide prevention, Complete Psychosocial Assessment, Interpersonal group therapy.  Evaluation of Outcomes: Not Met   Progress in Treatment: Attending groups: No. Participating in groups: No. Taking medication as prescribed: Yes. Toleration medication: Yes. Family/Significant other contact made: No, will contact:  when given permission. Patient understands diagnosis: Yes. Discussing patient identified problems/goals with staff: Yes. Medical problems stabilized or resolved: Yes. Denies suicidal/homicidal ideation: Yes. Issues/concerns per patient self-inventory: No. Other: none.  New problem(s) identified: No, Describe:  none.  New Short Term/Long Term Goal(s):  medication management for mood stabilization; elimination of SI thoughts; development of comprehensive mental wellness plan.   Patient Goals: "To try to reduce the paranoia."  Discharge Plan or Barriers: CSW will assist the pt with development of an appropriate aftercare/discharge plan.   Reason for Continuation of Hospitalization: Delusions  Medication stabilization Suicidal ideation  Estimated Length of Stay: 1-7 days   Scribe for Treatment Team: Shirl Harris, LCSW 11/21/2020 11:01 AM

## 2020-11-21 NOTE — Progress Notes (Signed)
Recreation Therapy Notes  Date: 11/21/2020  Time: 10:00 am      Location:  Craft room    Behavioral response: N/A   Intervention Topic: Stress Management    Discussion/Intervention: Patient did not attend group.   Clinical Observations/Feedback:  Patient did not attend group.   Tavarious Freel LRT/CTRS          Kirsten Richardson 11/21/2020 11:56 AM

## 2020-11-21 NOTE — Plan of Care (Signed)
  Problem: Education: Goal: Ability to state activities that reduce stress will improve Outcome: Progressing   Problem: Self-Concept: Goal: Ability to identify factors that promote anxiety will improve Outcome: Progressing Goal: Level of anxiety will decrease Outcome: Progressing Goal: Ability to modify response to factors that promote anxiety will improve Outcome: Progressing   Problem: Coping: Goal: Ability to identify and develop effective coping behavior will improve Outcome: Progressing   Problem: Education: Goal: Emotional status will improve Outcome: Progressing Goal: Mental status will improve Outcome: Progressing Goal: Verbalization of understanding the information provided will improve Outcome: Progressing   Problem: Activity: Goal: Will verbalize the importance of balancing activity with adequate rest periods Outcome: Progressing   Problem: Education: Goal: Will be free of psychotic symptoms Outcome: Progressing Goal: Knowledge of the prescribed therapeutic regimen will improve Outcome: Progressing   Problem: Coping: Goal: Coping ability will improve Outcome: Progressing Goal: Will verbalize feelings Outcome: Progressing   Problem: Health Behavior/Discharge Planning: Goal: Compliance with prescribed medication regimen will improve Outcome: Progressing   Problem: Nutritional: Goal: Ability to achieve adequate nutritional intake will improve Outcome: Progressing

## 2020-11-21 NOTE — [facility] Suicide Risk Assessment (Signed)
[facility] INPATIENT:  Family/Significant Other Suicide Prevention Education  Suicide Prevention Education:  Patient Refusal for Family/Significant Other Suicide Prevention Education: The patient Kirsten Richardson has refused to provide written consent for family/significant other to be provided Family/Significant Other Suicide Prevention Education during admission and/or prior to discharge.  Physician notified.  SPE completed with pt, as pt refused to consent to family contact. SPI pamphlet provided to pt and pt was encouraged to share information with support network, ask questions, and talk about any concerns relating to SPE. Pt denies access to guns/firearms and verbalized understanding of information provided. Mobile Crisis information also provided to pt.   [name] 11/21/2020, 11:11 AM
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
## 2020-11-21 NOTE — Progress Notes (Addendum)
Patient awakened by staff this am for labs, breakfast and medications. Reports sleeping well last night. Denies SI/HI/AH/VH. Endorsees pain. See pain assessment for details. Rates anxiety and depression 4/10.  Patient informs RN she needs a stronger nicotine patch because her normal regimen includes two 21 mcg patches and vaping.   Patient takes all medications as prescribed. No s/s of distress noted. Will cont to monitor for safety.
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
## 2020-11-21 NOTE — Group Note (Signed)
[facility] LCSW Group Therapy Note   Group Date: 11/21/2020 Start Time: 1310 End Time: 1415   Type of Therapy/Topic:  Group Therapy:  Emotion Regulation  Participation Level:  Did Not Attend   Mood:  Description of Group:    The purpose of this group is to assist patients in learning to regulate negative emotions and experience positive emotions. Patients will be guided to discuss ways in which they have been vulnerable to their negative emotions. These vulnerabilities will be juxtaposed with experiences of positive emotions or situations, and patients challenged to use positive emotions to combat negative ones. Special emphasis will be placed on coping with negative emotions in conflict situations, and patients will process healthy conflict resolution skills.  Therapeutic Goals: Patient will identify two positive emotions or experiences to reflect on in order to balance out negative emotions:  Patient will label two or more emotions that they find the most difficult to experience:  Patient will be able to demonstrate positive conflict resolution skills through discussion or role plays:   Summary of Patient Progress: X   Therapeutic Modalities:   Cognitive Behavioral Therapy Feelings Identification Dialectical Behavioral Therapy   Kirsten Richardson R Kirsten Maroney, LCSW 
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
## 2020-11-22 MED ORDER — HYDROXYZINE HCL 50 MG PO TABS
50.0000 mg | ORAL_TABLET | Freq: Three times a day (TID) | ORAL | 0 refills | Status: DC | PRN
Start: 2020-11-22 — End: 2022-11-27

## 2020-11-22 MED ORDER — ATORVASTATIN CALCIUM 20 MG PO TABS: 20.0000 mg | ORAL_TABLET | Freq: Every day | ORAL | 1 refills | Status: DC

## 2020-11-22 MED ORDER — ONDANSETRON 4 MG PO TBDP: 4.0000 mg | ORAL_TABLET | Freq: Three times a day (TID) | ORAL | 0 refills | Status: DC | PRN

## 2020-11-22 MED ORDER — RISPERIDONE 2 MG PO TABS: 2.0000 mg | ORAL_TABLET | Freq: Every day | ORAL | 1 refills | Status: DC

## 2020-11-22 MED ORDER — MELOXICAM 15 MG PO TABS: 15.0000 mg | ORAL_TABLET | Freq: Every day | ORAL | 0 refills | Status: DC

## 2020-11-22 MED ORDER — METHOCARBAMOL 750 MG PO TABS: 750.0000 mg | ORAL_TABLET | Freq: Two times a day (BID) | ORAL | 0 refills | Status: DC

## 2020-11-22 MED ORDER — MIRABEGRON ER 25 MG PO TB24: 25.0000 mg | ORAL_TABLET | Freq: Every day | ORAL | 0 refills | Status: DC

## 2020-11-22 MED ORDER — LAMOTRIGINE 150 MG PO TABS: 150.0000 mg | ORAL_TABLET | Freq: Two times a day (BID) | ORAL | 0 refills | Status: DC

## 2020-11-22 MED ORDER — LUBIPROSTONE 24 MCG PO CAPS
24.0000 ug | ORAL_CAPSULE | ORAL | 0 refills | Status: DC
Start: 2020-11-23 — End: 2022-11-27

## 2020-11-22 MED ORDER — DULOXETINE HCL 60 MG PO CPEP: 60.0000 mg | ORAL_CAPSULE | Freq: Every day | ORAL | 0 refills | Status: DC

## 2020-11-22 MED ORDER — NICOTINE 14 MG/24HR TD PT24: 14.0000 mg | MEDICATED_PATCH | Freq: Every day | TRANSDERMAL | 0 refills | Status: DC

## 2020-11-22 MED ORDER — LEVOTHYROXINE SODIUM 75 MCG PO TABS: 75.0000 ug | ORAL_TABLET | Freq: Every day | ORAL | 1 refills | Status: DC

## 2020-11-22 NOTE — Plan of Care (Signed)
Patient to be discharged today
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
## 2020-11-22 NOTE — Care Management Important Message (Signed)
Important Message  Patient Details  Name: Kirsten Richardson MRN: [id number] Date of Birth: 24-Dec-1975   Medicare Important Message Given:  Yes  Patient reports that she will NOT appeal.   [name], LCSW 11/22/2020, 12:06 PM

## 2020-11-22 NOTE — Progress Notes (Signed)
Patient discharged with prescriptions and follow up instructions. Verbalized understanding of information provided. Denied SI/HI. All belongings returned.

## 2020-11-22 NOTE — Progress Notes (Signed)
Recreation Therapy Notes  Date: 11/22/2020  Time: 10:30 am      Location:  Craft room    Behavioral response: N/A   Intervention Topic: Creative expressions   Discussion/Intervention: Patient did not attend group.   Clinical Observations/Feedback:  Patient did not attend group.   Payzlee Ryder LRT/CTRS        Rilee Wendling 11/22/2020 12:51 PM

## 2020-11-22 NOTE — Progress Notes (Addendum)
  [facility] Adult Case Management Discharge Plan :  Will you be returning to the same living situation after discharge:  Yes,  pt reports that she is returning home. At discharge, do you have transportation home?: Yes,  CSW will assist with transportation needs. Do you have the ability to pay for your medications: Yes,  [facility] Medicare.  Release of information consent forms completed and in the chart;  Patient's signature needed at discharge.  Patient to Follow up at:  Follow-up Information     [facility] Follow up.   Why: Your ACTT team will follow up with you 11/23/2020.  Thanks! Contact information: [address] [place] [address] [phone]                 Next level of care provider has access to [facility] Link:no  Safety Planning and Suicide Prevention discussed: Yes,  SPE completed with the patient.   or No.     Has patient been referred to the Quitline?: Patient refused referral  Patient has been referred for addiction treatment: Pt. refused referral  [name], LCSW 11/22/2020, 11:49 AM

## 2020-11-22 NOTE — BHH Suicide Risk Assessment (Signed)
[facility] Discharge Suicide Risk Assessment   Principal Problem: Bipolar mixed affective disorder, moderate (HCC) Discharge Diagnoses: Principal Problem:   Bipolar mixed affective disorder, moderate (HCC) Active Problems:   Hip pain, chronic   Borderline personality disorder (HCC)   DDD (degenerative disc disease), lumbar   Hypothyroidism due to acquired atrophy of thyroid   Essential hypertension   COPD (chronic obstructive pulmonary disease) (HCC)   Constipation   Total Time spent with patient: 45 minutes  Musculoskeletal: Strength & Muscle Tone: within normal limits Gait & Station: normal Patient leans: N/A  Psychiatric Specialty Exam  Presentation  General Appearance: Appropriate for Environment  Eye Contact:Fair  Speech:Clear and Coherent  Speech Volume:Normal  Handedness:Right   Mood and Affect  Mood:Euthymic  Duration of Depression Symptoms: N/A  Affect:Congruent   Thought Process  Thought Processes:Goal Directed  Descriptions of Associations:Intact  Orientation:Full (Time, Place and Person)  Thought Content:Scattered  History of Schizophrenia/Schizoaffective disorder:No  Duration of Psychotic Symptoms:Greater than six months  Hallucinations:No data recorded Ideas of Reference:Paranoia  Suicidal Thoughts:No data recorded Homicidal Thoughts:No data recorded  Sensorium  Memory:Immediate Good; Recent Good; Remote Good  Judgment:Fair  Insight:Poor   Executive Functions  Concentration:Fair  Attention Span:Fair  Recall:Fair  Fund of Knowledge:Fair  Language:Fair   Psychomotor Activity  Psychomotor Activity:No data recorded  Assets  Assets:Resilience; Financial Resources/Insurance   Sleep  Sleep:No data recorded  Physical Exam: Physical Exam Vitals and nursing note reviewed.  Constitutional:      Appearance: Normal appearance.  HENT:     Head: Normocephalic and atraumatic.     Mouth/Throat:     Pharynx: Oropharynx is  clear.  Eyes:     Pupils: Pupils are equal, round, and reactive to light.  Cardiovascular:     Rate and Rhythm: Normal rate and regular rhythm.  Pulmonary:     Effort: Pulmonary effort is normal.     Breath sounds: Normal breath sounds.  Abdominal:     General: Abdomen is flat.     Palpations: Abdomen is soft.  Musculoskeletal:        General: Normal range of motion.  Skin:    General: Skin is warm and dry.  Neurological:     General: No focal deficit present.     Mental Status: She is alert. Mental status is at baseline.  Psychiatric:        Attention and Perception: Attention normal.        Mood and Affect: Mood normal. Affect is blunt.        Speech: Speech normal.        Behavior: Behavior is withdrawn.        Thought Content: Thought content normal.        Cognition and Memory: Cognition normal.        Judgment: Judgment is impulsive.   Review of Systems  Constitutional: Negative.   HENT: Negative.    Eyes: Negative.   Respiratory: Negative.    Cardiovascular: Negative.   Gastrointestinal: Negative.   Musculoskeletal: Negative.   Skin: Negative.   Neurological: Negative.   Psychiatric/Behavioral: Negative.    Blood pressure (!) 87/55, pulse 91, temperature 97.7 F (36.5 C), temperature source Oral, resp. rate 17, height 4\' 11"  (1.499 m), weight 62.1 kg, SpO2 97 %. Body mass index is 27.67 kg/m.  Mental Status Per Nursing Assessment::   On Admission:  NA  Demographic Factors:  Caucasian and Low socioeconomic status  Loss Factors: Legal issues  Historical Factors: Impulsivity  Risk Reduction Factors:  Sense of responsibility to family, Positive social support, and Positive therapeutic relationship  Continued Clinical Symptoms:  Bipolar Disorder:   Mixed State  Cognitive Features That Contribute To Risk:  None    Suicide Risk:  Minimal: No identifiable suicidal ideation.  Patients presenting with no risk factors but with morbid ruminations; may be  classified as minimal risk based on the severity of the depressive symptoms    Plan Of Care/Follow-up recommendations:  Patient reevaluated.  Has not shown any dangerous behavior in the hospital.  Continually denies suicidal ideation.  Affect slightly blunted but mood stated as okay.  Patient cooperative with medicine and has a safe plan for living and follow-up situation.  Reviewed case with treatment team.  Discharge home at this point.  Mordecai Rasmussen, MD 11/22/2020, 10:42 AM

## 2021-01-15 ENCOUNTER — Other Ambulatory Visit: Payer: Self-pay | Admitting: Obstetrics and Gynecology

## 2021-01-15 DIAGNOSIS — Z1231 Encounter for screening mammogram for malignant neoplasm of breast: Secondary | ICD-10-CM

## 2021-01-22 ENCOUNTER — Telehealth: Payer: Self-pay

## 2021-01-22 NOTE — Telephone Encounter (Signed)
Medication refill - Fax received from pt's [facility] in [place] for a refill of past prescribed Methocarbamol, last filled 12/20/20.

## 2021-09-05 ENCOUNTER — Other Ambulatory Visit: Payer: Self-pay | Admitting: Internal Medicine

## 2021-09-05 DIAGNOSIS — Z1231 Encounter for screening mammogram for malignant neoplasm of breast: Secondary | ICD-10-CM

## 2022-04-23 ENCOUNTER — Other Ambulatory Visit: Payer: Self-pay

## 2022-04-23 DIAGNOSIS — Z1231 Encounter for screening mammogram for malignant neoplasm of breast: Secondary | ICD-10-CM

## 2022-05-13 ENCOUNTER — Ambulatory Visit
Admission: RE | Admit: 2022-05-13 | Discharge: 2022-05-13 | Disposition: A | Discharge status: Home / Self Care | Payer: 59 | Source: Ambulatory Visit | Attending: Obstetrics and Gynecology | Admitting: Obstetrics and Gynecology

## 2022-05-13 DIAGNOSIS — Z1231 Encounter for screening mammogram for malignant neoplasm of breast: Secondary | ICD-10-CM | POA: Insufficient documentation

## 2022-05-21 ENCOUNTER — Other Ambulatory Visit: Payer: Self-pay | Admitting: Certified Nurse Midwife

## 2022-05-21 DIAGNOSIS — R921 Mammographic calcification found on diagnostic imaging of breast: Secondary | ICD-10-CM

## 2022-05-21 DIAGNOSIS — R928 Other abnormal and inconclusive findings on diagnostic imaging of breast: Secondary | ICD-10-CM

## 2022-05-23 ENCOUNTER — Ambulatory Visit
Admission: RE | Admit: 2022-05-23 | Discharge: 2022-05-23 | Disposition: A | Discharge status: Home / Self Care | Payer: 59 | Source: Ambulatory Visit | Attending: Certified Nurse Midwife | Admitting: Certified Nurse Midwife

## 2022-05-23 DIAGNOSIS — R928 Other abnormal and inconclusive findings on diagnostic imaging of breast: Secondary | ICD-10-CM | POA: Insufficient documentation

## 2022-05-23 DIAGNOSIS — R921 Mammographic calcification found on diagnostic imaging of breast: Secondary | ICD-10-CM | POA: Diagnosis present

## 2022-05-26 ENCOUNTER — Other Ambulatory Visit: Payer: Self-pay | Admitting: Certified Nurse Midwife

## 2022-05-28 ENCOUNTER — Other Ambulatory Visit: Payer: Self-pay | Admitting: Certified Nurse Midwife

## 2022-05-28 DIAGNOSIS — R921 Mammographic calcification found on diagnostic imaging of breast: Secondary | ICD-10-CM

## 2022-11-28 NOTE — Progress Notes (Signed)
Surgical Instructions   Your procedure is scheduled on Friday December 05, 2022. Report to [facility] Main Entrance "A" at 5:30 A.M., then check in with the Admitting office. Any questions or running late day of surgery: call [phone]  Questions prior to your surgery date: call [phone], Monday-Friday, 8am-4pm. If you experience any cold or flu symptoms such as cough, fever, chills, shortness of breath, etc. between now and your scheduled surgery, please notify us at the above number.     Remember:  Do not eat or drink after midnight the night before your surgery  Take these medicines the morning of surgery with A SIP OF WATER  budesonide-formoterol (SYMBICORT) 160-4.5 MCG/ACT inhaler  cefdinir (OMNICEF)  INCRUSE ELLIPTA  predniSONE (DELTASONE)  pregabalin (LYRICA)  topiramate (TOPAMAX)   May take these medicines IF NEEDED: albuterol (VENTOLIN HFA) 108 (90 Base) MCG/ACT inhaler. Please bring inhaler with you to the hospital.   promethazine-dextromethorphan (PROMETHAZINE-DM)   One week prior to surgery, STOP taking any Aspirin (unless otherwise instructed by your surgeon) Aleve, Naproxen, Ibuprofen, Motrin, Advil, Goody's, BC's, all herbal medications, fish oil, and non-prescription vitamins.  This includes your diclofenac Sodium (VOLTAREN) 1 % GEL.                       Do NOT Smoke (Tobacco/Vaping) for 24 hours prior to your procedure.  If you use a CPAP at night, you may bring your mask/headgear for your overnight stay.   You will be asked to remove any contacts, glasses, piercing's, hearing aid's, dentures/partials prior to surgery. Please bring cases for these items if needed.    Patients discharged the day of surgery will not be allowed to drive home, and someone needs to stay with them for 24 hours.  SURGICAL WAITING ROOM VISITATION Patients may have no more than 2 support people in the waiting area - these visitors may rotate.   Pre-op nurse will coordinate an  appropriate time for 1 ADULT support person, who may not rotate, to accompany patient in pre-op.  Children under the age of 48 must have an adult with them who is not the patient and must remain in the main waiting area with an adult.  If the patient needs to stay at the hospital during part of their recovery, the visitor guidelines for inpatient rooms apply.  Please refer to the [facility] website for the visitor guidelines for any additional information.   If you received a COVID test during your pre-op visit  it is requested that you wear a mask when out in public, stay away from anyone that may not be feeling well and notify your surgeon if you develop symptoms. If you have been in contact with anyone that has tested positive in the last 10 days please notify you surgeon.      Pre-operative CHG Bathing Instructions   You can play a key role in reducing the risk of infection after surgery. Your skin needs to be as free of germs as possible. You can reduce the number of germs on your skin by washing with CHG (chlorhexidine gluconate) soap before surgery. CHG is an antiseptic soap that kills germs and continues to kill germs even after washing.   DO NOT use if you have an allergy to chlorhexidine/CHG or antibacterial soaps. If your skin becomes reddened or irritated, stop using the CHG and notify one of our RNs at [phone].  TAKE A SHOWER THE NIGHT BEFORE SURGERY AND THE DAY OF SURGERY    Please keep in mind the following:  DO NOT shave, including legs and underarms, 48 hours prior to surgery.   You may shave your face before/day of surgery.  Place clean sheets on your bed the night before surgery Use a clean washcloth (not used since being washed) for each shower. DO NOT sleep with pet's night before surgery.  CHG Shower Instructions:  Wash your face and private area with normal soap. If you choose to wash your hair, wash first with your normal shampoo.  After you use  shampoo/soap, rinse your hair and body thoroughly to remove shampoo/soap residue.  Turn the water OFF and apply half the bottle of CHG soap to a CLEAN washcloth.  Apply CHG soap ONLY FROM YOUR NECK DOWN TO YOUR TOES (washing for 3-5 minutes)  DO NOT use CHG soap on face, private areas, open wounds, or sores.  Pay special attention to the area where your surgery is being performed.  If you are having back surgery, having someone wash your back for you may be helpful. Wait 2 minutes after CHG soap is applied, then you may rinse off the CHG soap.  Pat dry with a clean towel  Put on clean pajamas    Additional instructions for the day of surgery: DO NOT APPLY any lotions, deodorants or perfumes.   Do not wear jewelry or makeup Do not wear nail polish, gel polish, artificial nails, or any other type of covering on natural nails (fingers and toes) Do not bring valuables to the hospital. [facility] is not responsible for valuables/personal belongings. Put on clean/comfortable clothes.  Please brush your teeth.  Ask your nurse before applying any prescription medications to the skin.

## 2022-12-01 ENCOUNTER — Inpatient Hospital Stay (HOSPITAL_COMMUNITY)
Admission: RE | Admit: 2022-12-01 | Discharge: 2022-12-01 | Disposition: A | Discharge status: Home / Self Care | Payer: 59 | Source: Ambulatory Visit | Attending: Oral Surgery

## 2022-12-01 ENCOUNTER — Other Ambulatory Visit: Payer: Self-pay

## 2022-12-01 ENCOUNTER — Encounter (HOSPITAL_COMMUNITY): Payer: Self-pay

## 2022-12-01 VITALS — BP 104/50 | HR 51 | Temp 98.7°F | Resp 20 | Ht 59.0 in | Wt 160.6 lb

## 2022-12-01 DIAGNOSIS — J449 Chronic obstructive pulmonary disease, unspecified: Secondary | ICD-10-CM | POA: Diagnosis not present

## 2022-12-01 DIAGNOSIS — Z79899 Other long term (current) drug therapy: Secondary | ICD-10-CM | POA: Insufficient documentation

## 2022-12-01 DIAGNOSIS — I10 Essential (primary) hypertension: Secondary | ICD-10-CM | POA: Insufficient documentation

## 2022-12-01 DIAGNOSIS — Z01818 Encounter for other preprocedural examination: Secondary | ICD-10-CM | POA: Diagnosis present

## 2022-12-01 HISTORY — DX: Sleep apnea, unspecified: G47.30

## 2022-12-01 LAB — BASIC METABOLIC PANEL
Anion gap: 5 (ref 5–15)
BUN: 13 mg/dL (ref 6–20)
CO2: 28 mmol/L (ref 22–32)
Calcium: 9.5 mg/dL (ref 8.9–10.3)
Chloride: 104 mmol/L (ref 98–111)
Creatinine, Ser: 0.71 mg/dL (ref 0.44–1.00)
GFR, Estimated: >60 mL/min (ref >60)
Glucose, Bld: 91 mg/dL (ref 70–99)
Potassium: 4.9 mmol/L (ref 3.5–5.1)
Sodium: 137 mmol/L (ref 135–145)

## 2022-12-01 LAB — CBC
HCT: 44.5 % (ref 36.0–46.0)
Hemoglobin: 14.4 g/dL (ref 12.0–15.0)
MCH: 29.4 pg (ref 26.0–34.0)
MCHC: 32.4 g/dL (ref 30.0–36.0)
MCV: 91 fL (ref 80.0–100.0)
Platelets: 338 10*3/uL (ref 150–400)
RBC: 4.89 MIL/uL (ref 3.87–5.11)
RDW: 12.3 % (ref 11.5–15.5)
WBC: 8.9 10*3/uL (ref 4.0–10.5)
nRBC: 0 % (ref 0.0–0.2)

## 2022-12-01 NOTE — Progress Notes (Signed)
Anesthesia Chart Review:  47 year old female with pertinent history including HTN, GERD, mild COPD maintained on Incruse Ellipta and as needed albuterol.  She was seen by her PCP on 11/24/2022 with complaints of sinus drainage and was prescribed cefdinir and prednisone.  I spoke with the patient and she said that the reason she went to be seen was because she blew her nose and had green mucus.  She denies having any other symptoms at that time; no cough, no shortness of breath, no fever.  She said that sinus drainage essentially resolved on the 19th and she discontinued the medications after the 21st.  At preadmission testing today she is well-appearing, in no acute distress, lungs clear to auscultation bilaterally, heart regular rate and rhythm.  Given how quickly her sinus drainage resolved and current lack of any signs or symptoms of illness, anticipate she can proceed as planned barring any acute status change.  Preop labs reviewed, WNL.   EKG 12/01/22: Sinus bradycardia. Rate 44. Prior tracing 09/10/20 also shows sinus brady at a rate of 50.    Zannie Cove [facility] Short Stay Center/Anesthesiology Phone [phone] 12/01/2022 3:47 PM
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
## 2022-12-01 NOTE — Progress Notes (Signed)
PCP - Dr. Marcelino Duster Cardiologist -   PPM/ICD - denies Device Orders - na Rep Notified - na  Chest x-ray - na EKG - PAT, 12/01/2022 Stress Test -  ECHO -  Cardiac Cath -   Sleep Study - Diagnosed with sleep apnea CPAP - does not wear CPAP  Non-diabetic  Blood Thinner Instructions:denies Aspirin Instructions:denies  ERAS Protcol -NPO  COVID TEST- na  Anesthesia review: Spoke to Antionette Poles, [place] re; patient going to urgent care and starting on antibiotic for sinus infection on 11/17/2022.  Was on Cefidnir 300 BID and prednisone 10 mig BID.  States she stopped it after 3 days as she was feeling better.   Patient denies shortness of breath, fever, cough and chest pain at PAT appointment   All instructions explained to the patient, with a verbal understanding of the material. Patient agrees to go over the instructions while at home for a better understanding. Patient also instructed to self quarantine after being tested for COVID-19. The opportunity to ask questions was provided.

## 2022-12-01 NOTE — H&P (Signed)
  Patient: Kirsten Richardson  PID: [id number]  DOB: 1975/07/16  SEX: Female   Patient referred by DDS for removal lesion on lower edentulous mandibular ridge.  CC: Painful lesion. Dentures made 5 years ago. Dont fit. Dentist needs tissue removed before new dentures can be made.  Past Medical History:  Snoring, Sleep Apnea, Difficult breathing, Emphysema, Bruise Easily, Thyroid trouble, High Cholesterol, Arthritis, Chronic Fatigue Syndrome, Anxiety, Obese  Medications: Rinvoq, Omeprazole, Cetirizine, Fish Oil    Allergies:     Linzess, Vraylar    Surgeries:         Social History       Smoking:            Alcohol: Drug use:                             Exam: BMI 33. Edentulous maxilla/mandible. Leafy, pink lesion anterior mandibular ridge/FOM approximately 4 cm x 3 cm.  No purulence, edema, fluctuance, trismus. Oral cancer screening negative. Pharynx clear. No lymphadenopathy.  Panorex:N/A  Assessment: ASA 2.   Epulis fissuratum under tongue, on mandibular ridge.            Plan: Excise lesion with GA. Hospital Day surgery.                 Rx: n               Risks and complications explained. Questions answered.   [place] Lopes, DMD

## 2022-12-04 ENCOUNTER — Encounter (HOSPITAL_COMMUNITY): Payer: Self-pay | Admitting: Oral Surgery

## 2022-12-05 ENCOUNTER — Ambulatory Visit (HOSPITAL_BASED_OUTPATIENT_CLINIC_OR_DEPARTMENT_OTHER): Payer: 59 | Admitting: Anesthesiology

## 2022-12-05 ENCOUNTER — Ambulatory Visit (HOSPITAL_COMMUNITY)
Admission: RE | Admit: 2022-12-05 | Discharge: 2022-12-05 | Disposition: A | Discharge status: Home / Self Care | Payer: 59 | Attending: Oral Surgery | Admitting: Oral Surgery

## 2022-12-05 ENCOUNTER — Other Ambulatory Visit: Payer: Self-pay

## 2022-12-05 ENCOUNTER — Other Ambulatory Visit (HOSPITAL_COMMUNITY): Payer: Self-pay

## 2022-12-05 ENCOUNTER — Ambulatory Visit (HOSPITAL_COMMUNITY): Payer: 59 | Admitting: Physician Assistant

## 2022-12-05 ENCOUNTER — Encounter (HOSPITAL_COMMUNITY)
Admission: RE | Disposition: A | Discharge status: Home / Self Care | Payer: Self-pay | Source: Home / Self Care | Attending: Oral Surgery

## 2022-12-05 ENCOUNTER — Encounter (HOSPITAL_COMMUNITY): Payer: Self-pay | Admitting: Oral Surgery

## 2022-12-05 DIAGNOSIS — M1990 Unspecified osteoarthritis, unspecified site: Secondary | ICD-10-CM | POA: Diagnosis not present

## 2022-12-05 DIAGNOSIS — E039 Hypothyroidism, unspecified: Secondary | ICD-10-CM | POA: Diagnosis not present

## 2022-12-05 DIAGNOSIS — Z6832 Body mass index (BMI) 32.0-32.9, adult: Secondary | ICD-10-CM | POA: Diagnosis not present

## 2022-12-05 DIAGNOSIS — I10 Essential (primary) hypertension: Secondary | ICD-10-CM | POA: Diagnosis not present

## 2022-12-05 DIAGNOSIS — E7800 Pure hypercholesterolemia, unspecified: Secondary | ICD-10-CM | POA: Diagnosis not present

## 2022-12-05 DIAGNOSIS — J439 Emphysema, unspecified: Secondary | ICD-10-CM | POA: Insufficient documentation

## 2022-12-05 DIAGNOSIS — M5030 Other cervical disc degeneration, unspecified cervical region: Secondary | ICD-10-CM | POA: Insufficient documentation

## 2022-12-05 DIAGNOSIS — E669 Obesity, unspecified: Secondary | ICD-10-CM | POA: Insufficient documentation

## 2022-12-05 DIAGNOSIS — D649 Anemia, unspecified: Secondary | ICD-10-CM | POA: Insufficient documentation

## 2022-12-05 DIAGNOSIS — K1370 Unspecified lesions of oral mucosa: Secondary | ICD-10-CM | POA: Diagnosis not present

## 2022-12-05 DIAGNOSIS — Z87891 Personal history of nicotine dependence: Secondary | ICD-10-CM | POA: Diagnosis not present

## 2022-12-05 DIAGNOSIS — F319 Bipolar disorder, unspecified: Secondary | ICD-10-CM | POA: Diagnosis not present

## 2022-12-05 DIAGNOSIS — K219 Gastro-esophageal reflux disease without esophagitis: Secondary | ICD-10-CM | POA: Insufficient documentation

## 2022-12-05 DIAGNOSIS — K279 Peptic ulcer, site unspecified, unspecified as acute or chronic, without hemorrhage or perforation: Secondary | ICD-10-CM | POA: Insufficient documentation

## 2022-12-05 DIAGNOSIS — Z79899 Other long term (current) drug therapy: Secondary | ICD-10-CM | POA: Insufficient documentation

## 2022-12-05 DIAGNOSIS — K1379 Other lesions of oral mucosa: Secondary | ICD-10-CM | POA: Diagnosis not present

## 2022-12-05 DIAGNOSIS — F419 Anxiety disorder, unspecified: Secondary | ICD-10-CM | POA: Insufficient documentation

## 2022-12-05 DIAGNOSIS — M25551 Pain in right hip: Secondary | ICD-10-CM | POA: Insufficient documentation

## 2022-12-05 DIAGNOSIS — G4730 Sleep apnea, unspecified: Secondary | ICD-10-CM | POA: Diagnosis not present

## 2022-12-05 DIAGNOSIS — K589 Irritable bowel syndrome without diarrhea: Secondary | ICD-10-CM | POA: Insufficient documentation

## 2022-12-05 HISTORY — PX: TOOTH EXTRACTION: SHX859

## 2022-12-05 LAB — POCT PREGNANCY, URINE: Preg Test, Ur: NEGATIVE

## 2022-12-05 SURGERY — DENTAL RESTORATION/EXTRACTIONS
Anesthesia: General

## 2022-12-05 MED ORDER — LACTATED RINGERS IV SOLN: INTRAVENOUS | Status: DC | PRN

## 2022-12-05 MED ORDER — OXYMETAZOLINE HCL 0.05 % NA SOLN
NASAL | Status: AC
  Filled 2022-12-05: qty 30

## 2022-12-05 MED ORDER — LIDOCAINE 2% (20 MG/ML) 5 ML SYRINGE
INTRAMUSCULAR | Status: DC | PRN
  Administered 2022-12-05: 80 mg via INTRAVENOUS

## 2022-12-05 MED ORDER — CHLORHEXIDINE GLUCONATE 0.12 % MT SOLN
15.0000 mL | Freq: Once | OROMUCOSAL | Status: AC
Start: 2022-12-05 — End: 2022-12-05

## 2022-12-05 MED ORDER — DEXAMETHASONE SODIUM PHOSPHATE 10 MG/ML IJ SOLN
INTRAMUSCULAR | Status: AC
  Filled 2022-12-05: qty 1

## 2022-12-05 MED ORDER — LIDOCAINE 2% (20 MG/ML) 5 ML SYRINGE
INTRAMUSCULAR | Status: AC
  Filled 2022-12-05: qty 5

## 2022-12-05 MED ORDER — OXYCODONE HCL 5 MG/5ML PO SOLN
5.0000 mg | Freq: Once | ORAL | Status: AC | PRN
  Administered 2022-12-05: 5 mg via ORAL

## 2022-12-05 MED ORDER — LIDOCAINE-EPINEPHRINE 2 %-1:100000 IJ SOLN
INTRAMUSCULAR | Status: DC | PRN
  Administered 2022-12-05: 16 mL

## 2022-12-05 MED ORDER — FENTANYL CITRATE (PF) 100 MCG/2ML IJ SOLN
INTRAMUSCULAR | Status: AC
  Filled 2022-12-05: qty 2

## 2022-12-05 MED ORDER — AMOXICILLIN 500 MG PO CAPS
500.0000 mg | ORAL_CAPSULE | Freq: Three times a day (TID) | ORAL | 0 refills | Status: AC
  Filled 2022-12-05: qty 21, 7d supply, fill #0

## 2022-12-05 MED ORDER — PROPOFOL 10 MG/ML IV BOLUS
INTRAVENOUS | Status: DC | PRN
  Administered 2022-12-05: 150 mg via INTRAVENOUS

## 2022-12-05 MED ORDER — LIDOCAINE-EPINEPHRINE 2 %-1:100000 IJ SOLN
INTRAMUSCULAR | Status: AC
Start: 2022-12-05 — End: ?
  Filled 2022-12-05: qty 1

## 2022-12-05 MED ORDER — OXYCODONE HCL 5 MG/5ML PO SOLN
ORAL | Status: AC
  Filled 2022-12-05: qty 5

## 2022-12-05 MED ORDER — ONDANSETRON HCL 4 MG/2ML IJ SOLN
INTRAMUSCULAR | Status: DC | PRN
  Administered 2022-12-05: 4 mg via INTRAVENOUS

## 2022-12-05 MED ORDER — CEFAZOLIN SODIUM-DEXTROSE 2-4 GM/100ML-% IV SOLN
2.0000 g | INTRAVENOUS | Status: AC
  Administered 2022-12-05: 2 g via INTRAVENOUS

## 2022-12-05 MED ORDER — ONDANSETRON HCL 4 MG/2ML IJ SOLN
INTRAMUSCULAR | Status: AC
Start: 2022-12-05 — End: ?
  Filled 2022-12-05: qty 2

## 2022-12-05 MED ORDER — 0.9 % SODIUM CHLORIDE (POUR BTL) OPTIME
TOPICAL | Status: DC | PRN
  Administered 2022-12-05: 1000 mL

## 2022-12-05 MED ORDER — CHLORHEXIDINE GLUCONATE 0.12 % MT SOLN
OROMUCOSAL | Status: AC
  Administered 2022-12-05: 15 mL via OROMUCOSAL
  Filled 2022-12-05: qty 15

## 2022-12-05 MED ORDER — DROPERIDOL 2.5 MG/ML IJ SOLN
INTRAMUSCULAR | Status: AC
  Filled 2022-12-05: qty 2

## 2022-12-05 MED ORDER — OXYCODONE-ACETAMINOPHEN 5-325 MG PO TABS
1.0000 | ORAL_TABLET | ORAL | 0 refills | Status: AC | PRN
  Filled 2022-12-05: qty 18, 3d supply, fill #0

## 2022-12-05 MED ORDER — DROPERIDOL 2.5 MG/ML IJ SOLN
0.6250 mg | Freq: Once | INTRAMUSCULAR | Status: AC | PRN
  Administered 2022-12-05: 0.625 mg via INTRAVENOUS

## 2022-12-05 MED ORDER — ROCURONIUM BROMIDE 10 MG/ML (PF) SYRINGE
PREFILLED_SYRINGE | INTRAVENOUS | Status: DC | PRN
  Administered 2022-12-05: 60 mg via INTRAVENOUS

## 2022-12-05 MED ORDER — FENTANYL CITRATE (PF) 100 MCG/2ML IJ SOLN
25.0000 ug | INTRAMUSCULAR | Status: DC | PRN
  Administered 2022-12-05: 50 ug via INTRAVENOUS

## 2022-12-05 MED ORDER — MIDAZOLAM HCL 2 MG/2ML IJ SOLN
INTRAMUSCULAR | Status: DC | PRN
  Administered 2022-12-05: 2 mg via INTRAVENOUS

## 2022-12-05 MED ORDER — FENTANYL CITRATE (PF) 250 MCG/5ML IJ SOLN
INTRAMUSCULAR | Status: AC
  Filled 2022-12-05: qty 5

## 2022-12-05 MED ORDER — SUGAMMADEX SODIUM 200 MG/2ML IV SOLN
INTRAVENOUS | Status: DC | PRN
  Administered 2022-12-05: 200 mg via INTRAVENOUS

## 2022-12-05 MED ORDER — FENTANYL CITRATE (PF) 250 MCG/5ML IJ SOLN
INTRAMUSCULAR | Status: DC | PRN
  Administered 2022-12-05: 100 ug via INTRAVENOUS
  Administered 2022-12-05: 50 ug via INTRAVENOUS

## 2022-12-05 MED ORDER — DEXAMETHASONE SODIUM PHOSPHATE 10 MG/ML IJ SOLN: INTRAMUSCULAR | Status: DC | PRN

## 2022-12-05 MED ORDER — DEXAMETHASONE SODIUM PHOSPHATE 10 MG/ML IJ SOLN
INTRAMUSCULAR | Status: DC | PRN
  Administered 2022-12-05: 10 mg via INTRAVENOUS

## 2022-12-05 MED ORDER — ORAL CARE MOUTH RINSE: 15.0000 mL | Freq: Once | OROMUCOSAL | Status: AC

## 2022-12-05 MED ORDER — OXYCODONE HCL 5 MG PO TABS: 5.0000 mg | ORAL_TABLET | Freq: Once | ORAL | Status: AC | PRN

## 2022-12-05 MED ORDER — ONDANSETRON HCL 4 MG/2ML IJ SOLN: 4.0000 mg | Freq: Once | INTRAMUSCULAR | Status: DC | PRN

## 2022-12-05 MED ORDER — PROPOFOL 10 MG/ML IV BOLUS
INTRAVENOUS | Status: AC
  Filled 2022-12-05: qty 20

## 2022-12-05 MED ORDER — ROCURONIUM BROMIDE 10 MG/ML (PF) SYRINGE
PREFILLED_SYRINGE | INTRAVENOUS | Status: AC
  Filled 2022-12-05: qty 10

## 2022-12-05 MED ORDER — CEFAZOLIN SODIUM-DEXTROSE 2-4 GM/100ML-% IV SOLN
INTRAVENOUS | Status: AC
  Filled 2022-12-05: qty 100

## 2022-12-05 MED ORDER — MIDAZOLAM HCL 2 MG/2ML IJ SOLN
INTRAMUSCULAR | Status: AC
  Filled 2022-12-05: qty 2

## 2022-12-05 MED ORDER — PHENYLEPHRINE 80 MCG/ML (10ML) SYRINGE FOR IV PUSH (FOR BLOOD PRESSURE SUPPORT)
PREFILLED_SYRINGE | INTRAVENOUS | Status: AC
Start: 2022-12-05 — End: ?
  Filled 2022-12-05: qty 10

## 2022-12-05 MED ORDER — PHENYLEPHRINE 80 MCG/ML (10ML) SYRINGE FOR IV PUSH (FOR BLOOD PRESSURE SUPPORT)
PREFILLED_SYRINGE | INTRAVENOUS | Status: DC | PRN
  Administered 2022-12-05: 80 ug via INTRAVENOUS

## 2022-12-05 SURGICAL SUPPLY — 32 items
APPLICATOR COTTON TIP 6 STRL (MISCELLANEOUS) IMPLANT
APPLICATOR COTTON TIP 6IN STRL (MISCELLANEOUS) ×5 IMPLANT
BAG COUNTER SPONGE SURGICOUNT (BAG) IMPLANT
BLADE SURG 15 STRL LF DISP TIS (BLADE) ×1 IMPLANT
BUR CROSS CUT FISSURE 1.6 (BURR) ×1 IMPLANT
BUR EGG ELITE 4.0 (BURR) ×1 IMPLANT
CANISTER SUCT 3000ML PPV (MISCELLANEOUS) ×1 IMPLANT
COVER SURGICAL LIGHT HANDLE (MISCELLANEOUS) ×1 IMPLANT
ELECT COATED BLADE 2.86 ST (ELECTRODE) IMPLANT
GAUZE PACKING FOLDED 2 STR (GAUZE/BANDAGES/DRESSINGS) ×1 IMPLANT
GLOVE BIO SURGEON STRL SZ8 (GLOVE) ×1 IMPLANT
GOWN STRL REUS W/ TWL LRG LVL3 (GOWN DISPOSABLE) ×1 IMPLANT
GOWN STRL REUS W/ TWL XL LVL3 (GOWN DISPOSABLE) ×1 IMPLANT
IV NS 1000ML BAXH (IV SOLUTION) ×1 IMPLANT
KIT BASIN OR (CUSTOM PROCEDURE TRAY) ×1 IMPLANT
KIT TURNOVER KIT B (KITS) ×1 IMPLANT
NDL HYPO 25GX1X1/2 BEV (NEEDLE) ×2 IMPLANT
NEEDLE HYPO 25GX1X1/2 BEV (NEEDLE) ×1 IMPLANT
NS IRRIG 1000ML POUR BTL (IV SOLUTION) ×1 IMPLANT
PAD ARMBOARD 7.5X6 YLW CONV (MISCELLANEOUS) ×1 IMPLANT
PENCIL BUTTON HOLSTER BLD 10FT (ELECTRODE) IMPLANT
SLEEVE IRRIGATION ELITE 7 (MISCELLANEOUS) ×1 IMPLANT
SPIKE FLUID TRANSFER (MISCELLANEOUS) ×1 IMPLANT
SPONGE SURGIFOAM ABS GEL 12-7 (HEMOSTASIS) IMPLANT
SUT PLAIN 3 0 PS2 27 (SUTURE) ×1 IMPLANT
SUT VICRYL CTD 3-0 1X27 RB-1 (SUTURE) ×1
SUTURE VICRL CTD 3-0 1X27 RB-1 (SUTURE) IMPLANT
SYR BULB IRRIG 60ML STRL (SYRINGE) ×1 IMPLANT
SYR CONTROL 10ML LL (SYRINGE) ×1 IMPLANT
TRAY ENT MC OR (CUSTOM PROCEDURE TRAY) ×1 IMPLANT
TUBING IRRIGATION (MISCELLANEOUS) ×1 IMPLANT
YANKAUER SUCT BULB TIP NO VENT (SUCTIONS) ×1 IMPLANT

## 2022-12-05 NOTE — Anesthesia Postprocedure Evaluation (Signed)
Anesthesia Post Note  Patient: Kirsten Richardson  Procedure(s) Performed: EXCISION LESION FLOOR OF MOUTH     Patient location during evaluation: PACU Anesthesia Type: General Level of consciousness: awake and alert and oriented Pain management: pain level controlled Vital Signs Assessment: post-procedure vital signs reviewed and stable Respiratory status: spontaneous breathing, nonlabored ventilation and respiratory function stable Cardiovascular status: blood pressure returned to baseline and stable Postop Assessment: no apparent nausea or vomiting Anesthetic complications: no   No notable events documented.  Last Vitals:  Vitals:   12/05/22 0845 12/05/22 0900  BP: 123/64 (!) 142/74  Pulse: 92 83  Resp: 16 (!) 26  Temp:    SpO2: 100% 98%    Last Pain:  Vitals:   12/05/22 0900  PainSc: 6                  [name]

## 2022-12-05 NOTE — Transfer of Care (Signed)
Immediate Anesthesia Transfer of Care Note  Patient: Kirsten Richardson  Procedure(s) Performed: EXCISION LESION FLOOR OF MOUTH  Patient Location: PACU  Anesthesia Type:General  Level of Consciousness: awake, alert , and oriented  Airway & Oxygen Therapy: Patient Spontanous Breathing and Patient connected to face mask oxygen  Post-op Assessment: Report given to RN and Post -op Vital signs reviewed and stable  Post vital signs: Reviewed and stable  Last Vitals:  Vitals Value Taken Time  BP 128/57 12/05/22 0832  Temp 36.6 C 12/05/22 0832  Pulse 90 12/05/22 0838  Resp 13 12/05/22 0838  SpO2 100 % 12/05/22 0838  Vitals shown include unfiled device data.  Last Pain:  Vitals:   12/05/22 0549  PainSc: 4          Complications: No notable events documented.

## 2022-12-05 NOTE — H&P (Signed)
Anesthesia H&P Update: History and Physical Exam reviewed; patient is OK for planned anesthetic and procedure. ? ?

## 2022-12-05 NOTE — H&P (Signed)
H&P documentation  -History and Physical Reviewed  -Patient has been re-examined  -No change in the plan of care  Kirsten Richardson  

## 2022-12-05 NOTE — Op Note (Signed)
12/05/2022  8:14 AM  PATIENT:  Kirsten Richardson  47 y.o. female  PRE-OPERATIVE DIAGNOSIS:  Lesion Floor of Mouth  POST-OPERATIVE DIAGNOSIS:  SAME  PROCEDURE:  Procedure(s): Excision Lesion Floor of Mouth   SURGEON:  Surgeon(s): Ocie Doyne, DMD  ANESTHESIA:   local and general  EBL:  minimal  DRAINS: none   SPECIMEN: lesion floor of mouth  COUNTS:  YES  PLAN OF CARE: Discharge to home after PACU  PATIENT DISPOSITION:  PACU - hemodynamically stable.   PROCEDURE DETAILS: Dictation #[id number]  [place] Lopes, DMD 12/05/2022 8:14 AM

## 2022-12-05 NOTE — Anesthesia Procedure Notes (Addendum)
Procedure Name: Intubation Date/Time: 12/05/2022 7:28 AM  Performed by: Georgianne Fick D, CRNAPre-anesthesia Checklist: Patient identified, Emergency Drugs available, Suction available and Patient being monitored Patient Re-evaluated:Patient Re-evaluated prior to induction Oxygen Delivery Method: Circle System Utilized Preoxygenation: Pre-oxygenation with 100% oxygen Induction Type: IV induction Ventilation: Mask ventilation without difficulty Laryngoscope Size: Mac and 3 Grade View: Grade I Nasal Tubes: Nasal Rae, Nasal prep performed, Magill forceps- large, utilized and Right Tube size: 6.5 mm Number of attempts: 1 Placement Confirmation: ETT inserted through vocal cords under direct vision, positive ETCO2 and breath sounds checked- equal and bilateral Tube secured with: Tape Dental Injury: Teeth and Oropharynx as per pre-operative assessment

## 2022-12-05 NOTE — Op Note (Signed)
Kirsten Richardson, Kirsten Richardson MEDICAL RECORD NO: [id number] ACCOUNT NO: [id number] DATE OF BIRTH: 01-Dec-1975 FACILITY: [facility] LOCATION: [facility] PHYSICIAN: Georgia Lopes, DDS  Operative Report   DATE OF PROCEDURE: 12/05/2022  PREOPERATIVE DIAGNOSIS:  Lesion, floor of the mouth.  POSTOPERATIVE DIAGNOSIS:  Lesion, floor of the mouth.  PROCEDURE:  Excision of lesion, floor of the mouth.  SURGEON:  Georgia Lopes, DDS  ANESTHESIA:  General, nasal intubation, Dr. Malen Gauze attending.  DESCRIPTION OF PROCEDURE:  The patient was taken to the operating room and placed on the table in the supine position. General anesthesia was administered.  Nasal endotracheal tube was placed and secured. The eyes were protected.  The patient was draped  for surgery. Timeout was performed. The posterior pharynx was suctioned and a throat pack was placed. 2% lidocaine 1:100,000 epinephrine was infiltrated in an inferior alveolar block on the right and left side and infiltration around the lesion. The  lesion was approximately 3 x 4 cm largely within the confines of the alveolar ridge in the floor of the mouth anterior tissue. The lesion had several folded areas and appeared to be epulis fissuratum based on past history. A #15 blade was used to make an  incision around the margins of the lesion through the lingual mucosal tissue.  Dissection was carried down with blunt scissors and a #15 blade until the lesion was mobilized and then the lesion was removed.  The Wharton's duct had been located prior to  incisions and the proximal incision line was distal to the [facility] duct for their protection. Then, the area was cauterized where any small bleeding vessels were and then closed with 3-0 chromic horizontal mattress and interrupted sutures. The oral  cavity was then irrigated and suctioned. The throat pack was removed. The patient was left under the care of anesthesia for extubation and transported to recovery with plans for  discharge home through day surgery.  ESTIMATED BLOOD LOSS:  Minimum.  COMPLICATIONS:  None.  SPECIMENS:  Lesion, anterior floor of the mouth.       PAA D: 12/05/2022 8:19:08 am T: 12/05/2022 8:37:00 am  JOB: [id number]/ [id number]

## 2022-12-06 ENCOUNTER — Encounter (HOSPITAL_COMMUNITY): Payer: Self-pay | Admitting: Oral Surgery

## 2023-01-08 ENCOUNTER — Ambulatory Visit
Admission: RE | Admit: 2023-01-08 | Discharge: 2023-01-08 | Disposition: A | Discharge status: Home / Self Care | Payer: 59 | Source: Ambulatory Visit | Attending: Certified Nurse Midwife | Admitting: Certified Nurse Midwife

## 2023-01-08 DIAGNOSIS — R921 Mammographic calcification found on diagnostic imaging of breast: Secondary | ICD-10-CM | POA: Diagnosis present

## 2023-01-29 ENCOUNTER — Other Ambulatory Visit: Payer: Self-pay | Admitting: Internal Medicine

## 2023-01-29 DIAGNOSIS — Z1231 Encounter for screening mammogram for malignant neoplasm of breast: Secondary | ICD-10-CM

## 2023-05-14 ENCOUNTER — Ambulatory Visit
Admission: RE | Admit: 2023-05-14 | Discharge: 2023-05-14 | Disposition: A | Discharge status: Home / Self Care | Payer: 59 | Source: Ambulatory Visit | Attending: Internal Medicine | Admitting: Internal Medicine

## 2023-05-14 DIAGNOSIS — Z1231 Encounter for screening mammogram for malignant neoplasm of breast: Secondary | ICD-10-CM | POA: Diagnosis present

## 2023-08-20 ENCOUNTER — Ambulatory Visit

## 2023-09-07 ENCOUNTER — Ambulatory Visit: Payer: 59 | Admitting: Dermatology

## 2023-10-30 ENCOUNTER — Other Ambulatory Visit: Payer: Self-pay | Admitting: Otolaryngology

## 2023-12-07 NOTE — Discharge Instructions (Signed)
 [facility] ENDOSCOPIC SINUS SURGERY [facility]  What is Functional Endoscopic Sinus Surgery?  The Surgery involves making the natural openings of the sinuses larger by removing the bony partitions that separate the sinuses from the nasal cavity.  The natural sinus lining is preserved as much as possible to allow the sinuses to resume normal function after the surgery.  In some patients nasal polyps (excessively swollen lining of the sinuses) may be removed to relieve obstruction of the sinus openings.  The surgery is performed through the nose using lighted scopes, which eliminates the need for incisions on the face.  A septoplasty is a different procedure which is sometimes performed with sinus surgery.  It involves straightening the boy partition that separates the two sides of your nose.  A crooked or deviated septum may need repair if is obstructing the sinuses or nasal airflow.  Turbinate reduction is also often performed during sinus surgery.  The turbinates are bony proturberances from the side walls of the nose which swell and can obstruct the nose in patients with sinus and allergy problems.  Their size can be surgically reduced to help relieve nasal obstruction.  What Can Sinus Surgery Do For Me?  Sinus surgery can reduce the frequency of sinus infections requiring antibiotic treatment.  This can provide improvement in nasal congestion, post-nasal drainage, facial pressure and nasal obstruction.  Surgery will NOT prevent you from ever having an infection again, so it usually only for patients who get infections 4 or more times yearly requiring antibiotics, or for infections that do not clear with antibiotics.  It will not cure nasal allergies, so patients with allergies may still require medication to treat their allergies after surgery. Surgery may improve headaches related to sinusitis, however, some people will continue to  require medication to control sinus headaches related to allergies.  Surgery will do nothing for other forms of headache (migraine, tension or cluster).  What Are the Risks of Endoscopic Sinus Surgery?  Current techniques allow surgery to be performed safely with little risk, however, there are rare complications that patients should be aware of.  Because the sinuses are located around the eyes, there is risk of eye injury, including blindness, though again, this would be quite rare. This is usually a result of bleeding behind the eye during surgery, which can effect vision, though there are treatments to protect the vision and prevent permanent injury. More serious complications would include bleeding inside the brain cavity or damage to the brain.This happens when the fluid around the brain leaks out into the sinus cavity.  Again, all of these complications are uncommon, and spinal fluid leaks can be safely managed surgically if they occur.  The most common complication of sinus surgery is bleeding from the nose, which may require packing or cauterization of the nose.  Patients with polyps may experience recurrence of the polyps that would require revision surgery.  Alterations of sense of smell or injury to the tear ducts are also rare complications.   What is the Surgery Like, and what is the Recovery?  The Surgery usually takes a couple of hours to perform, and is usually performed under a general anesthetic (completely asleep).  Patients are usually discharged home after a couple of hours.  Sometimes during surgery it is necessary to pack the nose to control bleeding, and the packing is left in place for 24 - 48 hours, and removed by your surgeon.  If  a septoplasty was performed during the procedure, there is often a splint placed which must be removed after 5-7 days.   Discomfort: Pain is usually mild to moderate, and can be controlled by prescription pain medication or acetaminophen (Tylenol).   Aspirin, Ibuprofen (Advil, Motrin), or Naprosyn (Aleve) should be avoided, as they can cause increased bleeding.  Most patients feel sinus pressure like they have a bad head cold for several days.  Sleeping with your head elevated can help reduce swelling and facial pressure, as can ice packs over the face.  A humidifier may be helpful to keep the mucous and blood from drying in the nose.   Diet: There are no specific diet restrictions, however, you should generally start with clear liquids and a light diet of bland foods because the anesthetic can cause some nausea.  Advance your diet depending on how your stomach feels.  Taking your pain medication with food will often help reduce stomach upset which pain medications can cause.  Nasal Saline Irrigation: It is important to remove blood clots and dried mucous from the nose as it is healing.  This is done by having you irrigate the nose at least 3 - 4 times daily with a salt water solution.  We recommend using NeilMed Sinus Rinse (available at the drug store).  Fill the squeeze bottle with the solution, bend over a sink, and insert the tip of the squeeze bottle into the nose  of an inch.  Point the tip of the squeeze bottle towards the inside corner of the eye on the same side your irrigating.  Squeeze the bottle and gently irrigate the nose.  If you bend forward as you do this, most of the fluid will flow back out of the nose, instead of down your throat.   The solution should be warm, near body temperature, when you irrigate.   Each time you irrigate, you should use a full squeeze bottle.   Note that if you are instructed to use Nasal Steroid Sprays at any time after your surgery, irrigate with saline BEFORE using the steroid spray, so you do not wash it all out of the nose. Another product, Nasal Saline Gel (such as AYR Nasal Saline Gel) can be applied in each nostril 3 - 4 times daily to moisture the nose and reduce scabbing or crusting.  Bleeding:   Bloody drainage from the nose can be expected for several days, and patients are instructed to irrigate their nose frequently with salt water to help remove mucous and blood clots.  The drainage may be dark red or brown, though some fresh blood may be seen intermittently, especially after irrigation.  Do not blow you nose, as bleeding may occur. If you must sneeze, keep your mouth open to allow air to escape through your mouth.  If heavy bleeding occurs: Irrigate the nose with saline to rinse out clots, then spray the nose 3 - 4 times with Afrin Nasal Decongestant Spray.  The spray will constrict the blood vessels to slow bleeding.  Pinch the lower half of your nose shut to apply pressure, and lay down with your head elevated.  Ice packs over the nose may help as well. If bleeding persists despite these measures, you should notify your doctor.  Do not use the Afrin routinely to control nasal congestion after surgery, as it can result in worsening congestion and may affect healing.     Activity: Return to work varies among patients. Most patients will be out  of work at least 5 - 7 days to recover.  Patient may return to work after they are off of narcotic pain medication, and feeling well enough to perform the functions of their job.  Patients must avoid heavy lifting (over 10 pounds) or strenuous physical for 2 weeks after surgery, so your employer may need to assign you to light duty, or keep you out of work longer if light duty is not possible.  NOTE: you should not drive, operate dangerous machinery, do any mentally demanding tasks or make any important legal or financial decisions while on narcotic pain medication and recovering from the general anesthetic.    Call Your Doctor Immediately if You Have Any of the Following: Bleeding that you cannot control with the above measures Loss of vision, double vision, bulging of the eye or black eyes. Fever over 101 degrees Neck stiffness with severe headache,  fever, nausea and change in mental state. You are always encouraged to call anytime with concerns, however, please call with requests for pain medication refills during office hours.  Office Endoscopy: During follow-up visits your doctor will remove any packing or splints that may have been placed and evaluate and clean your sinuses endoscopically.  Topical anesthetic will be used to make this as comfortable as possible, though you may want to take your pain medication prior to the visit.  How often this will need to be done varies from patient to patient.  After complete recovery from the surgery, you may need follow-up endoscopy from time to time, particularly if there is concern of recurrent infection or nasal polyps.

## 2023-12-10 ENCOUNTER — Ambulatory Visit: Admission: RE | Admit: 2023-12-10 | Source: Home / Self Care | Admitting: Otolaryngology

## 2023-12-10 ENCOUNTER — Encounter: Admission: RE | Payer: Self-pay | Source: Home / Self Care

## 2023-12-10 SURGERY — SEPTOPLASTY, NOSE
Anesthesia: General | Laterality: Bilateral
